# Patient Record
Sex: Female | Born: 1960 | Race: White | Hispanic: No | Smoking: Former smoker
Health system: Southern US, Community
[De-identification: ages and names within clinical notes are randomized; demographics above are authoritative.]

## PROBLEM LIST (undated history)

## (undated) DIAGNOSIS — R531 Weakness: Secondary | ICD-10-CM

## (undated) DIAGNOSIS — D649 Anemia, unspecified: Secondary | ICD-10-CM

## (undated) DIAGNOSIS — E119 Type 2 diabetes mellitus without complications: Secondary | ICD-10-CM

## (undated) DIAGNOSIS — C189 Malignant neoplasm of colon, unspecified: Secondary | ICD-10-CM

## (undated) DIAGNOSIS — M1990 Unspecified osteoarthritis, unspecified site: Secondary | ICD-10-CM

## (undated) DIAGNOSIS — K219 Gastro-esophageal reflux disease without esophagitis: Secondary | ICD-10-CM

## (undated) DIAGNOSIS — Z8709 Personal history of other diseases of the respiratory system: Secondary | ICD-10-CM

## (undated) DIAGNOSIS — E785 Hyperlipidemia, unspecified: Secondary | ICD-10-CM

## (undated) DIAGNOSIS — G8929 Other chronic pain: Secondary | ICD-10-CM

## (undated) DIAGNOSIS — I10 Essential (primary) hypertension: Secondary | ICD-10-CM

## (undated) DIAGNOSIS — K624 Stenosis of anus and rectum: Secondary | ICD-10-CM

## (undated) DIAGNOSIS — T7840XA Allergy, unspecified, initial encounter: Secondary | ICD-10-CM

## (undated) DIAGNOSIS — M549 Dorsalgia, unspecified: Secondary | ICD-10-CM

## (undated) HISTORY — PX: POLYPECTOMY: SHX149

## (undated) HISTORY — DX: Gastro-esophageal reflux disease without esophagitis: K21.9

## (undated) HISTORY — PX: TUBAL LIGATION: SHX77

## (undated) HISTORY — DX: Unspecified osteoarthritis, unspecified site: M19.90

## (undated) HISTORY — DX: Allergy, unspecified, initial encounter: T78.40XA

## (undated) HISTORY — PX: SIGMOIDOSCOPY: SUR1295

## (undated) HISTORY — PX: COLONOSCOPY: SHX174

## (undated) HISTORY — DX: Anemia, unspecified: D64.9

## (undated) HISTORY — DX: Essential (primary) hypertension: I10

---

## 1997-09-30 ENCOUNTER — Encounter: Admission: RE | Admit: 1997-09-30 | Discharge: 1997-09-30 | Payer: Self-pay | Admitting: Family Medicine

## 1998-06-19 ENCOUNTER — Encounter: Admission: RE | Admit: 1998-06-19 | Discharge: 1998-06-19 | Payer: Self-pay | Admitting: Family Medicine

## 1998-08-25 ENCOUNTER — Encounter: Admission: RE | Admit: 1998-08-25 | Discharge: 1998-08-25 | Payer: Self-pay | Admitting: Family Medicine

## 1998-10-20 ENCOUNTER — Encounter: Admission: RE | Admit: 1998-10-20 | Discharge: 1998-10-20 | Payer: Self-pay | Admitting: Family Medicine

## 1998-11-17 ENCOUNTER — Encounter: Admission: RE | Admit: 1998-11-17 | Discharge: 1998-11-17 | Payer: Self-pay | Admitting: Family Medicine

## 1998-11-24 ENCOUNTER — Encounter: Admission: RE | Admit: 1998-11-24 | Discharge: 1998-12-22 | Payer: Self-pay | Admitting: *Deleted

## 1998-12-08 ENCOUNTER — Encounter: Admission: RE | Admit: 1998-12-08 | Discharge: 1998-12-08 | Payer: Self-pay | Admitting: Family Medicine

## 1999-01-04 ENCOUNTER — Encounter: Admission: RE | Admit: 1999-01-04 | Discharge: 1999-01-04 | Payer: Self-pay | Admitting: Family Medicine

## 1999-02-08 ENCOUNTER — Encounter: Admission: RE | Admit: 1999-02-08 | Discharge: 1999-02-08 | Payer: Self-pay | Admitting: Family Medicine

## 1999-03-13 ENCOUNTER — Encounter: Admission: RE | Admit: 1999-03-13 | Discharge: 1999-03-13 | Payer: Self-pay | Admitting: Family Medicine

## 1999-04-20 ENCOUNTER — Encounter: Admission: RE | Admit: 1999-04-20 | Discharge: 1999-04-20 | Payer: Self-pay | Admitting: Family Medicine

## 1999-05-29 ENCOUNTER — Emergency Department (HOSPITAL_COMMUNITY): Admission: EM | Admit: 1999-05-29 | Discharge: 1999-05-29 | Payer: Self-pay | Admitting: Emergency Medicine

## 1999-06-01 ENCOUNTER — Encounter: Payer: Self-pay | Admitting: Sports Medicine

## 1999-06-01 ENCOUNTER — Encounter: Admission: RE | Admit: 1999-06-01 | Discharge: 1999-06-01 | Payer: Self-pay | Admitting: Sports Medicine

## 1999-08-02 ENCOUNTER — Encounter: Admission: RE | Admit: 1999-08-02 | Discharge: 1999-08-02 | Payer: Self-pay | Admitting: Family Medicine

## 1999-12-06 ENCOUNTER — Encounter: Admission: RE | Admit: 1999-12-06 | Discharge: 1999-12-06 | Payer: Self-pay | Admitting: Family Medicine

## 2000-02-07 ENCOUNTER — Encounter: Admission: RE | Admit: 2000-02-07 | Discharge: 2000-02-07 | Payer: Self-pay | Admitting: Family Medicine

## 2000-06-06 ENCOUNTER — Encounter: Admission: RE | Admit: 2000-06-06 | Discharge: 2000-06-06 | Payer: Self-pay | Admitting: Family Medicine

## 2000-06-06 ENCOUNTER — Encounter: Payer: Self-pay | Admitting: Family Medicine

## 2000-07-11 ENCOUNTER — Encounter: Admission: RE | Admit: 2000-07-11 | Discharge: 2000-07-11 | Payer: Self-pay | Admitting: Family Medicine

## 2000-07-13 ENCOUNTER — Emergency Department (HOSPITAL_COMMUNITY): Admission: EM | Admit: 2000-07-13 | Discharge: 2000-07-13 | Payer: Self-pay

## 2000-08-05 ENCOUNTER — Encounter: Admission: RE | Admit: 2000-08-05 | Discharge: 2000-08-05 | Payer: Self-pay | Admitting: Sports Medicine

## 2000-08-29 ENCOUNTER — Encounter: Admission: RE | Admit: 2000-08-29 | Discharge: 2000-08-29 | Payer: Self-pay | Admitting: Family Medicine

## 2000-09-03 ENCOUNTER — Encounter: Admission: RE | Admit: 2000-09-03 | Discharge: 2000-09-03 | Payer: Self-pay | Admitting: Family Medicine

## 2000-10-07 ENCOUNTER — Encounter: Admission: RE | Admit: 2000-10-07 | Discharge: 2000-10-07 | Payer: Self-pay | Admitting: Sports Medicine

## 2000-10-31 ENCOUNTER — Encounter: Admission: RE | Admit: 2000-10-31 | Discharge: 2000-10-31 | Payer: Self-pay | Admitting: Family Medicine

## 2000-11-04 ENCOUNTER — Emergency Department (HOSPITAL_COMMUNITY): Admission: EM | Admit: 2000-11-04 | Discharge: 2000-11-05 | Payer: Self-pay | Admitting: Emergency Medicine

## 2000-11-12 ENCOUNTER — Encounter: Payer: Self-pay | Admitting: Neurosurgery

## 2000-11-12 ENCOUNTER — Ambulatory Visit (HOSPITAL_COMMUNITY): Admission: RE | Admit: 2000-11-12 | Discharge: 2000-11-12 | Payer: Self-pay | Admitting: Neurosurgery

## 2000-11-25 ENCOUNTER — Encounter: Admission: RE | Admit: 2000-11-25 | Discharge: 2000-11-25 | Payer: Self-pay | Admitting: Pediatrics

## 2000-12-04 ENCOUNTER — Encounter: Admission: RE | Admit: 2000-12-04 | Discharge: 2000-12-04 | Payer: Self-pay | Admitting: Family Medicine

## 2000-12-25 ENCOUNTER — Encounter: Admission: RE | Admit: 2000-12-25 | Discharge: 2000-12-25 | Payer: Self-pay | Admitting: Family Medicine

## 2001-01-26 ENCOUNTER — Encounter: Admission: RE | Admit: 2001-01-26 | Discharge: 2001-01-26 | Payer: Self-pay | Admitting: Family Medicine

## 2001-02-17 ENCOUNTER — Encounter: Admission: RE | Admit: 2001-02-17 | Discharge: 2001-02-17 | Payer: Self-pay | Admitting: Family Medicine

## 2001-03-12 ENCOUNTER — Encounter: Admission: RE | Admit: 2001-03-12 | Discharge: 2001-03-12 | Payer: Self-pay | Admitting: Sports Medicine

## 2001-04-23 ENCOUNTER — Encounter: Admission: RE | Admit: 2001-04-23 | Discharge: 2001-04-23 | Payer: Self-pay | Admitting: Family Medicine

## 2001-06-12 ENCOUNTER — Encounter: Payer: Self-pay | Admitting: Family Medicine

## 2001-06-12 ENCOUNTER — Encounter: Admission: RE | Admit: 2001-06-12 | Discharge: 2001-06-12 | Payer: Self-pay | Admitting: Family Medicine

## 2001-07-23 ENCOUNTER — Encounter: Admission: RE | Admit: 2001-07-23 | Discharge: 2001-07-23 | Payer: Self-pay | Admitting: Family Medicine

## 2001-09-14 ENCOUNTER — Encounter: Admission: RE | Admit: 2001-09-14 | Discharge: 2001-09-14 | Payer: Self-pay | Admitting: Family Medicine

## 2002-09-23 ENCOUNTER — Encounter: Admission: RE | Admit: 2002-09-23 | Discharge: 2002-09-23 | Payer: Self-pay | Admitting: Family Medicine

## 2002-10-01 ENCOUNTER — Encounter: Admission: RE | Admit: 2002-10-01 | Discharge: 2002-10-01 | Payer: Self-pay | Admitting: Family Medicine

## 2002-11-10 ENCOUNTER — Encounter: Admission: RE | Admit: 2002-11-10 | Discharge: 2002-11-10 | Payer: Self-pay | Admitting: Family Medicine

## 2003-01-28 ENCOUNTER — Encounter: Admission: RE | Admit: 2003-01-28 | Discharge: 2003-01-28 | Payer: Self-pay | Admitting: Family Medicine

## 2003-07-22 ENCOUNTER — Encounter: Admission: RE | Admit: 2003-07-22 | Discharge: 2003-07-22 | Payer: Self-pay | Admitting: Family Medicine

## 2003-08-26 ENCOUNTER — Encounter: Admission: RE | Admit: 2003-08-26 | Discharge: 2003-08-26 | Payer: Self-pay | Admitting: Internal Medicine

## 2003-11-24 ENCOUNTER — Encounter: Admission: RE | Admit: 2003-11-24 | Discharge: 2003-11-24 | Payer: Self-pay | Admitting: Family Medicine

## 2004-09-28 ENCOUNTER — Ambulatory Visit: Payer: Self-pay | Admitting: Family Medicine

## 2004-10-19 ENCOUNTER — Encounter: Admission: RE | Admit: 2004-10-19 | Discharge: 2004-10-19 | Payer: Self-pay | Admitting: Sports Medicine

## 2005-09-15 ENCOUNTER — Encounter (INDEPENDENT_AMBULATORY_CARE_PROVIDER_SITE_OTHER): Payer: Self-pay | Admitting: *Deleted

## 2005-09-15 LAB — CONVERTED CEMR LAB

## 2005-09-16 ENCOUNTER — Ambulatory Visit: Payer: Self-pay | Admitting: Family Medicine

## 2005-11-01 ENCOUNTER — Encounter: Admission: RE | Admit: 2005-11-01 | Discharge: 2005-11-01 | Payer: Self-pay | Admitting: Sports Medicine

## 2006-03-25 ENCOUNTER — Ambulatory Visit: Payer: Self-pay | Admitting: Family Medicine

## 2006-08-14 DIAGNOSIS — K219 Gastro-esophageal reflux disease without esophagitis: Secondary | ICD-10-CM

## 2006-08-14 DIAGNOSIS — M545 Low back pain: Secondary | ICD-10-CM

## 2006-08-15 ENCOUNTER — Encounter (INDEPENDENT_AMBULATORY_CARE_PROVIDER_SITE_OTHER): Payer: Self-pay | Admitting: *Deleted

## 2006-09-26 ENCOUNTER — Ambulatory Visit: Payer: Self-pay | Admitting: Family Medicine

## 2006-09-26 DIAGNOSIS — E781 Pure hyperglyceridemia: Secondary | ICD-10-CM

## 2006-10-08 ENCOUNTER — Encounter: Payer: Self-pay | Admitting: Family Medicine

## 2006-10-08 ENCOUNTER — Ambulatory Visit: Payer: Self-pay | Admitting: Sports Medicine

## 2006-10-08 LAB — CONVERTED CEMR LAB
ALT: 24 units/L (ref 0–35)
AST: 16 units/L (ref 0–37)
Albumin: 4.2 g/dL (ref 3.5–5.2)
Alkaline Phosphatase: 65 units/L (ref 39–117)
BUN: 13 mg/dL (ref 6–23)
CO2: 24 meq/L (ref 19–32)
Calcium: 9.5 mg/dL (ref 8.4–10.5)
Chloride: 106 meq/L (ref 96–112)
Cholesterol: 178 mg/dL (ref 0–200)
Creatinine, Ser: 0.71 mg/dL (ref 0.40–1.20)
Glucose, Bld: 100 mg/dL — ABNORMAL HIGH (ref 70–99)
HDL: 36 mg/dL — ABNORMAL LOW (ref >39)
LDL Cholesterol: 83 mg/dL (ref 0–99)
Potassium: 4.3 meq/L (ref 3.5–5.3)
Sodium: 142 meq/L (ref 135–145)
Total Bilirubin: 0.4 mg/dL (ref 0.3–1.2)
Total CHOL/HDL Ratio: 4.9
Total Protein: 7.1 g/dL (ref 6.0–8.3)
Triglycerides: 295 mg/dL — ABNORMAL HIGH (ref <150)
VLDL: 59 mg/dL — ABNORMAL HIGH (ref 0–40)

## 2006-11-07 ENCOUNTER — Encounter: Admission: RE | Admit: 2006-11-07 | Discharge: 2006-11-07 | Payer: Self-pay | Admitting: Sports Medicine

## 2006-11-07 ENCOUNTER — Encounter: Payer: Self-pay | Admitting: Family Medicine

## 2006-12-18 ENCOUNTER — Telehealth: Payer: Self-pay | Admitting: *Deleted

## 2006-12-22 ENCOUNTER — Ambulatory Visit: Payer: Self-pay | Admitting: Sports Medicine

## 2006-12-22 DIAGNOSIS — J309 Allergic rhinitis, unspecified: Secondary | ICD-10-CM | POA: Insufficient documentation

## 2007-01-02 ENCOUNTER — Ambulatory Visit: Payer: Self-pay | Admitting: Family Medicine

## 2007-02-09 ENCOUNTER — Ambulatory Visit: Payer: Self-pay | Admitting: Family Medicine

## 2007-02-09 ENCOUNTER — Telehealth (INDEPENDENT_AMBULATORY_CARE_PROVIDER_SITE_OTHER): Payer: Self-pay | Admitting: *Deleted

## 2007-02-09 ENCOUNTER — Encounter (INDEPENDENT_AMBULATORY_CARE_PROVIDER_SITE_OTHER): Payer: Self-pay | Admitting: *Deleted

## 2007-10-01 ENCOUNTER — Encounter: Payer: Self-pay | Admitting: Family Medicine

## 2007-10-09 ENCOUNTER — Encounter: Payer: Self-pay | Admitting: Family Medicine

## 2007-10-09 ENCOUNTER — Ambulatory Visit: Payer: Self-pay | Admitting: Family Medicine

## 2007-10-09 DIAGNOSIS — D260 Other benign neoplasm of cervix uteri: Secondary | ICD-10-CM | POA: Insufficient documentation

## 2007-10-09 LAB — CONVERTED CEMR LAB
ALT: 15 units/L (ref 0–35)
AST: 15 units/L (ref 0–37)
Albumin: 3.9 g/dL (ref 3.5–5.2)
Alkaline Phosphatase: 72 units/L (ref 39–117)
BUN: 10 mg/dL (ref 6–23)
CO2: 25 meq/L (ref 19–32)
Calcium: 9.3 mg/dL (ref 8.4–10.5)
Chloride: 103 meq/L (ref 96–112)
Cholesterol: 183 mg/dL (ref 0–200)
Creatinine, Ser: 0.65 mg/dL (ref 0.40–1.20)
Glucose, Bld: 73 mg/dL (ref 70–99)
HDL: 36 mg/dL — ABNORMAL LOW (ref >39)
LDL Cholesterol: 79 mg/dL (ref 0–99)
Pap Smear: NORMAL
Potassium: 4.2 meq/L (ref 3.5–5.3)
Sodium: 138 meq/L (ref 135–145)
Total Bilirubin: 0.3 mg/dL (ref 0.3–1.2)
Total CHOL/HDL Ratio: 5.1
Total Protein: 7.2 g/dL (ref 6.0–8.3)
Triglycerides: 340 mg/dL — ABNORMAL HIGH (ref <150)
VLDL: 68 mg/dL — ABNORMAL HIGH (ref 0–40)

## 2007-10-13 ENCOUNTER — Encounter: Payer: Self-pay | Admitting: Family Medicine

## 2007-10-14 ENCOUNTER — Telehealth: Payer: Self-pay | Admitting: *Deleted

## 2007-11-12 ENCOUNTER — Encounter: Admission: RE | Admit: 2007-11-12 | Discharge: 2007-11-12 | Payer: Self-pay | Admitting: Family Medicine

## 2007-11-20 ENCOUNTER — Ambulatory Visit: Payer: Self-pay | Admitting: Family Medicine

## 2007-11-20 DIAGNOSIS — I10 Essential (primary) hypertension: Secondary | ICD-10-CM

## 2007-11-25 ENCOUNTER — Telehealth: Payer: Self-pay | Admitting: *Deleted

## 2007-11-25 ENCOUNTER — Ambulatory Visit: Payer: Self-pay | Admitting: Family Medicine

## 2007-11-25 ENCOUNTER — Encounter (INDEPENDENT_AMBULATORY_CARE_PROVIDER_SITE_OTHER): Payer: Self-pay | Admitting: *Deleted

## 2007-12-24 ENCOUNTER — Ambulatory Visit: Payer: Self-pay | Admitting: Family Medicine

## 2007-12-24 ENCOUNTER — Encounter: Payer: Self-pay | Admitting: Family Medicine

## 2007-12-24 LAB — CONVERTED CEMR LAB
BUN: 17 mg/dL (ref 6–23)
CO2: 26 meq/L (ref 19–32)
Calcium: 9.5 mg/dL (ref 8.4–10.5)
Chloride: 102 meq/L (ref 96–112)
Creatinine, Ser: 0.85 mg/dL (ref 0.40–1.20)
Glucose, Bld: 95 mg/dL (ref 70–99)
Potassium: 3.6 meq/L (ref 3.5–5.3)
Sodium: 139 meq/L (ref 135–145)

## 2008-01-07 ENCOUNTER — Telehealth: Payer: Self-pay | Admitting: *Deleted

## 2008-01-07 ENCOUNTER — Ambulatory Visit: Payer: Self-pay | Admitting: Gynecology

## 2008-01-11 ENCOUNTER — Ambulatory Visit: Payer: Self-pay | Admitting: Family Medicine

## 2008-01-11 LAB — CONVERTED CEMR LAB: Rapid Strep: NEGATIVE

## 2008-03-24 ENCOUNTER — Ambulatory Visit: Payer: Self-pay | Admitting: Family Medicine

## 2008-03-24 ENCOUNTER — Encounter: Payer: Self-pay | Admitting: Family Medicine

## 2008-03-24 LAB — CONVERTED CEMR LAB
HDL: 40 mg/dL (ref >39)
LDL Cholesterol: 99 mg/dL (ref 0–99)
Triglycerides: 176 mg/dL — ABNORMAL HIGH (ref <150)

## 2008-04-13 ENCOUNTER — Encounter: Payer: Self-pay | Admitting: Family Medicine

## 2008-05-23 ENCOUNTER — Ambulatory Visit: Payer: Self-pay | Admitting: Family Medicine

## 2008-05-23 ENCOUNTER — Encounter: Payer: Self-pay | Admitting: Family Medicine

## 2008-05-23 ENCOUNTER — Telehealth: Payer: Self-pay | Admitting: *Deleted

## 2008-07-21 ENCOUNTER — Telehealth (INDEPENDENT_AMBULATORY_CARE_PROVIDER_SITE_OTHER): Payer: Self-pay | Admitting: *Deleted

## 2008-08-18 ENCOUNTER — Encounter: Payer: Self-pay | Admitting: Family Medicine

## 2008-08-18 ENCOUNTER — Ambulatory Visit: Payer: Self-pay | Admitting: Family Medicine

## 2008-08-18 LAB — CONVERTED CEMR LAB
CO2: 25 meq/L (ref 19–32)
Calcium: 9.8 mg/dL (ref 8.4–10.5)
Chloride: 102 meq/L (ref 96–112)
Glucose, Bld: 84 mg/dL (ref 70–99)
Sodium: 140 meq/L (ref 135–145)

## 2008-08-19 ENCOUNTER — Telehealth (INDEPENDENT_AMBULATORY_CARE_PROVIDER_SITE_OTHER): Payer: Self-pay | Admitting: *Deleted

## 2008-10-27 ENCOUNTER — Ambulatory Visit: Payer: Self-pay | Admitting: Family Medicine

## 2008-10-27 ENCOUNTER — Encounter: Payer: Self-pay | Admitting: Family Medicine

## 2008-10-27 ENCOUNTER — Encounter (INDEPENDENT_AMBULATORY_CARE_PROVIDER_SITE_OTHER): Payer: Self-pay | Admitting: Family Medicine

## 2008-10-27 LAB — CONVERTED CEMR LAB
BUN: 18 mg/dL (ref 6–23)
CO2: 26 meq/L (ref 19–32)
Calcium: 9.8 mg/dL (ref 8.4–10.5)
Chloride: 103 meq/L (ref 96–112)
Creatinine, Ser: 0.79 mg/dL (ref 0.40–1.20)

## 2008-10-28 ENCOUNTER — Encounter: Payer: Self-pay | Admitting: Family Medicine

## 2008-10-31 ENCOUNTER — Encounter: Payer: Self-pay | Admitting: Family Medicine

## 2008-11-24 ENCOUNTER — Encounter: Admission: RE | Admit: 2008-11-24 | Discharge: 2008-11-24 | Payer: Self-pay | Admitting: Family Medicine

## 2009-01-02 ENCOUNTER — Ambulatory Visit: Payer: Self-pay | Admitting: Family Medicine

## 2009-01-03 ENCOUNTER — Telehealth: Payer: Self-pay | Admitting: Family Medicine

## 2009-01-05 ENCOUNTER — Telehealth: Payer: Self-pay | Admitting: Family Medicine

## 2009-04-10 ENCOUNTER — Emergency Department (HOSPITAL_COMMUNITY): Admission: EM | Admit: 2009-04-10 | Discharge: 2009-04-11 | Payer: Self-pay | Admitting: Emergency Medicine

## 2009-04-27 ENCOUNTER — Ambulatory Visit: Payer: Self-pay | Admitting: Family Medicine

## 2009-04-28 ENCOUNTER — Ambulatory Visit: Payer: Self-pay | Admitting: Family Medicine

## 2009-04-28 LAB — CONVERTED CEMR LAB
Bilirubin Urine: NEGATIVE
Glucose, Urine, Semiquant: NEGATIVE
Ketones, urine, test strip: NEGATIVE
Nitrite: NEGATIVE
Protein, U semiquant: NEGATIVE
Urobilinogen, UA: 0.2

## 2009-06-17 HISTORY — PX: ABDOMINAL HYSTERECTOMY: SHX81

## 2009-11-09 ENCOUNTER — Ambulatory Visit: Payer: Self-pay | Admitting: Family Medicine

## 2009-11-09 ENCOUNTER — Telehealth (INDEPENDENT_AMBULATORY_CARE_PROVIDER_SITE_OTHER): Payer: Self-pay | Admitting: *Deleted

## 2009-11-23 ENCOUNTER — Encounter: Payer: Self-pay | Admitting: Family Medicine

## 2009-11-23 ENCOUNTER — Ambulatory Visit: Payer: Self-pay | Admitting: Family Medicine

## 2009-11-30 ENCOUNTER — Encounter: Admission: RE | Admit: 2009-11-30 | Discharge: 2009-11-30 | Payer: Self-pay | Admitting: Family Medicine

## 2009-12-04 ENCOUNTER — Encounter: Payer: Self-pay | Admitting: Family Medicine

## 2009-12-04 LAB — CONVERTED CEMR LAB
ALT: 16 units/L (ref 0–35)
Albumin: 4.2 g/dL (ref 3.5–5.2)
CO2: 26 meq/L (ref 19–32)
Chloride: 103 meq/L (ref 96–112)
Cholesterol: 184 mg/dL (ref 0–200)
Glucose, Bld: 101 mg/dL — ABNORMAL HIGH (ref 70–99)
Potassium: 3.9 meq/L (ref 3.5–5.3)
Sodium: 140 meq/L (ref 135–145)
Total Bilirubin: 0.3 mg/dL (ref 0.3–1.2)
Total Protein: 7.1 g/dL (ref 6.0–8.3)
Triglycerides: 245 mg/dL — ABNORMAL HIGH (ref <150)
VLDL: 49 mg/dL — ABNORMAL HIGH (ref 0–40)

## 2009-12-07 ENCOUNTER — Telehealth: Payer: Self-pay | Admitting: Family Medicine

## 2010-01-29 ENCOUNTER — Telehealth: Payer: Self-pay | Admitting: Family Medicine

## 2010-01-30 ENCOUNTER — Ambulatory Visit: Payer: Self-pay | Admitting: Family Medicine

## 2010-01-30 ENCOUNTER — Encounter: Payer: Self-pay | Admitting: Family Medicine

## 2010-02-16 ENCOUNTER — Ambulatory Visit: Payer: Self-pay | Admitting: Family Medicine

## 2010-02-16 DIAGNOSIS — N950 Postmenopausal bleeding: Secondary | ICD-10-CM

## 2010-03-01 ENCOUNTER — Encounter: Payer: Self-pay | Admitting: Family Medicine

## 2010-03-01 ENCOUNTER — Ambulatory Visit: Payer: Self-pay | Admitting: Family Medicine

## 2010-03-08 ENCOUNTER — Ambulatory Visit (HOSPITAL_COMMUNITY): Admission: RE | Admit: 2010-03-08 | Discharge: 2010-03-08 | Payer: Self-pay | Admitting: Family Medicine

## 2010-03-12 ENCOUNTER — Encounter: Payer: Self-pay | Admitting: Family Medicine

## 2010-03-13 ENCOUNTER — Ambulatory Visit: Payer: Self-pay | Admitting: Family Medicine

## 2010-03-13 ENCOUNTER — Encounter: Payer: Self-pay | Admitting: *Deleted

## 2010-03-13 ENCOUNTER — Encounter: Payer: Self-pay | Admitting: Sports Medicine

## 2010-03-13 ENCOUNTER — Telehealth: Payer: Self-pay | Admitting: Family Medicine

## 2010-03-13 DIAGNOSIS — R9389 Abnormal findings on diagnostic imaging of other specified body structures: Secondary | ICD-10-CM | POA: Insufficient documentation

## 2010-03-13 LAB — CONVERTED CEMR LAB: Rapid Strep: NEGATIVE

## 2010-03-23 ENCOUNTER — Telehealth: Payer: Self-pay | Admitting: Family Medicine

## 2010-03-30 ENCOUNTER — Ambulatory Visit: Payer: Self-pay | Admitting: Family Medicine

## 2010-04-13 ENCOUNTER — Ambulatory Visit: Payer: Self-pay | Admitting: Family Medicine

## 2010-04-13 ENCOUNTER — Ambulatory Visit (HOSPITAL_COMMUNITY): Admission: RE | Admit: 2010-04-13 | Discharge: 2010-04-13 | Payer: Self-pay | Admitting: Family Medicine

## 2010-04-26 ENCOUNTER — Ambulatory Visit: Payer: Self-pay | Admitting: Family Medicine

## 2010-05-18 ENCOUNTER — Ambulatory Visit: Payer: Self-pay | Admitting: Family Medicine

## 2010-06-21 LAB — CBC
HCT: 40.9 % (ref 36.0–46.0)
Hemoglobin: 13.8 g/dL (ref 12.0–15.0)
MCH: 28.9 pg (ref 26.0–34.0)
MCHC: 33.7 g/dL (ref 30.0–36.0)
MCV: 85.7 fL (ref 78.0–100.0)
Platelets: 323 10*3/uL (ref 150–400)
RBC: 4.77 MIL/uL (ref 3.87–5.11)
RDW: 12.6 % (ref 11.5–15.5)
WBC: 10.2 10*3/uL (ref 4.0–10.5)

## 2010-06-21 LAB — BASIC METABOLIC PANEL
BUN: 13 mg/dL (ref 6–23)
CO2: 29 mEq/L (ref 19–32)
Calcium: 9.6 mg/dL (ref 8.4–10.5)
Chloride: 101 mEq/L (ref 96–112)
Creatinine, Ser: 0.53 mg/dL (ref 0.4–1.2)
GFR calc Af Amer: >60 mL/min (ref >60)
GFR calc non Af Amer: >60 mL/min (ref >60)
Glucose, Bld: 97 mg/dL (ref 70–99)
Potassium: 3.4 mEq/L — ABNORMAL LOW (ref 3.5–5.1)
Sodium: 137 mEq/L (ref 135–145)

## 2010-06-28 ENCOUNTER — Ambulatory Visit (HOSPITAL_COMMUNITY)
Admission: RE | Admit: 2010-06-28 | Discharge: 2010-06-29 | Payer: Self-pay | Source: Home / Self Care | Attending: Family Medicine | Admitting: Family Medicine

## 2010-06-28 ENCOUNTER — Encounter: Payer: Self-pay | Admitting: Family Medicine

## 2010-07-02 LAB — CBC
HCT: 35.8 % — ABNORMAL LOW (ref 36.0–46.0)
Hemoglobin: 11.6 g/dL — ABNORMAL LOW (ref 12.0–15.0)
MCH: 28.2 pg (ref 26.0–34.0)
MCHC: 32.4 g/dL (ref 30.0–36.0)
MCV: 87.1 fL (ref 78.0–100.0)
Platelets: 286 10*3/uL (ref 150–400)
RBC: 4.11 MIL/uL (ref 3.87–5.11)
RDW: 12.8 % (ref 11.5–15.5)
WBC: 12.6 10*3/uL — ABNORMAL HIGH (ref 4.0–10.5)

## 2010-07-02 LAB — PREGNANCY, URINE: Preg Test, Ur: NEGATIVE

## 2010-07-09 NOTE — Discharge Summary (Signed)
  Wanda Baker, KHALID                ACCOUNT NO.:  [id number]  MEDICAL RECORD NO.:  [id number]          PATIENT TYPE:  OIB  LOCATION:  9317                          [facility]  PHYSICIAN:  Ashelynn Marks S. Shawnie Pons, M.D.   DATE OF BIRTH:  1960/07/18  DATE OF ADMISSION:  06/28/2010 DATE OF DISCHARGE:  06/29/2010                              DISCHARGE SUMMARY   FINAL DIAGNOSES: 1. Postmenopausal bleeding secondary to large submucosal fibroid. 2. Hypertension. 3. Allergic rhinitis. 4. Elevated triglycerides. 5. Low-back pain. 6. Gastroesophageal reflux disease. 7. Arthritis.  PERTINENT LABORATORIES:  Hemoglobin of 13.8 preoperatively, 11.6 postoperatively, mildly low potassium of 3.4, normal creatinine 0.5. Pregnancy test was negative.  Other findings include a small uterus with a negative D and C at the time of hysteroscopy last month.  REASON FOR ADMISSION:  Briefly, please see H and P on the chart.  The patient is a 50 year old para 2, who was referred for postmenopausal bleeding and inability to get sample in the office.  She underwent D and C hysteroscopy in October which showed a large vascular fibroid that was not amenable to resection.  After lengthy discussion was held with the patient including risks and benefits, the patient had decided for a laparoscopic supracervical hysterectomy.  PROCEDURE:  Laparoscopic supracervical hysterectomy.  HOSPITAL COURSE:  The patient was admitted to undergo the above procedure.  Procedure went well.  Postoperatively, she was transferred to the floor.  She was afebrile.  She was voiding without difficulty. She was ambulating.  She had passed flatus.  She was tolerating regular diet on postoperative day #1.  It was felt she was stable for discharge. Pain was well controlled.  DISCHARGE DISPOSITION AND CONDITION:  The patient was discharged home in good condition.  Follow up will be in the GYN Clinic on January 26 at 2 p.m.  DISCHARGE  MEDICATIONS: 1. Resumption of lisinopril 5 mg daily. 2. Hydrochlorothiazide 25 mg daily. 3. Zyrtec 10 mg as needed for allergies. 4. Gabapentin 600 mg as needed for pain. 5. Cimetidine 400 mg twice daily. 6. Tylenol p.r.n. not to be taken with her new prescription med of     Percocet 5/325 one to two p.o. q.4-6 h. p.r.n. pain.  The patient was instructed after several days she probably can go over to ibuprofen as needed for pain and stop all Percocet.  The patient was also instructed to return with severe abdominal pain, persistent nausea, vomiting, fever, chills, especially greater than 101.  She was also instructed that right shoulder pain, this is normal.  The patient was given a prescription for Percocet #42.     Shelbie Proctor. Shawnie Pons, M.D.     TSP/MEDQ  D:  06/29/2010  T:  06/30/2010  Job:  [id number]  Electronically Signed by Tinnie Gens M.D. on 07/09/2010 12:00:51 PM

## 2010-07-09 NOTE — Op Note (Signed)
NAMEAZHA, CONSTANTIN                ACCOUNT NO.:  [id number]  MEDICAL RECORD NO.:  [id number]          PATIENT TYPE:  OIB  LOCATION:  9317                          [facility]  PHYSICIAN:  Geneveive Furness S. Shawnie Pons, M.D.   DATE OF BIRTH:  21-Feb-1961  DATE OF PROCEDURE:  06/28/2010 DATE OF DISCHARGE:                              OPERATIVE REPORT   PREOPERATIVE DIAGNOSES:  Postmenopausal bleeding and submucosal fibroid.  POSTOPERATIVE DIAGNOSES:  Postmenopausal bleeding and submucosal fibroid.  PROCEDURE:  Laparoscopic supracervical hysterectomy.  SURGEON:  Shelbie Proctor. Shawnie Pons, M.D.  ASSISTANT:  Lazaro Arms, M.D. and Allie Bossier, MD.  ANESTHESIA:  General local.  FINDINGS:  Adhesions of omentum to anterior abdominal wall and round ligament.  Otherwise, normal-appearing uterus, adhesions of the bladder to the lower uterine segment.  SPECIMENS:  Uterus and a portion of endocervix to Pathology.  ESTIMATED BLOOD LOSS:  100 mL.  COMPLICATIONS:  None known.  REASON FOR PROCEDURE:  Briefly, the patient is a 50 year old para 2 who had two prior cesarean deliveries who had postmenopausal bleeding.  She underwent D and C, hysteroscopy which showed a very large submucosal fibroid that was not amenable to hysteroscopically resection.  For that reason, she was counseled about risks and benefits and decided for supracervical hysterectomy.  The patient was also notified the risks included but were not limited to injury to bowel, bladder, ureters, bleeding, infection.  PROCEDURE:  The patient was taken to the OR where she was placed in dorsal lithotomy in [place] stirrups.  She was prepped and draped in the usual sterile fashion.  Foley catheter was placed inside her bladder.  A speculum was placed inside the vagina.  The cervix was grasped anteriorly with a single-tooth tenaculum and a cervix manipulator placed and attached to the single-tooth tenaculum.  Attention was then turned to the  abdomen.  Approximately 4 mL of 0.25% Marcaine were injected by the umbilicus, a knife was used to make an incision through the skin. The OptiVu scope with the scope attached was used to enter the peritoneal cavity.  Pneumoperitoneum was then created.  There were adhesions immediately noted that had to be gone around to look into the pelvis.  At this point, two 11-mm Xcel ports were placed in the lower quadrants bilaterally under direct visualization after injecting with 0.25% Marcaine.  These adhesions were taken down bluntly with minimal bleeding noted.  The uterus was then lifted up and a single-tooth tenaculum used on the cornu of the uterus and the harmonic scalpel used to take the patient's left round and left tubo-ovarian pedicles.  A bladder flap was created from the side.  The patient's round and tubo- ovarian pedicle were then taken down on the patient's right side and the bladder flap connected.  The bladder did not come down very well off the uterus, so care was taken to avoid being near it.  The patient's right uterine vessel was then cauterized with the harmonic scalpel and incised.  The patient's right uterine vessel was also done and cauterized with the harmonic scalpel.  The uterus was then blanched very well  and drilling started to take place on the lower uterine segment and into the endocervix.  Until the uterus was removed, several bites were taken from the patient's right followed by bites taken from the patient's left.  Once this was taken out, a little bit of the endocervix was cored out using the harmonic in a drilling-type method and this was removed.  The 11-mm port was then removed on the patient's left side and the morcellator placed through this incision.  The uterus was then morcellated and removed from the abdomen.  Any extra pieces were removed easily.  Irrigation was used on the pelvis and was felt to be hemostatic.  A piece of Interceed was placed over the  cervical stump. The morcellator was removed from thepatient's left side and an autosuture was used to close the fascial defect.  An Xcel trocar was used on the patient's right side and so the fascial defect was not  here and did not have to be closed.  The skin was closed  with 3-0 Vicryl in a subcuticular fashion in all ports. Dermabond was placed over these and then followed by sterile dressing. All instrument and lap counts were correct x2.  The single-tooth tenaculum and cervical manipulator were removed from the vagina.  The patient was awakened and taken to the recovery room in stable condition.     Shelbie Proctor. Shawnie Pons, M.D.     TSP/MEDQ  D:  06/28/2010  T:  06/29/2010  Job:  [id number]  Electronically Signed by Tinnie Gens M.D. on 07/09/2010 12:04:04 PM

## 2010-07-12 ENCOUNTER — Ambulatory Visit
Admission: RE | Admit: 2010-07-12 | Discharge: 2010-07-12 | Payer: Self-pay | Source: Home / Self Care | Attending: Family Medicine | Admitting: Family Medicine

## 2010-07-12 ENCOUNTER — Telehealth: Payer: Self-pay | Admitting: *Deleted

## 2010-07-12 LAB — SURGICAL PCR SCREEN
MRSA, PCR: NEGATIVE
Staphylococcus aureus: NEGATIVE

## 2010-07-13 NOTE — Progress Notes (Signed)
NAMEAMBERLYN, MARTINEZGARCIA NO.:  [id number]  MEDICAL RECORD NO.:  [id number]          PATIENT TYPE:  WOC  LOCATION:  [facility]                   FACILITY:  [facility]  PHYSICIAN:  Allie Bossier, MD        DATE OF BIRTH:  09-May-1961  DATE OF SERVICE:  07/12/2010                                 CLINIC NOTE  Ms. Rockford is a 50 year old lady who is now 2 weeks, status post laparoscopic supracervical hysterectomy.  She has no complaints.  Her bowel and bladder function has returned to normal.  She has quit taking her Percocet.  She does mention that she has had an increased amount of belching and she gets followed for GERD at the [facility].  On exam, her incisions are well-healed.  Her abdomen is benign.  Her speculum exam shows a normal cervix.  ASSESSMENT AND PLAN:  Postoperatively doing well.  She may return to work.  She prefers a 6 weeks postop, so I have written a note for this. She gets her Pap smears done every May with her family doctor and I recommended that she continue with annual Pap smears.     Allie Bossier, MD    MCD/MEDQ  D:  07/12/2010  T:  07/13/2010  Job:  [id number]

## 2010-07-16 ENCOUNTER — Telehealth: Payer: Self-pay | Admitting: *Deleted

## 2010-07-17 NOTE — Progress Notes (Signed)
Summary: phn msg  Phone Note Call from Patient Call back at Home Phone [phone]   Caller: Patient Summary of Call: Pt was unable to have procedure done today due to not having  co-pay.  Rescheduled for next week.  Just wanted to let Dr. Cristal Ford know. Initial call taken by: Clydell Hakim,  March 23, 2010 11:23 AM

## 2010-07-17 NOTE — Assessment & Plan Note (Signed)
Summary: vag bleeding f/u,df   Vital Signs:  Patient profile:   50 year old female Weight:      156 pounds Temp:     98.4 degrees F oral Pulse rate:   106 / minute Pulse rhythm:   regular BP sitting:   107 / 73  (left arm) Cuff size:   regular  Vitals Entered By: Loralee Pacas CMA (May 18, 2010 1:36 PM) CC: follow-up visit Is Patient Diabetic? No   Primary Provider:  Lloyd Huger MD  CC:  follow-up visit.  History of Present Illness: 1. Vaginal bleeding: F/u today for post-menopausal bleeding. Had thickened endometrial stripe per ultrasound and fibroid vs mass noted, referred to GYN and endometrial bx showed no evidence of cancer. Had D&C and hysteroscopy showing one large benign fibroid and one small polyp. Plans to undergo laparoscopic hysterectomy on Jan 12. No further bleeding since initial presentation.   2. HTN: Last visit was elevated with systolic 130s. Has been more controlled in 110s at last Gyn appt and today. Pt ascribes this to less stress since cancer ruled out.   3. Allergies: Has rhinorrhea and mildly irritated throat since weather changed. Taking OTC antihistamine. Denies fever, chills, productive cough.  Habits & Providers  Alcohol-Tobacco-Diet     Tobacco Status: quit     Tobacco Counseling: to quit use of tobacco products     Year Quit: 2009  Exercise-Depression-Behavior     Does Patient Exercise: yes     Exercise Counseling: to improve exercise regimen     Type of exercise: walking     Have you felt down or hopeless? no     Have you felt little pleasure in things? no     Depression Counseling: not indicated; screening negative for depression     Seat Belt Use: always  Allergies: 1)  ! Prednisone 2)  ! Penicillin  Past History:  Past Surgical History: Last updated: 03/24/2008 C/S and BTL 1988 MRI low back 11/00  Family History: Last updated: 03/24/2008 CAD - mat GF, pat GM (first heart attack in 50s) CHF- mat GM DM - mat GM, pat  GM HTN - mat GM Alzheimer's- Father, MGF no breast cancer or colon cancer  Social History: Last updated: 03/24/2008 lives with adult sons and mother and father divorced 16 YO son has autism smoked 1/2 ppd - quit 8/01 no ETOH.   works at [employer] to watch diet and walks on days off.  Risk Factors: Exercise: yes (05/18/2010)  Risk Factors: Smoking Status: quit (05/18/2010)  Past Medical History: post-menopausal Allergic rhinitis Chronic low back pain GERD HTN  Social History: Risk analyst Use:  always Does Patient Exercise:  yes  Review of Systems  The patient denies fever, weight loss, weight gain, chest pain, syncope, dyspnea on exertion, peripheral edema, prolonged cough, and headaches.    Physical Exam  General:  Well-developed,well-nourished,in no acute distress; alert,appropriate and cooperative throughout examination Head:  normocephalic and atraumatic.   Eyes:  PERRLA, EOMI Lungs:  Normal respiratory effort, chest expands symmetrically. Lungs are clear to auscultation, no crackles or wheezes. Heart:  Normal rate and regular rhythm. S1 and S2 normal without gallop, murmur, click, rub or other extra sounds. Neurologic:  No cranial nerve deficits noted. Station and gait are normal.  Sensory, motor and coordinative functions appear intact. Psych:  Cognition and judgment appear intact. Alert and cooperative with normal attention span and concentration. No apparent delusions, illusions, hallucinations   Impression &  Recommendations:  Problem # 1:  POSTMENOPAUSAL BLEEDING (ICD-627.1)  Secondary to fibroids. No tissue evidence of cancer or hyperplasia. Scheduled lap hysterectomy in Jan 12 with Dr. Shawnie Pons for definitive treatment. No further bleeding since D&C.   Orders: [facility]- Est Level  3 (16109)  Problem # 2:  HYPERTENSION, BENIGN ESSENTIAL (ICD-401.1)  Controlled on current medications. Will continue these.  Her updated medication list for this problem  includes:    Hydrochlorothiazide 25 Mg Tabs (Hydrochlorothiazide) .[name] Kitchen... Take 1 tablet by mouth daily    Lisinopril 5 Mg Tabs (Lisinopril) .[name] Kitchen... Take 1 tablet by mouth daily  Orders: [facility]- Est Level  3 (60454)  Problem # 3:  ALLERGIC RHINITIS (ICD-477.9) Exacerbated by dry cold weather. Cont. current management.  Her updated medication list for this problem includes:    Zyrtec Allergy 10 Mg Tabs (Cetirizine hcl) .[name] Kitchen... Take 1 tablet by mouth once a day  Problem # 4:  Preventive Health Care (ICD-V70.0) Flu shot administered. Will discuss screening colonoscopy after her surgery.   Complete Medication List: 1)  Cimetidine 400 Mg Tabs (Cimetidine) .... Take 1 tablet by mouth twice a day 2)  Neurontin 600 Mg Tabs (Gabapentin) .... Take 1 tablet by mouth every night 3)  Zyrtec Allergy 10 Mg Tabs (Cetirizine hcl) .... Take 1 tablet by mouth once a day 4)  Hydrochlorothiazide 25 Mg Tabs (Hydrochlorothiazide) .... Take 1 tablet by mouth daily 5)  Tricor 145 Mg Tabs (Fenofibrate) .[name] Kitchen.. 1 tab by mouth daily 6)  Naprosyn 500 Mg Tabs (Naproxen) .[name] Kitchen.. 1 tab by mouth two times a day w/ food 7)  Fish Oil Concentrate 1000 Mg Caps (Omega-3 fatty acids) 8)  Lisinopril 5 Mg Tabs (Lisinopril) .... Take 1 tablet by mouth daily 9)  Benzonatate 200 Mg Caps (Benzonatate) .... One tab by mouth three times a day as needed cough  Patient Instructions: 1)  Nice to see you again. 2)  Please make an appointment in 4-6 months for routine follow up unless you need one sooner.    Orders Added: 1)  [facility]- Est Level  3 [09811]

## 2010-07-17 NOTE — Assessment & Plan Note (Signed)
Summary: shoulder pain/[place]/konkol   Vital Signs:  Patient profile:   50 year old female Height:      65.5 inches Weight:      160 pounds BMI:     26.32 Temp:     97.9 degrees F oral Pulse rate:   83 / minute BP sitting:   131 / 86  (right arm) Cuff size:   regular  Vitals Entered By: Tessie Fass CMA (January 30, 2010 8:37 AM) CC: left shoulder pain Pain Assessment Patient in pain? yes     Location: left shoulder Intensity: 10   Primary Care Provider:  Lloyd Huger MD  CC:  left shoulder pain.  History of Present Illness: 50 yo female with L shoulder pain.  Present for years, located medial to left scapula, painful point.  Has had trigger pt injections years ago that helped.  Taking by mouth meds that have not really helped that much.  No radiation of pain to arm.  No change in pain through the day.  10/10 but when asked is it as bad as having a baby she says not even close (she has had 2 kids).  Current Medications (verified): 1)  Cimetidine 400 Mg Tabs (Cimetidine) .... Take 1 Tablet By Mouth Twice A Day 2)  Neurontin 600 Mg Tabs (Gabapentin) .... Take 1 Tablet By Mouth Every Night 3)  Zyrtec Allergy 10 Mg Tabs (Cetirizine Hcl) .... Take 1 Tablet By Mouth Once A Day 4)  Hydrochlorothiazide 25 Mg Tabs (Hydrochlorothiazide) .... Take 1 Tablet By Mouth Daily 5)  Tricor 145 Mg Tabs (Fenofibrate) .[name] Kitchen.. 1 Tab By Mouth Daily 6)  Naprosyn 500 Mg  Tabs (Naproxen) .[name] Kitchen.. 1 Tab By Mouth Two Times A Day W/ Food 7)  Fish Oil Concentrate 1000 Mg Caps (Omega-3 Fatty Acids) 8)  Lisinopril 5 Mg Tabs (Lisinopril) .... Take 1 Tablet By Mouth Daily  Allergies (verified): 1)  ! Prednisone 2)  ! Penicillin  Review of Systems       See HPI  Physical Exam  General:  Well-developed,well-nourished,in no acute distress; alert,appropriate and cooperative throughout examination Neck:  No deformities, masses, or tenderness noted. Lungs:  Normal respiratory effort, chest expands symmetrically.  Lungs are clear to auscultation, no crackles or wheezes. Heart:  Normal rate and regular rhythm. S1 and S2 normal without gallop, murmur, click, rub or other extra sounds. Msk:  Shoulder: L Inspection reveals no abnormalities, atrophy or asymmetry. Palpation is normal with no tenderness over AC joint or bicipital groove. ROM is full in all planes. Rotator cuff strength normal throughout. No signs of impingement with negative Neer and Hawkin's tests, empty can. Speeds and Yergason's tests normal. No labral pathology noted with negative Obrien's, negative clunk and good stability. Normal scapular function observed. No painful arc and no drop arm sign. No apprehension sign  Tender trigger point palpated on left trapezius. Additional Exam:  Consent obtained and verified. marked point of maximal tenderness with pen Sterile betadine prep. Furthur cleansed with alcohol. Topical analgesic spray: Ethyl chloride. Joint:Left trapezius trigger point. Completed without difficulty Meds:1cc lidocaine, 1cc kenalog 40 Needle:25g Aftercare instructions and Red flags advised.     Impression & Recommendations:  Problem # 1:  MUSCLE SPASM, TRAPEZIUS MUSCLE, LEFT (ICD-728.85) Assessment New Trigger point injection with immediate improvement in symptoms.  Pt declines further analgesics. Handout given.  Orders: [facility]- Est Level  3 (04540) Trigger point injection- [facility] (98119)  Complete Medication List: 1)  Cimetidine 400 Mg Tabs (Cimetidine) .... Take 1 tablet  by mouth twice a day 2)  Neurontin 600 Mg Tabs (Gabapentin) .... Take 1 tablet by mouth every night 3)  Zyrtec Allergy 10 Mg Tabs (Cetirizine hcl) .... Take 1 tablet by mouth once a day 4)  Hydrochlorothiazide 25 Mg Tabs (Hydrochlorothiazide) .... Take 1 tablet by mouth daily 5)  Tricor 145 Mg Tabs (Fenofibrate) .[name] Kitchen.. 1 tab by mouth daily 6)  Naprosyn 500 Mg Tabs (Naproxen) .[name] Kitchen.. 1 tab by mouth two times a day w/ food 7)  Fish Oil Concentrate  1000 Mg Caps (Omega-3 fatty acids) 8)  Lisinopril 5 Mg Tabs (Lisinopril) .... Take 1 tablet by mouth daily  Patient Instructions: 1)  Great to meet you today, 2)  We did a trigger point injected of a spasming muscle in your back. 3)  Come back to see me if you have any problems.   4)  Let me know if you need any further pain medicines and I'll call em in . 5)  -Dr. Karie Schwalbe.

## 2010-07-17 NOTE — Letter (Signed)
Summary: Generic Letter  [facility]  [address]   [place], [place] [address]   Phone: [phone]  Fax: [phone]    12/04/2009  [name] [address] [place], [place]  [address]  Dear [name],  I just wanted to let you know that your labs were normal EXCEPT that you sugar was a little bit high (yours was 101 and normal is up to 99).  Please call me when you get a chance so that we can discuss this.  I look forward to talking with you.       Sincerely,   Wanda Muir MD  Appended Document: Generic Letter mailed

## 2010-07-17 NOTE — Assessment & Plan Note (Signed)
Summary: cpp/tcb   Vital Signs:  Patient profile:   50 year old female Height:      65.5 inches Weight:      155.9 pounds BMI:     25.64 Temp:     98.3 degrees F oral Pulse rate:   86 / minute BP sitting:   116 / 75  (left arm) Cuff size:   regular  Vitals Entered By: Gladstone Pih (Nov 09, 2009 1:42 PM) CC: htn, back pain, hypertriglyceridemia Is Patient Diabetic? No Pain Assessment Patient in pain? no        Primary Care Provider:  Asher Muir MD  CC:  htn, back pain, and hypertriglyceridemia.  History of Present Illness: 1.  hypertension--great control on hctz and lisinopril.  due for labs  2.  lower back pain--past few days.  radiates down lateral leg.  no numbness.  no known injury or heavy lifting.  no weakness.  has had mri years ago and says it showed arthritis and maybe a bulging disc.  no groin anesthesia.  no personal hx of cancer  3.  hypertriglyceridemia--due for FLP, but she has eaten today.  on tricor.    Habits & Providers  Alcohol-Tobacco-Diet     Tobacco Status: quit     Tobacco Counseling: to quit use of tobacco products     Year Quit: 2009  Current Medications (verified): 1)  Cimetidine 400 Mg Tabs (Cimetidine) .... Take 1 Tablet By Mouth Twice A Day 2)  Neurontin 600 Mg Tabs (Gabapentin) .... Take 1 Tablet By Mouth Every Night 3)  Zyrtec Allergy 10 Mg Tabs (Cetirizine Hcl) .... Take 1 Tablet By Mouth Once A Day 4)  Hydrochlorothiazide 25 Mg Tabs (Hydrochlorothiazide) .... Take 1 Tablet By Mouth Daily 5)  Tricor 145 Mg Tabs (Fenofibrate) .[name] Kitchen.. 1 Tab By Mouth Daily 6)  Naprosyn 500 Mg  Tabs (Naproxen) .[name] Kitchen.. 1 Tab By Mouth Two Times A Day W/ Food 7)  Fish Oil Concentrate 1000 Mg Caps (Omega-3 Fatty Acids) 8)  Lisinopril 5 Mg Tabs (Lisinopril) .... Take 1 Tablet By Mouth Daily  Allergies: 1)  ! Prednisone 2)  ! Penicillin  Social History: Smoking Status:  quit  Review of Systems General:  Denies weight loss. CV:  Denies chest pain or  discomfort and swelling of feet. MS:  Complains of low back pain; denies loss of strength, mid back pain, and stiffness.  Physical Exam  General:  Well-developed,well-nourished,in no acute distress; alert,appropriate and cooperative throughout examination Lungs:  Normal respiratory effort, chest expands symmetrically. Lungs are clear to auscultation, no crackles or wheezes. Heart:  Normal rate and regular rhythm. S1 and S2 normal without gallop, murmur, click, rub or other extra sounds.   Detailed Back/Spine Exam  General:    Well-developed, well-nourished, in no acute distress; alert and oriented x 3.    Gait:    Normal heel-toe gait pattern bilaterally.    Palpation:    no ttp over l-spine or paraspinous muscles.  no sciatic notch or SI joint tenderness  Lumbosacral Exam:  Range of Motion:    Forward Flexion:   85 degrees    Hyperextension:   25 degrees    Right Lateral Bend:   30 degrees    Left Lateral Bend:   30 degrees Squatting:      can squat about 60% of the way down Sciatic Notch:    There is no sciatic notch tenderness.     has very mild back pain with left straight leg  raise, both sitting and supine.  has pain with heel and toe standing.  has some subtle quad weakness on left that seems to be secondary to pain.  other major muscle groups on left have full strength.  strength is full on the right.  normal patellar and ankle reflexes bilaterally   Impression & Recommendations:  Problem # 1:  HYPERTENSION, BENIGN ESSENTIAL (ICD-401.1) Assessment Unchanged great control.  no changes.  check labs Her updated medication list for this problem includes:    Hydrochlorothiazide 25 Mg Tabs (Hydrochlorothiazide) .[name] Kitchen... Take 1 tablet by mouth daily    Lisinopril 5 Mg Tabs (Lisinopril) .[name] Kitchen... Take 1 tablet by mouth daily  Orders: Comp Met-[facility] ([address]) [facility]- Est  Level 4 ([address])  Problem # 2:  HYPERGLYCERIDEMIA, PURE (ICD-272.1) Assessment: Unchanged check flp at lab  visit Her updated medication list for this problem includes:    Tricor 145 Mg Tabs (Fenofibrate) .[name] Kitchen... 1 tab by mouth daily  Orders: Lipid-[facility] ([address]) [facility]- Est  Level 4 ([address])  Problem # 3:  BACK PAIN, LOW (ICD-724.2) Assessment: Deteriorated  does have some mild radicular symptoms, but no red flags.  will treat conservatively with diclofenac (which has worked well in the past) and exercises.  gave handout on low back pain.  rtc if not better in 2 weeks. Her updated medication list for this problem includes:    Naprosyn 500 Mg Tabs (Naproxen) .[name] Kitchen... 1 tab by mouth two times a day w/ food  Orders: [facility]- Est  Level 4 ([address])  Complete Medication List: 1)  Cimetidine 400 Mg Tabs (Cimetidine) .... Take 1 tablet by mouth twice a day 2)  Neurontin 600 Mg Tabs (Gabapentin) .... Take 1 tablet by mouth every night 3)  Zyrtec Allergy 10 Mg Tabs (Cetirizine hcl) .... Take 1 tablet by mouth once a day 4)  Hydrochlorothiazide 25 Mg Tabs (Hydrochlorothiazide) .... Take 1 tablet by mouth daily 5)  Tricor 145 Mg Tabs (Fenofibrate) .[name] Kitchen.. 1 tab by mouth daily 6)  Naprosyn 500 Mg Tabs (Naproxen) .[name] Kitchen.. 1 tab by mouth two times a day w/ food 7)  Fish Oil Concentrate 1000 Mg Caps (Omega-3 fatty acids) 8)  Lisinopril 5 Mg Tabs (Lisinopril) .... Take 1 tablet by mouth daily  Patient Instructions: 1)  It was nice to see you today. 2)  Make a lab appointment in the next few weeks.  Don't eat for 8 hours before the appointment. 3)  I'll call you with results. 4)  For you back, I refilled your naprosyn.  Try the exercises I gave you. 5)  Make a follow up appointment if your back is not better in 2 weeks. 6)  Please schedule a follow-up appointment in 6 months for your blood pressure (October).  7)  Don't forget to get your mammogram. Prescriptions: NAPROSYN 500 MG  TABS (NAPROXEN) 1 tab by mouth two times a day w/ food  #60 x 0   Entered and Authorized by:   Asher Muir MD   Signed by:   Asher Muir MD on 11/09/2009   Method used:   Electronically to        [facility] [id number]* (retail)       [address] [place], [place]  [address]       Ph: [phone]       Fax: [phone]   RxID:   [id number]   Prevention & Chronic Care  Immunizations   Influenza vaccine: Fluvax Non-MCR  (04/27/2009)   Influenza vaccine due: 03/24/2009    Tetanus booster: 09/19/2002: Done.   Tetanus booster due: 09/18/2012    Pneumococcal vaccine: Not documented  Other Screening   Pap smear: NEGATIVE FOR INTRAEPITHELIAL LESIONS OR MALIGNANCY.  (10/27/2008)   Pap smear due: 10/28/2011    Mammogram: Normal  (11/25/2008)   Mammogram action/deferral: Negative Screening mammogram in 1 year.     (11/07/2006)   Mammogram due: 11/25/2009   Smoking status: quit  (11/09/2009)  Lipids   Total Cholesterol: 174  (03/24/2008)   LDL: 99  (03/24/2008)   LDL Direct: Not documented   HDL: 40  (03/24/2008)   Triglycerides: 176  (03/24/2008)    SGOT (AST): 15  (10/09/2007)   SGPT (ALT): 15  (10/09/2007) CMP ordered    Alkaline phosphatase: 72  (10/09/2007)   Total bilirubin: 0.3  (10/09/2007)    Lipid flowsheet reviewed?: Yes   Progress toward LDL goal: Unchanged  Hypertension   Last Blood Pressure: 116 / 75  (11/09/2009)   Serum creatinine: 0.79  (10/27/2008)   Serum potassium 4.0  (10/27/2008) CMP ordered     Hypertension flowsheet reviewed?: Yes   Progress toward BP goal: At goal  Self-Management Support :   Personal Goals (by the next clinic visit) :      Personal blood pressure goal: 140/90  (11/09/2009)     Personal LDL goal: 130  (11/09/2009)    Hypertension self-management support: Not documented    Hypertension self-management support not done because: Good outcomes  (04/27/2009)    Lipid self-management support: Not documented

## 2010-07-17 NOTE — Progress Notes (Signed)
Summary: Rx Prob  Phone Note Call from Patient   Caller: Patient Summary of Call: Says [facility] says they have not recieved rx yet from today. Initial call taken by: Clydell Hakim,  Nov 09, 2009 3:30 PM  Follow-up for Phone Call        called pharmacy and they do have rx, patient notified. Follow-up by: Theresia Lo RN,  Nov 09, 2009 4:23 PM

## 2010-07-17 NOTE — Assessment & Plan Note (Signed)
Summary: ENDOMETRIAL BIOPSY/KH   Vital Signs:  Patient profile:   50 year old female Height:      65.5 inches Weight:      157.9 pounds BMI:     25.97 Temp:     98.4 degrees F oral Pulse rate:   90 / minute BP sitting:   132 / 85  (left arm) Cuff size:   regular  Vitals Entered By: Jone Baseman CMA (March 01, 2010 9:46 AM) CC: endometrial biopsy   Primary Care Provider:  Lloyd Huger MD  CC:  endometrial biopsy.  History of Present Illness: Had no vaginal bleeding for about a year, now haing some intermittent spotting for several days. No abdominal pain or crampping. No fever. Not on HRT/ ERT?.  Current Medications (verified): 1)  Cimetidine 400 Mg Tabs (Cimetidine) .... Take 1 Tablet By Mouth Twice A Day 2)  Neurontin 600 Mg Tabs (Gabapentin) .... Take 1 Tablet By Mouth Every Night 3)  Zyrtec Allergy 10 Mg Tabs (Cetirizine Hcl) .... Take 1 Tablet By Mouth Once A Day 4)  Hydrochlorothiazide 25 Mg Tabs (Hydrochlorothiazide) .... Take 1 Tablet By Mouth Daily 5)  Tricor 145 Mg Tabs (Fenofibrate) .[name] Kitchen.. 1 Tab By Mouth Daily 6)  Naprosyn 500 Mg  Tabs (Naproxen) .[name] Kitchen.. 1 Tab By Mouth Two Times A Day W/ Food 7)  Fish Oil Concentrate 1000 Mg Caps (Omega-3 Fatty Acids) 8)  Lisinopril 5 Mg Tabs (Lisinopril) .... Take 1 Tablet By Mouth Daily  Allergies: 1)  ! Prednisone 2)  ! Penicillin  Past History:  Past Medical History: Reviewed history from 04/27/2009 and no changes required. menopausal Allergic rhinitis Chronic low back pain GERD HTN  Past Surgical History: Reviewed history from 03/24/2008 and no changes required. C/S and BTL 1988 MRI low back 11/00  Review of Systems GU:  Denies dysuria, genital sores, hematuria, incontinence, urinary frequency, and urinary hesitancy.  Physical Exam  General:  alert and well-developed.   Genitalia:  externally atrophic genitalia Bimanual exam reveals normal sized uterus and no adnexal masses Additional Exam:  Patient given  informed consent, signed copy in the chart. Placed in lithotomy position. Cervix viewed with sterile speculum. The portio of the cervix was cleansed with individual betadine swabs. The cervix was secured with a tenaculum placed in the anterior lip. The uterine sound could not be introduced into the cervix sue to a stenotic os. Several attemots were made without success. All equipment was removed and the procedure terminated.    Impression & Recommendations:  Problem # 1:  POSTMENOPAUSAL BLEEDING (ICD-627.1)  Orders: Endometrial/Endocerv BX- [facility] ([address]100) [facility]- Est Level  3 (91478) Ultrasound (Ultrasound) attempted endometrial biopsy--will send to GYN as we were unsuccessful. Will also get PUS  Complete Medication List: 1)  Cimetidine 400 Mg Tabs (Cimetidine) .... Take 1 tablet by mouth twice a day 2)  Neurontin 600 Mg Tabs (Gabapentin) .... Take 1 tablet by mouth every night 3)  Zyrtec Allergy 10 Mg Tabs (Cetirizine hcl) .... Take 1 tablet by mouth once a day 4)  Hydrochlorothiazide 25 Mg Tabs (Hydrochlorothiazide) .... Take 1 tablet by mouth daily 5)  Tricor 145 Mg Tabs (Fenofibrate) .[name] Kitchen.. 1 tab by mouth daily 6)  Naprosyn 500 Mg Tabs (Naproxen) .[name] Kitchen.. 1 tab by mouth two times a day w/ food 7)  Fish Oil Concentrate 1000 Mg Caps (Omega-3 fatty acids) 8)  Lisinopril 5 Mg Tabs (Lisinopril) .... Take 1 tablet by mouth daily  Appended Document: ENDOMETRIAL BIOPSY/KH Appt with [facility] clinics on  04/26/10 at 145pm, patient informed

## 2010-07-17 NOTE — Miscellaneous (Signed)
Summary: Consent: Endometrial Biopsy  Consent: Endometrial Biopsy   Imported By: Knox Royalty 03/06/2010 13:15:53  _____________________________________________________________________  External Attachment:    Type:   Image     Comment:   External Document

## 2010-07-17 NOTE — Assessment & Plan Note (Signed)
Summary: spotting/no period for a year/bmc   Vital Signs:  Patient profile:   50 year old female Height:      65.5 inches Weight:      156 pounds BMI:     25.66 Temp:     98.6 degrees F oral Pulse rate:   84 / minute BP sitting:   128 / 86  (right arm) Cuff size:   regular CC: went through menopause now spotting Is Patient Diabetic? No Pain Assessment Patient in pain? no        Primary Taras Rask:  Lloyd Huger MD  CC:  went through menopause now spotting.  History of Present Illness: Vaginal Bleeding: 50 yo has gone about one year without any vaginal bleeding or periods. Now has some spotting for past 3 days. Small quantity of blood. Denies menstrual cramping, vaginal pain, itching, discharge. No trauma, not sexually active. No recent medication changes. Per pt, has hx of a "uterine cyst" picked up by a pap smear, but we have 2 normal cytology specimens from 2009-2010. Had 2 children and tubal ligation.    Allergies: 1)  ! Prednisone 2)  ! Penicillin  Past History:  Past Medical History: Last updated: 04/27/2009 menopausal Allergic rhinitis Chronic low back pain GERD HTN  Past Surgical History: Last updated: 03/24/2008 C/S and BTL 1988 MRI low back 11/00  Family History: Last updated: 03/24/2008 CAD - mat GF, pat GM (first heart attack in 50s) CHF- mat GM DM - mat GM, pat GM HTN - mat GM Alzheimer's- Father, MGF no breast cancer or colon cancer  Social History: Last updated: 03/24/2008 lives with adult sons and mother and father divorced 50 YO son has autism smoked 1/2 ppd - quit 8/01 no ETOH.   works at [employer] to watch diet and walks on days off.  Review of Systems  The patient denies anorexia, fever, weight loss, weight gain, abdominal pain, hematuria, incontinence, genital sores, and suspicious skin lesions.    Physical Exam  General:  Well-developed,well-nourished,in no acute distress; alert,appropriate and cooperative throughout  examination Head:  normocephalic and atraumatic.   Lungs:  Normal respiratory effort, chest expands symmetrically. Lungs are clear to auscultation, no crackles or wheezes. Heart:  Normal rate and regular rhythm. S1 and S2 normal without gallop, murmur, click, rub or other extra sounds. Abdomen:  Bowel sounds positive,abdomen soft and non-tender without masses, organomegaly or hernias noted. Genitalia:  Normal introitus for age, no external lesions, no vaginal discharge, mucosa pink and moist, no vaginal lesions or vaginal atrophy.    Impression & Recommendations:  Problem # 1:  POSTMENOPAUSAL BLEEDING (ICD-627.1) Upreg negative. Will schedule endometrial biopsy in women's health clinic to r/o cancer and f/u with pathology results. Could also be atrophy vs. perimenopausal bleeding vs. polyps vs. cervical (last 2 paps negative).  No obvious external causes for bleeding on exam. If results are negative will discuss further treatment with patient.   Orders: U Preg-[facility] (81025) [facility]- Est Level  3 ([address])  Complete Medication List: 1)  Cimetidine 400 Mg Tabs (Cimetidine) .... Take 1 tablet by mouth twice a day 2)  Neurontin 600 Mg Tabs (Gabapentin) .... Take 1 tablet by mouth every night 3)  Zyrtec Allergy 10 Mg Tabs (Cetirizine hcl) .... Take 1 tablet by mouth once a day 4)  Hydrochlorothiazide 25 Mg Tabs (Hydrochlorothiazide) .... Take 1 tablet by mouth daily 5)  Tricor 145 Mg Tabs (Fenofibrate) .[name] Kitchen.. 1 tab by mouth daily 6)  Naprosyn  500 Mg Tabs (Naproxen) .[name] Kitchen.. 1 tab by mouth two times a day w/ food 7)  Fish Oil Concentrate 1000 Mg Caps (Omega-3 fatty acids) 8)  Lisinopril 5 Mg Tabs (Lisinopril) .... Take 1 tablet by mouth daily  Patient Instructions: 1)  Nice to meet you. 2)  Please schedule appointment in [facility] health for endometrial biopsy. 3)  We will discuss further plans after the biopsy. 4)  Please call clinic with any questions or concerns [phone].  Laboratory Results   Urine  Tests  Date/Time Received: February 16, 2010 10:22 AM  Date/Time Reported: February 16, 2010 10:37 AM     Urine HCG: negative Comments: ...........test performed by...........[name] KitchenTerese Door, CMA

## 2010-07-17 NOTE — Progress Notes (Signed)
Summary: phn msg  Phone Note Call from Patient Call back at Home Phone [phone]   Caller: Patient Summary of Call: pt returned call Initial call taken by: De Nurse,  December 07, 2009 3:40 PM  Follow-up for Phone Call        discussed slightly elevated sugar of 101 on last fasting labs.  just wanted to make her aware.  we will continue to monitor and perhaps check an A1C Follow-up by: Asher Muir MD,  December 07, 2009 3:57 PM

## 2010-07-17 NOTE — Miscellaneous (Signed)
Summary: Procedure Consent  Procedure Consent   Imported By: Bradly Bienenstock 01/30/2010 16:39:55  _____________________________________________________________________  External Attachment:    Type:   Image     Comment:   External Document

## 2010-07-17 NOTE — Letter (Signed)
Summary: Out of Work  [facility]  [address]   [place], [place] [address]   Phone: [phone]  Fax: [phone]    March 13, 2010   Employee:  RONIE FLEEGER    To Whom It May Concern:   For Medical reasons, please excuse the above named employee from work for the following dates:  Start:   03/13/10  End:   03/13/10  If you need additional information, please feel free to contact our office.         Sincerely,    Rodney Langton MD

## 2010-07-17 NOTE — Progress Notes (Signed)
Summary: triage  Phone Note Call from Patient Call back at Home Phone [phone]   Caller: Patient Summary of Call: shoulder pain and wants to come in today Initial call taken by: De Nurse,  January 29, 2010 8:40 AM  Follow-up for Phone Call        L shoulder pain. hx of this. unable to come in today . appt at 8:30am Tues at pt request Follow-up by: Golden Circle RN,  January 29, 2010 8:56 AM

## 2010-07-17 NOTE — Letter (Signed)
Summary: PUS Letter  [facility]  [address]   [place], [place] [address]   Phone: [phone]  Fax: [phone]    03/12/2010  Wanda Baker [address] [place], [place]  [address]  Dear Ms. Hadden,  Your pelvic ultrasound showed some abnormalities in the  endometrial lining. I would recommmend you keep   the appointment wit the GYN clinic.   Please call me with questions.        Sincerely,   Denny Levy MD  Appended Document: PUS Letter mailed

## 2010-07-17 NOTE — Progress Notes (Signed)
Summary: triage  Phone Note Call from Patient Call back at Home Phone [phone]   Caller: Patient Summary of Call: Pt with cough and congestion worse over night.  Can she be seen today? Initial call taken by: Clydell Hakim,  March 13, 2010 8:41 AM  Follow-up for Phone Call        she took a work in at 3 today. refused UC. aware of wait Follow-up by: Golden Circle RN,  March 13, 2010 9:11 AM

## 2010-07-17 NOTE — Assessment & Plan Note (Signed)
Summary: cpough & congestion/[place]   Vital Signs:  Patient profile:   50 year old female Height:      65.5 inches Weight:      157 pounds BMI:     25.82 Temp:     98.2 degrees F oral Pulse rate:   105 / minute BP sitting:   139 / 85  (left arm) Cuff size:   regular  Vitals Entered By: Jimmy Footman, CMA (March 13, 2010 3:07 PM) CC: laryngitis x 2 days. Dry cough x 1 day Is Patient Diabetic? No   Primary Care Provider:  Lloyd Huger MD  CC:  laryngitis x 2 days. Dry cough x 1 day.  History of Present Illness: 8F  URI Symptoms Onset: Cough x 1d, dry.  Hoarse voice x 2d.   Description: Cough x 1d, dry.  Hoarse voice x 2d.    Symptoms Nasal discharge: Clear Fever: No Sore throat: Mild Cough: Dry. Wheezing: No Ear pain: No GI symptoms: No Sick contacts: No  Red Flags  Stiff neck: No Dyspnea: No Rash: No Swallowing difficulty: No  Sinusitis Risk Factors Headache/face pain: No Double sickening: No tooth pain: No  Allergy Risk Factors Sneezing: No Itchy scratchy throat: No Seasonal symptoms: No  Flu Risk Factors Headache: No muscle aches: No severe fatigue: No    Habits & Providers  Alcohol-Tobacco-Diet     Tobacco Status: quit  Current Medications (verified): 1)  Cimetidine 400 Mg Tabs (Cimetidine) .... Take 1 Tablet By Mouth Twice A Day 2)  Neurontin 600 Mg Tabs (Gabapentin) .... Take 1 Tablet By Mouth Every Night 3)  Zyrtec Allergy 10 Mg Tabs (Cetirizine Hcl) .... Take 1 Tablet By Mouth Once A Day 4)  Hydrochlorothiazide 25 Mg Tabs (Hydrochlorothiazide) .... Take 1 Tablet By Mouth Daily 5)  Tricor 145 Mg Tabs (Fenofibrate) .[name] Kitchen.. 1 Tab By Mouth Daily 6)  Naprosyn 500 Mg  Tabs (Naproxen) .[name] Kitchen.. 1 Tab By Mouth Two Times A Day W/ Food 7)  Fish Oil Concentrate 1000 Mg Caps (Omega-3 Fatty Acids) 8)  Lisinopril 5 Mg Tabs (Lisinopril) .... Take 1 Tablet By Mouth Daily 9)  Benzonatate 200 Mg Caps (Benzonatate) .... One Tab By Mouth Three Times A Day As  Needed Cough  Allergies (verified): 1)  ! Prednisone 2)  ! Penicillin  Review of Systems       See HPI  Physical Exam  General:  Well-developed,well-nourished,in no acute distress; alert,appropriate and cooperative throughout examination Head:  Normocephalic and atraumatic without obvious abnormalities.  Eyes:  No corneal or conjunctival inflammation noted. EOMI. Perrla.  Ears:  External ear exam shows no significant lesions or deformities.  Otoscopic examination reveals clear canals, tympanic membranes are intact bilaterally without bulging, retraction, inflammation or discharge. Hearing is grossly normal bilaterally. Nose:  External nasal examination shows no deformity or inflammation. Nasal mucosa are pink and moist without lesions or exudates. Mouth:  throat moist, very erythematous oropharynx, no exudates. Neck:  No deformities, masses, or tenderness noted. Lungs:  Normal respiratory effort, chest expands symmetrically. Lungs are clear to auscultation, no crackles or wheezes. Heart:  Normal rate and regular rhythm. S1 and S2 normal without gallop, murmur, click, rub or other extra sounds.   Impression & Recommendations:  Problem # 1:  SORE THROAT (ICD-462) Assessment New Symptoms suggestive of laryngitis. Strep neg. Likely viral, will break cough, pharyngeal irritation, cough cycle with Benzonatate. Home care handout given for symptoms control. RTC 2-3 wks if not better.  Her updated medication list for  this problem includes:    Naprosyn 500 Mg Tabs (Naproxen) .[name] Kitchen... 1 tab by mouth two times a day w/ food  Orders: Rapid Strep-[facility] ([address]) [facility]- Est Level  3 ([address])  Complete Medication List: 1)  Cimetidine 400 Mg Tabs (Cimetidine) .... Take 1 tablet by mouth twice a day 2)  Neurontin 600 Mg Tabs (Gabapentin) .... Take 1 tablet by mouth every night 3)  Zyrtec Allergy 10 Mg Tabs (Cetirizine hcl) .... Take 1 tablet by mouth once a day 4)  Hydrochlorothiazide 25 Mg Tabs  (Hydrochlorothiazide) .... Take 1 tablet by mouth daily 5)  Tricor 145 Mg Tabs (Fenofibrate) .[name] Kitchen.. 1 tab by mouth daily 6)  Naprosyn 500 Mg Tabs (Naproxen) .[name] Kitchen.. 1 tab by mouth two times a day w/ food 7)  Fish Oil Concentrate 1000 Mg Caps (Omega-3 fatty acids) 8)  Lisinopril 5 Mg Tabs (Lisinopril) .... Take 1 tablet by mouth daily 9)  Benzonatate 200 Mg Caps (Benzonatate) .... One tab by mouth three times a day as needed cough  Patient Instructions: 1)  Great to see you, 2)  I agree, i think you have pharyngitis. 3)  Benzonatate for cough. 4)  Come back if no better in 2-3 weeks. 5)  -Dr. Karie Schwalbe. Prescriptions: BENZONATATE 200 MG CAPS (BENZONATATE) One tab by mouth three times a day as needed cough  #42 x 0   Entered and Authorized by:   Rodney Langton MD   Signed by:   Rodney Langton MD on 03/13/2010   Method used:   Electronically to        [facility] [address] (retail)       [address] [place], [place]  [address]       Ph: [phone]       Fax: [phone]   RxID:   [id number]   Laboratory Results  Date/Time Received: March 13, 2010 3:38 PM  Date/Time Reported: March 13, 2010 3:46 PM   Other Tests  Rapid Strep: negative Comments: ...............test performed by......[name] KitchenBonnie A. [place], MLS (ASCP)cm

## 2010-07-17 NOTE — Miscellaneous (Signed)
Summary: REFERRAL TO GYN  Clinical Lists Changes Asha given the rather abnormal PUS results, I think we need to move her appointment up--she cannot wait until NOVEMBER> I have done a lette in the referral and let's fax that and the office note and the PUS results and ask them to see if they can move it up--this lady may have an endometrial cancer--she needs to be seen innext 2-3 weeks. If [facility] cannot get her in let me know--we may have to try Dr Tamela Oddi or I may hav eto call someone. Thanks!  Denny Levy MD  March 13, 2010 10:05 AM  Current referral letter and U/S results faxed to [facility] clinics for sooner appt, will call later and check status...............................................[name] KitchenGaren Grams LPN March 13, 2010 11:11 AM  Patient has been rescheduled at [facility] clinics for 03/23/10 at 1015am. Is this ok or do we need to send her somewhere else?..............................................[name] KitchenGaren Grams LPN March 14, 2010 12:12 PM  this is OK Thanks!  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 Take 1 tablet by mouth twice a day 2)  Neurontin 600 Mg Tabs (Gabapentin) .... Take 1 tablet by mouth every night 3)  Zyrtec Allergy 10 Mg Tabs (Cetirizine hcl) .... Take 1 tablet by mouth once a day 4)  Hydrochlorothiazide 25 Mg Tabs (Hydrochlorothiazide) .... Take 1 tablet by mouth daily 5)  Tricor 145 Mg Tabs (Fenofibrate) .[name] Kitchen.. 1 tab by mouth daily 6)  Naprosyn 500 Mg Tabs (Naproxen) .[name] Kitchen.. 1 tab by mouth two times a day w/ food 7)  Fish Oil Concentrate 1000 Mg Caps (Omega-3 fatty acids) 8)  Lisinopril 5 Mg Tabs (Lisinopril) .... Take 1 tablet by mouth daily

## 2010-07-17 NOTE — Letter (Signed)
Summary: Handout Printed  Printed Handout:  - Laryngitis 

## 2010-07-19 NOTE — Progress Notes (Signed)
Summary: Phn Msg  Phone Note Call from Patient Call back at Home Phone [phone]   Summary of Call: pt had a hysterectomy by Dr. Shawnie Pons on 06/28/10, was given percocet but pt told MD it would aggravate her acid reflux, was told to try it & take her acid reflux medication, pt did & its still not helping, please advise.  Initial call taken by: Knox Royalty,  July 12, 2010 2:50 PM    New/Updated Medications: NEXIUM 40 MG CPDR (ESOMEPRAZOLE MAGNESIUM) One by mouth once daily Prescriptions: NEXIUM 40 MG CPDR (ESOMEPRAZOLE MAGNESIUM) One by mouth once daily  #60 x 0   Entered by:   Dennison Nancy RN   Authorized by:   Tinnie Gens MD   Signed by:   Dennison Nancy RN on 07/12/2010   Method used:   Electronically to        [facility] [id number] (retail)       [address] [place], [place]  [address]       Ph: [phone]       Fax: [phone]   RxID:   [id number]  Patient advised to stop taking the Cimetidine.  Dennison Nancy RN  July 12, 2010 3:56 PM

## 2010-07-25 NOTE — Progress Notes (Signed)
Summary: phn msg  Phone Note Call from Patient Call back at Home Phone [phone]   Caller: Patient Summary of Call: has a question about the nexium she is taking - wants to know if she can take it more than once a day Initial call taken by: De Nurse,  July 16, 2010 3:31 PM  Follow-up for Phone Call        Told pt it was ok for her to try twice a day but only for a short period.  If no relief by next week to call us back. Follow-up by: Dennison Nancy RN,  July 16, 2010 3:33 PM

## 2010-07-27 ENCOUNTER — Encounter: Payer: Self-pay | Admitting: Family Medicine

## 2010-08-06 ENCOUNTER — Telehealth: Payer: Self-pay | Admitting: Family Medicine

## 2010-08-06 ENCOUNTER — Ambulatory Visit (INDEPENDENT_AMBULATORY_CARE_PROVIDER_SITE_OTHER): Payer: BC Managed Care – PPO | Admitting: Family Medicine

## 2010-08-06 VITALS — BP 133/87 | HR 88 | Temp 97.8°F | Wt 157.0 lb

## 2010-08-06 DIAGNOSIS — J069 Acute upper respiratory infection, unspecified: Secondary | ICD-10-CM

## 2010-08-06 MED ORDER — BENZONATATE 200 MG PO CAPS: 200.0000 mg | ORAL_CAPSULE | Freq: Three times a day (TID) | ORAL | Status: DC | PRN

## 2010-08-06 NOTE — Patient Instructions (Signed)
Start the tessalon perrles as needed for cough Use nasal saline twice a day as needed for congestion Continue Zyrtec Drink plenty of fluids If you have vomiting or diarrhea, then stop the Lisinopril If you do not improve or worsen before the weekend- come back for a recheck

## 2010-08-06 NOTE — Assessment & Plan Note (Signed)
Will treat symptoms, start tessalon perrles which has worked in the past, nasal saline, no fever and no red flags on exam today. Return if no improvement, no antibiotics needed today, viral illness.

## 2010-08-06 NOTE — Telephone Encounter (Signed)
She called back asking where the rx was. Her pharmacy told her it had not been sent. I spoke with md who states she called it in 10 minutes ago as the e rx was not functioning correctly. Called pt to let her know this but phone call did not go thru.

## 2010-08-06 NOTE — Progress Notes (Signed)
  Subjective:    Patient ID: Wanda Baker, female    DOB: April 01, 1961, 50 y.o.   MRN: [id number]  HPI  Friday morning, starting having sinus headache, Sat and Sunday- started having sore throat and cough- cough has mild production, low grade fever, no emesis, no diarrhea, +body aches, pt did receive flu shot , good appetite, +nasal congestion No sick contacts  Tried Coricidin and Zryrtec    No chest pain no SOB    Review of Systems --per above     Objective:   Physical Exam Gen- , NAD, alert and oriented , pleasant, not ill appearing HEENT- Right TM- clear, no erythema, left TM ? Small clear fluid noted, no erythema to TM, canal mild erythema  Small shotty node left anterior chain, MMM, mild erythema oropharynx, no exudates seen, tonsils 1+, Pressure over ethmoid sinus, no frontal or maxillary tenderness CVS- RRR, no murmur RESP- CTAB, coughing during exam          Assessment & Plan:

## 2010-08-06 NOTE — Telephone Encounter (Signed)
Started fri am with a sinus HA. Worse over the weekend. Cough & sore throat. Felt feverish over the weekend. Wants to be seen. Will be here at 10am for a work in appt

## 2010-08-14 ENCOUNTER — Other Ambulatory Visit: Payer: Self-pay | Admitting: Family Medicine

## 2010-08-14 DIAGNOSIS — K219 Gastro-esophageal reflux disease without esophagitis: Secondary | ICD-10-CM

## 2010-08-14 NOTE — Consult Note (Signed)
Summary: [facility]   Imported By: Maryln Gottron 08/01/2010 13:05:50  _____________________________________________________________________  External Attachment:    Type:   Image     Comment:   External Document

## 2010-08-17 ENCOUNTER — Encounter: Payer: Self-pay | Admitting: Family Medicine

## 2010-08-17 ENCOUNTER — Ambulatory Visit (INDEPENDENT_AMBULATORY_CARE_PROVIDER_SITE_OTHER): Payer: BC Managed Care – PPO | Admitting: Family Medicine

## 2010-08-17 VITALS — BP 120/84 | HR 98 | Temp 97.9°F | Wt 158.8 lb

## 2010-08-17 DIAGNOSIS — S46819A Strain of other muscles, fascia and tendons at shoulder and upper arm level, unspecified arm, initial encounter: Secondary | ICD-10-CM

## 2010-08-17 NOTE — Progress Notes (Signed)
  Subjective:    Patient ID: Wanda Baker, female    DOB: 08-Apr-1961, 50 y.o.   MRN: [id number]  Shoulder Pain  Pain location: left trapezius/scapula. This is a recurrent problem. The current episode started yesterday. There has been no history of extremity trauma. The problem occurs constantly. The problem has been gradually worsening. The quality of the pain is described as aching and sharp. The pain is at a severity of 8/10. The pain is moderate. Pertinent negatives include no fever, inability to bear weight, itching, joint locking, joint swelling, limited range of motion, numbness, stiffness or tingling. The symptoms are aggravated by activity and contact. She has tried cold for the symptoms. The treatment provided no relief.      Review of Systems  Constitutional: Negative for fever.  Musculoskeletal: Negative for stiffness.  Skin: Negative for itching.  Neurological: Negative for tingling and numbness.       Objective:   Physical Exam  Musculoskeletal:       Right shoulder: She exhibits tenderness, pain and spasm.       Arms:         Assessment & Plan:  Pt. Has a history of trapezius muscle strain on the left side.  1. Trapezius muscle strain - Injection with 1/40 lidocaine/kenalog 1cc/1cc  Sterile procedure done without complication, informed consent obtained.

## 2010-08-28 ENCOUNTER — Encounter: Payer: Self-pay | Admitting: Family Medicine

## 2010-08-28 ENCOUNTER — Ambulatory Visit (INDEPENDENT_AMBULATORY_CARE_PROVIDER_SITE_OTHER): Payer: BC Managed Care – PPO | Admitting: Family Medicine

## 2010-08-28 VITALS — BP 122/80 | HR 78 | Temp 98.1°F | Wt 160.5 lb

## 2010-08-28 DIAGNOSIS — S43499A Other sprain of unspecified shoulder joint, initial encounter: Secondary | ICD-10-CM

## 2010-08-28 DIAGNOSIS — S46819A Strain of other muscles, fascia and tendons at shoulder and upper arm level, unspecified arm, initial encounter: Secondary | ICD-10-CM

## 2010-08-28 MED ORDER — TART CHERRY ADVANCED PO CAPS: 2.0000 | ORAL_CAPSULE | Freq: Two times a day (BID) | ORAL | Status: DC

## 2010-08-28 NOTE — Progress Notes (Signed)
  Subjective:    Patient ID: Wanda Baker, female    DOB: 1960-10-24, 50 y.o.   MRN: [id number]  Shoulder Pain  The pain is present in the back and left shoulder. This is a chronic problem. The current episode started more than 1 year ago. There has been no history of extremity trauma. The problem occurs constantly. The problem has been unchanged. The quality of the pain is described as aching and dull. The pain is at a severity of 3/10. Pertinent negatives include no joint locking, joint swelling, limited range of motion, numbness, stiffness or tingling. The symptoms are aggravated by activity. She has tried acetaminophen for the symptoms. The treatment provided moderate relief. There is no history of gout or rheumatoid arthritis.      Review of Systems  Musculoskeletal: Negative for stiffness and gout.  Neurological: Negative for tingling and numbness.       Objective:   Physical Exam  Constitutional: No distress.  Neck: Normal range of motion.       No cervical spine tendernous.  Negative Spurlings.  Cardiovascular: Normal rate.   Pulmonary/Chest: Effort normal.  Musculoskeletal:       Left trapezius knot felt near left shoulder blade.  Pain improved with massage.  Neurological:       Normal sensation and strength  Psychiatric: She has a normal mood and affect.          Assessment & Plan:

## 2010-08-28 NOTE — Assessment & Plan Note (Signed)
No signs of serious shoulder pathology.  Symptoms c/w trapezius strain.  Advised heat and stretching exercises.  Continue Tylenol.  Also advised tart cherry extract since she can't tolerate NSAIDs well.

## 2010-08-28 NOTE — Patient Instructions (Signed)
It was nice to meet you today Try Tart Cherry Extract for the pain If you feel your heart beating fast again please let us know Schedule a follow up appointment as needed

## 2010-08-28 NOTE — Progress Notes (Signed)
  Subjective:    Patient ID: Wanda Baker, female    DOB: [birth year], 50 y.o.   MRN: [id number]  HPI 2. Fast heart rate:  Pt also complained of one episode yesterday when it felt like her heart was beating fast.  She didn't check her pulse but it was faster than normal.  This happened when she went from lying to standing.  When she laid back down after a couple of minutes her heart rate went back to normal   Review of Systems     Objective:   Physical Exam Heart: RRR       Assessment & Plan:   Orthostatic tachycardia - no work up needed at this point - monitor for recurrence.

## 2010-08-29 ENCOUNTER — Telehealth: Payer: Self-pay | Admitting: Family Medicine

## 2010-08-29 LAB — CBC
MCH: 29.8 pg (ref 26.0–34.0)
MCHC: 33.9 g/dL (ref 30.0–36.0)
RDW: 12.7 % (ref 11.5–15.5)

## 2010-08-29 LAB — BASIC METABOLIC PANEL
Calcium: 9.5 mg/dL (ref 8.4–10.5)
Creatinine, Ser: 0.52 mg/dL (ref 0.4–1.2)
GFR calc Af Amer: >60 mL/min (ref >60)
GFR calc non Af Amer: >60 mL/min (ref >60)
Sodium: 134 mEq/L — ABNORMAL LOW (ref 135–145)

## 2010-08-29 LAB — PREGNANCY, URINE: Preg Test, Ur: NEGATIVE

## 2010-08-29 NOTE — Telephone Encounter (Signed)
Pt was here yesterday and was supposed to have meds called into [facility] [address].  Went to [facility] today and it was not there.  pls advise.

## 2010-08-29 NOTE — Telephone Encounter (Signed)
Pt was told to get tart cherry extract.  Called her pharmacy and they do not carry it but suggest a vitamin store.  Called pt and informed Nyheim Seufert, Maryjo Rochester

## 2010-08-30 LAB — POCT PREGNANCY, URINE: Preg Test, Ur: NEGATIVE

## 2010-09-06 ENCOUNTER — Telehealth: Payer: Self-pay | Admitting: Family Medicine

## 2010-09-06 NOTE — Telephone Encounter (Signed)
Pt was told to get tart cherry extract, pt cannot find it anywhere, is asking if MD recommends anything else?

## 2010-09-07 NOTE — Telephone Encounter (Signed)
I think Dr. Lelon Perla suggested that. Will forward to him for advice.

## 2010-09-10 NOTE — Telephone Encounter (Signed)
Its not that hard to find.  She can go to any health food store.  If she can't find it then she can take Ibuprofen or Tylenol.

## 2010-10-30 NOTE — Group Therapy Note (Signed)
[name], Wanda Baker NO.:  [id number]   MEDICAL RECORD NO.:  [id number]          PATIENT TYPE:  WOC   LOCATION:  [facility]                   FACILITY:  [facility]   PHYSICIAN:  Ginger Carne, MD DATE OF BIRTH:  May 12, 1961   DATE OF SERVICE:  01/07/2008                                  CLINIC NOTE   Wanda Baker is a 50 year old postmenopausal female who was referred by  Dr. Beverely Low because of a lesion on her cervix and a second opinion was  requested.  The patient denies symptomatology including bleeding,  postcoital bleeding or pain.   SALIENT PHYSICAL FINDINGS:  EXTERNAL GENITALIA:  Vulva and vagina  normal.  Cervix is smooth with a 1-2 cm Nabothian cyst present.  It is  not tender.  The patient had a Pap smear within the past 6 months.  The  patient was reassured that this is a benign growth and unless bleeding  becomes a problem with intercourse, or pain, it is not necessary to  treat this lesion.           ______________________________  Ginger Carne, MD     SHB/MEDQ  D:  01/07/2008  T:  01/07/2008  Job:  [id number]   cc:   Beverely Low, MD  Family Practice Resident, [facility]

## 2010-11-09 ENCOUNTER — Other Ambulatory Visit: Payer: Self-pay | Admitting: Family Medicine

## 2010-11-09 NOTE — Telephone Encounter (Signed)
Refill request

## 2010-11-30 ENCOUNTER — Encounter: Payer: Self-pay | Admitting: Family Medicine

## 2010-11-30 ENCOUNTER — Other Ambulatory Visit: Payer: Self-pay | Admitting: Family Medicine

## 2010-11-30 ENCOUNTER — Ambulatory Visit (INDEPENDENT_AMBULATORY_CARE_PROVIDER_SITE_OTHER): Payer: BC Managed Care – PPO | Admitting: Family Medicine

## 2010-11-30 VITALS — BP 125/77 | HR 105 | Temp 98.5°F | Wt 165.0 lb

## 2010-11-30 DIAGNOSIS — Z139 Encounter for screening, unspecified: Secondary | ICD-10-CM

## 2010-11-30 DIAGNOSIS — M545 Low back pain: Secondary | ICD-10-CM

## 2010-11-30 DIAGNOSIS — K219 Gastro-esophageal reflux disease without esophagitis: Secondary | ICD-10-CM

## 2010-11-30 DIAGNOSIS — I10 Essential (primary) hypertension: Secondary | ICD-10-CM

## 2010-11-30 DIAGNOSIS — Z1231 Encounter for screening mammogram for malignant neoplasm of breast: Secondary | ICD-10-CM

## 2010-11-30 DIAGNOSIS — E781 Pure hyperglyceridemia: Secondary | ICD-10-CM

## 2010-11-30 LAB — CBC
HCT: 42.6 % (ref 36.0–46.0)
Hemoglobin: 14.8 g/dL (ref 12.0–15.0)
MCH: 29.5 pg (ref 26.0–34.0)
MCHC: 34.7 g/dL (ref 30.0–36.0)
RDW: 12.8 % (ref 11.5–15.5)

## 2010-11-30 LAB — LIPID PANEL
Cholesterol: 205 mg/dL — ABNORMAL HIGH (ref 0–200)
Total CHOL/HDL Ratio: 5.3 Ratio

## 2010-11-30 LAB — BASIC METABOLIC PANEL
BUN: 14 mg/dL (ref 6–23)
Calcium: 10 mg/dL (ref 8.4–10.5)
Glucose, Bld: 101 mg/dL — ABNORMAL HIGH (ref 70–99)
Potassium: 3.7 mEq/L (ref 3.5–5.3)
Sodium: 137 mEq/L (ref 135–145)

## 2010-11-30 LAB — POCT GLYCOSYLATED HEMOGLOBIN (HGB A1C): Hemoglobin A1C: 6.1

## 2010-11-30 LAB — TSH: TSH: 0.289 u[IU]/mL — ABNORMAL LOW (ref 0.350–4.500)

## 2010-11-30 MED ORDER — HYDROCHLOROTHIAZIDE 25 MG PO TABS: 25.0000 mg | ORAL_TABLET | Freq: Every day | ORAL | Status: DC

## 2010-11-30 NOTE — Assessment & Plan Note (Signed)
At goal with manual cuff. Will refill HCTZ and continue lisinopril. Check labs today. Encourage patient to check BP at home and normal being <130/80. F/u in 6 months.

## 2010-11-30 NOTE — Progress Notes (Signed)
  Subjective:    Patient ID: Wanda Baker, female    DOB: 1960/07/03, 50 y.o.   MRN: [id number]  HPI CPE.  1. Fibroids. S/p supracervical hysterectomy in 06/2010. No post-op complications. Last pap normal in 10/2008.   2. HTN. BP at home typically 120s systolic. Taking meds as prescribed. Avoids excess salt intake and exercises usually.   3. Back pain. Secondary to arthritis and chronic. Takes tylenol regularly that helps. Was unable to find recommended cherry extract. Thinks neurontin may help, but pain still present. Does not significantly hinder work ability.  4. GERD. Symptoms controlled with increased dose nexium.    Review of Systems Denies abnormal bleeding, leg swelling, rash, visual changes, HA, abdominal pain. All other systems negative.    Objective:   Physical Exam  Vitals reviewed. Constitutional: She is oriented to person, place, and time. She appears well-developed and well-nourished. No distress.  HENT:  Head: Normocephalic and atraumatic.  Eyes: Pupils are equal, round, and reactive to light.  Cardiovascular: Normal rate, regular rhythm and normal heart sounds.   No murmur heard. Pulmonary/Chest: Effort normal.  Abdominal: Soft.  Musculoskeletal: She exhibits no edema and no tenderness.  Neurological: She is alert and oriented to person, place, and time.  Skin: She is not diaphoretic.  Psychiatric: She has a normal mood and affect. Her behavior is normal.          Assessment & Plan:

## 2010-11-30 NOTE — Assessment & Plan Note (Signed)
Controlled. Will continue nexium 40mg  daily.

## 2010-11-30 NOTE — Assessment & Plan Note (Signed)
Fasting lipid panel today. 

## 2010-11-30 NOTE — Assessment & Plan Note (Signed)
Osteoarthritis most likely cause. Recommended she continue Tylenol and may use aleve or motrin as needed. Heat therapy and to maintain activity as best as possible.

## 2010-11-30 NOTE — Patient Instructions (Signed)
Nice to see you again. I will call if your labs are abnormal.  Schedule check-up in 88mo or one year for pap. You should schedule a colonoscopy for cancer screening.  Colonoscopy A colonoscopy is an exam to evaluate your entire colon. In this exam, your colon is cleansed. A long fiberoptic tube is inserted through your rectum and into your colon. The fiberoptic scope (endoscope) is a long bundle of enclosed and very flexible fibers. These fibers transmit light to the area examined and send images from that area to your caregiver. Discomfort is usually minimal. You may be given a drug to help you sleep (sedative) during or prior to the procedure. This exam helps to detect lumps (tumors), polyps, inflammation, and areas of bleeding. Your caregiver may also take a small piece of tissue (biopsy) that will be examined under a microscope. BEFORE THE PROCEDURE  A clear liquid diet may be required for 2 days before the exam.   Liquid injections (enemas) or laxatives may be required.   A large amount of electrolyte solution may be given to you to drink over a short period of time. This solution is used to clean out your colon.   You should be present  prior to your procedure or as directed by your caregiver.   Check in at the admissions desk to fill out necessary forms if not preregistered. There will be consent forms to sign prior to the procedure. If accompanied by friends or family, there is a waiting area for them while you are having your procedure.  LET YOUR CAREGIVER KNOW ABOUT:  Allergies to food or medicine.  Medicines taken, including vitamins, herbs, eyedrops, over-the-counter medicines, and creams.   Use of steroids (by mouth or creams).   Previous problems with anesthetics or numbing medicines.   History of bleeding problems or blood clots.  Previous surgery.   Other health problems, including diabetes and kidney problems.   Possibility of pregnancy, if this applies.   AFTER THE  PROCEDURE  If you received a sedative and/or pain medicine, you will need to arrange for someone to drive you home.   Occasionally, there is a little blood passed with the first bowel movement. DO NOT be concerned.  HOME CARE INSTRUCTIONS  It is not unusual to pass moderate amounts of gas and experience mild abdominal cramping following the procedure. This is due to air being used to inflate your colon during the exam. Walking or a warm pack on your belly (abdomen) may help.   You may resume all normal meals and activities after sedatives and medicines have worn off.   Only take over-the-counter or prescription medicines for pain, discomfort, or fever as directed by your caregiver. DO NOT use aspirin or blood thinners if a biopsy was taken. Consult your caregiver for medicine usage if biopsies were taken.  FINDING OUT THE RESULTS OF YOUR TEST Not all test results are available during your visit. If your test results are not back during the visit, make an appointment with your caregiver to find out the results. Do not assume everything is normal if you have not heard from your caregiver or the medical facility. It is important for you to follow up on all of your test results. SEEK IMMEDIATE MEDICAL CARE IF:  You have an oral temperature above 101, not controlled by medicine.   You pass large blood clots or fill a toilet with blood following the procedure. This may also occur 10 to 14 days following the procedure.  This is more likely if a biopsy was taken.   You develop abdominal pain that keeps getting worse and cannot be relieved with medicine.  Document Released: 05/31/2000 Document Re-Released: 08/28/2009 [facility] Patient Information 2011 [place], [place].

## 2010-12-02 NOTE — Progress Notes (Signed)
Addended by: Durwin Reges on: 12/02/2010 12:53 AM   Modules accepted: Orders

## 2010-12-04 LAB — T4, FREE: Free T4: 1.35 ng/dL (ref 0.80–1.80)

## 2010-12-04 LAB — T3, FREE: T3, Free: 3.6 pg/mL (ref 2.3–4.2)

## 2010-12-05 ENCOUNTER — Encounter: Payer: Self-pay | Admitting: Family Medicine

## 2010-12-06 ENCOUNTER — Ambulatory Visit
Admission: RE | Admit: 2010-12-06 | Discharge: 2010-12-06 | Disposition: A | Discharge status: Home / Self Care | Payer: BC Managed Care – PPO | Source: Ambulatory Visit | Attending: Internal Medicine | Admitting: Internal Medicine

## 2010-12-06 DIAGNOSIS — Z1231 Encounter for screening mammogram for malignant neoplasm of breast: Secondary | ICD-10-CM

## 2010-12-13 ENCOUNTER — Other Ambulatory Visit: Payer: Self-pay | Admitting: Family Medicine

## 2010-12-13 ENCOUNTER — Ambulatory Visit: Payer: BC Managed Care – PPO

## 2010-12-13 DIAGNOSIS — I10 Essential (primary) hypertension: Secondary | ICD-10-CM

## 2011-01-10 ENCOUNTER — Other Ambulatory Visit: Payer: Self-pay | Admitting: Family Medicine

## 2011-01-10 NOTE — Telephone Encounter (Signed)
Refill request

## 2011-03-04 ENCOUNTER — Ambulatory Visit (INDEPENDENT_AMBULATORY_CARE_PROVIDER_SITE_OTHER): Payer: BC Managed Care – PPO | Admitting: Family Medicine

## 2011-03-04 ENCOUNTER — Encounter: Payer: Self-pay | Admitting: Family Medicine

## 2011-03-04 VITALS — BP 143/92 | HR 107 | Temp 98.2°F | Ht 65.75 in | Wt 164.9 lb

## 2011-03-04 DIAGNOSIS — M545 Low back pain: Secondary | ICD-10-CM

## 2011-03-04 DIAGNOSIS — Z23 Encounter for immunization: Secondary | ICD-10-CM

## 2011-03-04 MED ORDER — MELOXICAM 7.5 MG PO TABS: 7.5000 mg | ORAL_TABLET | Freq: Every day | ORAL | Status: DC

## 2011-03-04 MED ORDER — CYCLOBENZAPRINE HCL 5 MG PO TABS: 5.0000 mg | ORAL_TABLET | Freq: Every evening | ORAL | Status: AC | PRN

## 2011-03-04 NOTE — Progress Notes (Signed)
  Subjective:    Wanda Baker is a 50 y.o. female who presents for evaluation of low back pain. The patient has had previous osteoarthritis of lumbar spine and a previous herniated disc over 10 years ago, diagnosed by MRI at that time. Current symptoms have been present for 2 days and are stable.  Patient awoke 2 days ago with this pain. Onset was precipitated by no known injury. The pain is located in the across the lower back and does not radiate. The pain is described as aching and stiffness and occurs all day. She rates her pain as severe. Symptoms are exacerbated by extension, flexion, twisting and with standing up.. Symptoms are improved by acetaminophen and heat.  She has no other symptoms associated with the back pain. The patient has no "red flag" history indicative of complicated back pain.  The following portions of the patient's history were reviewed and updated as appropriate: allergies, current medications, past family history, past medical history, past social history, past surgical history and problem list.  Review of Systems Musculoskeletal:negative for muscle weakness, numbness, tingling, falls, trauma, fevers, bowel/bladder incontinence.   Objective:   Normal reflexes, gait, strength and negative straight-leg raise. Inspection and palpation: inspection of back is normal, paraspinal tenderness noted bilateral L3-5 region., Lateral flexion produces contralateral paraspinal pain. Negative facet loading. Mild bilateral TTP over SI joints.. Muscle tone and ROM exam: muscle spasm noted L3-L5 paraspinal musculature., limited range of motion with pain. Neurological: normal DTRs, muscle strength and reflexes.    Assessment:    Muscular strain of low back    Plan:    Exam most consistent with muscular strain. No red flags for spinal cord involvement today. Short (2-4 day) period of relative rest recommended until acute symptoms improve. Heat to affected area as needed for local  pain relief. NSAIDs per medication orders. Muscle relaxants per medication orders. Patient can return to regular work on 9/23. Follow-up in 2 weeks or sooner if symptoms worsen.  Will consider imaging if does not improve at next follow up in 2-3 weeks.

## 2011-03-04 NOTE — Patient Instructions (Addendum)
Your back pain seems related to muscle spasm. Try taking mobic for inflammation and flexeril at night time for spasm. Make an appointment in 2-3 weeks for follow up. You may benefit from physical therapy if stable, or we may need to do a scan if you're not better.  Back Pain & Injury Your back pain is most likely caused by a strain of the muscles or ligaments supporting the spine. Back strains cause pain and trouble moving because of muscle spasms. They may take several weeks to heal. Usually they are better in days.  Treatment for back pain includes:  Rest - Get bed rest as needed over the next day or two. Use a firm mattress and lie on your side with your knees slightly bent. If you lie on your back, put a pillow under your knees.   Early movement - Back pain improves most rapidly if you remain active. It is much more stressful on the back to sit or stand in one place. Do not sit, drive or stand in one place for more than 30 minutes at a time. Take short walks on level surfaces as soon as pain allows.   Limit bending and lifting - Do not bend over or lift anything over 20 pounds until instructed otherwise. Lift by bending your knees. Use your leg muscles to help. Keep the load close to your body and avoid twisting. Do not reach or do overhead work.   Medicines - Medicine to reduce pain and inflammation are helpful. Muscle-relaxing drugs may be prescribed.   Therapy - Put ice packs on your back every few hours for the first 2-3 days after your injury or as instructed. After that ice or heat may be alternated to reduce pain and spasm. Back exercises and gentle massage may be of some benefit. You should be examined again if your back pain is not better in one week.  SEEK IMMEDIATE MEDICAL CARE IF:  You have pain that radiates from your back into your legs.   You develop new bowel or bladder control problems.   You have unusual weakness or numbness in your arms or legs.   You develop nausea  or vomiting.   You develop abdominal pain.   You feel faint.  Document Released: 06/03/2005 Document Re-Released: 03/12/2008 [facility] Patient Information 2011 [place], [place].

## 2011-03-18 ENCOUNTER — Telehealth: Payer: Self-pay | Admitting: Family Medicine

## 2011-03-18 NOTE — Telephone Encounter (Signed)
Forwarded to pcp.Wanda Baker  

## 2011-03-18 NOTE — Telephone Encounter (Signed)
Pt states that the Mobic is not working and would like something else called into [facility] [address]

## 2011-03-19 ENCOUNTER — Telehealth: Payer: Self-pay | Admitting: Family Medicine

## 2011-03-19 MED ORDER — DICLOFENAC SODIUM 75 MG PO TBEC: 75.0000 mg | DELAYED_RELEASE_TABLET | Freq: Two times a day (BID) | ORAL | Status: AC

## 2011-03-19 NOTE — Telephone Encounter (Signed)
Spoke with patient and informed her of below. She stated that she already has an appointment with Dr. Cristal Ford next week and if is not feeling better she will address it then.Wanda Baker

## 2011-03-19 NOTE — Telephone Encounter (Signed)
Patient not available to take message. Would recommend addition of scheduled tylenol. Will send an alternative NSAID to take the place of mobic. Would advise patient to return to care if not improving in next week.

## 2011-03-19 NOTE — Telephone Encounter (Signed)
Pt is calling again about her pain meds.  Needs to know something asap.

## 2011-03-19 NOTE — Telephone Encounter (Signed)
I called patient but she was unavailable. Please attempt to call her later today. Would try adding tylenol 1000mg  3 or 4 times daily. I have also sent a different pain medication to pharmacy called voltaren. She should discontinue mobic and follow up in clinic in next week if not improved.

## 2011-03-27 ENCOUNTER — Ambulatory Visit (INDEPENDENT_AMBULATORY_CARE_PROVIDER_SITE_OTHER): Payer: BC Managed Care – PPO | Admitting: Family Medicine

## 2011-03-27 ENCOUNTER — Telehealth: Payer: Self-pay | Admitting: Family Medicine

## 2011-03-27 ENCOUNTER — Encounter: Payer: Self-pay | Admitting: Family Medicine

## 2011-03-27 VITALS — BP 134/86 | HR 99 | Temp 98.1°F | Ht 67.5 in | Wt 166.4 lb

## 2011-03-27 DIAGNOSIS — M545 Low back pain: Secondary | ICD-10-CM

## 2011-03-27 DIAGNOSIS — K219 Gastro-esophageal reflux disease without esophagitis: Secondary | ICD-10-CM

## 2011-03-27 MED ORDER — ESOMEPRAZOLE MAGNESIUM 40 MG PO CPDR: 40.0000 mg | DELAYED_RELEASE_CAPSULE | Freq: Every day | ORAL | Status: DC

## 2011-03-27 NOTE — Assessment & Plan Note (Signed)
Symptoms controlled on nexium. Request refill today.

## 2011-03-27 NOTE — Assessment & Plan Note (Addendum)
Persistent radiculopathy and L5-S1 distribution. No signs of nerve damage, but given the severity of pain will reassess anatomy with a lumbar MRI and make orthopedic referral since patient has failed conservative therapy. If this is a worsening of facet arthropathy or her disc herniation, patient may benefit from injection therapy. Will continue continue Voltaren therapy in addition to Tylenol for pain currently.

## 2011-03-27 NOTE — Patient Instructions (Signed)
We will get MRI on your back. Referral to orthopedics for evaluation. Continue voltaren in the meantime. Try to remain active as much as possible.  Back Pain (Lumbosacral Strain) Back pain is one of the most common causes of pain. There are many causes of back pain. Most are not serious conditions.  CAUSES Your backbone (spinal column) is made up of 24 main vertebral bodies, the sacrum, and the coccyx. These are held together by muscles and tough, fibrous tissue (ligaments). Nerve roots pass through the openings between the vertebrae. A sudden move or injury to the back may cause injury to, or pressure on, these nerves. This may result in localized back pain or pain movement (radiation) into the buttocks, down the leg, and into the foot. Sharp, shooting pain from the buttock down the back of the leg (sciatica) is frequently associated with a ruptured (herniated) disc. Pain may be caused by muscle spasm alone. Your caregiver can often find the cause of your pain by the details of your symptoms and an exam. In some cases, you may need tests (such as X-rays). Your caregiver will work with you to decide if any tests are needed based on your specific exam. HOME CARE INSTRUCTIONS  Avoid an underactive lifestyle. Active exercise, as directed by your caregiver, is your greatest weapon against back pain.   Avoid hard physical activities (tennis, racquetball, water-skiing) if you are not in proper physical condition for it. This may aggravate and/or create problems.   If you have a back problem, avoid sports requiring sudden body movements. Swimming and walking are generally safer activities.   Maintain good posture.   Avoid becoming overweight (obese).   Use bed rest for only the most extreme, sudden (acute) episode. Your caregiver will help you determine how much bed rest is necessary.   For acute conditions, you may put ice on the injured area.   Put ice in a plastic bag.   Place a towel  between your skin and the bag.   Leave the ice on for 15 minutes at a time, every 2 hours, or as needed.   After you are improved and more active, it may help to apply heat for 30 minutes before activities.  See your caregiver if you are having pain that lasts longer than expected. Your caregiver can advise appropriate exercises and/or therapy if needed. With conditioning, most back problems can be avoided. SEEK IMMEDIATE MEDICAL CARE IF:  You have numbness, tingling, weakness, or problems with the use of your arms or legs.   You experience severe back pain not relieved with medicines.   There is a change in bowel or bladder control.   You have increasing pain in any area of the body, including your belly (abdomen).   You notice shortness of breath, dizziness, or feel faint.   You feel sick to your stomach (nauseous), are throwing up (vomiting), or become sweaty.   You notice discoloration of your toes or legs, or your feet get very cold.   Your back pain is getting worse.   You have an oral temperature above 101, not controlled by medicine.  MAKE SURE YOU:   Understand these instructions.   Will watch your condition.   Will get help right away if you are not doing well or get worse.  Document Released: 03/13/2005 Document Re-Released: 08/28/2009 [facility] Patient Information 2011 [place], [place].

## 2011-03-27 NOTE — Telephone Encounter (Signed)
Phone call complete Dalaysia Harms, Maryjo Rochester

## 2011-03-27 NOTE — Progress Notes (Signed)
  Subjective:    Patient ID: Wanda Baker, female    DOB: [birth year], 50 y.o.   MRN: [id number]  HPI 1. low back pain. Patient follows up after one month of continued low back pain despite conservative therapy with NSAIDs. Both parents help as well as use of a back brace during work, the patient still complains of 8/10 low back pain. It is associated with radiation to left lateral leg down to sole of foot. Worsened with extension. Denies numbness, tingling, weakness, trauma, incontinence. 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   Review of Systems See history of present illness otherwise negative    Objective:   Physical Exam  Vitals reviewed. Constitutional: She is oriented to person, place, and time. She appears well-developed and well-nourished. No distress.  HENT:  Head: Normocephalic and atraumatic.  Eyes: EOM are normal. Pupils are equal, round, and reactive to light.  Neck: Normal range of motion. Neck supple.  Cardiovascular: Normal rate, regular rhythm, normal heart sounds and intact distal pulses.   No murmur heard. Pulmonary/Chest: Effort normal.  Musculoskeletal: She exhibits no edema and no tenderness.       Limited lumbar extension. Tenderness to palpation in lumbosacral area and surrounding paraspinal musculature. Flexion intact but extension causes pain. Bilateral lower extremities sensation and patellar and ankle reflexes are symmetric. 5 out of 5 lower extremity dorsi flexion and knee extension. Pain with straight leg testing but negative test. Bilateral positive Faber test. Gait normal.  Neurological: She is alert and oriented to person, place, and time. Coordination normal.          Assessment & Plan:

## 2011-03-27 NOTE — Telephone Encounter (Signed)
Call patient back regarding insurance.

## 2011-03-28 ENCOUNTER — Ambulatory Visit
Admission: RE | Admit: 2011-03-28 | Discharge: 2011-03-28 | Disposition: A | Discharge status: Home / Self Care | Payer: BC Managed Care – PPO | Source: Ambulatory Visit | Attending: Family Medicine | Admitting: Family Medicine

## 2011-03-28 DIAGNOSIS — M545 Low back pain: Secondary | ICD-10-CM

## 2011-06-24 ENCOUNTER — Encounter: Payer: Self-pay | Admitting: Family Medicine

## 2011-06-24 ENCOUNTER — Ambulatory Visit (INDEPENDENT_AMBULATORY_CARE_PROVIDER_SITE_OTHER): Payer: BC Managed Care – PPO | Admitting: Family Medicine

## 2011-06-24 DIAGNOSIS — J069 Acute upper respiratory infection, unspecified: Secondary | ICD-10-CM | POA: Insufficient documentation

## 2011-06-24 NOTE — Patient Instructions (Signed)
Viral Infections A viral infection can be caused by different types of viruses.Most viral infections are not serious and resolve on their own. However, some infections may cause severe symptoms and may lead to further complications. SYMPTOMS Viruses can frequently cause:  Minor sore throat.   Aches and pains.   Headaches.   Runny nose.   Different types of rashes.   Watery eyes.   Tiredness.   Cough.   Loss of appetite.   Gastrointestinal infections, resulting in nausea, vomiting, and diarrhea.  These symptoms do not respond to antibiotics because the infection is not caused by bacteria. However, you might catch a bacterial infection following the viral infection. This is sometimes called a "superinfection." Symptoms of such a bacterial infection may include:  Worsening sore throat with pus and difficulty swallowing.   Swollen neck glands.   Chills and a high or persistent fever.   Severe headache.   Tenderness over the sinuses.   Persistent overall ill feeling (malaise), muscle aches, and tiredness (fatigue).   Persistent cough.   Yellow, green, or brown mucus production with coughing.  HOME CARE INSTRUCTIONS   Only take over-the-counter or prescription medicines for pain, discomfort, diarrhea, or fever as directed by your caregiver.   Drink enough water and fluids to keep your urine clear or pale yellow. Sports drinks can provide valuable electrolytes, sugars, and hydration.   Get plenty of rest and maintain proper nutrition. Soups and broths with crackers or rice are fine.  SEEK IMMEDIATE MEDICAL CARE IF:   You have severe headaches, shortness of breath, chest pain, neck pain, or an unusual rash.   You have uncontrolled vomiting, diarrhea, or you are unable to keep down fluids.   You or your child has an oral temperature above 102 F (38.9 C), not controlled by medicine.   Your baby is older than 3 months with a rectal temperature of 102 F (38.9 C) or  higher.   Your baby is 3 months old or younger with a rectal temperature of 100.4 F (38 C) or higher.  MAKE SURE YOU:   Understand these instructions.   Will watch your condition.   Will get help right away if you are not doing well or get worse.  Document Released: 03/13/2005 Document Revised: 02/13/2011 Document Reviewed: 10/08/2010 [employer] Patient Information 2012 [employer]. 

## 2011-06-24 NOTE — Telephone Encounter (Signed)
Error

## 2011-06-24 NOTE — Telephone Encounter (Signed)
This encounter was created in error - please disregard.

## 2011-06-24 NOTE — Progress Notes (Signed)
  Subjective:    Patient ID: Wanda Baker, female    DOB: 12-Jun-1961, 51 y.o.   MRN: [id number]  HPI URI sxs x 3 days. Sxs include nasal congestion, generalized malaise, sore throat, cough. + rhinorrhea and post nasal drip. No fevers. No nausea, vomiting, diarrhea. No increased WOB. Pt has been tolerating fluids. No known sick contacts. Pt has a baseline hx/o allergic rhinitis per pt. Pt states that she takes zyrtec on a daily basis. Pt states that she has not been able to tolerate nasal steroids as this causes headaches. Pt does not smoke.  Review of Systems See HPI, otherwise ROS negative    Objective:   Physical Exam Gen: up in chair, NAD HEENT: NCAT, EOMI, TMs clear bilaterally, +nasal erythema, rhinorrhea bilaterally, + post oropharyngeal erythema CV: RRR, no murmurs auscultated PULM: CTAB, no wheezes, rales, rhoncii ABD: S/NT/+ bowel sounds  EXT: 2+ peripheral pulses    Assessment & Plan:  j

## 2011-06-24 NOTE — Assessment & Plan Note (Signed)
Discussed supportive care and infectious red flags. Handout given. Will follow prn.

## 2011-07-11 ENCOUNTER — Other Ambulatory Visit: Payer: Self-pay | Admitting: Family Medicine

## 2011-07-11 MED ORDER — LISINOPRIL 5 MG PO TABS: 5.0000 mg | ORAL_TABLET | Freq: Every day | ORAL | Status: DC

## 2011-08-12 ENCOUNTER — Encounter: Payer: Self-pay | Admitting: Family Medicine

## 2011-08-12 ENCOUNTER — Ambulatory Visit (INDEPENDENT_AMBULATORY_CARE_PROVIDER_SITE_OTHER): Payer: BC Managed Care – PPO | Admitting: Family Medicine

## 2011-08-12 VITALS — BP 125/85 | HR 99 | Temp 98.0°F | Ht 67.5 in | Wt 167.0 lb

## 2011-08-12 DIAGNOSIS — J029 Acute pharyngitis, unspecified: Secondary | ICD-10-CM

## 2011-08-12 DIAGNOSIS — J069 Acute upper respiratory infection, unspecified: Secondary | ICD-10-CM

## 2011-08-12 NOTE — Assessment & Plan Note (Signed)
Symptomatic treatment only at this time.  (see pt education instructions).  Gave red flags for return.

## 2011-08-12 NOTE — Patient Instructions (Signed)
Fever and sore throat pain: Tylenol  Also for sore throat: Chloraseptic throat spray Saline salt water gargle 2-3 x per day.   For cough: Mucinex 1200mg  by mouth 2 x per day.   You will get better with time.  We are just controlling the symptoms of the virus while your body fights it off.   You should be better/improving within 10 days.   If it lasts longer than 10 days and you are having fever come back.

## 2011-08-12 NOTE — Progress Notes (Signed)
  Subjective:    Patient ID: Wanda Baker, female    DOB: 1960-11-02, 51 y.o.   MRN: [id number]  HPI Cold symptoms x 3 days: + cough, +sorethroat,  + h/a off and on, + hoarseness off and on x 3 days.  Has been using tylenol for sore throat and h/a.  Also, has been using saline nasal spray.  No fever.  Missed work yesterday and today.  No n/v/d.  No abd pain.  No rash.    Review of Systems As per above.     Objective:   Physical Exam  Constitutional: She appears well-developed and well-nourished.  HENT:  Head: Normocephalic and atraumatic.  Mouth/Throat: No oropharyngeal exudate.       + throat erythema, + nasal congestion.  + nasal mucous membrane inflammation. Clear nasal drainage.   Eyes: Pupils are equal, round, and reactive to light. Right eye exhibits no discharge. Left eye exhibits no discharge.  Neck: Normal range of motion.  Cardiovascular: Normal rate, regular rhythm and normal heart sounds.   No murmur heard. Pulmonary/Chest: Effort normal and breath sounds normal. No respiratory distress. She has no wheezes. She has no rales.  Lymphadenopathy:    She has no cervical adenopathy.  Neurological: She is alert.  Skin: No rash noted.  Psychiatric: She has a normal mood and affect. Her behavior is normal.          Assessment & Plan:

## 2011-11-06 ENCOUNTER — Other Ambulatory Visit: Payer: Self-pay | Admitting: Family Medicine

## 2011-11-06 DIAGNOSIS — Z1231 Encounter for screening mammogram for malignant neoplasm of breast: Secondary | ICD-10-CM

## 2011-11-21 ENCOUNTER — Other Ambulatory Visit: Payer: Self-pay | Admitting: *Deleted

## 2011-11-21 DIAGNOSIS — K219 Gastro-esophageal reflux disease without esophagitis: Secondary | ICD-10-CM

## 2011-11-21 MED ORDER — ESOMEPRAZOLE MAGNESIUM 40 MG PO CPDR: 40.0000 mg | DELAYED_RELEASE_CAPSULE | Freq: Every day | ORAL | Status: DC

## 2011-11-28 ENCOUNTER — Other Ambulatory Visit: Payer: Self-pay | Admitting: Family Medicine

## 2011-11-28 DIAGNOSIS — I10 Essential (primary) hypertension: Secondary | ICD-10-CM

## 2011-12-02 ENCOUNTER — Encounter: Payer: Self-pay | Admitting: Family Medicine

## 2011-12-02 ENCOUNTER — Ambulatory Visit (INDEPENDENT_AMBULATORY_CARE_PROVIDER_SITE_OTHER): Payer: BC Managed Care – PPO | Admitting: Family Medicine

## 2011-12-02 VITALS — HR 93 | Temp 97.1°F | Ht 67.5 in | Wt 171.5 lb

## 2011-12-02 DIAGNOSIS — E781 Pure hyperglyceridemia: Secondary | ICD-10-CM

## 2011-12-02 DIAGNOSIS — M545 Low back pain: Secondary | ICD-10-CM

## 2011-12-02 DIAGNOSIS — I10 Essential (primary) hypertension: Secondary | ICD-10-CM

## 2011-12-02 LAB — LIPID PANEL
HDL: 32 mg/dL — ABNORMAL LOW (ref >39)
LDL Cholesterol: 96 mg/dL (ref 0–99)
Triglycerides: 334 mg/dL — ABNORMAL HIGH (ref <150)
VLDL: 67 mg/dL — ABNORMAL HIGH (ref 0–40)

## 2011-12-02 LAB — BASIC METABOLIC PANEL
BUN: 12 mg/dL (ref 6–23)
Creat: 0.55 mg/dL (ref 0.50–1.10)
Potassium: 3.7 mEq/L (ref 3.5–5.3)

## 2011-12-02 NOTE — Progress Notes (Signed)
  Subjective:    Patient ID: Wanda Baker, female    DOB: Aug 26, 1960, 51 y.o.   MRN: [id number]  HPI CPE, patient prefers to reschedule for pap, has to be at work.  1. Back pain. Patient was referred to orthopedics and consequently to back surgeon. So far has tried physical therapy and back injections without improvement. She has been started on Neurontin 600 3 times a day which helps some. Desires a second opinion for back surgery evaluation. Denies falls, weakness, new trauma.  2. health maintenance. Patient is due for the past, recently had supracervical hysterectomy. She desires to scheduled for this evaluation.  3. hypertension. Patient is taking HCTZ and lisinopril as prescribed. States that she was excited this morning because of a car pulling out in front of her. Normally under good control.   4. hypertriglyceridemia. The patient has started on fish oil. Due for lipid panel. Does walk for exercise nearly daily.  Review of Systems See HPI otherwise negative.  reports that she quit smoking about 2 years ago. She has never used smokeless tobacco.     Objective:   Physical Exam  Vitals reviewed. Constitutional: She is oriented to person, place, and time. She appears well-developed and well-nourished. No distress.  HENT:  Head: Normocephalic and atraumatic.  Mouth/Throat: Oropharynx is clear and moist.  Eyes: EOM are normal.  Cardiovascular: Normal rate, regular rhythm, normal heart sounds and intact distal pulses.   No murmur heard. Pulmonary/Chest: Effort normal and breath sounds normal. No respiratory distress. She has no wheezes.  Musculoskeletal: She exhibits no edema and no tenderness.       Normal gait  Neurological: She is alert and oriented to person, place, and time. She exhibits normal muscle tone. Coordination normal.  Skin: No rash noted. She is not diaphoretic.  Psychiatric: She has a normal mood and affect.          Assessment & Plan:

## 2011-12-02 NOTE — Patient Instructions (Addendum)
Nice to see you. I will call if labs are abnormal. Call us about which back surgeon you want to see. You can safely take tylenol and neurontin. Your blood pressure is too high if over 140/80.  Keep exercising daily, maybe pool workouts would be helpful.  Cholesterol Control Diet Cholesterol levels in your body are determined significantly by your diet. Cholesterol levels may also be related to heart disease. The following material helps to explain this relationship and discusses what you can do to help keep your heart healthy. Not all cholesterol is bad. Low-density lipoprotein (LDL) cholesterol is the "bad" cholesterol. It may cause fatty deposits to build up inside your arteries. High-density lipoprotein (HDL) cholesterol is "good." It helps to remove the "bad" LDL cholesterol from your blood. Cholesterol is a very important risk factor for heart disease. Other risk factors are high blood pressure, smoking, stress, heredity, and weight. The heart muscle gets its supply of blood through the coronary arteries. If your LDL cholesterol is high and your HDL cholesterol is low, you are at risk for having fatty deposits build up in your coronary arteries. This leaves less room through which blood can flow. Without sufficient blood and oxygen, the heart muscle cannot function properly and you may feel chest pains (angina pectoris). When a coronary artery closes up entirely, a part of the heart muscle may die, causing a heart attack (myocardial infarction). CHECKING CHOLESTEROL When your caregiver sends your blood to a lab to be analyzed for cholesterol, a complete lipid (fat) profile may be done. With this test, the total amount of cholesterol and levels of LDL and HDL are determined. Triglycerides are a type of fat that circulates in the blood and can also be used to determine heart disease risk. The list below describes what the numbers should be: Test: Total Cholesterol.  Less than 200 mg/dl.  Test: LDL  "bad cholesterol."  Less than 100 mg/dl.   Less than 70 mg/dl if you are at very high risk of a heart attack or sudden cardiac death.  Test: HDL "good cholesterol."  Greater than 50 mg/dl for women.   Greater than 40 mg/dl for men.  Test: Triglycerides.  Less than 150 mg/dl.  CONTROLLING CHOLESTEROL WITH DIET Although exercise and lifestyle factors are important, your diet is key. That is because certain foods are known to raise cholesterol and others to lower it. The goal is to balance foods for their effect on cholesterol and more importantly, to replace saturated and trans fat with other types of fat, such as monounsaturated fat, polyunsaturated fat, and omega-3 fatty acids. On average, a person should consume no more than 15 to 17 g of saturated fat daily. Saturated and trans fats are considered "bad" fats, and they will raise LDL cholesterol. Saturated fats are primarily found in animal products such as meats, butter, and cream. However, that does not mean you need to sacrifice all your favorite foods. Today, there are good tasting, low-fat, low-cholesterol substitutes for most of the things you like to eat. Choose low-fat or nonfat alternatives. Choose round or loin cuts of red meat, since these types of cuts are lowest in fat and cholesterol. Chicken (without the skin), fish, veal, and ground [place] breast are excellent choices. Eliminate fatty meats, such as hot dogs and salami. Even shellfish have little or no saturated fat. Have a 3 oz (85 g) portion when you eat lean meat, poultry, or fish. Trans fats are also called "partially hydrogenated oils." They are oils  that have been scientifically manipulated so that they are solid at room temperature resulting in a longer shelf life and improved taste and texture of foods in which they are added. Trans fats are found in stick margarine, some tub margarines, cookies, crackers, and baked goods.  When baking and cooking, oils are an excellent  substitute for butter. The monounsaturated oils are especially beneficial since it is believed they lower LDL and raise HDL. The oils you should avoid entirely are saturated tropical oils, such as coconut and palm.  Remember to eat liberally from food groups that are naturally free of saturated and trans fat, including fish, fruit, vegetables, beans, grains (barley, rice, couscous, bulgur wheat), and pasta (without cream sauces).  IDENTIFYING FOODS THAT LOWER CHOLESTEROL  Soluble fiber may lower your cholesterol. This type of fiber is found in fruits such as apples, vegetables such as broccoli, potatoes, and carrots, legumes such as beans, peas, and lentils, and grains such as barley. Foods fortified with plant sterols (phytosterol) may also lower cholesterol. You should eat at least 2 g per day of these foods for a cholesterol lowering effect.  Read package labels to identify low-saturated fats, trans fats free, and low-fat foods at the supermarket. Select cheeses that have only 2 to 3 g saturated fat per ounce. Use a heart-healthy tub margarine that is free of trans fats or partially hydrogenated oil. When buying baked goods (cookies, crackers), avoid partially hydrogenated oils. Breads and muffins should be made from whole grains (whole-wheat or whole oat flour, instead of "flour" or "enriched flour"). Buy non-creamy canned soups with reduced salt and no added fats.  FOOD PREPARATION TECHNIQUES  Never deep-fry. If you must fry, either stir-fry, which uses very little fat, or use non-stick cooking sprays. When possible, broil, bake, or roast meats, and steam vegetables. Instead of dressing vegetables with butter or margarine, use lemon and herbs, applesauce and cinnamon (for squash and sweet potatoes), nonfat yogurt, salsa, and low-fat dressings for salads.  LOW-SATURATED FAT / LOW-FAT FOOD SUBSTITUTES Meats / Saturated Fat (g)  Avoid: Steak, marbled (3 oz/85 g) / 11 g   Choose: Steak, lean (3 oz/85 g)  / 4 g   Avoid: Hamburger (3 oz/85 g) / 7 g   Choose: Hamburger, lean (3 oz/85 g) / 5 g   Avoid: Ham (3 oz/85 g) / 6 g   Choose: Ham, lean cut (3 oz/85 g) / 2.4 g   Avoid: Chicken, with skin, dark meat (3 oz/85 g) / 4 g   Choose: Chicken, skin removed, dark meat (3 oz/85 g) / 2 g   Avoid: Chicken, with skin, light meat (3 oz/85 g) / 2.5 g   Choose: Chicken, skin removed, light meat (3 oz/85 g) / 1 g  Dairy / Saturated Fat (g)  Avoid: Whole milk (1 cup) / 5 g   Choose: Low-fat milk, 2% (1 cup) / 3 g   Choose: Low-fat milk, 1% (1 cup) / 1.5 g   Choose: Skim milk (1 cup) / 0.3 g   Avoid: Hard cheese (1 oz/28 g) / 6 g   Choose: Skim milk cheese (1 oz/28 g) / 2 to 3 g   Avoid: Cottage cheese, 4% fat (1 cup) / 6.5 g   Choose: Low-fat cottage cheese, 1% fat (1 cup) / 1.5 g   Avoid: Ice cream (1 cup) / 9 g   Choose: Sherbet (1 cup) / 2.5 g   Choose: Nonfat frozen yogurt (1 cup) / 0.3 g  Choose: Frozen fruit bar / trace   Avoid: Whipped cream (1 tbs) / 3.5 g   Choose: Nondairy whipped topping (1 tbs) / 1 g  Condiments / Saturated Fat (g)  Avoid: Mayonnaise (1 tbs) / 2 g   Choose: Low-fat mayonnaise (1 tbs) / 1 g   Avoid: Butter (1 tbs) / 7 g   Choose: Extra light margarine (1 tbs) / 1 g   Avoid: Coconut oil (1 tbs) / 11.8 g   Choose: Olive oil (1 tbs) / 1.8 g   Choose: Corn oil (1 tbs) / 1.7 g   Choose: Safflower oil (1 tbs) / 1.2 g   Choose: Sunflower oil (1 tbs) / 1.4 g   Choose: Soybean oil (1 tbs) / 2.4 g   Choose: Canola oil (1 tbs) / 1 g  Document Released: 06/03/2005 Document Revised: 05/23/2011 Document Reviewed: 11/22/2010 [facility] Patient Information 2012 [place], [place].

## 2011-12-02 NOTE — Assessment & Plan Note (Signed)
Fasting lipid panel today. Continue fish oil. If triglycerides remain elevated will consider fibrate therapy.

## 2011-12-02 NOTE — Assessment & Plan Note (Signed)
Normally under good control. We'll not make changes today. Followup in 2 months. Check usual labs next visit.

## 2011-12-02 NOTE — Assessment & Plan Note (Signed)
Patient desires second opinion for back surgery evaluation. She will call the office with her preferred provider. Continue Neurontin 3 times a day.

## 2011-12-03 ENCOUNTER — Telehealth: Payer: Self-pay | Admitting: Family Medicine

## 2011-12-03 DIAGNOSIS — E119 Type 2 diabetes mellitus without complications: Secondary | ICD-10-CM | POA: Insufficient documentation

## 2011-12-03 MED ORDER — METFORMIN HCL 500 MG PO TABS: 500.0000 mg | ORAL_TABLET | Freq: Two times a day (BID) | ORAL | Status: DC

## 2011-12-03 NOTE — Telephone Encounter (Signed)
Called regarding lab tests. A1c qualifies as new Type II diabetic 6.5. Patient already eats low carbs and exercises, so we discussed starting low dose meformin. Will follow up in 1-2 months. She will also benefit from statin or fish oil for dyslipidemia.

## 2011-12-12 ENCOUNTER — Ambulatory Visit
Admission: RE | Admit: 2011-12-12 | Discharge: 2011-12-12 | Disposition: A | Discharge status: Home / Self Care | Payer: BC Managed Care – PPO | Source: Ambulatory Visit | Attending: Family Medicine | Admitting: Family Medicine

## 2011-12-12 DIAGNOSIS — Z1231 Encounter for screening mammogram for malignant neoplasm of breast: Secondary | ICD-10-CM

## 2011-12-23 ENCOUNTER — Encounter: Payer: Self-pay | Admitting: Family Medicine

## 2011-12-23 ENCOUNTER — Ambulatory Visit (INDEPENDENT_AMBULATORY_CARE_PROVIDER_SITE_OTHER): Payer: BC Managed Care – PPO | Admitting: Family Medicine

## 2011-12-23 ENCOUNTER — Other Ambulatory Visit (HOSPITAL_COMMUNITY)
Admission: RE | Admit: 2011-12-23 | Discharge: 2011-12-23 | Disposition: A | Discharge status: Home / Self Care | Payer: BC Managed Care – PPO | Source: Ambulatory Visit | Attending: Family Medicine | Admitting: Family Medicine

## 2011-12-23 VITALS — BP 145/98 | HR 95 | Temp 97.2°F | Ht 67.5 in | Wt 166.8 lb

## 2011-12-23 DIAGNOSIS — M545 Low back pain: Secondary | ICD-10-CM

## 2011-12-23 DIAGNOSIS — E781 Pure hyperglyceridemia: Secondary | ICD-10-CM

## 2011-12-23 DIAGNOSIS — Z124 Encounter for screening for malignant neoplasm of cervix: Secondary | ICD-10-CM

## 2011-12-23 DIAGNOSIS — E119 Type 2 diabetes mellitus without complications: Secondary | ICD-10-CM

## 2011-12-23 DIAGNOSIS — Z01419 Encounter for gynecological examination (general) (routine) without abnormal findings: Secondary | ICD-10-CM | POA: Insufficient documentation

## 2011-12-23 DIAGNOSIS — I10 Essential (primary) hypertension: Secondary | ICD-10-CM

## 2011-12-23 DIAGNOSIS — E8881 Metabolic syndrome: Secondary | ICD-10-CM | POA: Insufficient documentation

## 2011-12-23 MED ORDER — METFORMIN HCL 500 MG PO TABS: 500.0000 mg | ORAL_TABLET | Freq: Two times a day (BID) | ORAL | Status: DC

## 2011-12-23 MED ORDER — LISINOPRIL 5 MG PO TABS: 10.0000 mg | ORAL_TABLET | Freq: Every day | ORAL | Status: DC

## 2011-12-23 NOTE — Assessment & Plan Note (Addendum)
Above goal. Recommend increase lisinopril to 10 mg. Follow up in 3 months with recheck and labs.

## 2011-12-23 NOTE — Progress Notes (Signed)
  Subjective:    Patient ID: Wanda Baker, female    DOB: 1961/04/02, 51 y.o.   MRN: [id number]  HPI  1. Back pain. Having trouble working long days. Having some conflict with her scheduling. Patient requests referral to Dr. Venetia Maxon for second opinion regarding her back. She denies any weakness, numbness, incontinence.  2. hyperlipidemia. We discussed elevated triglycerides and low HDL in the setting of glucose intolerance. States she has started a low carb diet and does manage to walk nearly daily for exercise. Discussed implications of metabolic syndrome and risk factors for cardiac disease.  3. diabetes mellitus. This is a new diagnosis. She is tolerating metformin 500 mg twice a day. Denies any presyncope, GI effects, fatigue, polyuria, weight changes.  4. hypertension. States her blood pressure is likely elevated due to stress with her job. She seems somewhat upset with her coworkers currently. Is compliant with HCTZ and lisinopril 5 mg. Denies any chest pain, visual changes, and dyspnea.  Review of Systems See HPI otherwise negative.  reports that she quit smoking about 2 years ago. She has never used smokeless tobacco.    Objective:   Physical Exam  Vitals reviewed. Constitutional: She is oriented to person, place, and time. She appears well-developed and well-nourished. No distress.  HENT:  Head: Normocephalic and atraumatic.  Mouth/Throat: Oropharynx is clear and moist.  Eyes: EOM are normal.  Neck: Neck supple.  Pulmonary/Chest: Effort normal.  Abdominal: Soft. There is no tenderness. There is no guarding.       Central obesity  Genitourinary: Vagina normal and uterus normal. No vaginal discharge found.       Cervix appears normal.  Musculoskeletal: She exhibits no edema and no tenderness.  Neurological: She is alert and oriented to person, place, and time. No cranial nerve deficit. She exhibits normal muscle tone. Coordination normal.  Skin: No rash noted. She is not  diaphoretic.  Psychiatric: She has a normal mood and affect.       Assessment & Plan:

## 2011-12-23 NOTE — Assessment & Plan Note (Signed)
Seen by back surgeon referred to by Delbert Harness. No red flags or changes in characteristic today. Patient requests second opinion by a physician she knows through family members, Dr. Venetia Maxon.

## 2011-12-23 NOTE — Assessment & Plan Note (Addendum)
Discussed diagnosis and risk factor modifications. Discussed diet, exercise and weight control. Started metformin recently. LDL is at goal <100, but TG is elevated. Plan to recheck lipids in 6 months and consider pharmacotherapy if still elevated.

## 2011-12-23 NOTE — Patient Instructions (Addendum)
Nice to see you. You have a metabolic syndrome. Important to modify your risk factors to reverse this. Exercise, healthy diet and weight control are the best treatment. Will work on referral for back issues. Make an appointment in 2-3 months for blood pressure check.  Metabolic Syndrome, Adult Metabolic syndrome descibes a group of risk factors for heart disease and diabetes. This syndrome has other names including Insulin Resistance Syndrome. The more risk factors you have, the higher your risk of having a heart attack, stroke, or developing diabetes. These risk factors include:  High blood sugar.   High blood triglyceride (a fat found in the blood) level.   High blood pressure.   Abdominal obesity (your extra weight is around your waist instead of your hips).   Low levels of high-density lipoprotein, HDL (good blood cholesterol).  If you have any three of these risk factors, you have metabolic syndrome. If you have even one of these factors, you should make lifestyle changes to improve your health in order to prevent serious health diseases.  In people with metabolic syndrome, the cells do not respond properly to insulin. This can lead to high levels of glucose in the blood, which can interfere with normal body processes. Eventually, this can cause high blood pressure and higher fat levels in the blood, and inflammation of your blood vessels. The result can be heart disease and stroke.  CAUSES   Eating a diet rich in calories and saturated fat.   Too little physical activity.   Being overweight.  Other underlying causes are:  Family history (genetics).   Ethnicity (South Asians are at a higher risk).   Older age (your chances of developing metabolic syndrome are higher as you grow older).   Insulin resistance.  SYMPTOMS  By itself, metabolic syndrome has no symptoms. However, you might have symptoms of diabetes (high blood sugar) or high blood pressure, such as:  Increased  thirst, urination, and tiredness.   Dizzy spells.   Dull headaches that are unusual for you.   Blurred vision.   Nosebleeds.  DIAGNOSIS  Your caregiver may make a diagnosis of metabolic syndrome if you have at least three of these factors:  If you are overweight mostly around the waist. This means a waistline greater than 40" in men and more than 35" in women. The waistline limits are 31 to 35 inches for women and 37 to 39 inches for men. In those who have certain genetic risk factors, such as having a family history of diabetes or being of Asian descent.   If you have a blood pressure of 130/85 mm Hg or more, or if you are being treated for high blood pressure.   If your blood triglyceride level is 150 mg/dL or more, or you are being treated for high levels of triglyceride.   If the level of HDL in your blood is below 40 mg/dL in men, less than 50 mg/dL in women, or you are receiving treatment for low levels of HDL.   If the level of sugar in your blood is high with fasting blood sugar level of 110 mg/dL or more, or you are under treatment for diabetes.  TREATMENT  Your caregiver may have you make lifestyle changes, which may include:  Exercise.   Losing weight.   Maintaining a healthy diet.   Quitting smoking.  The lifestyle changes listed above are key in reducing your risk for heart disease and stroke. Medicines may also be prescribed to help your body  respond to insulin better and to reduce your blood pressure and blood fat levels. Aspirin may be recommended to reduce risks of heart disease or stroke.  HOME CARE INSTRUCTIONS   Exercise.   Measure your waist at regular intervals just above the hipbones after you have breathed out.   Maintain a healthy diet.   Eat fruits, such as apples, oranges, and pears.   Eat vegetables.   Eat legumes, such as kidney beans, peas, and lentils.   Eat food rich in soluble fiber, such as whole grain cereal, oatmeal, and oat bran.    Use olive or safflower oils and avoid saturated fats.   Eat nuts.   Limit the amount of salt you eat or add to food.   Limit the amount of alcohol you drink.   Include fish in your diet, if possible.   Stop smoking if you are a smoker.   Maintain regular follow-up appointments.   Follow your caregiver's advice.  SEEK MEDICAL CARE IF:   You feel very tired or fatigued.   You develop excessive thirst.   You pass large quantities of urine.   You are putting on weight around your waist rather than losing weight.   You develop headaches over and over again.   You have off-and-on dizzy spells.  SEEK IMMEDIATE MEDICAL CARE IF:   You develop nosebleeds.   You develop sudden blurred vision.   You develop sudden dizzy spells.   You develop chest pains, trouble breathing, or feel an abnormal or irregular heart beat.   You have a fainting episode.   You develop any sudden trouble speaking and/or swallowing.   You develop sudden weakness in one arm and/or one leg.  MAKE SURE YOU:   Understand these instructions.   Will watch your condition.   Will get help right away if you are not doing well or get worse.  Document Released: 09/10/2007 Document Revised: 05/23/2011 Document Reviewed: 09/10/2007 [facility] Patient Information 2012 [place], [place].

## 2011-12-23 NOTE — Assessment & Plan Note (Signed)
LDL at goal with elevated TG. Dicussed intense diet and exercise. Will recheck in 4-6 months after metformin and lifestyle modifications.

## 2011-12-23 NOTE — Assessment & Plan Note (Signed)
No symptoms of hyperglycemia. Follow up in 3 months.

## 2011-12-26 ENCOUNTER — Encounter: Payer: Self-pay | Admitting: Family Medicine

## 2011-12-27 ENCOUNTER — Telehealth: Payer: Self-pay | Admitting: Family Medicine

## 2011-12-27 NOTE — Telephone Encounter (Signed)
Calling concerning appt for Dr. Venetia Maxon about an appt. To be seen about her back. Looked back and referral has been sent in.Loralee Pacas [place]

## 2012-03-24 ENCOUNTER — Ambulatory Visit (INDEPENDENT_AMBULATORY_CARE_PROVIDER_SITE_OTHER): Payer: BC Managed Care – PPO | Admitting: Family Medicine

## 2012-03-24 ENCOUNTER — Encounter: Payer: Self-pay | Admitting: Family Medicine

## 2012-03-24 VITALS — BP 124/76 | HR 91 | Temp 97.7°F | Ht 67.0 in | Wt 159.5 lb

## 2012-03-24 DIAGNOSIS — K219 Gastro-esophageal reflux disease without esophagitis: Secondary | ICD-10-CM

## 2012-03-24 DIAGNOSIS — I10 Essential (primary) hypertension: Secondary | ICD-10-CM

## 2012-03-24 DIAGNOSIS — E8881 Metabolic syndrome: Secondary | ICD-10-CM

## 2012-03-24 DIAGNOSIS — E785 Hyperlipidemia, unspecified: Secondary | ICD-10-CM

## 2012-03-24 DIAGNOSIS — E119 Type 2 diabetes mellitus without complications: Secondary | ICD-10-CM

## 2012-03-24 DIAGNOSIS — Z23 Encounter for immunization: Secondary | ICD-10-CM

## 2012-03-24 DIAGNOSIS — M545 Low back pain: Secondary | ICD-10-CM

## 2012-03-24 LAB — BASIC METABOLIC PANEL
BUN: 12 mg/dL (ref 6–23)
Calcium: 10.2 mg/dL (ref 8.4–10.5)
Glucose, Bld: 105 mg/dL — ABNORMAL HIGH (ref 70–99)

## 2012-03-24 LAB — LIPID PANEL: Cholesterol: 198 mg/dL (ref 0–200)

## 2012-03-24 MED ORDER — OMEPRAZOLE 20 MG PO CPDR: 20.0000 mg | DELAYED_RELEASE_CAPSULE | Freq: Every day | ORAL | Status: DC

## 2012-03-24 NOTE — Assessment & Plan Note (Signed)
Controlled. Change to generic omeprazole.

## 2012-03-24 NOTE — Patient Instructions (Addendum)
Nice to see you! Your diabetes numbers (A1c) and blood pressure are great today! You may eventually not need metformin since you have made such drastic diet changes. You can try to go off metformin or cut down to once daily if you like. Make appointment for check up in 4-6 months.  i will send letter with your results.  Diabetes and Exercise Regular exercise is important and can help:   Control blood glucose (sugar).  Decrease blood pressure.    Control blood lipids (cholesterol, triglycerides).  Improve overall health. BENEFITS FROM EXERCISE  Improved fitness.  Improved flexibility.  Improved endurance.  Increased bone density.  Weight control.  Increased muscle strength.  Decreased body fat.  Improvement of the body's use of insulin, a hormone.  Increased insulin sensitivity.  Reduction of insulin needs.  Reduced stress and tension.  Helps you feel better. People with diabetes who add exercise to their lifestyle gain additional benefits, including:  Weight loss.  Reduced appetite.  Improvement of the body's use of blood glucose.  Decreased risk factors for heart disease:  Lowering of cholesterol and triglycerides.  Raising the level of good cholesterol (high-density lipoproteins, HDL).  Lowering blood sugar.  Decreased blood pressure. TYPE 1 DIABETES AND EXERCISE  Exercise will usually lower your blood glucose.  If blood glucose is greater than 240 mg/dl, check urine ketones. If ketones are present, do not exercise.  Location of the insulin injection sites may need to be adjusted with exercise. Avoid injecting insulin into areas of the body that will be exercised. For example, avoid injecting insulin into:  The arms when playing tennis.  The legs when jogging. For more information, discuss this with your caregiver.  Keep a record of:  Food intake.  Type and amount of exercise.  Expected peak times of insulin action.  Blood glucose  levels. Do this before, during, and after exercise. Review your records with your caregiver. This will help you to develop guidelines for adjusting food intake and insulin amounts.  TYPE 2 DIABETES AND EXERCISE  Regular physical activity can help control blood glucose.  Exercise is important because it may:  Increase the body's sensitivity to insulin.  Improve blood glucose control.  Exercise reduces the risk of heart disease. It decreases serum cholesterol and triglycerides. It also lowers blood pressure.  Those who take insulin or oral hypoglycemic agents should watch for signs of hypoglycemia. These signs include dizziness, shaking, sweating, chills, and confusion.  Body water is lost during exercise. It must be replaced. This will help to avoid loss of body fluids (dehydration) or heat stroke. Be sure to talk to your caregiver before starting an exercise program to make sure it is safe for you. Remember, any activity is better than none.  Document Released: 08/24/2003 Document Revised: 08/26/2011 Document Reviewed: 12/08/2008 [facility] Patient Information 2013 [place], [place].

## 2012-03-24 NOTE — Assessment & Plan Note (Signed)
A1c is low. She may not need metformin with weight loss and diet changes. Advised she may try off the medication or decrease to once daily. F/u in 4-6 months.

## 2012-03-24 NOTE — Progress Notes (Signed)
  Subjective:    Patient ID: Wanda Baker, female    DOB: 1961-03-17, 51 y.o.   MRN: [id number]  HPI  1. DM2. Started metformin few months ago. Also started drastic diet changes, avoiding carbs/sweets. She has lost few lbs also.   2. Back pain. Is under care of Dr. Venetia Maxon now, plans for spinal injection of a bone spur. Taking neurontin for pain. Is trying to walk/do exercise but is limited currently, has cut back on lifting at her job.  3. GERD. Requests rx for cheaper PPI (nexium is cost-prohibitive). Symptoms stable.   4. HTN. Started ACEi with HCTZ. Control is good recently 120s/70s. Denies vision problems, leg edema, dyspnea, urinary changes.  Review of Systems See HPI otherwise negative.  reports that she quit smoking about 2 years ago. She has never used smokeless tobacco.     Objective:   Physical Exam  Vitals reviewed. Constitutional: She is oriented to person, place, and time. She appears well-developed and well-nourished. No distress.  HENT:  Head: Normocephalic and atraumatic.  Mouth/Throat: Oropharynx is clear and moist.  Eyes: EOM are normal.  Cardiovascular: Normal rate, regular rhythm and normal heart sounds.   No murmur heard. Pulmonary/Chest: Effort normal and breath sounds normal. No respiratory distress. She has no wheezes. She has no rales.  Musculoskeletal: She exhibits no edema and no tenderness.       No ulcers or decrease sensation in feet.  Neurological: She is alert and oriented to person, place, and time.  Skin: No rash noted. She is not diaphoretic.  Psychiatric: She has a normal mood and affect.      Assessment & Plan:

## 2012-03-24 NOTE — Assessment & Plan Note (Signed)
She is at goal LDL borderline. Consider starting statin in future if deteriorates. Suspect she will continue having good control with dietary change.

## 2012-03-24 NOTE — Assessment & Plan Note (Signed)
Good weight loss with lifestyle modifications. Follow up lipid panel today.

## 2012-03-24 NOTE — Assessment & Plan Note (Signed)
At goal on ACEi/HCTZ. Will check BMET. Change to combination pill if all is well next time refill is needed.

## 2012-03-25 ENCOUNTER — Encounter: Payer: Self-pay | Admitting: Family Medicine

## 2012-05-29 ENCOUNTER — Telehealth: Payer: Self-pay | Admitting: Family Medicine

## 2012-05-29 NOTE — Telephone Encounter (Signed)
Ms. Cunning want some one to contact her back to advise what to take for a cold.  She has tried Cor[place], but it made her ill.  Need to try something else.

## 2012-05-29 NOTE — Telephone Encounter (Signed)
Patient reports cough, congestion, sore throat  since yesterday. No fever. Advised she can try mucinex (plain ) OTC . If continues with symptoms or worsens call for appointment.  She voices understanding.

## 2012-06-02 ENCOUNTER — Other Ambulatory Visit: Payer: Self-pay | Admitting: Family Medicine

## 2012-06-26 ENCOUNTER — Other Ambulatory Visit: Payer: Self-pay | Admitting: Family Medicine

## 2012-07-16 ENCOUNTER — Ambulatory Visit (INDEPENDENT_AMBULATORY_CARE_PROVIDER_SITE_OTHER): Payer: BC Managed Care – PPO | Admitting: Family Medicine

## 2012-07-16 ENCOUNTER — Encounter: Payer: Self-pay | Admitting: Family Medicine

## 2012-07-16 ENCOUNTER — Ambulatory Visit: Payer: BC Managed Care – PPO | Admitting: Family Medicine

## 2012-07-16 VITALS — BP 124/77 | HR 106 | Temp 98.6°F | Ht 67.5 in | Wt 154.0 lb

## 2012-07-16 DIAGNOSIS — Z23 Encounter for immunization: Secondary | ICD-10-CM

## 2012-07-16 DIAGNOSIS — I10 Essential (primary) hypertension: Secondary | ICD-10-CM

## 2012-07-16 DIAGNOSIS — E781 Pure hyperglyceridemia: Secondary | ICD-10-CM

## 2012-07-16 DIAGNOSIS — E119 Type 2 diabetes mellitus without complications: Secondary | ICD-10-CM

## 2012-07-16 DIAGNOSIS — E785 Hyperlipidemia, unspecified: Secondary | ICD-10-CM

## 2012-07-16 MED ORDER — ATORVASTATIN CALCIUM 20 MG PO TABS: 20.0000 mg | ORAL_TABLET | Freq: Every day | ORAL | Status: DC

## 2012-07-16 NOTE — Patient Instructions (Addendum)
Nice to see you. lipitor might help your cholesterol/triglycerides. Your blood pressure naturally gets a little high with stress/anxiety. It is a good range usually, so don't worry too much. You are doing great on your weight and diet. You may cure yourself of diabetes and can try you off medicine. Make appointment in 2 months for cholesterol and BP check.  Make sure you are fasting when we recheck labs.

## 2012-07-17 NOTE — Assessment & Plan Note (Signed)
Borderline above goal in a diabetic patient. Discussed risks/benefits and she desires treatment though she is a new diagnosis, probably below threshold for DM2 currently with her weight loss, and ultimately not the highest risk category. Will start atorvastatin and f/u FLP in 2-3 months.

## 2012-07-17 NOTE — Assessment & Plan Note (Signed)
At goal today. Reassured patient normal fluctuation to stress and pain. Continue meds as prescribed. F/u in 2-3 months.

## 2012-07-17 NOTE — Progress Notes (Signed)
  Subjective:    Patient ID: Wanda Baker, female    DOB: [birth year], 52 y.o.   MRN: [id number]  HPI  1. DM2. Patient taking her diet seriously, has lost 15 lbs since diagnosis 6 months ago. Taking metformin without side effects.  2. Hypertriglyceridemia. Wonders if she needs to be on cholesterol medication. No hx of CVD, nonsmoker. She does have dx of metabolic syndrome. Lab Results  Component Value Date   CHOL 198 03/24/2012   HDL 37* 03/24/2012   LDLCALC 105* 03/24/2012   TRIG 281* 03/24/2012   CHOLHDL 5.4 03/24/2012   3. HTN. Patient worried because her BP at the neurosurgeon's office was 148/88 today which is higher than usual. She was nervous because she was getting injections and had some pain also. Compliant on lisinopril and HCTZ. No chest pain, edema, dyspnea, cough.   Review of Systems See HPI otherwise negative.  reports that she quit smoking about 2 years ago. She has never used smokeless tobacco.     Objective:   Physical Exam  Vitals reviewed. Constitutional: She is oriented to person, place, and time. She appears well-developed and well-nourished. No distress.  HENT:  Head: Normocephalic and atraumatic.  Eyes: EOM are normal. Pupils are equal, round, and reactive to light.  Neck: Neck supple. No thyromegaly present.  Cardiovascular: Normal rate, regular rhythm and normal heart sounds.   Pulmonary/Chest: Effort normal and breath sounds normal. No respiratory distress. She has no wheezes.  Musculoskeletal: She exhibits no edema.  Neurological: She is alert and oriented to person, place, and time.  Skin: She is not diaphoretic.  Psychiatric: She has a normal mood and affect.          Assessment & Plan:

## 2012-07-17 NOTE — Assessment & Plan Note (Signed)
Will re-assess A1c next visit, suspect will remain low and perhaps can wean metformin given the new motivation with lifestyle factors.

## 2012-08-13 ENCOUNTER — Ambulatory Visit
Admission: RE | Admit: 2012-08-13 | Discharge: 2012-08-13 | Disposition: A | Discharge status: Home / Self Care | Payer: BC Managed Care – PPO | Source: Ambulatory Visit | Attending: Family Medicine | Admitting: Family Medicine

## 2012-08-13 ENCOUNTER — Encounter: Payer: Self-pay | Admitting: Family Medicine

## 2012-08-13 ENCOUNTER — Ambulatory Visit (INDEPENDENT_AMBULATORY_CARE_PROVIDER_SITE_OTHER): Payer: BC Managed Care – PPO | Admitting: Family Medicine

## 2012-08-13 VITALS — BP 130/77 | HR 103 | Temp 99.0°F | Ht 67.5 in | Wt 155.0 lb

## 2012-08-13 DIAGNOSIS — M25519 Pain in unspecified shoulder: Secondary | ICD-10-CM

## 2012-08-13 DIAGNOSIS — M25512 Pain in left shoulder: Secondary | ICD-10-CM

## 2012-08-13 NOTE — Assessment & Plan Note (Signed)
Again exam seems consistent with trapezial muscle strain. There no red flags such as weakness, numbness or neurologic deficits. 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 Will check cervical plain film and shoulder films to seem in any arthritic changes. At this point recommend conservative management with heat, muscle relaxant, NSAID for short term. Continue range of motion exercises. Followup in 2 weeks, consider referral to sports medicine for orthopedics if rotator cuff seems to be more of an issue.

## 2012-08-13 NOTE — Patient Instructions (Addendum)
Nice to see you. You may have muscle train or rotator cuff issues. Will check xrays. You may use flexeril, tylenol for pain. Important to keep range of motion. Heat or ice can help. Make appointment if not improving in next 1-2 weeks.  Muscle Strain Muscle strain occurs when a muscle is stretched beyond its normal length. A small number of muscle fibers generally are torn. This is especially common in athletes. This happens when a sudden, violent force placed on a muscle stretches it too far. Usually, recovery from muscle strain takes 1 to 2 weeks. Complete healing will take 5 to 6 weeks.  HOME CARE INSTRUCTIONS   While awake, apply ice to the sore muscle for the first 2 days after the injury.  Put ice in a plastic bag.  Place a towel between your skin and the bag.  Leave the ice on for 15 to 20 minutes each hour.  Do not use the strained muscle for several days, until you no longer have pain.  You may wrap the injured area with an elastic bandage for comfort. Be careful not to wrap it too tightly. This may interfere with blood circulation or increase swelling.  Only take over-the-counter or prescription medicines for pain, discomfort, or fever as directed by your caregiver. SEEK MEDICAL CARE IF:  You have increasing pain or swelling in the injured area. MAKE SURE YOU:   Understand these instructions.  Will watch your condition.  Will get help right away if you are not doing well or get worse. Document Released: 06/03/2005 Document Revised: 08/26/2011 Document Reviewed: 06/15/2011 [facility] Patient Information 2013 [place], [place].

## 2012-08-13 NOTE — Progress Notes (Signed)
  Subjective:    Patient ID: Wanda Baker, female    DOB: 04/04/1961, 52 y.o.   MRN: [id number]  Shoulder Pain     1. Left shoulder pain. Present intermittently for 2 years. First noted in 2012. She was diagnosed with trapezius muscle strain at this time and it resolved spontaneously. Currently she is having the left shoulder pain again for about 2 weeks, seems to radiate from her neck area. She denies any exacerbating activities. She does work in [employer], and has to Company secretary and products.  It only hurts her when she tries to lift heavy objects. She has noticed difficulty using the arm at work due to the pain. This improves when she goes to sleep.  Review of Systems She denies numbness, dropping objects, edema, or pain in her hands, trauma, surgery in this area.    Objective:   Physical Exam  Vitals reviewed. Constitutional: She appears well-developed and well-nourished. No distress.  HENT:  Head: Normocephalic and atraumatic.  Mouth/Throat: Oropharynx is clear and moist.  Eyes: EOM are normal.  Neck: Neck supple.  Significant cervical kyphosis noted. Range of motion intact.  test negative. She does have pain in the left trapezial area with lateral flexion in both directions.  Pulmonary/Chest: Effort normal.  Musculoskeletal:  Out of 5 bilateral grip strength. Sensation intact bilateral upper extremities. She notes a slight discomfort with rotator cuff maneuvers, but no weakness negative. There is significant tenderness to palpation in the left trapezial distribution. No trigger points. No sinus tenderness to palpation.  Skin: No rash noted. She is not diaphoretic.  Psychiatric: She has a normal mood and affect.          Assessment & Plan:

## 2012-08-18 ENCOUNTER — Encounter: Payer: Self-pay | Admitting: Family Medicine

## 2012-08-19 ENCOUNTER — Encounter: Payer: Self-pay | Admitting: Family Medicine

## 2012-08-19 ENCOUNTER — Ambulatory Visit (INDEPENDENT_AMBULATORY_CARE_PROVIDER_SITE_OTHER): Payer: BC Managed Care – PPO | Admitting: Family Medicine

## 2012-08-19 VITALS — BP 125/72 | HR 97 | Temp 98.3°F | Ht 67.0 in | Wt 154.4 lb

## 2012-08-19 DIAGNOSIS — J019 Acute sinusitis, unspecified: Secondary | ICD-10-CM

## 2012-08-19 MED ORDER — BENZONATATE 200 MG PO CAPS: 200.0000 mg | ORAL_CAPSULE | Freq: Three times a day (TID) | ORAL | Status: DC | PRN

## 2012-08-19 MED ORDER — AZITHROMYCIN 250 MG PO TABS: ORAL_TABLET | ORAL | Status: DC

## 2012-08-19 NOTE — Patient Instructions (Signed)
I think you have a viral cough or an early sinusitis. Try to take tessalon. Drink plenty of fluids, rest. Start the antibiotic (azithromycin) in 2 days if not improving. Or if you have fever, facial pain, shortness of breath notify MD.  Sinusitis Sinusitis is redness, soreness, and swelling (inflammation) of the paranasal sinuses. Paranasal sinuses are air pockets within the bones of your face (beneath the eyes, the middle of the forehead, or above the eyes). In healthy paranasal sinuses, mucus is able to drain out, and air is able to circulate through them by way of your nose. However, when your paranasal sinuses are inflamed, mucus and air can become trapped. This can allow bacteria and other germs to grow and cause infection. Sinusitis can develop quickly and last only a short time (acute) or continue over a long period (chronic). Sinusitis that lasts for more than 12 weeks is considered chronic.  CAUSES  Causes of sinusitis include:  Allergies.  Structural abnormalities, such as displacement of the cartilage that separates your nostrils (deviated septum), which can decrease the air flow through your nose and sinuses and affect sinus drainage.  Functional abnormalities, such as when the small hairs (cilia) that line your sinuses and help remove mucus do not work properly or are not present. SYMPTOMS  Symptoms of acute and chronic sinusitis are the same. The primary symptoms are pain and pressure around the affected sinuses. Other symptoms include:  Upper toothache.  Earache.  Headache.  Bad breath.  Decreased sense of smell and taste.  A cough, which worsens when you are lying flat.  Fatigue.  Fever.  Thick drainage from your nose, which often is green and may contain pus (purulent).  Swelling and warmth over the affected sinuses. DIAGNOSIS  Your caregiver will perform a physical exam. During the exam, your caregiver may:  Look in your nose for signs of abnormal growths  in your nostrils (nasal polyps).  Tap over the affected sinus to check for signs of infection.  View the inside of your sinuses (endoscopy) with a special imaging device with a light attached (endoscope), which is inserted into your sinuses. If your caregiver suspects that you have chronic sinusitis, one or more of the following tests may be recommended:  Allergy tests.  Nasal culture A sample of mucus is taken from your nose and sent to a lab and screened for bacteria.  Nasal cytology A sample of mucus is taken from your nose and examined by your caregiver to determine if your sinusitis is related to an allergy. TREATMENT  Most cases of acute sinusitis are related to a viral infection and will resolve on their own within 10 days. Sometimes medicines are prescribed to help relieve symptoms (pain medicine, decongestants, nasal steroid sprays, or saline sprays).  However, for sinusitis related to a bacterial infection, your caregiver will prescribe antibiotic medicines. These are medicines that will help kill the bacteria causing the infection.  Rarely, sinusitis is caused by a fungal infection. In theses cases, your caregiver will prescribe antifungal medicine. For some cases of chronic sinusitis, surgery is needed. Generally, these are cases in which sinusitis recurs more than 3 times per year, despite other treatments. HOME CARE INSTRUCTIONS   Drink plenty of water. Water helps thin the mucus so your sinuses can drain more easily.  Use a humidifier.  Inhale steam 3 to 4 times a day (for example, sit in the bathroom with the shower running).  Apply a warm, moist washcloth to your face 3  to 4 times a day, or as directed by your caregiver.  Use saline nasal sprays to help moisten and clean your sinuses.  Take over-the-counter or prescription medicines for pain, discomfort, or fever only as directed by your caregiver. SEEK IMMEDIATE MEDICAL CARE IF:  You have increasing pain or severe  headaches.  You have nausea, vomiting, or drowsiness.  You have swelling around your face.  You have vision problems.  You have a stiff neck.  You have difficulty breathing. MAKE SURE YOU:   Understand these instructions.  Will watch your condition.  Will get help right away if you are not doing well or get worse. Document Released: 06/03/2005 Document Revised: 08/26/2011 Document Reviewed: 06/18/2011 [facility] Patient Information 2013 [place], [place].

## 2012-08-19 NOTE — Assessment & Plan Note (Signed)
Likely viral at this point. No fever, purulent rhinorrhea. She is not improving at 7 days, so could also be early bacterial sinusitis. We discussed avoidance of unnecessary abx and supportive care. Recommend tessalon perles, mucinex, OTC decongestants. Rest, fluids. Given rx for azithromycin to start in 2-3 days if worsening or not improving. She agrees to notify MD of worsening or failure to improve. F/u prn.

## 2012-08-19 NOTE — Progress Notes (Signed)
  Subjective:    Patient ID: Wanda Baker, female    DOB: 12-10-1960, 52 y.o.   MRN: [id number]  URI     1. Cough x 7 days. Usually nonproductive, but occasionally some clear sputum. She is hoarse, slight sore throat, head congestion, frontal headache, mild rhinorrhea. Symptoms are stable, not improving or worsening. There is a URI going around work. She stayed home because she works in [employer] and didn't want to infect. Tried mucinex helped somewhat. She denies wheezing, dyspnea, purulent rhinorrhea, fever, chills, rash, ear pain. Nonsmoker.  Review of Systems See HPI otherwise negative.  reports that she quit smoking about 2 years ago. She has never used smokeless tobacco.     Objective:   Physical Exam  Vitals reviewed. Constitutional: She is oriented to person, place, and time. She appears well-developed and well-nourished. No distress.  Hoarse. Darkness under eyes  HENT:  Head: Normocephalic and atraumatic.  Right Ear: External ear normal.  Nose: Nose normal.  Mouth/Throat: Oropharynx is clear and moist. No oropharyngeal exudate.  Slight pharyngeal erythema. No exudate.  Eyes: EOM are normal. Pupils are equal, round, and reactive to light. Right eye exhibits no discharge. Left eye exhibits no discharge.  Neck: Normal range of motion. Neck supple. No tracheal deviation present.  Cardiovascular: Normal rate, regular rhythm and normal heart sounds.   No murmur heard. Pulmonary/Chest: Effort normal and breath sounds normal. No respiratory distress. She has no wheezes. She has no rales.  Musculoskeletal: She exhibits no edema.  Lymphadenopathy:    She has no cervical adenopathy.  Neurological: She is alert and oriented to person, place, and time.  Skin: She is not diaphoretic.  Psychiatric: She has a normal mood and affect.        Assessment & Plan:

## 2012-09-03 ENCOUNTER — Encounter: Payer: Self-pay | Admitting: Family Medicine

## 2012-09-03 ENCOUNTER — Ambulatory Visit (INDEPENDENT_AMBULATORY_CARE_PROVIDER_SITE_OTHER): Payer: BC Managed Care – PPO | Admitting: Family Medicine

## 2012-09-03 VITALS — BP 144/82 | HR 91 | Temp 98.9°F | Ht 67.5 in | Wt 155.0 lb

## 2012-09-03 DIAGNOSIS — R05 Cough: Secondary | ICD-10-CM

## 2012-09-03 DIAGNOSIS — E8881 Metabolic syndrome: Secondary | ICD-10-CM

## 2012-09-03 DIAGNOSIS — J019 Acute sinusitis, unspecified: Secondary | ICD-10-CM

## 2012-09-03 LAB — COMPREHENSIVE METABOLIC PANEL
ALT: 16 U/L (ref 0–35)
CO2: 30 mEq/L (ref 19–32)
Creat: 0.57 mg/dL (ref 0.50–1.10)
Total Bilirubin: 0.5 mg/dL (ref 0.3–1.2)

## 2012-09-03 LAB — LIPID PANEL
HDL: 40 mg/dL (ref >39)
LDL Cholesterol: 47 mg/dL (ref 0–99)
Total CHOL/HDL Ratio: 2.9 Ratio
Triglycerides: 151 mg/dL — ABNORMAL HIGH (ref <150)
VLDL: 30 mg/dL (ref 0–40)

## 2012-09-03 MED ORDER — GUAIFENESIN-CODEINE 200-20 MG/5ML PO LIQD: 5.0000 mL | Freq: Four times a day (QID) | ORAL | Status: DC | PRN

## 2012-09-03 MED ORDER — BENZONATATE 200 MG PO CAPS: 200.0000 mg | ORAL_CAPSULE | Freq: Three times a day (TID) | ORAL | Status: DC | PRN

## 2012-09-03 NOTE — Progress Notes (Signed)
  Subjective:    Patient ID: Wanda Baker, female    DOB: February 13, 1961, 52 y.o.   MRN: [id number]  HPI  1. Cough. Has continued with a relatively dry cough since diagnosed with sinusitis 2 weeks and completing antibiotics. Tessalon seemed to help a little bit, though she ran out. She notices associated watery eyes, runny nose, nasal congestion. Thinks this is related to sinuses. She is taking an antihistamine.  Denies any dyspnea, chest pain, malaise, fatigue, facial pain, fever, chills, dysphagia.  2. Hyperlipidemia/metabolic syndrome. Patient is fasting today for repeat lipid panel after we started her on Lipitor 3 months ago.  Review of Systems See HPI otherwise negative.  reports that she quit smoking about 3 years ago. She has never used smokeless tobacco.     Objective:   Physical Exam  Vitals reviewed. Constitutional: She appears well-developed and well-nourished. No distress.  Repeated episodes of coughing during exam.  HENT:  Head: Normocephalic and atraumatic.  Right Ear: External ear normal.  Left Ear: External ear normal.  faint pharyngeal erythema. No exudates, no sinus tenderness.   nasal mucosa edematous, no polyps noted  Eyes: Right eye exhibits no discharge. Left eye exhibits no discharge.  Tearing. Conjunctiva injected.  Neck: Normal range of motion. Neck supple.  Cardiovascular: Normal rate, regular rhythm and normal heart sounds.   Pulmonary/Chest: Effort normal and breath sounds normal. No stridor. No respiratory distress. She has no wheezes. She has no rales.  Lymphadenopathy:    She has no cervical adenopathy.  Skin: She is not diaphoretic.          Assessment & Plan:

## 2012-09-03 NOTE — Patient Instructions (Addendum)
I think you have irritation from allergies or post-virus.  Start nasal steroid. Pick up some nasacort from over the counter to try.  Keep taking antihistamine for allergies. Use nasal saline. Try cough syrup or tessalon. Make appointment if not better in 2-3 weeks or additional symptoms develop.  Cough, Adult  A cough is a reflex that helps clear your throat and airways. It can help heal the body or may be a reaction to an irritated airway. A cough may only last 2 or 3 weeks (acute) or may last more than 8 weeks (chronic).  CAUSES Acute cough:  Viral or bacterial infections. Chronic cough:  Infections.  Allergies.  Asthma.  Post-nasal drip.  Smoking.  Heartburn or acid reflux.  Some medicines.  Chronic lung problems (COPD).  Cancer. SYMPTOMS   Cough.  Fever.  Chest pain.  Increased breathing rate.  High-pitched whistling sound when breathing (wheezing).  Colored mucus that you cough up (sputum). TREATMENT   A bacterial cough may be treated with antibiotic medicine.  A viral cough must run its course and will not respond to antibiotics.  Your caregiver may recommend other treatments if you have a chronic cough. HOME CARE INSTRUCTIONS   Only take over-the-counter or prescription medicines for pain, discomfort, or fever as directed by your caregiver. Use cough suppressants only as directed by your caregiver.  Use a cold steam vaporizer or humidifier in your bedroom or home to help loosen secretions.  Sleep in a semi-upright position if your cough is worse at night.  Rest as needed.  Stop smoking if you smoke. SEEK IMMEDIATE MEDICAL CARE IF:   You have pus in your sputum.  Your cough starts to worsen.  You cannot control your cough with suppressants and are losing sleep.  You begin coughing up blood.  You have difficulty breathing.  You develop pain which is getting worse or is uncontrolled with medicine.  You have a fever. MAKE SURE YOU:    Understand these instructions.  Will watch your condition.  Will get help right away if you are not doing well or get worse. Document Released: 11/30/2010 Document Revised: 08/26/2011 Document Reviewed: 11/30/2010 [facility] Patient Information 2013 [place], [place].

## 2012-09-03 NOTE — Assessment & Plan Note (Signed)
Persistent for 2 weeks following sinusitis. Most likely this is a post viral or throat iritation following her infection, also could be related to allergies. No exam findings or systemic signs concerning for pneumonia or pulmonary issues. Recommend continuing antihistamine. Add nasal steroid, since she did not tolerate Flonase previously due to causing headache therefore recommend trial of Nasacort OTC. Refill Tessalon, prn cough syrup. Followup in 2-3 weeks if not improving, would pursuit imaging at this time if not resolved.

## 2012-09-04 ENCOUNTER — Encounter: Payer: Self-pay | Admitting: Family Medicine

## 2012-09-17 ENCOUNTER — Encounter: Payer: Self-pay | Admitting: Family Medicine

## 2012-09-17 ENCOUNTER — Ambulatory Visit (INDEPENDENT_AMBULATORY_CARE_PROVIDER_SITE_OTHER): Payer: BC Managed Care – PPO | Admitting: Family Medicine

## 2012-09-17 VITALS — BP 124/77 | HR 97 | Temp 97.5°F | Ht 67.0 in | Wt 157.9 lb

## 2012-09-17 DIAGNOSIS — R05 Cough: Secondary | ICD-10-CM

## 2012-09-17 MED ORDER — LEVOFLOXACIN 500 MG PO TABS: 500.0000 mg | ORAL_TABLET | Freq: Every day | ORAL | Status: DC

## 2012-09-17 NOTE — Progress Notes (Signed)
  Subjective:    Patient ID: Wanda Baker, female    DOB: [birth year], 51 y.o.   MRN: [id number]  URI  Associated symptoms include ear pain.  Otalgia     1. F/u cough/sinus congestion. He continues to have severe left greater than right facial congestion and postnasal drip causing throat irritation. There is left ear discomfort and watery eyes. The nonproductive cough is slightly improved however does persist. Symptoms now going on 3-4 weeks. She has already completed and azithromycin course. She did start Flonase and Zyrtec as recommended. She denies any fever, dyspnea, wheezing, nausea, emesis, lightheadedness, hearing loss, chest pain, chest congestion, smoke exposure.  Review of Systems  HENT: Positive for ear pain.    See HPI otherwise negative.  reports that she quit smoking about 3 years ago. She has never used smokeless tobacco.     Objective:   Physical Exam  Vitals reviewed. Constitutional: She is oriented to person, place, and time. She appears well-developed and well-nourished. No distress.  HENT:  Left maxillary sinus TTP.  Eyes tearing slightly. TMs appear wnl, slight dull. No cervical LAD.  Eyes: Conjunctivae and EOM are normal.  Neck: Normal range of motion. Neck supple. No thyromegaly present.  Cardiovascular: Normal rate, regular rhythm and normal heart sounds.   No murmur heard. Pulmonary/Chest: Breath sounds normal. No respiratory distress. She has no wheezes. She has no rales.  Lymphadenopathy:    She has no cervical adenopathy.  Neurological: She is alert and oriented to person, place, and time.  Skin: She is not diaphoretic.       Assessment & Plan:

## 2012-09-17 NOTE — Patient Instructions (Signed)
You seem to have sinusitis, not improving with conservative treatment. Start taking levaquin for 7 days. Keep taking flonase, cetirizine.  Start nasal saline 3-4 times per day.  Make appointment for check up in 2 weeks.  Sinusitis Sinusitis is redness, soreness, and swelling (inflammation) of the paranasal sinuses. Paranasal sinuses are air pockets within the bones of your face (beneath the eyes, the middle of the forehead, or above the eyes). In healthy paranasal sinuses, mucus is able to drain out, and air is able to circulate through them by way of your nose. However, when your paranasal sinuses are inflamed, mucus and air can become trapped. This can allow bacteria and other germs to grow and cause infection. Sinusitis can develop quickly and last only a short time (acute) or continue over a long period (chronic). Sinusitis that lasts for more than 12 weeks is considered chronic.  CAUSES  Causes of sinusitis include:  Allergies.  Structural abnormalities, such as displacement of the cartilage that separates your nostrils (deviated septum), which can decrease the air flow through your nose and sinuses and affect sinus drainage.  Functional abnormalities, such as when the small hairs (cilia) that line your sinuses and help remove mucus do not work properly or are not present. SYMPTOMS  Symptoms of acute and chronic sinusitis are the same. The primary symptoms are pain and pressure around the affected sinuses. Other symptoms include:  Upper toothache.  Earache.  Headache.  Bad breath.  Decreased sense of smell and taste.  A cough, which worsens when you are lying flat.  Fatigue.  Fever.  Thick drainage from your nose, which often is green and may contain pus (purulent).  Swelling and warmth over the affected sinuses. DIAGNOSIS  Your caregiver will perform a physical exam. During the exam, your caregiver may:  Look in your nose for signs of abnormal growths in your nostrils  (nasal polyps).  Tap over the affected sinus to check for signs of infection.  View the inside of your sinuses (endoscopy) with a special imaging device with a light attached (endoscope), which is inserted into your sinuses. If your caregiver suspects that you have chronic sinusitis, one or more of the following tests may be recommended:  Allergy tests.  Nasal culture A sample of mucus is taken from your nose and sent to a lab and screened for bacteria.  Nasal cytology A sample of mucus is taken from your nose and examined by your caregiver to determine if your sinusitis is related to an allergy. TREATMENT  Most cases of acute sinusitis are related to a viral infection and will resolve on their own within 10 days. Sometimes medicines are prescribed to help relieve symptoms (pain medicine, decongestants, nasal steroid sprays, or saline sprays).  However, for sinusitis related to a bacterial infection, your caregiver will prescribe antibiotic medicines. These are medicines that will help kill the bacteria causing the infection.  Rarely, sinusitis is caused by a fungal infection. In theses cases, your caregiver will prescribe antifungal medicine. For some cases of chronic sinusitis, surgery is needed. Generally, these are cases in which sinusitis recurs more than 3 times per year, despite other treatments. HOME CARE INSTRUCTIONS   Drink plenty of water. Water helps thin the mucus so your sinuses can drain more easily.  Use a humidifier.  Inhale steam 3 to 4 times a day (for example, sit in the bathroom with the shower running).  Apply a warm, moist washcloth to your face 3 to 4 times a day,  or as directed by your caregiver.  Use saline nasal sprays to help moisten and clean your sinuses.  Take over-the-counter or prescription medicines for pain, discomfort, or fever only as directed by your caregiver. SEEK IMMEDIATE MEDICAL CARE IF:  You have increasing pain or severe headaches.  You  have nausea, vomiting, or drowsiness.  You have swelling around your face.  You have vision problems.  You have a stiff neck.  You have difficulty breathing. MAKE SURE YOU:   Understand these instructions.  Will watch your condition.  Will get help right away if you are not doing well or get worse. Document Released: 06/03/2005 Document Revised: 08/26/2011 Document Reviewed: 06/18/2011 [facility] Patient Information 2013 [place], [place].

## 2012-09-17 NOTE — Assessment & Plan Note (Signed)
Persistent sinusitis symptoms after completion of azithromycin course 3 weeks ago. Some allergic component also. With continued PND and sinus tenderness, will rx levaquin (PCN allergic) for 7 days. Continue nasal steroid and antihistamine. No pulmonary symptoms to suggest lower airway lesion/infection. F/u in 2-3 weeks and consider ENT referral if still symptomatic.

## 2012-10-01 ENCOUNTER — Ambulatory Visit (INDEPENDENT_AMBULATORY_CARE_PROVIDER_SITE_OTHER): Payer: BC Managed Care – PPO | Admitting: Family Medicine

## 2012-10-01 ENCOUNTER — Encounter: Payer: Self-pay | Admitting: Family Medicine

## 2012-10-01 VITALS — BP 133/77 | HR 106 | Ht 67.0 in | Wt 159.0 lb

## 2012-10-01 DIAGNOSIS — S8990XA Unspecified injury of unspecified lower leg, initial encounter: Secondary | ICD-10-CM

## 2012-10-01 DIAGNOSIS — S99921A Unspecified injury of right foot, initial encounter: Secondary | ICD-10-CM

## 2012-10-01 DIAGNOSIS — E119 Type 2 diabetes mellitus without complications: Secondary | ICD-10-CM

## 2012-10-01 DIAGNOSIS — K219 Gastro-esophageal reflux disease without esophagitis: Secondary | ICD-10-CM

## 2012-10-01 LAB — POCT GLYCOSYLATED HEMOGLOBIN (HGB A1C): Hemoglobin A1C: 5.9

## 2012-10-01 MED ORDER — OMEPRAZOLE 20 MG PO CPDR: 20.0000 mg | DELAYED_RELEASE_CAPSULE | Freq: Every day | ORAL | Status: DC

## 2012-10-01 NOTE — Patient Instructions (Addendum)
Your A1c is good today.  Get your eyes checked every year for diabetes. Keep your toe with ointment and band-aid covered.  Let me know if doesn't get better.    Diabetes and Foot Care Diabetes may cause you to have a poor blood supply (circulation) to your legs and feet. Because of this, the skin may be thinner, break easier, and heal more slowly. You also may have nerve damage in your legs and feet causing decreased feeling. You may not notice minor injuries to your feet that could lead to serious problems or infections. Taking care of your feet is one of the most important things you can do for yourself.  HOME CARE INSTRUCTIONS  Do not go barefoot. Bare feet are easily injured.  Check your feet daily for blisters, cuts, and redness.  Wash your feet with warm water (not hot) and mild soap. Pat your feet and between your toes until completely dry.  Apply a moisturizing lotion that does not contain alcohol or petroleum jelly to the dry skin on your feet and to dry brittle toenails. Do not put it between your toes.  Trim your toenails straight across. Do not dig under them or around the cuticle.  Do not cut corns or calluses, or try to remove them with medicine.  Wear clean cotton socks or stockings every day. Make sure they are not too tight. Do not wear knee high stockings since they may decrease blood flow to your legs.  Wear leather shoes that fit properly and have enough cushioning. To break in new shoes, wear them just a few hours a day to avoid injuring your feet.  Wear shoes at all times, even in the house.  Do not cross your legs. This may decrease the blood flow to your feet.  If you find a minor scrape, cut, or break in the skin on your feet, keep it and the skin around it clean and dry. These areas may be cleansed with mild soap and water. Do not use peroxide, alcohol, iodine or Merthiolate.  When you remove an adhesive bandage, be sure not to harm the skin around it.  If  you have a wound, look at it several times a day to make sure it is healing.  Do not use heating pads or hot water bottles. Burns can occur. If you have lost feeling in your feet or legs, you may not know it is happening until it is too late.  Report any cuts, sores or bruises to your caregiver. Do not wait! SEEK MEDICAL CARE IF:   You have an injury that is not healing or you notice redness, numbness, burning, or tingling.  Your feet always feel cold.  You have pain or cramps in your legs and feet. SEEK IMMEDIATE MEDICAL CARE IF:   There is increasing redness, swelling, or increasing pain in the wound.  There is a red line that goes up your leg.  Pus is coming from a wound.  You develop an unexplained oral temperature above 102 F (38.9 C), or as your caregiver suggests.  You notice a bad smell coming from an ulcer or wound. MAKE SURE YOU:   Understand these instructions.  Will watch your condition.  Will get help right away if you are not doing well or get worse. Document Released: 05/31/2000 Document Revised: 08/26/2011 Document Reviewed: 12/07/2008 [facility] Patient Information 2013 [place], [place].

## 2012-10-02 DIAGNOSIS — S99929A Unspecified injury of unspecified foot, initial encounter: Secondary | ICD-10-CM | POA: Insufficient documentation

## 2012-10-02 NOTE — Assessment & Plan Note (Signed)
Broken toenail is easily removed with clippers per patient request. Trimmed and debrided distally, appears somewhat dysmorphic possibly fungal. Tolerated well, no pain, wounds or bleeding noted. F/u prn or if re-injures. She is low risk for wounds given new diagnosis of well controlled DM2.

## 2012-10-02 NOTE — Progress Notes (Signed)
  Subjective:    Patient ID: Wanda Baker, female    DOB: 07-30-1960, 52 y.o.   MRN: [id number]  Diabetes    1. DM2. Taking once daily metformin. Feels well. Working hard on lifestyle and diet changes. Lab Results  Component Value Date   HGBA1C 5.9 10/01/2012   2. F/u sinusitis. Symptoms resolved finally after completing levaquin course. No fever, chills, sinus pain, cough, dyspnea, rhinorrhea.  3. Toenail problem. She stubbed her toe on a door 2-3 weeks ago. Has a hanging toenail she wants removed on right great toe. States she is too nervous to cut it off herself, doesn't want to injure herself. Has full sensation in her feet. No pain, bleeding, swelling, rash or open skin.   Review of Systems See HPI otherwise negative.  reports that she quit smoking about 3 years ago. She has never used smokeless tobacco.     Objective:   Physical Exam  Vitals reviewed. Constitutional: She is oriented to person, place, and time. She appears well-developed and well-nourished. No distress.  HENT:  Head: Normocephalic and atraumatic.  Musculoskeletal: She exhibits no edema and no tenderness.  Neurological: She is alert and oriented to person, place, and time.  Skin: She is not diaphoretic.  Right great toenail is broken across the mid nail plate. Is flaky and slight dystrophic. Attached only by a small portion of medial nail fold. No pain, erythema, open skin, wound.  Monofilament sensation intact.   Psychiatric: She has a normal mood and affect.       Assessment & Plan:

## 2012-10-02 NOTE — Assessment & Plan Note (Signed)
Well controlled with lifestyle, low dose metformin. No changes today. Will need yearly eye exam. Discussed surveillance of feet, though this will be more important in long term. Continue statin, acei. F/u in 4 months.

## 2012-10-21 ENCOUNTER — Telehealth: Payer: Self-pay | Admitting: Family Medicine

## 2012-10-21 ENCOUNTER — Other Ambulatory Visit: Payer: Self-pay | Admitting: Family Medicine

## 2012-10-21 MED ORDER — CETIRIZINE HCL 5 MG PO TABS: 5.0000 mg | ORAL_TABLET | Freq: Every day | ORAL | Status: DC

## 2012-10-21 NOTE — Telephone Encounter (Signed)
Patient is calling needing something for her allergies.  She uses [facility] [address].

## 2012-10-21 NOTE — Telephone Encounter (Signed)
Refilled zyrtec. Notify pt please.

## 2012-10-22 NOTE — Telephone Encounter (Signed)
Attempted to call patient no answer

## 2012-10-22 NOTE — Telephone Encounter (Signed)
Patient states she already taking zyrtec,she ment to ask what else you could prescribe to take with her zyrtec.please advise. Darrnell Mangiaracina, Virgel Bouquet

## 2012-10-22 NOTE — Telephone Encounter (Signed)
She can try allegra or claritin instead if those might work better for her. Stop taking zyrtec.

## 2012-10-23 ENCOUNTER — Other Ambulatory Visit: Payer: Self-pay | Admitting: Family Medicine

## 2012-10-23 MED ORDER — LEVOCETIRIZINE DIHYDROCHLORIDE 5 MG PO TABS: 5.0000 mg | ORAL_TABLET | Freq: Every evening | ORAL | Status: DC

## 2012-10-23 NOTE — Telephone Encounter (Signed)
Patient states she has tried those and they don't work. Please advise. Camyra Vaeth, Virgel Bouquet

## 2012-10-23 NOTE — Telephone Encounter (Signed)
Sent xyzal rx. Please notify pt

## 2012-10-23 NOTE — Telephone Encounter (Signed)
Related message,pt voiced understanding. Wanda Baker  

## 2012-10-28 ENCOUNTER — Telehealth: Payer: Self-pay | Admitting: Family Medicine

## 2012-10-28 NOTE — Telephone Encounter (Signed)
Pt states that her allergy medicine is not working and she feels bad. Sore throat and sinus headache - wants to know if there is something different that she can take [facility] [address]

## 2012-10-29 ENCOUNTER — Encounter: Payer: Self-pay | Admitting: Family Medicine

## 2012-10-29 ENCOUNTER — Ambulatory Visit (INDEPENDENT_AMBULATORY_CARE_PROVIDER_SITE_OTHER): Payer: BC Managed Care – PPO | Admitting: Family Medicine

## 2012-10-29 VITALS — BP 114/76 | HR 86 | Temp 98.3°F | Ht 67.0 in | Wt 159.8 lb

## 2012-10-29 DIAGNOSIS — J309 Allergic rhinitis, unspecified: Secondary | ICD-10-CM

## 2012-10-29 MED ORDER — MONTELUKAST SODIUM 10 MG PO TABS: 10.0000 mg | ORAL_TABLET | Freq: Every day | ORAL | Status: DC

## 2012-10-29 MED ORDER — TRIAMCINOLONE ACETONIDE(NASAL) 55 MCG/ACT NA INHA: 2.0000 | Freq: Every day | NASAL | Status: DC

## 2012-10-29 MED ORDER — AZITHROMYCIN 250 MG PO TABS: ORAL_TABLET | ORAL | Status: DC

## 2012-10-29 NOTE — Progress Notes (Signed)
  Subjective:    Patient ID: Wanda Baker, female    DOB: [birth year], 52 y.o.   MRN: [id number]  URI  This is a recurrent problem. The current episode started more than 1 month ago. The problem has been waxing and waning. There has been no fever. Associated symptoms include congestion, coughing, sinus pain and a sore throat. She has tried antihistamine (nasal steroids) for the symptoms. The treatment provided mild relief.  Previous Z-max and levaquin treatment.    Review of Systems  Constitutional: Negative for fever and chills.  HENT: Positive for congestion and sore throat.   Respiratory: Positive for cough.        Objective:   Physical Exam  Vitals reviewed. Constitutional: She appears well-developed and well-nourished.  HENT:  Head: Normocephalic and atraumatic.  Right Ear: Tympanic membrane normal.  Left Ear: Tympanic membrane normal.  Nose: Mucosal edema present. Right sinus exhibits maxillary sinus tenderness.  Mouth/Throat: Uvula is midline. Mucous membranes are not pale. Edematous present.  Eyes: No scleral icterus.  Neck: Neck supple.  Cardiovascular: Normal rate and regular rhythm.   Pulmonary/Chest: Effort normal and breath sounds normal.  Abdominal: Soft. Bowel sounds are normal. There is no tenderness.  Neurological: She is alert.  Skin: Skin is warm and dry. No rash noted.          Assessment & Plan:

## 2012-10-29 NOTE — Patient Instructions (Signed)
Allergic Rhinitis Allergic rhinitis is when the mucous membranes in the nose respond to allergens. Allergens are particles in the air that cause your body to have an allergic reaction. This causes you to release allergic antibodies. Through a chain of events, these eventually cause you to release histamine into the blood stream (hence the use of antihistamines). Although meant to be protective to the body, it is this release that causes your discomfort, such as frequent sneezing, congestion and an itchy runny nose.  CAUSES  The pollen allergens may come from grasses, trees, and weeds. This is seasonal allergic rhinitis, or "hay fever." Other allergens cause year-round allergic rhinitis (perennial allergic rhinitis) such as house dust mite allergen, pet dander and mold spores.  SYMPTOMS   Nasal stuffiness (congestion).  Runny, itchy nose with sneezing and tearing of the eyes.  There is often an itching of the mouth, eyes and ears. It cannot be cured, but it can be controlled with medications. DIAGNOSIS  If you are unable to determine the offending allergen, skin or blood testing may find it. TREATMENT   Avoid the allergen.  Medications and allergy shots (immunotherapy) can help.  Hay fever may often be treated with antihistamines in pill or nasal spray forms. Antihistamines block the effects of histamine. There are over-the-counter medicines that may help with nasal congestion and swelling around the eyes. Check with your caregiver before taking or giving this medicine. If the treatment above does not work, there are many new medications your caregiver can prescribe. Stronger medications may be used if initial measures are ineffective. Desensitizing injections can be used if medications and avoidance fails. Desensitization is when a patient is given ongoing shots until the body becomes less sensitive to the allergen. Make sure you follow up with your caregiver if problems continue. SEEK MEDICAL  CARE IF:   You develop fever (more than 100.5 F (38.1 C).  You develop a cough that does not stop easily (persistent).  You have shortness of breath.  You start wheezing.  Symptoms interfere with normal daily activities. Document Released: 02/26/2001 Document Revised: 08/26/2011 Document Reviewed: 09/07/2008 ExitCare Patient Information 2013 [employer]. Sinusitis Sinusitis is redness, soreness, and swelling (inflammation) of the paranasal sinuses. Paranasal sinuses are air pockets within the bones of your face (beneath the eyes, the middle of the forehead, or above the eyes). In healthy paranasal sinuses, mucus is able to drain out, and air is able to circulate through them by way of your nose. However, when your paranasal sinuses are inflamed, mucus and air can become trapped. This can allow bacteria and other germs to grow and cause infection. Sinusitis can develop quickly and last only a short time (acute) or continue over a long period (chronic). Sinusitis that lasts for more than 12 weeks is considered chronic.  CAUSES  Causes of sinusitis include:  Allergies.  Structural abnormalities, such as displacement of the cartilage that separates your nostrils (deviated septum), which can decrease the air flow through your nose and sinuses and affect sinus drainage.  Functional abnormalities, such as when the small hairs (cilia) that line your sinuses and help remove mucus do not work properly or are not present. SYMPTOMS  Symptoms of acute and chronic sinusitis are the same. The primary symptoms are pain and pressure around the affected sinuses. Other symptoms include:  Upper toothache.  Earache.  Headache.  Bad breath.  Decreased sense of smell and taste.  A cough, which worsens when you are lying flat.  Fatigue.    Fever.  Thick drainage from your nose, which often is green and may contain pus (purulent).  Swelling and warmth over the affected sinuses. DIAGNOSIS    Your caregiver will perform a physical exam. During the exam, your caregiver may:  Look in your nose for signs of abnormal growths in your nostrils (nasal polyps).  Tap over the affected sinus to check for signs of infection.  View the inside of your sinuses (endoscopy) with a special imaging device with a light attached (endoscope), which is inserted into your sinuses. If your caregiver suspects that you have chronic sinusitis, one or more of the following tests may be recommended:  Allergy tests.  Nasal culture A sample of mucus is taken from your nose and sent to a lab and screened for bacteria.  Nasal cytology A sample of mucus is taken from your nose and examined by your caregiver to determine if your sinusitis is related to an allergy. TREATMENT  Most cases of acute sinusitis are related to a viral infection and will resolve on their own within 10 days. Sometimes medicines are prescribed to help relieve symptoms (pain medicine, decongestants, nasal steroid sprays, or saline sprays).  However, for sinusitis related to a bacterial infection, your caregiver will prescribe antibiotic medicines. These are medicines that will help kill the bacteria causing the infection.  Rarely, sinusitis is caused by a fungal infection. In theses cases, your caregiver will prescribe antifungal medicine. For some cases of chronic sinusitis, surgery is needed. Generally, these are cases in which sinusitis recurs more than 3 times per year, despite other treatments. HOME CARE INSTRUCTIONS   Drink plenty of water. Water helps thin the mucus so your sinuses can drain more easily.  Use a humidifier.  Inhale steam 3 to 4 times a day (for example, sit in the bathroom with the shower running).  Apply a warm, moist washcloth to your face 3 to 4 times a day, or as directed by your caregiver.  Use saline nasal sprays to help moisten and clean your sinuses.  Take over-the-counter or prescription medicines for  pain, discomfort, or fever only as directed by your caregiver. SEEK IMMEDIATE MEDICAL CARE IF:  You have increasing pain or severe headaches.  You have nausea, vomiting, or drowsiness.  You have swelling around your face.  You have vision problems.  You have a stiff neck.  You have difficulty breathing. MAKE SURE YOU:   Understand these instructions.  Will watch your condition.  Will get help right away if you are not doing well or get worse. Document Released: 06/03/2005 Document Revised: 08/26/2011 Document Reviewed: 06/18/2011 ExitCare Patient Information 2013 [employer].  

## 2012-10-29 NOTE — Assessment & Plan Note (Signed)
?   Super-imposed sinusitis with tenderness, sinus headache.  S/p abx treatment 1 month ago.  Unresponsive to change in anti-histamine and nasacort.  Add singulair, z-pack.

## 2012-10-29 NOTE — Telephone Encounter (Signed)
Pt called again - she has not heard anything and is needing to know what to do.  pls advise

## 2012-10-29 NOTE — Telephone Encounter (Signed)
Pt states that she is miserable - " I am having sinus headaches and coughing - just plain miserable" and that her new allergy med is not working. Would like appointment as soon as possible - given one for today at 130. Wyatt Haste, RN-BSN

## 2012-11-13 ENCOUNTER — Ambulatory Visit (INDEPENDENT_AMBULATORY_CARE_PROVIDER_SITE_OTHER): Payer: BC Managed Care – PPO | Admitting: Family Medicine

## 2012-11-13 ENCOUNTER — Encounter: Payer: Self-pay | Admitting: Family Medicine

## 2012-11-13 VITALS — BP 128/66 | HR 90 | Temp 97.7°F | Ht 67.0 in | Wt 160.9 lb

## 2012-11-13 DIAGNOSIS — J329 Chronic sinusitis, unspecified: Secondary | ICD-10-CM

## 2012-11-13 DIAGNOSIS — I10 Essential (primary) hypertension: Secondary | ICD-10-CM

## 2012-11-13 DIAGNOSIS — E781 Pure hyperglyceridemia: Secondary | ICD-10-CM

## 2012-11-13 DIAGNOSIS — R51 Headache: Secondary | ICD-10-CM

## 2012-11-13 MED ORDER — HYDROCHLOROTHIAZIDE 25 MG PO TABS: ORAL_TABLET | ORAL | Status: DC

## 2012-11-13 MED ORDER — KETOROLAC TROMETHAMINE 30 MG/ML IJ SOLN
30.0000 mg | Freq: Once | INTRAMUSCULAR | Status: AC
  Administered 2012-11-13: 30 mg via INTRAMUSCULAR

## 2012-11-13 MED ORDER — ATORVASTATIN CALCIUM 20 MG PO TABS: 20.0000 mg | ORAL_TABLET | Freq: Every day | ORAL | Status: DC

## 2012-11-13 NOTE — Patient Instructions (Addendum)
You may have allergic triggered or just persistent bacterial infection. Since you havent responded to our therapy, will refer to ENT. Keep taking all the same medications. Drink plenty of water. Take nasal saline three times daily.

## 2012-11-13 NOTE — Progress Notes (Signed)
  Subjective:    Patient ID: Wanda Baker, female    DOB: 19-Jul-1960, 53 y.o.   MRN: [id number]  HPI  Sinus problems. 52 year old diabetic patient follows up for recurrent sinus problems. Since early March this year, she has been treated for sinusitis with 2 courses of azithromycin, Levaquin, and prolonged nasal steroids and antihistamines. Also started on a Singulair 2 weeks ago. Symptoms seemed to improve initially with antibiotic, then worsen again. Recently her symptoms have been worsening for the past 3 days after she smelled a strong perfume at work. She just completed azithromycin pack one week ago. She notes a left-sided sinus pressure and posterior headache. Cough productive of clear sputum and coughing spells. She has clear rhinorrhea and facial pressure.  Denies fever, chills, dyspnea, wheezing, chest pain, rash.   Review of Systems See HPI otherwise negative.  reports that she quit smoking about 3 years ago. She has never used smokeless tobacco.     Objective:   Physical Exam  Vitals reviewed. Constitutional: She appears well-developed and well-nourished. No distress.  Patient coughs loudly upon my entering the room.  HENT:  Head: Normocephalic and atraumatic.  Hoarseness. Right eardrum appears dull red. Landmarks intact, no bulging. Significant nasal turbinate edema. White rhinorrhea. Left frontal sinus tenderness to palpation.  Eyes: EOM are normal. Pupils are equal, round, and reactive to light. Right eye exhibits no discharge. Left eye exhibits no discharge.  Slight conjunctival injection bilaterally  Neck: Normal range of motion. Neck supple. No thyromegaly present.  Cardiovascular: Normal rate, regular rhythm and normal heart sounds.   No murmur heard. Pulmonary/Chest: Effort normal and breath sounds normal. No respiratory distress. She has no wheezes. She has no rales.  Lymphadenopathy:    She has no cervical adenopathy.  Skin: No rash noted. She is not  diaphoretic.  Psychiatric: She has a normal mood and affect.          Assessment & Plan:

## 2012-11-13 NOTE — Assessment & Plan Note (Signed)
Recurrence of this issue now going on 3 months. Has not responded to 3 courses of antibiotics including Levaquin and azithromycin x2. Also on maximal allergic rhinitis treatment. Given her failure to improve, will refer to ENT for further evaluation. She will continue her Singulair, antihistamine, nasal steroid at this time. She requests Toradol injection for her headache today.

## 2012-12-07 ENCOUNTER — Telehealth: Payer: Self-pay | Admitting: *Deleted

## 2012-12-07 NOTE — Telephone Encounter (Signed)
Message copied by Radene Ou on Mon Dec 07, 2012  2:57 PM ------      Message from: Durwin Reges      Created: Mon Dec 07, 2012  2:27 PM      Regarding: office visit record       Can you request records from [facility] ENT visit so I can close referral? ------

## 2012-12-07 NOTE — Telephone Encounter (Signed)
[facility] ENT and LM with Medical Records for them to please fax referral notes to [place] per MD request.  Radene Ou, CMA

## 2012-12-21 ENCOUNTER — Other Ambulatory Visit: Payer: Self-pay | Admitting: Family Medicine

## 2012-12-24 ENCOUNTER — Other Ambulatory Visit: Payer: Self-pay

## 2013-01-20 ENCOUNTER — Ambulatory Visit: Payer: BC Managed Care – PPO | Admitting: Family Medicine

## 2013-01-27 ENCOUNTER — Encounter: Payer: Self-pay | Admitting: Family Medicine

## 2013-01-27 ENCOUNTER — Ambulatory Visit (INDEPENDENT_AMBULATORY_CARE_PROVIDER_SITE_OTHER): Payer: BC Managed Care – PPO | Admitting: Family Medicine

## 2013-01-27 VITALS — BP 125/86 | HR 88 | Ht 66.0 in | Wt 155.0 lb

## 2013-01-27 DIAGNOSIS — A084 Viral intestinal infection, unspecified: Secondary | ICD-10-CM

## 2013-01-27 DIAGNOSIS — A088 Other specified intestinal infections: Secondary | ICD-10-CM

## 2013-01-27 MED ORDER — ONDANSETRON HCL 4 MG PO TABS: 4.0000 mg | ORAL_TABLET | Freq: Three times a day (TID) | ORAL | Status: DC | PRN

## 2013-01-27 NOTE — Progress Notes (Signed)
Patient ID: Wanda Baker, female   DOB: 1961/05/04, 52 y.o.   MRN: [id number]   HPI:  GI upset: Started on Monday, patient had an upset stomach with some diarrhea. Yesterday continued to have diarrhea and vomited once, stayed home from work yesterday. This morning continued to feel nauseated and has had diarrhea one time. She stayed home from work again today and needs a note for a work excuse. Did not take temp at home but felt warm. She works in Personnel officer at [employer] but has not noted any sick contacts. Is able to drink plenty of fluids.   ROS: See HPI  PMFSH: hx HTN  PHYSICAL EXAM: BP 125/86  Pulse 88  Ht 5\' 6"  (1.676 m)  Wt 155 lb (70.308 kg)  BMI 25.03 kg/m2 Gen: NAD HEENT: no abnormal lymph nodes palpable, mouth is moist Heart: RRR Lungs: CTAB Abd: + BS, nontender, nondistended, soft Neuro: grossly nonfocal, speech intact  ASSESSMENT/PLAN:  # Vomiting/Diarrhea: - likely viral gastroenteritis, appears clinically well today - will rx small # of zofran for prn use with nausea - given work excuse note - maintain hydration with plenty of fluids - f/u if not improved in several days - instructed pt to schedule appt with new PCP to meet him in the next month or so, already has appt scheduled

## 2013-01-27 NOTE — Patient Instructions (Addendum)
It was nice to meet you today!  I think you have a GI bug. I sent in a prescription for some nausea medicine which you can take every 8 hours as needed.  Drink plenty of fluids to stay hydrated. Come back if you have any worsening symptoms or aren't better in a few days.  Schedule an appointment to come back and meet your new primary doctor, Dr. Paulina Fusi within the next month or so.  Be well, Dr. Pollie Meyer    Viral Gastroenteritis Viral gastroenteritis is also known as stomach flu. This condition affects the stomach and intestinal tract. It can cause sudden diarrhea and vomiting. The illness typically lasts 3 to 8 days. Most people develop an immune response that eventually gets rid of the virus. While this natural response develops, the virus can make you quite ill. CAUSES  Many different viruses can cause gastroenteritis, such as rotavirus or noroviruses. You can catch one of these viruses by consuming contaminated food or water. You may also catch a virus by sharing utensils or other personal items with an infected person or by touching a contaminated surface. SYMPTOMS  The most common symptoms are diarrhea and vomiting. These problems can cause a severe loss of body fluids (dehydration) and a body salt (electrolyte) imbalance. Other symptoms may include:  Fever.  Headache.  Fatigue.  Abdominal pain. DIAGNOSIS  Your caregiver can usually diagnose viral gastroenteritis based on your symptoms and a physical exam. A stool sample may also be taken to test for the presence of viruses or other infections. TREATMENT  This illness typically goes away on its own. Treatments are aimed at rehydration. The most serious cases of viral gastroenteritis involve vomiting so severely that you are not able to keep fluids down. In these cases, fluids must be given through an intravenous line (IV). HOME CARE INSTRUCTIONS   Drink enough fluids to keep your urine clear or pale yellow. Drink small amounts of  fluids frequently and increase the amounts as tolerated.  Ask your caregiver for specific rehydration instructions.  Avoid:  Foods high in sugar.  Alcohol.  Carbonated drinks.  Tobacco.  Juice.  Caffeine drinks.  Extremely hot or cold fluids.  Fatty, greasy foods.  Too much intake of anything at one time.  Dairy products until 24 to 48 hours after diarrhea stops.  You may consume probiotics. Probiotics are active cultures of beneficial bacteria. They may lessen the amount and number of diarrheal stools in adults. Probiotics can be found in yogurt with active cultures and in supplements.  Wash your hands well to avoid spreading the virus.  Only take over-the-counter or prescription medicines for pain, discomfort, or fever as directed by your caregiver. Do not give aspirin to children. Antidiarrheal medicines are not recommended.  Ask your caregiver if you should continue to take your regular prescribed and over-the-counter medicines.  Keep all follow-up appointments as directed by your caregiver. SEEK IMMEDIATE MEDICAL CARE IF:   You are unable to keep fluids down.  You do not urinate at least once every 6 to 8 hours.  You develop shortness of breath.  You notice blood in your stool or vomit. This may look like coffee grounds.  You have abdominal pain that increases or is concentrated in one small area (localized).  You have persistent vomiting or diarrhea.  You have a fever.  The patient is a child younger than 3 months, and he or she has a fever.  The patient is a child older than 3  months, and he or she has a fever and persistent symptoms.  The patient is a child older than 3 months, and he or she has a fever and symptoms suddenly get worse.  The patient is a baby, and he or she has no tears when crying. MAKE SURE YOU:   Understand these instructions.  Will watch your condition.  Will get help right away if you are not doing well or get  worse. Document Released: 06/03/2005 Document Revised: 08/26/2011 Document Reviewed: 03/20/2011 [facility] Patient Information 2014 [place], [place].

## 2013-02-19 ENCOUNTER — Encounter: Payer: Self-pay | Admitting: Family Medicine

## 2013-02-19 ENCOUNTER — Ambulatory Visit (INDEPENDENT_AMBULATORY_CARE_PROVIDER_SITE_OTHER): Payer: BC Managed Care – PPO | Admitting: Family Medicine

## 2013-02-19 VITALS — BP 128/72 | HR 88 | Temp 98.3°F | Ht 66.0 in | Wt 154.7 lb

## 2013-02-19 DIAGNOSIS — M4722 Other spondylosis with radiculopathy, cervical region: Secondary | ICD-10-CM | POA: Insufficient documentation

## 2013-02-19 DIAGNOSIS — Z23 Encounter for immunization: Secondary | ICD-10-CM

## 2013-02-19 DIAGNOSIS — E785 Hyperlipidemia, unspecified: Secondary | ICD-10-CM

## 2013-02-19 DIAGNOSIS — E119 Type 2 diabetes mellitus without complications: Secondary | ICD-10-CM

## 2013-02-19 DIAGNOSIS — M47812 Spondylosis without myelopathy or radiculopathy, cervical region: Secondary | ICD-10-CM

## 2013-02-19 LAB — POCT GLYCOSYLATED HEMOGLOBIN (HGB A1C): Hemoglobin A1C: 5.9

## 2013-02-19 NOTE — Progress Notes (Signed)
Wanda Baker is a 52 y.o. female who presents today for HTN, Neck Pain, Hyperlipidemia, and DM II, controlled.  DM II - Pt last A1C 5.9 and compliant with Metformin w/o N/V/D.  Massive changes in diet and no sugar, white bread, fried foods for past yr.   HTN - Stable, compliant with meds w/o lightheadedness, dizziness, palpitations, edema.  Neck Pain - Ongoing for last several months now, R handed female who has been taking tylenol 625 mg BID PRN for the pain.  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  Does not wake her up at night, denies any radiculopathy down her arms, weakness gripping objects or putting her arm over her head.  No weight loss, fevers, chills.   Hyperlipidemia - Controlled, on Lipitor w/o myalgias.    Past Medical History  Diagnosis Date  . Allergy     allergic rhinitis  . Arthritis   . GERD (gastroesophageal reflux disease)   . Hypertension     History  Smoking status  . Former Smoker  . Quit date: 08/27/2009  Smokeless tobacco  . Never Used    Family History  Problem Relation Age of Onset  . Heart disease Maternal Grandmother   . Diabetes Maternal Grandmother   . Hypertension Maternal Grandmother   . Heart disease Maternal Grandfather   . Heart disease Paternal Grandmother   . Diabetes Paternal Grandmother   . Alzheimer's disease Paternal Grandmother     Current Outpatient Prescriptions on File Prior to Visit  Medication Sig Dispense Refill  . atorvastatin (LIPITOR) 20 MG tablet Take 1 tablet (20 mg total) by mouth daily.  90 tablet  3  . gabapentin (NEURONTIN) 600 MG tablet Take 600 mg by mouth 3 (three) times daily.      . Guaifenesin-Codeine 200-20 MG/5ML LIQD Take 5 mLs by mouth every 6 (six) hours as needed.  200 mL  0  . hydrochlorothiazide (HYDRODIURIL) 25 MG tablet TAKE ONE TABLET BY MOUTH ONE TIME DAILY  90  tablet  3  . levocetirizine (XYZAL) 5 MG tablet Take 1 tablet (5 mg total) by mouth every evening.  30 tablet  4  . lisinopril (PRINIVIL,ZESTRIL) 5 MG tablet TAKE TWO TABLETS BY MOUTH DAILY  180 tablet  3  . metFORMIN (GLUCOPHAGE) 500 MG tablet TAKE ONE TABLET BY MOUTH TWICE DAILY WITH A MEAL  60 tablet  5  . montelukast (SINGULAIR) 10 MG tablet Take 1 tablet (10 mg total) by mouth at bedtime.  30 tablet  3  . Omega-3 Fatty Acids (FISH OIL CONCENTRATE) 1000 MG CAPS Take by mouth.        [name] Kitchen omeprazole (PRILOSEC) 20 MG capsule Take 1 capsule (20 mg total) by mouth daily.  30 capsule  5  . ondansetron (ZOFRAN) 4 MG tablet Take 1 tablet (4 mg total) by mouth every 8 (eight) hours as needed for nausea.  10 tablet  0  . triamcinolone (NASACORT AQ) 55 MCG/ACT nasal inhaler Place 2 sprays into the nose daily.  1 Inhaler  12   No current facility-administered medications on file prior to visit.    ROS: Per HPI.  All other systems reviewed and are negative.   Physical Exam Filed Vitals:   02/19/13 1047  BP: 128/72  Pulse: 88  Temp: 98.3 F (36.8 C)    Physical Examination: Neck - No  gross edema, erythema, skin markings.  No TTP along cervical midline or paraspinal muscles.  TTP on L trap.  ROM decreased with forward neck flexion.  Spurling's Test + on Left.  Shoulder exam benign with normal ROM, MS, RC testing.  Chest - clear to auscultation, no wheezes, rales or rhonchi, symmetric air entry

## 2013-02-19 NOTE — Patient Instructions (Signed)
Osteoarthritis Osteoarthritis is the most common form of arthritis. It is redness, soreness, and swelling (inflammation) affecting the cartilage. Cartilage acts as a cushion, covering the ends of bones where they meet to form a joint. CAUSES  Over time, the cartilage begins to wear away. This causes bone to rub on bone. This produces pain and stiffness in the affected joints. Factors that contribute to this problem are:  Excessive body weight.  Age.  Overuse of joints. SYMPTOMS   People with osteoarthritis usually experience joint pain, swelling, or stiffness.  Over time, the joint may lose its normal shape.  Small deposits of bone (osteophytes) may grow on the edges of the joint.  Bits of bone or cartilage can break off and float inside the joint space. This may cause more pain and damage.  Osteoarthritis can lead to depression, anxiety, feelings of helplessness, and limitations on daily activities. The most commonly affected joints are in the:  Ends of the fingers.  Thumbs.  Neck.  Lower back.  Knees.  Hips. DIAGNOSIS  Diagnosis is mostly based on your symptoms and exam. Tests may be helpful, including:  X-rays of the affected joint.  A computerized magnetic scan (MRI).  Blood tests to rule out other types of arthritis.  Joint fluid tests. This involves using a needle to draw fluid from the joint and examining the fluid under a microscope. TREATMENT  Goals of treatment are to control pain, improve joint function, maintain a normal body weight, and maintain a healthy lifestyle. Treatment approaches may include:  A prescribed exercise program with rest and joint relief.  Weight control with nutritional education.  Pain relief techniques such as:  Properly applied heat and cold.  Electric pulses delivered to nerve endings under the skin (transcutaneous electrical nerve stimulation, TENS).  Massage.  Certain supplements. Ask your caregiver before using any  supplements, especially in combination with prescribed drugs.  Medicines to control pain, such as:  Acetaminophen.  Nonsteroidal anti-inflammatory drugs (NSAIDs), such as naproxen.  Narcotic or central-acting agents, such as tramadol. This drug carries a risk of addiction and is generally prescribed for short-term use.  Corticosteroids. These can be given orally or as injection. This is a short-term treatment, not recommended for routine use.  Surgery to reposition the bones and relieve pain (osteotomy) or to remove loose pieces of bone and cartilage. Joint replacement may be needed in advanced states of osteoarthritis. HOME CARE INSTRUCTIONS  Your caregiver can recommend specific types of exercise. These may include:  Strengthening exercises. These are done to strengthen the muscles that support joints affected by arthritis. They can be performed with weights or with exercise bands to add resistance.  Aerobic activities. These are exercises, such as brisk walking or low-impact aerobics, that get your heart pumping. They can help keep your lungs and circulatory system in shape.  Range-of-motion activities. These keep your joints limber.  Balance and agility exercises. These help you maintain daily living skills. Learning about your condition and being actively involved in your care will help improve the course of your osteoarthritis. SEEK MEDICAL CARE IF:   You feel hot or your skin turns red.  You develop a rash in addition to your joint pain.  You have an oral temperature above 102 F (38.9 C). FOR MORE INFORMATION  National Institute of Arthritis and Musculoskeletal and Skin Diseases: www.niams.nih.gov National Institute on Aging: www.nia.nih.gov American College of Rheumatology: www.rheumatology.org Document Released: 06/03/2005 Document Revised: 08/26/2011 Document Reviewed: 09/14/2009 [employer] Patient Information 2014 [employer].  

## 2013-02-19 NOTE — Assessment & Plan Note (Addendum)
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  Recommended increasing Tylenol dosing to 625 mg TID-QID daily since she is not a candidate for Mobic due to GERD.  Will f/u in three months and discuss possible PT vs referral to neurosurgery for discussion of surg vs Epidural injections vs MRI.

## 2013-02-19 NOTE — Assessment & Plan Note (Signed)
Well controlled on diet and exercise, and metformin 500 mg qd.  If A1C < 6.0 at this visit, discussed with pt about stopping medication and rechecking in three months.   Will consider pending A1C

## 2013-02-19 NOTE — Assessment & Plan Note (Signed)
Stable, on Lipitor 20 and according to new Lipid guidelines would qualify for 40 mg.  Will discuss at next visit and get Direct LDL.

## 2013-04-19 ENCOUNTER — Other Ambulatory Visit: Payer: Self-pay | Admitting: Family Medicine

## 2013-04-30 ENCOUNTER — Telehealth: Payer: Self-pay | Admitting: Family Medicine

## 2013-04-30 DIAGNOSIS — J309 Allergic rhinitis, unspecified: Secondary | ICD-10-CM

## 2013-04-30 NOTE — Telephone Encounter (Signed)
Pt want to speak with you to ask why rx request for Singular was denied.  Have seen provider in Sept and have a f/u appt on Dec 11th.  Please call her back at [phone].  Can leave msg with mother if not available

## 2013-05-03 MED ORDER — MONTELUKAST SODIUM 10 MG PO TABS: 10.0000 mg | ORAL_TABLET | Freq: Every day | ORAL | Status: DC

## 2013-05-03 NOTE — Telephone Encounter (Signed)
Spoke with mother as daughter at work today.  Unsure what she means by "denied" and mother did not have any insight into this either.  Will go ahead and refill/send to pharmacy and if they require a reauthorization, which I'm guessing she means by "denied", will fill this out.  Mother will let daughter know of discussion and plan.  Thanks, Twana First. Paulina Fusi, DO of [facility] 05/03/2013, 9:56 AM

## 2013-05-27 ENCOUNTER — Ambulatory Visit: Payer: BC Managed Care – PPO | Admitting: Family Medicine

## 2013-05-28 ENCOUNTER — Other Ambulatory Visit: Payer: Self-pay | Admitting: Family Medicine

## 2013-06-02 ENCOUNTER — Encounter: Payer: Self-pay | Admitting: Family Medicine

## 2013-06-02 ENCOUNTER — Ambulatory Visit (INDEPENDENT_AMBULATORY_CARE_PROVIDER_SITE_OTHER): Payer: BC Managed Care – PPO | Admitting: Family Medicine

## 2013-06-02 VITALS — BP 127/74 | HR 88 | Temp 98.2°F | Resp 18 | Wt 155.0 lb

## 2013-06-02 DIAGNOSIS — E119 Type 2 diabetes mellitus without complications: Secondary | ICD-10-CM

## 2013-06-02 DIAGNOSIS — I10 Essential (primary) hypertension: Secondary | ICD-10-CM

## 2013-06-02 DIAGNOSIS — J069 Acute upper respiratory infection, unspecified: Secondary | ICD-10-CM

## 2013-06-02 LAB — POCT GLYCOSYLATED HEMOGLOBIN (HGB A1C): Hemoglobin A1C: 5.8

## 2013-06-02 NOTE — Assessment & Plan Note (Signed)
Pt having one day of Sx including sore throat, dysphagia, non productive cough, sinus pressure and tenderness.  She denies any fever, chills, sweats, productive cough, recent travel, weight loss, SOB/DOE, CP, tinnitus, or otalgia.  Centor Score 1, no exudates, highly doubt strept.  Recommend Sx tx with Tylenol as she does not do well with ibuprofen, Nasacort, nasal saline, Mucinex ER 600 mg BID, and delsym.  If no improvement by early next week, f/u at that time.

## 2013-06-02 NOTE — Assessment & Plan Note (Signed)
Well controlled on diet and exercise with metformin 500 mg qd.  Discussed continuing medication at this time she would like to do this.  Recheck in three months, she will get her ophtho exam at her eye doctor in the next month.

## 2013-06-02 NOTE — Assessment & Plan Note (Signed)
Well controlled today, continue HCTZ, lisinopril.  Consider BMET at next visit in three months.

## 2013-06-02 NOTE — Progress Notes (Signed)
Wanda Baker is a 52 y.o. female who presents today for HTN, DM, and acute URI  HTN - Well controlled, compliant with Lisinopril and HCTZ, denies any chronic cough, palpitations, edema, HA, blurred vision, diplopia.  DM II - Well controlled on diet and metformin 500 mg qd, denies N/V/D.   URI - Pt having one day of Sx including sore throat, dysphagia, non productive cough, sinus pressure and tenderness.  She denies any fever, chills, sweats, productive cough, recent travel, weight loss, SOB/DOE, CP, tinnitus, or otalgia.  Past Medical History  Diagnosis Date  . Allergy     allergic rhinitis  . Arthritis   . GERD (gastroesophageal reflux disease)   . Hypertension     History  Smoking status  . Former Smoker  . Quit date: 08/27/2009  Smokeless tobacco  . Never Used    Family History  Problem Relation Age of Onset  . Heart disease Maternal Grandmother   . Diabetes Maternal Grandmother   . Hypertension Maternal Grandmother   . Heart disease Maternal Grandfather   . Heart disease Paternal Grandmother   . Diabetes Paternal Grandmother   . Alzheimer's disease Paternal Grandmother     Current Outpatient Prescriptions on File Prior to Visit  Medication Sig Dispense Refill  . gabapentin (NEURONTIN) 600 MG tablet Take 600 mg by mouth 3 (three) times daily.      . hydrochlorothiazide (HYDRODIURIL) 25 MG tablet TAKE ONE TABLET BY MOUTH ONE TIME DAILY  90 tablet  3  . levocetirizine (XYZAL) 5 MG tablet Take 1 tablet (5 mg total) by mouth every evening.  30 tablet  4  . lisinopril (PRINIVIL,ZESTRIL) 5 MG tablet TAKE TWO TABLETS BY MOUTH DAILY  180 tablet  3  . metFORMIN (GLUCOPHAGE) 500 MG tablet TAKE 1 TABLET BY MOUTH TWICE DAILY WITH MEALS  60 tablet  2  . montelukast (SINGULAIR) 10 MG tablet Take 1 tablet (10 mg total) by mouth at bedtime.  30 tablet  5  . Omega-3 Fatty Acids (FISH OIL CONCENTRATE) 1000 MG CAPS Take by mouth.        [name] Kitchen omeprazole (PRILOSEC) 20 MG capsule TAKE ONE  CAPSULE BY MOUTH ONE TIME DAILY  30 capsule  2  . ondansetron (ZOFRAN) 4 MG tablet Take 1 tablet (4 mg total) by mouth every 8 (eight) hours as needed for nausea.  10 tablet  0  . triamcinolone (NASACORT AQ) 55 MCG/ACT nasal inhaler Place 2 sprays into the nose daily.  1 Inhaler  12   No current facility-administered medications on file prior to visit.    ROS: Per HPI.  All other systems reviewed and are negative.   Physical Exam Filed Vitals:   06/02/13 0923  BP: 127/74  Pulse: 88  Temp: 98.2 F (36.8 C)  Resp: 18    Physical Examination: General appearance - alert, well appearing, and in no distress Eyes - left eye normal, right eye normal Ears - bilateral TM's and external ear canals normal Nose - mucosal congestion, mucosal pallor and sinus tenderness noted Maxillary B/L Mouth - MMM, no tonsillar exudates, + B/L tonsillar hypertrophy and postnasal drip/pharyngeal irritation Neck - supple, no significant adenopathy Lymphatics - no palpable lymphadenopathy Chest - clear to auscultation, no wheezes, rales or rhonchi, symmetric air entry Heart - normal rate and regular rhythm, no murmurs noted    Chemistry      Component Value Date/Time   NA 140 09/03/2012 1100   K 3.8 09/03/2012 1100   CL  100 09/03/2012 1100   CO2 30 09/03/2012 1100   BUN 17 09/03/2012 1100   CREATININE 0.57 09/03/2012 1100   CREATININE 0.53 06/21/2010 1119      Component Value Date/Time   CALCIUM 10.2 09/03/2012 1100   ALKPHOS 63 09/03/2012 1100   AST 12 09/03/2012 1100   ALT 16 09/03/2012 1100   BILITOT 0.5 09/03/2012 1100      Lab Results  Component Value Date   HGBA1C 5.8 06/02/2013

## 2013-06-02 NOTE — Patient Instructions (Signed)
Wanda Baker, it was nice seeing you today.  We hope you feel better and have a good holiday.  Please try tylenol, nasal saline and nasacort, Mucinex Extra Strength, and Delsym for cough.  If you do not have improvement in about 3-5 days, please call.  Thanks, Dr. Paulina Fusi

## 2013-08-07 ENCOUNTER — Other Ambulatory Visit: Payer: Self-pay | Admitting: Family Medicine

## 2013-08-10 ENCOUNTER — Other Ambulatory Visit: Payer: Self-pay | Admitting: *Deleted

## 2013-08-10 MED ORDER — OMEPRAZOLE 20 MG PO CPDR: DELAYED_RELEASE_CAPSULE | ORAL | Status: DC

## 2013-09-03 ENCOUNTER — Encounter: Payer: Self-pay | Admitting: Family Medicine

## 2013-09-03 ENCOUNTER — Ambulatory Visit (INDEPENDENT_AMBULATORY_CARE_PROVIDER_SITE_OTHER): Payer: BC Managed Care – PPO | Admitting: Family Medicine

## 2013-09-03 VITALS — BP 130/75 | HR 93 | Temp 98.3°F | Ht 67.5 in | Wt 157.0 lb

## 2013-09-03 DIAGNOSIS — M5412 Radiculopathy, cervical region: Secondary | ICD-10-CM

## 2013-09-03 DIAGNOSIS — M4722 Other spondylosis with radiculopathy, cervical region: Secondary | ICD-10-CM

## 2013-09-03 MED ORDER — TRAMADOL HCL 50 MG PO TABS: 50.0000 mg | ORAL_TABLET | Freq: Two times a day (BID) | ORAL | Status: DC

## 2013-09-03 NOTE — Patient Instructions (Signed)
Cut back your tylenol to 2 extra strength 3 times per day. Add the tramadol for pain. Continue all other meds Hopefully we can get the MRI done before your visit with Dr. Awanda Mink next week.

## 2013-09-03 NOTE — Assessment & Plan Note (Addendum)
Significant DDD on plain films and now Left arm weakness.  Will get MRI in consideration for possible future surg

## 2013-09-03 NOTE — Progress Notes (Signed)
   Subjective:    Patient ID: Wanda Baker, female    DOB: [birth year], 53 y.o.   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  Symptoms are becoming increasingly untollerable and interfering with lifestyle.  Would consider surg. No injury.  Just slow worsening.  Reviewed Xrays - sig DDD and suggestion of foraminal narrowing.   Review of Systems     Objective:   Physical ExamNeck, mild decrease ROM Left arm suggestion of weakness of tricepts. Normal DTRs        Assessment & Plan:

## 2013-09-07 ENCOUNTER — Ambulatory Visit
Admission: RE | Admit: 2013-09-07 | Discharge: 2013-09-07 | Disposition: A | Discharge status: Home / Self Care | Payer: BC Managed Care – PPO | Source: Ambulatory Visit | Attending: Family Medicine | Admitting: Family Medicine

## 2013-09-07 DIAGNOSIS — M4722 Other spondylosis with radiculopathy, cervical region: Secondary | ICD-10-CM

## 2013-09-10 ENCOUNTER — Ambulatory Visit (INDEPENDENT_AMBULATORY_CARE_PROVIDER_SITE_OTHER): Payer: BC Managed Care – PPO | Admitting: Family Medicine

## 2013-09-10 ENCOUNTER — Encounter: Payer: Self-pay | Admitting: Family Medicine

## 2013-09-10 VITALS — BP 125/66 | HR 81 | Temp 97.9°F | Ht 67.5 in | Wt 157.2 lb

## 2013-09-10 DIAGNOSIS — E119 Type 2 diabetes mellitus without complications: Secondary | ICD-10-CM

## 2013-09-10 DIAGNOSIS — E785 Hyperlipidemia, unspecified: Secondary | ICD-10-CM

## 2013-09-10 DIAGNOSIS — M4722 Other spondylosis with radiculopathy, cervical region: Secondary | ICD-10-CM

## 2013-09-10 DIAGNOSIS — I10 Essential (primary) hypertension: Secondary | ICD-10-CM

## 2013-09-10 DIAGNOSIS — M5412 Radiculopathy, cervical region: Secondary | ICD-10-CM

## 2013-09-10 LAB — LIPID PANEL
CHOL/HDL RATIO: 3.9 ratio
CHOLESTEROL: 124 mg/dL (ref 0–200)
HDL: 32 mg/dL — ABNORMAL LOW (ref >39)
LDL Cholesterol: 66 mg/dL (ref 0–99)
TRIGLYCERIDES: 128 mg/dL (ref <150)
VLDL: 26 mg/dL (ref 0–40)

## 2013-09-10 LAB — COMPREHENSIVE METABOLIC PANEL
ALT: 17 U/L (ref 0–35)
AST: 14 U/L (ref 0–37)
Albumin: 4.2 g/dL (ref 3.5–5.2)
Alkaline Phosphatase: 69 U/L (ref 39–117)
BILIRUBIN TOTAL: 0.4 mg/dL (ref 0.2–1.2)
BUN: 14 mg/dL (ref 6–23)
CALCIUM: 9.9 mg/dL (ref 8.4–10.5)
CHLORIDE: 101 meq/L (ref 96–112)
CO2: 29 meq/L (ref 19–32)
CREATININE: 0.58 mg/dL (ref 0.50–1.10)
GLUCOSE: 95 mg/dL (ref 70–99)
Potassium: 3.7 mEq/L (ref 3.5–5.3)
Sodium: 140 mEq/L (ref 135–145)
Total Protein: 7 g/dL (ref 6.0–8.3)

## 2013-09-10 LAB — POCT GLYCOSYLATED HEMOGLOBIN (HGB A1C): Hemoglobin A1C: 6

## 2013-09-10 NOTE — Progress Notes (Signed)
Wanda Baker is a 53 y.o. female who presents today for HTN, DM, HLD and Neck Pain.    HTN - Well controlled, compliant with Lisinopril and HCTZ, denies any chronic cough, palpitations, edema, HA, blurred vision, diplopia.  DM II - Well controlled on diet and metformin 500 mg qd, denies N/V/D.   Neck Pain - Continues to have paresthesias and occasional weakness of the L arm.  Previously had gone to Dr. Vertell Limber at [facility] for injections.  MRI showing significant cervical disease but no acute indication for intervention.  Tramadol working ok for her but would like further treatment.   Hyperlipidemia - Doing well on Lipitor 20 mg, compliant, denies myalgias or other SE.   Past Medical History  Diagnosis Date  . Allergy     allergic rhinitis  . Arthritis   . GERD (gastroesophageal reflux disease)   . Hypertension     History  Smoking status  . Former Smoker  . Quit date: 08/27/2009  Smokeless tobacco  . Never Used    Family History  Problem Relation Age of Onset  . Heart disease Maternal Grandmother   . Diabetes Maternal Grandmother   . Hypertension Maternal Grandmother   . Heart disease Maternal Grandfather   . Heart disease Paternal Grandmother   . Diabetes Paternal Grandmother   . Alzheimer's disease Paternal Grandmother     Current Outpatient Prescriptions on File Prior to Visit  Medication Sig Dispense Refill  . atorvastatin (LIPITOR) 20 MG tablet       . gabapentin (NEURONTIN) 600 MG tablet Take 600 mg by mouth 3 (three) times daily.      . hydrochlorothiazide (HYDRODIURIL) 25 MG tablet TAKE ONE TABLET BY MOUTH ONE TIME DAILY  90 tablet  3  . levocetirizine (XYZAL) 5 MG tablet Take 1 tablet (5 mg total) by mouth every evening.  30 tablet  4  . lisinopril (PRINIVIL,ZESTRIL) 5 MG tablet TAKE TWO TABLETS BY MOUTH DAILY  180 tablet  3  . metFORMIN (GLUCOPHAGE) 500 MG tablet TAKE 1 TABLET BY MOUTH TWICE DAILY WITH MEALS  60 tablet  2  . montelukast (SINGULAIR)  10 MG tablet Take 1 tablet (10 mg total) by mouth at bedtime.  30 tablet  5  . Omega-3 Fatty Acids (FISH OIL CONCENTRATE) 1000 MG CAPS Take by mouth.        [name] Kitchen omeprazole (PRILOSEC) 20 MG capsule TAKE ONE CAPSULE BY MOUTH EVERY DAY  30 capsule  2  . traMADol (ULTRAM) 50 MG tablet Take 1 tablet (50 mg total) by mouth 2 (two) times daily.  60 tablet  3  . triamcinolone (NASACORT AQ) 55 MCG/ACT nasal inhaler Place 2 sprays into the nose daily.  1 Inhaler  12   No current facility-administered medications on file prior to visit.    ROS: Per HPI.  All other systems reviewed and are negative.   Physical Exam Filed Vitals:   09/10/13 0945  BP: 125/66  Pulse: 81  Temp: 97.9 F (36.6 C)    Physical Examination: General appearance - alert, well appearing, and in no distress Neck - supple, limited ROM. + Spurling on L Extremities - Normal sensation, 5/5 MS, DTR 2/4 Chest - clear to auscultation, no wheezes, rales or rhonchi, symmetric air entry Heart - normal rate and regular rhythm, no murmurs noted Neurovascular intact in upper extremity B/L     Chemistry      Component Value Date/Time   NA 140 09/03/2012 1100   K  3.8 09/03/2012 1100   CL 100 09/03/2012 1100   CO2 30 09/03/2012 1100   BUN 17 09/03/2012 1100   CREATININE 0.57 09/03/2012 1100   CREATININE 0.53 06/21/2010 1119      Component Value Date/Time   CALCIUM 10.2 09/03/2012 1100   ALKPHOS 63 09/03/2012 1100   AST 12 09/03/2012 1100   ALT 16 09/03/2012 1100   BILITOT 0.5 09/03/2012 1100      Lab Results  Component Value Date   HGBA1C 6.0 09/10/2013     MRI Neck 3/20 reviewed and interpreted by myself today -  Degenerative cervical spondylosis involving both the discs and facets, especially C5/C6/C7 along with moderate spinal stenosis in C4/C5 region.

## 2013-09-10 NOTE — Assessment & Plan Note (Signed)
MRI showing substantial DJD and disc herniation at C5/C6/most pronounced at C7 along with some spinal stenosis and facet degeneration.  With ongoing worsening Sx, will send to neurosurgery for further evaluation.  Would like to see Dr. Vertell Limber. F/u in 6 weeks to see how she is doing.

## 2013-09-10 NOTE — Assessment & Plan Note (Signed)
Well controlled today, continue HCTZ, lisinopril.  CMET today    

## 2013-09-10 NOTE — Assessment & Plan Note (Signed)
Well controlled on diet and exercise with metformin 500 mg qd.  Discussed continuing medication at this time she would like to do this.  Recheck in three months, ophtho exam performed last week.      

## 2013-09-10 NOTE — Assessment & Plan Note (Signed)
Stable, on Lipitor 20 and according to new Lipid guidelines would qualify for 40 mg.  Would like to continue on 20 mg.  No myalgias. Lipid panel today     

## 2013-09-10 NOTE — Patient Instructions (Signed)
Ms. Quattrone, it was nice seeing you today.  A referral is in to see Dr. Vertell Limber, or whomever performs neck surgeries at [facility] neurosurgery.  I will see you back in 5-6 weeks.  Please call with questions or concerns.  Thanks, Dr. Awanda Mink

## 2013-11-04 ENCOUNTER — Other Ambulatory Visit: Payer: Self-pay | Admitting: Neurosurgery

## 2013-11-05 ENCOUNTER — Other Ambulatory Visit: Payer: Self-pay | Admitting: Family Medicine

## 2013-11-11 ENCOUNTER — Other Ambulatory Visit: Payer: Self-pay | Admitting: Family Medicine

## 2013-11-19 ENCOUNTER — Telehealth: Payer: Self-pay | Admitting: Family Medicine

## 2013-11-19 NOTE — Telephone Encounter (Signed)
Pt stated she thinks that the tramadol is making her vomit.  Pt advised to stop medication and contact the doctor that prescribed the medication.  Pt stated is a Psychologist, sport and exercise that prescribed it.  Pt stated that she is having some type of surgery next week.  If pain persist she should come in for an appt or go to urgent care.  Will forward to PCP for further advise.  Derl Barrow, RN

## 2013-11-19 NOTE — Telephone Encounter (Signed)
Mother calls stating pain meds are making patient sick. She has been nauseous and vomiting all morning. Would like to speak to a nurse. Please call .

## 2013-11-19 NOTE — Telephone Encounter (Signed)
Dr Caryl Bis covering for Dr Awanda Mink.   I agree with the advice to stop the tramadol and that the patient should discuss this with the surgeon who prescribed it. If this persists despite stopping the tramadol, the patient come in for and appointment or go to an urgent care for evaluation if this is over the weekend. Please advise the patient of this. Thanks.

## 2013-11-23 NOTE — Pre-Procedure Instructions (Signed)
Wanda Baker  11/23/2013   Your procedure is scheduled on:  Thurs,June 18 @ 7:30 AM  Report to [facility] at 5:30 AM.  Call this number if you have problems the morning of surgery: [phone]   Remember:   Do not eat food or drink liquids after midnight.   Take these medicines the morning of surgery with A SIP OF WATER: Gabapentin(Neurontin),Omeprazole(Prilosec),and Tramadol(Ultram-if needed)               Stop taking your Fish Oil. No Goody's,BC's,Aleve,Aspirin,Ibuprofen,Fish Oil,or any Herbal Medications   Do not wear jewelry, make-up or nail polish.  Do not wear lotions, powders, or perfumes. You may wear deodorant.  Do not shave 48 hours prior to surgery.   Do not bring valuables to the hospital.  [facility] is not responsible                  for any belongings or valuables.               Contacts, dentures or bridgework may not be worn into surgery.  Leave suitcase in the car. After surgery it may be brought to your room.  For patients admitted to the hospital, discharge time is determined by your                treatment team.               Patients discharged the day of surgery will not be allowed to drive  home.    Special Instructions:  [place] - Preparing for Surgery  Before surgery, you can play an important role.  Because skin is not sterile, your skin needs to be as free of germs as possible.  You can reduce the number of germs on you skin by washing with CHG (chlorahexidine gluconate) soap before surgery.  CHG is an antiseptic cleaner which kills germs and bonds with the skin to continue killing germs even after washing.  Please DO NOT use if you have an allergy to CHG or antibacterial soaps.  If your skin becomes reddened/irritated stop using the CHG and inform your nurse when you arrive at Short Stay.  Do not shave (including legs and underarms) for at least 48 hours prior to the first CHG shower.  You may shave your face.  Please follow these  instructions carefully:   1.  Shower with CHG Soap the night before surgery and the                                morning of Surgery.  2.  If you choose to wash your hair, wash your hair first as usual with your       normal shampoo.  3.  After you shampoo, rinse your hair and body thoroughly to remove the                      Shampoo.  4.  Use CHG as you would any other liquid soap.  You can apply chg directly       to the skin and wash gently with scrungie or a clean washcloth.  5.  Apply the CHG Soap to your body ONLY FROM THE NECK DOWN.        Do not use on open wounds or open sores.  Avoid contact with your eyes,       ears, mouth  and genitals (private parts).  Wash genitals (private parts)       with your normal soap.  6.  Wash thoroughly, paying special attention to the area where your surgery        will be performed.  7.  Thoroughly rinse your body with warm water from the neck down.  8.  DO NOT shower/wash with your normal soap after using and rinsing off       the CHG Soap.  9.  Pat yourself dry with a clean towel.            10.  Wear clean pajamas.            11.  Place clean sheets on your bed the night of your first shower and do not        sleep with pets.  Day of Surgery  Do not apply any lotions/deoderants the morning of surgery.  Please wear clean clothes to the hospital/surgery center.     Please read over the following fact sheets that you were given: Pain Booklet, Coughing and Deep Breathing, MRSA Information and Surgical Site Infection Prevention

## 2013-11-24 ENCOUNTER — Encounter (HOSPITAL_COMMUNITY)
Admission: RE | Admit: 2013-11-24 | Discharge: 2013-11-24 | Disposition: A | Discharge status: Home / Self Care | Payer: BC Managed Care – PPO | Source: Ambulatory Visit | Attending: Neurosurgery | Admitting: Neurosurgery

## 2013-11-24 ENCOUNTER — Encounter (HOSPITAL_COMMUNITY): Payer: Self-pay

## 2013-11-24 ENCOUNTER — Ambulatory Visit (HOSPITAL_COMMUNITY)
Admission: RE | Admit: 2013-11-24 | Discharge: 2013-11-24 | Disposition: A | Discharge status: Home / Self Care | Payer: BC Managed Care – PPO | Source: Ambulatory Visit | Attending: Anesthesiology | Admitting: Anesthesiology

## 2013-11-24 ENCOUNTER — Encounter (HOSPITAL_COMMUNITY): Payer: Self-pay | Admitting: Pharmacy Technician

## 2013-11-24 DIAGNOSIS — I10 Essential (primary) hypertension: Secondary | ICD-10-CM | POA: Insufficient documentation

## 2013-11-24 DIAGNOSIS — E119 Type 2 diabetes mellitus without complications: Secondary | ICD-10-CM | POA: Insufficient documentation

## 2013-11-24 HISTORY — DX: Personal history of other diseases of the respiratory system: Z87.09

## 2013-11-24 HISTORY — DX: Other chronic pain: G89.29

## 2013-11-24 HISTORY — DX: Type 2 diabetes mellitus without complications: E11.9

## 2013-11-24 HISTORY — DX: Dorsalgia, unspecified: M54.9

## 2013-11-24 HISTORY — DX: Weakness: R53.1

## 2013-11-24 HISTORY — DX: Hyperlipidemia, unspecified: E78.5

## 2013-11-24 LAB — CBC
HCT: 38.6 % (ref 36.0–46.0)
Hemoglobin: 12.5 g/dL (ref 12.0–15.0)
MCH: 27.1 pg (ref 26.0–34.0)
MCHC: 32.4 g/dL (ref 30.0–36.0)
MCV: 83.5 fL (ref 78.0–100.0)
PLATELETS: 320 10*3/uL (ref 150–400)
RBC: 4.62 MIL/uL (ref 3.87–5.11)
RDW: 13.1 % (ref 11.5–15.5)
WBC: 8.1 10*3/uL (ref 4.0–10.5)

## 2013-11-24 LAB — SURGICAL PCR SCREEN
MRSA, PCR: NEGATIVE
STAPHYLOCOCCUS AUREUS: NEGATIVE

## 2013-11-24 LAB — BASIC METABOLIC PANEL
BUN: 14 mg/dL (ref 6–23)
CALCIUM: 9.6 mg/dL (ref 8.4–10.5)
CO2: 30 meq/L (ref 19–32)
CREATININE: 0.53 mg/dL (ref 0.50–1.10)
Chloride: 101 mEq/L (ref 96–112)
GFR calc Af Amer: >90 mL/min (ref >90)
GFR calc non Af Amer: >90 mL/min (ref >90)
Glucose, Bld: 109 mg/dL — ABNORMAL HIGH (ref 70–99)
Potassium: 3.4 mEq/L — ABNORMAL LOW (ref 3.7–5.3)
Sodium: 142 mEq/L (ref 137–147)

## 2013-11-24 NOTE — Progress Notes (Addendum)
  Pt doesn't have a cardiologist  Denies ever having an echo/stress test/heart cath  Denies EKG or CXR in past yr   Medical Md is Dr.Bryan Hess

## 2013-12-01 MED ORDER — VANCOMYCIN HCL IN DEXTROSE 1-5 GM/200ML-% IV SOLN
1000.0000 mg | INTRAVENOUS | Status: AC
  Administered 2013-12-02: 1000 mg via INTRAVENOUS
  Filled 2013-12-01: qty 200

## 2013-12-02 ENCOUNTER — Encounter (HOSPITAL_COMMUNITY): Payer: Self-pay | Admitting: *Deleted

## 2013-12-02 ENCOUNTER — Inpatient Hospital Stay (HOSPITAL_COMMUNITY): Payer: BC Managed Care – PPO

## 2013-12-02 ENCOUNTER — Inpatient Hospital Stay (HOSPITAL_COMMUNITY): Payer: BC Managed Care – PPO | Admitting: Certified Registered"

## 2013-12-02 ENCOUNTER — Encounter (HOSPITAL_COMMUNITY): Payer: BC Managed Care – PPO | Admitting: Certified Registered"

## 2013-12-02 ENCOUNTER — Observation Stay (HOSPITAL_COMMUNITY)
Admission: RE | Admit: 2013-12-02 | Discharge: 2013-12-03 | Disposition: A | Discharge status: Home / Self Care | Payer: BC Managed Care – PPO | Source: Ambulatory Visit | Attending: Neurosurgery | Admitting: Neurosurgery

## 2013-12-02 ENCOUNTER — Encounter (HOSPITAL_COMMUNITY)
Admission: RE | Disposition: A | Discharge status: Home / Self Care | Payer: Self-pay | Source: Ambulatory Visit | Attending: Neurosurgery

## 2013-12-02 DIAGNOSIS — M4722 Other spondylosis with radiculopathy, cervical region: Secondary | ICD-10-CM | POA: Diagnosis present

## 2013-12-02 DIAGNOSIS — K219 Gastro-esophageal reflux disease without esophagitis: Secondary | ICD-10-CM | POA: Insufficient documentation

## 2013-12-02 DIAGNOSIS — M5020 Other cervical disc displacement, unspecified cervical region: Principal | ICD-10-CM | POA: Insufficient documentation

## 2013-12-02 DIAGNOSIS — M5030 Other cervical disc degeneration, unspecified cervical region: Secondary | ICD-10-CM | POA: Insufficient documentation

## 2013-12-02 DIAGNOSIS — E7800 Pure hypercholesterolemia, unspecified: Secondary | ICD-10-CM | POA: Insufficient documentation

## 2013-12-02 DIAGNOSIS — M129 Arthropathy, unspecified: Secondary | ICD-10-CM | POA: Insufficient documentation

## 2013-12-02 DIAGNOSIS — Z87891 Personal history of nicotine dependence: Secondary | ICD-10-CM | POA: Insufficient documentation

## 2013-12-02 DIAGNOSIS — E119 Type 2 diabetes mellitus without complications: Secondary | ICD-10-CM | POA: Insufficient documentation

## 2013-12-02 DIAGNOSIS — I10 Essential (primary) hypertension: Secondary | ICD-10-CM | POA: Insufficient documentation

## 2013-12-02 HISTORY — PX: ANTERIOR CERVICAL DECOMP/DISCECTOMY FUSION: SHX1161

## 2013-12-02 LAB — GLUCOSE, CAPILLARY
GLUCOSE-CAPILLARY: 143 mg/dL — AB (ref 70–99)
Glucose-Capillary: 118 mg/dL — ABNORMAL HIGH (ref 70–99)
Glucose-Capillary: 144 mg/dL — ABNORMAL HIGH (ref 70–99)
Glucose-Capillary: 117 mg/dL — ABNORMAL HIGH (ref 70–99)

## 2013-12-02 LAB — HEMOGLOBIN A1C
Hgb A1c MFr Bld: 6.2 % — ABNORMAL HIGH (ref <5.7)
Mean Plasma Glucose: 131 mg/dL — ABNORMAL HIGH (ref <117)

## 2013-12-02 SURGERY — ANTERIOR CERVICAL DECOMPRESSION/DISCECTOMY FUSION 3 LEVELS
Anesthesia: General | Site: Neck

## 2013-12-02 MED ORDER — BISACODYL 10 MG RE SUPP: 10.0000 mg | Freq: Every day | RECTAL | Status: DC | PRN

## 2013-12-02 MED ORDER — HYDROCHLOROTHIAZIDE 25 MG PO TABS
25.0000 mg | ORAL_TABLET | Freq: Every day | ORAL | Status: DC
  Administered 2013-12-02 – 2013-12-03 (×2): 25 mg via ORAL
  Filled 2013-12-02 (×2): qty 1

## 2013-12-02 MED ORDER — BUPIVACAINE HCL (PF) 0.5 % IJ SOLN
INTRAMUSCULAR | Status: DC | PRN
  Administered 2013-12-02: 3 mL

## 2013-12-02 MED ORDER — PANTOPRAZOLE SODIUM 40 MG PO TBEC
40.0000 mg | DELAYED_RELEASE_TABLET | Freq: Every day | ORAL | Status: DC
  Administered 2013-12-02 – 2013-12-03 (×2): 40 mg via ORAL
  Filled 2013-12-02 (×2): qty 1

## 2013-12-02 MED ORDER — FLEET ENEMA 7-19 GM/118ML RE ENEM
1.0000 | ENEMA | Freq: Once | RECTAL | Status: AC | PRN
  Filled 2013-12-02: qty 1

## 2013-12-02 MED ORDER — MIDAZOLAM HCL 5 MG/5ML IJ SOLN
INTRAMUSCULAR | Status: DC | PRN
  Administered 2013-12-02: 2 mg via INTRAVENOUS

## 2013-12-02 MED ORDER — DEXAMETHASONE SODIUM PHOSPHATE 10 MG/ML IJ SOLN
INTRAMUSCULAR | Status: DC | PRN
  Administered 2013-12-02: 10 mg via INTRAVENOUS

## 2013-12-02 MED ORDER — FLUTICASONE PROPIONATE 50 MCG/ACT NA SUSP
1.0000 | Freq: Every day | NASAL | Status: DC
  Filled 2013-12-02: qty 16

## 2013-12-02 MED ORDER — ATORVASTATIN CALCIUM 20 MG PO TABS
20.0000 mg | ORAL_TABLET | Freq: Every day | ORAL | Status: DC
  Administered 2013-12-02: 20 mg via ORAL
  Filled 2013-12-02 (×2): qty 1

## 2013-12-02 MED ORDER — OXYCODONE HCL 5 MG/5ML PO SOLN: 5.0000 mg | Freq: Once | ORAL | Status: AC | PRN

## 2013-12-02 MED ORDER — SODIUM CHLORIDE 0.9 % IJ SOLN
3.0000 mL | Freq: Two times a day (BID) | INTRAMUSCULAR | Status: DC
  Administered 2013-12-02 (×2): 3 mL via INTRAVENOUS

## 2013-12-02 MED ORDER — DEXAMETHASONE SODIUM PHOSPHATE 10 MG/ML IJ SOLN
INTRAMUSCULAR | Status: AC
  Filled 2013-12-02: qty 1

## 2013-12-02 MED ORDER — GABAPENTIN 600 MG PO TABS
600.0000 mg | ORAL_TABLET | Freq: Three times a day (TID) | ORAL | Status: DC
  Administered 2013-12-02 – 2013-12-03 (×3): 600 mg via ORAL
  Filled 2013-12-02 (×5): qty 1

## 2013-12-02 MED ORDER — ONDANSETRON HCL 4 MG/2ML IJ SOLN: 4.0000 mg | Freq: Four times a day (QID) | INTRAMUSCULAR | Status: DC | PRN

## 2013-12-02 MED ORDER — ACETAMINOPHEN 325 MG PO TABS: 650.0000 mg | ORAL_TABLET | ORAL | Status: DC | PRN

## 2013-12-02 MED ORDER — POLYETHYLENE GLYCOL 3350 17 G PO PACK
17.0000 g | PACK | Freq: Every day | ORAL | Status: DC | PRN
  Filled 2013-12-02: qty 1

## 2013-12-02 MED ORDER — 0.9 % SODIUM CHLORIDE (POUR BTL) OPTIME
TOPICAL | Status: DC | PRN
  Administered 2013-12-02: 1000 mL

## 2013-12-02 MED ORDER — ONDANSETRON HCL 4 MG/2ML IJ SOLN: 4.0000 mg | INTRAMUSCULAR | Status: DC | PRN

## 2013-12-02 MED ORDER — MONTELUKAST SODIUM 10 MG PO TABS
10.0000 mg | ORAL_TABLET | Freq: Every day | ORAL | Status: DC
  Administered 2013-12-02: 10 mg via ORAL
  Filled 2013-12-02 (×2): qty 1

## 2013-12-02 MED ORDER — MENTHOL 3 MG MT LOZG: 1.0000 | LOZENGE | OROMUCOSAL | Status: DC | PRN

## 2013-12-02 MED ORDER — FENTANYL CITRATE 0.05 MG/ML IJ SOLN
INTRAMUSCULAR | Status: DC | PRN
Start: 2013-12-02 — End: 2013-12-02
  Administered 2013-12-02 (×6): 25 ug via INTRAVENOUS
  Administered 2013-12-02: 100 ug via INTRAVENOUS

## 2013-12-02 MED ORDER — ONDANSETRON HCL 4 MG/2ML IJ SOLN
INTRAMUSCULAR | Status: DC | PRN
  Administered 2013-12-02: 4 mg via INTRAVENOUS

## 2013-12-02 MED ORDER — LIDOCAINE HCL (CARDIAC) 20 MG/ML IV SOLN
INTRAVENOUS | Status: DC | PRN
  Administered 2013-12-02: 80 mg via INTRAVENOUS

## 2013-12-02 MED ORDER — ROCURONIUM BROMIDE 100 MG/10ML IV SOLN
INTRAVENOUS | Status: DC | PRN
  Administered 2013-12-02: 50 mg via INTRAVENOUS

## 2013-12-02 MED ORDER — FENTANYL CITRATE 0.05 MG/ML IJ SOLN
INTRAMUSCULAR | Status: AC
  Filled 2013-12-02: qty 5

## 2013-12-02 MED ORDER — ONDANSETRON HCL 4 MG/2ML IJ SOLN
INTRAMUSCULAR | Status: AC
  Filled 2013-12-02: qty 2

## 2013-12-02 MED ORDER — INSULIN ASPART 100 UNIT/ML ~~LOC~~ SOLN
4.0000 [IU] | Freq: Three times a day (TID) | SUBCUTANEOUS | Status: DC
  Administered 2013-12-02 – 2013-12-03 (×2): 4 [IU] via SUBCUTANEOUS

## 2013-12-02 MED ORDER — MIDAZOLAM HCL 2 MG/2ML IJ SOLN
INTRAMUSCULAR | Status: AC
  Filled 2013-12-02: qty 2

## 2013-12-02 MED ORDER — METFORMIN HCL 500 MG PO TABS
500.0000 mg | ORAL_TABLET | Freq: Two times a day (BID) | ORAL | Status: DC
  Administered 2013-12-02 – 2013-12-03 (×2): 500 mg via ORAL
  Filled 2013-12-02 (×4): qty 1

## 2013-12-02 MED ORDER — HYDROMORPHONE HCL PF 1 MG/ML IJ SOLN
INTRAMUSCULAR | Status: AC
  Filled 2013-12-02: qty 1

## 2013-12-02 MED ORDER — LISINOPRIL 5 MG PO TABS
5.0000 mg | ORAL_TABLET | Freq: Every day | ORAL | Status: DC
  Administered 2013-12-02 – 2013-12-03 (×2): 5 mg via ORAL
  Filled 2013-12-02 (×2): qty 1

## 2013-12-02 MED ORDER — DIAZEPAM 5 MG PO TABS: 5.0000 mg | ORAL_TABLET | Freq: Four times a day (QID) | ORAL | Status: DC | PRN

## 2013-12-02 MED ORDER — ROCURONIUM BROMIDE 50 MG/5ML IV SOLN
INTRAVENOUS | Status: AC
  Filled 2013-12-02: qty 1

## 2013-12-02 MED ORDER — ARTIFICIAL TEARS OP OINT
TOPICAL_OINTMENT | OPHTHALMIC | Status: AC
  Filled 2013-12-02: qty 3.5

## 2013-12-02 MED ORDER — SODIUM CHLORIDE 0.9 % IJ SOLN: 3.0000 mL | INTRAMUSCULAR | Status: DC | PRN

## 2013-12-02 MED ORDER — TRAMADOL HCL 50 MG PO TABS
50.0000 mg | ORAL_TABLET | Freq: Two times a day (BID) | ORAL | Status: DC
  Administered 2013-12-02 – 2013-12-03 (×3): 50 mg via ORAL
  Filled 2013-12-02 (×3): qty 1

## 2013-12-02 MED ORDER — LACTATED RINGERS IV SOLN
INTRAVENOUS | Status: DC | PRN
  Administered 2013-12-02 (×2): via INTRAVENOUS

## 2013-12-02 MED ORDER — ALUM & MAG HYDROXIDE-SIMETH 200-200-20 MG/5ML PO SUSP: 30.0000 mL | Freq: Four times a day (QID) | ORAL | Status: DC | PRN

## 2013-12-02 MED ORDER — MORPHINE SULFATE 2 MG/ML IJ SOLN: 1.0000 mg | INTRAMUSCULAR | Status: DC | PRN

## 2013-12-02 MED ORDER — KCL IN DEXTROSE-NACL 20-5-0.45 MEQ/L-%-% IV SOLN
INTRAVENOUS | Status: DC
  Filled 2013-12-02 (×3): qty 1000

## 2013-12-02 MED ORDER — DOCUSATE SODIUM 100 MG PO CAPS
100.0000 mg | ORAL_CAPSULE | Freq: Two times a day (BID) | ORAL | Status: DC
  Administered 2013-12-02 – 2013-12-03 (×2): 100 mg via ORAL
  Filled 2013-12-02 (×3): qty 1

## 2013-12-02 MED ORDER — EPHEDRINE SULFATE 50 MG/ML IJ SOLN
INTRAMUSCULAR | Status: AC
  Filled 2013-12-02: qty 1

## 2013-12-02 MED ORDER — LIDOCAINE-EPINEPHRINE 1 %-1:100000 IJ SOLN
INTRAMUSCULAR | Status: DC | PRN
  Administered 2013-12-02: 3 mL

## 2013-12-02 MED ORDER — HYDROMORPHONE HCL PF 1 MG/ML IJ SOLN
0.2500 mg | INTRAMUSCULAR | Status: DC | PRN
Start: 2013-12-02 — End: 2013-12-02
  Administered 2013-12-02: 0.5 mg via INTRAVENOUS

## 2013-12-02 MED ORDER — INSULIN ASPART 100 UNIT/ML ~~LOC~~ SOLN: 0.0000 [IU] | Freq: Every day | SUBCUTANEOUS | Status: DC

## 2013-12-02 MED ORDER — OXYCODONE HCL 5 MG PO TABS
ORAL_TABLET | ORAL | Status: AC
  Filled 2013-12-02: qty 1

## 2013-12-02 MED ORDER — ARTIFICIAL TEARS OP OINT
TOPICAL_OINTMENT | OPHTHALMIC | Status: DC | PRN
  Administered 2013-12-02: 1 via OPHTHALMIC

## 2013-12-02 MED ORDER — THROMBIN 5000 UNITS EX SOLR
OROMUCOSAL | Status: DC | PRN
  Administered 2013-12-02: 09:00:00 via TOPICAL

## 2013-12-02 MED ORDER — THROMBIN 20000 UNITS EX SOLR
CUTANEOUS | Status: DC | PRN
  Administered 2013-12-02: 08:00:00 via TOPICAL

## 2013-12-02 MED ORDER — SENNA 8.6 MG PO TABS
1.0000 | ORAL_TABLET | Freq: Two times a day (BID) | ORAL | Status: DC
  Administered 2013-12-02 – 2013-12-03 (×2): 8.6 mg via ORAL
  Filled 2013-12-02 (×3): qty 1

## 2013-12-02 MED ORDER — SODIUM CHLORIDE 0.9 % IV SOLN
10.0000 mg | INTRAVENOUS | Status: DC | PRN
  Administered 2013-12-02: 20 ug/min via INTRAVENOUS

## 2013-12-02 MED ORDER — INSULIN ASPART 100 UNIT/ML ~~LOC~~ SOLN
0.0000 [IU] | Freq: Three times a day (TID) | SUBCUTANEOUS | Status: DC
  Administered 2013-12-02 – 2013-12-03 (×2): 2 [IU] via SUBCUTANEOUS

## 2013-12-02 MED ORDER — OXYCODONE HCL 5 MG PO TABS
5.0000 mg | ORAL_TABLET | Freq: Once | ORAL | Status: AC | PRN
  Administered 2013-12-02: 5 mg via ORAL

## 2013-12-02 MED ORDER — OXYCODONE-ACETAMINOPHEN 5-325 MG PO TABS
1.0000 | ORAL_TABLET | ORAL | Status: DC | PRN
  Administered 2013-12-03: 1 via ORAL
  Filled 2013-12-02: qty 1

## 2013-12-02 MED ORDER — PHENOL 1.4 % MT LIQD: 1.0000 | OROMUCOSAL | Status: DC | PRN

## 2013-12-02 MED ORDER — PROPOFOL 10 MG/ML IV BOLUS
INTRAVENOUS | Status: AC
  Filled 2013-12-02: qty 20

## 2013-12-02 MED ORDER — SODIUM CHLORIDE 0.9 % IJ SOLN
INTRAMUSCULAR | Status: AC
  Filled 2013-12-02: qty 10

## 2013-12-02 MED ORDER — ACETAMINOPHEN 650 MG RE SUPP: 650.0000 mg | RECTAL | Status: DC | PRN

## 2013-12-02 MED ORDER — PROPOFOL 10 MG/ML IV BOLUS
INTRAVENOUS | Status: DC | PRN
  Administered 2013-12-02: 170 mg via INTRAVENOUS

## 2013-12-02 MED ORDER — LIDOCAINE HCL (CARDIAC) 20 MG/ML IV SOLN
INTRAVENOUS | Status: AC
  Filled 2013-12-02: qty 5

## 2013-12-02 SURGICAL SUPPLY — 84 items
ADH SKN CLS APL DERMABOND .7 (GAUZE/BANDAGES/DRESSINGS) ×1
ADH SKN CLS LQ APL DERMABOND (GAUZE/BANDAGES/DRESSINGS) ×1
ALLOGRAFT LORDOTIC CC 7X11X14 (Bone Implant) ×2 IMPLANT
ALLOGRAFT TRIAD LORDOTIC CC (Bone Implant) ×4 IMPLANT
APL SKNCLS STERI-STRIP NONHPOA (GAUZE/BANDAGES/DRESSINGS) ×1
BAG DECANTER FOR FLEXI CONT (MISCELLANEOUS) ×3 IMPLANT
BENZOIN TINCTURE PRP APPL 2/3 (GAUZE/BANDAGES/DRESSINGS) ×2 IMPLANT
BIT DRILL NEURO 2X3.1 SFT TUCH (MISCELLANEOUS) ×1 IMPLANT
BIT DRILL POWER (BIT) IMPLANT
BLADE 10 SAFETY STRL DISP (BLADE) ×3 IMPLANT
BLADE ULTRA TIP 2M (BLADE) IMPLANT
BNDG GAUZE ELAST 4 BULKY (GAUZE/BANDAGES/DRESSINGS) IMPLANT
BUR BARREL STRAIGHT FLUTE 4.0 (BURR) ×3 IMPLANT
CANISTER SUCT 3000ML (MISCELLANEOUS) ×3 IMPLANT
CLOSURE WOUND 1/2 X4 (GAUZE/BANDAGES/DRESSINGS)
CONT SPEC 4OZ CLIKSEAL STRL BL (MISCELLANEOUS) ×3 IMPLANT
COVER MAYO STAND STRL (DRAPES) ×3 IMPLANT
DERMABOND ADHESIVE PROPEN (GAUZE/BANDAGES/DRESSINGS) ×2
DERMABOND ADVANCED (GAUZE/BANDAGES/DRESSINGS) ×2
DERMABOND ADVANCED .7 DNX12 (GAUZE/BANDAGES/DRESSINGS) ×1 IMPLANT
DERMABOND ADVANCED .7 DNX6 (GAUZE/BANDAGES/DRESSINGS) IMPLANT
DRAPE LAPAROTOMY 100X72 PEDS (DRAPES) ×3 IMPLANT
DRAPE MICROSCOPE LEICA (MISCELLANEOUS) ×3 IMPLANT
DRAPE POUCH INSTRU U-SHP 10X18 (DRAPES) ×3 IMPLANT
DRAPE PROXIMA HALF (DRAPES) IMPLANT
DRILL BIT POWER (BIT) ×3
DRILL NEURO 2X3.1 SOFT TOUCH (MISCELLANEOUS) ×3
DRSG OPSITE POSTOP 4X6 (GAUZE/BANDAGES/DRESSINGS) ×2 IMPLANT
DRSG TELFA 3X8 NADH (GAUZE/BANDAGES/DRESSINGS) IMPLANT
DURAPREP 6ML APPLICATOR 50/CS (WOUND CARE) ×3 IMPLANT
ELECT COATED BLADE 2.86 ST (ELECTRODE) ×3 IMPLANT
ELECT REM PT RETURN 9FT ADLT (ELECTROSURGICAL) ×3
ELECTRODE REM PT RTRN 9FT ADLT (ELECTROSURGICAL) ×1 IMPLANT
GAUZE SPONGE 4X4 16PLY XRAY LF (GAUZE/BANDAGES/DRESSINGS) IMPLANT
GLOVE BIO SURGEON STRL SZ8 (GLOVE) ×5 IMPLANT
GLOVE BIOGEL PI IND STRL 7.5 (GLOVE) IMPLANT
GLOVE BIOGEL PI IND STRL 8 (GLOVE) ×1 IMPLANT
GLOVE BIOGEL PI IND STRL 8.5 (GLOVE) ×1 IMPLANT
GLOVE BIOGEL PI INDICATOR 7.5 (GLOVE) ×2
GLOVE BIOGEL PI INDICATOR 8 (GLOVE)
GLOVE BIOGEL PI INDICATOR 8.5 (GLOVE) ×2
GLOVE ECLIPSE 8.0 STRL XLNG CF (GLOVE) ×1 IMPLANT
GLOVE EXAM NITRILE LRG STRL (GLOVE) IMPLANT
GLOVE EXAM NITRILE MD LF STRL (GLOVE) IMPLANT
GLOVE EXAM NITRILE XL STR (GLOVE) IMPLANT
GLOVE EXAM NITRILE XS STR PU (GLOVE) IMPLANT
GLOVE OPTIFIT SS 8.5 STRL (GLOVE) ×2 IMPLANT
GLOVE SS N UNI LF 7.5 STRL (GLOVE) ×6 IMPLANT
GOWN STRL REUS W/ TWL LRG LVL3 (GOWN DISPOSABLE) IMPLANT
GOWN STRL REUS W/ TWL XL LVL3 (GOWN DISPOSABLE) ×1 IMPLANT
GOWN STRL REUS W/TWL 2XL LVL3 (GOWN DISPOSABLE) ×3 IMPLANT
GOWN STRL REUS W/TWL LRG LVL3 (GOWN DISPOSABLE)
GOWN STRL REUS W/TWL XL LVL3 (GOWN DISPOSABLE) ×6
HALTER HD/CHIN CERV TRACTION D (MISCELLANEOUS) ×3 IMPLANT
KIT BASIN OR (CUSTOM PROCEDURE TRAY) ×3 IMPLANT
KIT ROOM TURNOVER OR (KITS) ×3 IMPLANT
NDL HYPO 25X1 1.5 SAFETY (NEEDLE) ×1 IMPLANT
NDL SPNL 18GX3.5 QUINCKE PK (NEEDLE) IMPLANT
NDL SPNL 22GX3.5 QUINCKE BK (NEEDLE) ×1 IMPLANT
NEEDLE HYPO 25X1 1.5 SAFETY (NEEDLE) ×3 IMPLANT
NEEDLE SPNL 18GX3.5 QUINCKE PK (NEEDLE) IMPLANT
NEEDLE SPNL 22GX3.5 QUINCKE BK (NEEDLE) ×3 IMPLANT
NS IRRIG 1000ML POUR BTL (IV SOLUTION) ×3 IMPLANT
PACK LAMINECTOMY NEURO (CUSTOM PROCEDURE TRAY) ×3 IMPLANT
PAD ARMBOARD 7.5X6 YLW CONV (MISCELLANEOUS) ×9 IMPLANT
PAD DRESSING TELFA 3X8 NADH (GAUZE/BANDAGES/DRESSINGS) IMPLANT
PIN DISTRACTION 14MM (PIN) ×6 IMPLANT
PLATE ARCHON 3-LEVEL 56MM (Plate) ×2 IMPLANT
RUBBERBAND STERILE (MISCELLANEOUS) ×6 IMPLANT
SCREW ARCHON SELFTAP 4.0X13 (Screw) ×16 IMPLANT
SPONGE GAUZE 4X4 12PLY (GAUZE/BANDAGES/DRESSINGS) IMPLANT
SPONGE INTESTINAL PEANUT (DISPOSABLE) ×3 IMPLANT
SPONGE SURGIFOAM ABS GEL 100 (HEMOSTASIS) ×3 IMPLANT
STAPLER SKIN PROX WIDE 3.9 (STAPLE) IMPLANT
STRIP CLOSURE SKIN 1/2X4 (GAUZE/BANDAGES/DRESSINGS) IMPLANT
SUT VIC AB 3-0 SH 8-18 (SUTURE) ×5 IMPLANT
SYR 20ML ECCENTRIC (SYRINGE) ×3 IMPLANT
SYR 5ML LL (SYRINGE) IMPLANT
TAPE STRIPS DRAPE STRL (GAUZE/BANDAGES/DRESSINGS) ×2 IMPLANT
TOWEL OR 17X24 6PK STRL BLUE (TOWEL DISPOSABLE) ×3 IMPLANT
TOWEL OR 17X26 10 PK STRL BLUE (TOWEL DISPOSABLE) ×3 IMPLANT
TRAP SPECIMEN MUCOUS 40CC (MISCELLANEOUS) IMPLANT
TRAY FOLEY CATH 14FRSI W/METER (CATHETERS) IMPLANT
WATER STERILE IRR 1000ML POUR (IV SOLUTION) ×3 IMPLANT

## 2013-12-02 NOTE — H&P (Signed)
[place],  [address] Phone: [phone]   Patient ID:   [id number] Patient: Wanda Baker  Date of Birth: 1961/02/26 Visit Type: Office Visit   Date: 11/03/2013 04:00 PM Provider: Marchia Meiers. Vertell Limber MD   This 53 year old female presents for neck pain.  History of Present Illness: 1.  neck pain  Rashaunda Rahl returns reporting neck pain and LUE weakness.    Tramadol 50mg  BID  MRI on [place]  I previously evaluated the patient for low back pain but now she comes in with 6 months of neck pain.  She is taking tramadol twice daily and has undergone some therapy for her neck but complains of persistent left upper extremity weakness without relief.  She describes her pain as 10 out of 10 and is worse and 5 out of 10 at other times.  A cervical MRI was reviewed which shows significant spondylosis and loss of cervical lordosis C4 C5, C5 C6, C6 C7 levels with left foraminal stenosis at each of these levels.  On examination today the patient is quite uncomfortable.  She has left deltoid strength at 4-5, left biceps and wrist extension strength at 4-5 and left triceps strength at 4+ out of 5 with left wrist flexion strength is similarly affected.  She complains of numbness in her left arm in a C5-C6 and C7 distribution and he appears to have decreased reflexes on the left upper extremity compared to the right.  She is a positive Spurling maneuver to the left.        PAST MEDICAL/SURGICAL HISTORY   (Detailed)  Disease/disorder Onset Date Management Date Comments  Diabetes type 2      High cholesterol      Hypertension          PAST MEDICAL HISTORY, SURGICAL HISTORY, FAMILY HISTORY, SOCIAL HISTORY AND REVIEW OF SYSTEMS I have reviewed the patient's past medical, surgical, family and social history as well as the comprehensive review of systems as included on the [facility] history form dated, which I have signed.  Family  History  (Detailed)  Relationship Family Member Name Deceased Age at Death Condition Onset Age Cause of Death      Family history of Hypertension  N   SOCIAL HISTORY  (Detailed) Tobacco use reviewed. Preferred language is [place].   Smoking status: Former smoker.  SMOKING STATUS Use Status Type Smoking Status Usage Per Day Years Used Total Pack Years  yes  Former smoker             MEDICATIONS(added, continued or stopped this visit):   Started Medication Directions Instruction Stopped   gabapentin 600 mg tablet take 1 tablet by oral route 3 times every day     hydrochlorothiazide 25 mg tablet take 1 tablet by oral route  every day     Lipitor 20 mg tablet take 1 tablet by oral route  every day     lisinopril 10 mg tablet take 1 tablet by oral route  every day     metformin 500 mg tablet take 1 tablet by oral route  every day with morning and evening meals     Prilosec 20 mg capsule,delayed release take 1 capsule by oral route  every day 30 minutes to 1 hour before a meal     tramadol 50 mg tablet take 1 tablet by oral route 2 times every day      ALLERGIES:  Ingredient Reaction Medication Name Comment  PENICILLINS Nausea    PREDNISONE Rash    HYDROCODONE BITARTRATE  Vicodin Tachycardia/dizziness  ACETAMINOPHEN  Vicodin Tachycardia/dizziness  Reviewed, updated.   Vitals Date Temp F BP Pulse Ht In Wt Lb BMI BSA Pain Score  11/03/2013  122/76 84 67 157 24.59  6/10      DIAGNOSTIC RESULTS Diagnostic report text  CLINICAL DATA: Neck and left arm pain.  EXAM: MRI CERVICAL SPINE WITHOUT CONTRAST  TECHNIQUE: Multiplanar, multisequence MR imaging was performed. No intravenous contrast was administered.  COMPARISON: Cervical spine radiographs 08/13/2012  FINDINGS: Moderate degenerative cervical spondylosis with multilevel disc disease and facet disease most notable at C5-6 and C6-7. The vertebral bodies demonstrate normal marrow signal except for endplate  reactive changes. The cervical spinal cord demonstrates normal signal intensity. No Chiari malformation.  C2-3: No significant findings. The facets are fused bilaterally.  C3-4: Mild degenerative disc disease with osteophytic ridging and mild uncinate spurring but no significant spinal or foraminal stenosis. The facets are fused bilaterally.  C4-5: Advanced degenerative disc disease and facet disease. Mild degenerative anterior subluxation of C4 with under uncovered bulging disc. This in combination with osteophytic ridging, uncinate spurring and left greater than right facet disease contributes to mild spinal stenosis with narrowing of the ventral CSF space. There is also moderate left foraminal stenosis.  C5-6: Bulging degenerated annulus, osteophytic ridging and uncinate spurring with flattening of the ventral thecal sac and narrowing of the ventral CSF space. There is also mild foraminal stenosis bilaterally, left greater than right.  C6-7: Advanced degenerative disc disease with a bulging annulus, osteophytic ridging and uncinate spurring. There is flattening of the ventral thecal sac and narrowing of the ventral CSF space. There is also mild to moderate left foraminal stenosis.  C7-T1: No significant findings.  IMPRESSION: Degenerative cervical spondylosis with multilevel disc disease and facet disease. There is mild spinal stenosis at C4-5, C5-6 and C6-7. There is moderate left foraminal stenosis at C4-5, mild foraminal stenosis bilaterally at C5-6 and mild to moderate left foraminal stenosis at C6-7.   Electronically Signed By: Kalman Jewels M.D. On: 09/07/2013 09:56    IMPRESSION Patient appears to have significant symptomatic foraminal stenosis at C4 C5, C5 C6, C6 C7 levels with left upper extremity weakness.  I have recommended that she undergo anterior cervical decompression and fusion at the C4 C5, C5 C6, C6 C7 levels.  Risks and benefits were discussed in  detail with the patient and she wishes to proceed.  She is fitted for a soft cervical collar  Assessment/Plan # Detail Type Description   1. Assessment Cervical disc disorder w/ radiculopathy (723.4).       2. 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       3. Assessment Neck pain (723.1).       4. Assessment Cervical disc degeneration (722.4).         Pain Assessment/Treatment Pain Scale: 6/10. Method: Numeric Pain Intensity Scale. Location: neck. Onset: 05/06/2013. Duration: varies. Quality: aching. Pain Assessment/Treatment follow-up plan of care: Patient currently taking pain medication as prescribed..  Surgery scheduled for December 02, 2013  Orders: Diagnostic Procedures: Assessment Procedure  723.4 ACDF - C4-C5 - C5-C6 - C6-C7             Provider:  Marchia Meiers. Vertell Limber MD  11/04/2013 07:24 PM Dictation edited by: Marchia Meiers. Vertell Limber    CC Providers: Dalbert Mayotte [address] [place], [place] [address] ----------------------------------------------------------------------------------------------------------------------------------------------------------------------         Electronically signed by  Marchia Meiers. Vertell Limber MD on 11/04/2013 07:24 PM

## 2013-12-02 NOTE — Anesthesia Preprocedure Evaluation (Addendum)
Anesthesia Evaluation  Patient identified by MRN, date of birth, ID band Patient awake    Reviewed: Allergy & Precautions, H&P , NPO status , Patient's Chart, lab work & pertinent test results  Airway Mallampati: II TM Distance: >3 FB Neck ROM: full    Dental  (+) Teeth Intact, Dental Advisory Given   Pulmonary former smoker,          Cardiovascular hypertension, Rhythm:Regular     Neuro/Psych  Neuromuscular disease    GI/Hepatic GERD-  ,  Endo/Other  diabetes, Type 2  Renal/GU      Musculoskeletal  (+) Arthritis -,   Abdominal   Peds  Hematology   Anesthesia Other Findings   Reproductive/Obstetrics                          Anesthesia Physical Anesthesia Plan  ASA: II  Anesthesia Plan: General   Post-op Pain Management:    Induction: Intravenous  Airway Management Planned: Oral ETT  Additional Equipment:   Intra-op Plan:   Post-operative Plan: Extubation in OR  Informed Consent: I have reviewed the patients History and Physical, chart, labs and discussed the procedure including the risks, benefits and alternatives for the proposed anesthesia with the patient or authorized representative who has indicated his/her understanding and acceptance.     Plan Discussed with: CRNA, Anesthesiologist and Surgeon  Anesthesia Plan Comments:         Anesthesia Quick Evaluation

## 2013-12-02 NOTE — Anesthesia Procedure Notes (Signed)
Procedure Name: Intubation Date/Time: 12/02/2013 7:38 AM Performed by: Barrington Ellison Pre-anesthesia Checklist: Patient identified, Emergency Drugs available, Suction available, Patient being monitored and Timeout performed Patient Re-evaluated:Patient Re-evaluated prior to inductionOxygen Delivery Method: Circle system utilized Preoxygenation: Pre-oxygenation with 100% oxygen Intubation Type: IV induction Ventilation: Mask ventilation without difficulty Laryngoscope Size: Mac Grade View: Grade I Tube type: Oral Tube size: 7.0 mm Number of attempts: 1 Airway Equipment and Method: Stylet and Video-laryngoscopy Placement Confirmation: ETT inserted through vocal cords under direct vision,  positive ETCO2 and breath sounds checked- equal and bilateral Secured at: 21 cm Tube secured with: Tape Dental Injury: Teeth and Oropharynx as per pre-operative assessment

## 2013-12-02 NOTE — Progress Notes (Signed)
Awake, alert, conversant.  MAEW with good power bilateral D/B/T.  Doing well.

## 2013-12-02 NOTE — Plan of Care (Signed)
Problem: Consults Goal: Diagnosis - Spinal Surgery Outcome: Completed/Met Date Met:  12/02/13 Cervical Spine Fusion

## 2013-12-02 NOTE — Transfer of Care (Signed)
Immediate Anesthesia Transfer of Care Note  Patient: ARTIS BUECHELE  Procedure(s) Performed: Procedure(s) with comments: ANTERIOR CERVICAL DECOMPRESSION/DISCECTOMY FUSION 3 LEVELS (N/A) - C4-5 C5-6 C6-7 Anterior cervical decompression/diskectomy/fusion  Patient Location: PACU  Anesthesia Type:General  Level of Consciousness: awake and oriented  Airway & Oxygen Therapy: Patient Spontanous Breathing and Patient connected to nasal cannula oxygen  Post-op Assessment: Report given to PACU RN and Patient moving all extremities X 4  Post vital signs: Reviewed and stable  Complications: No apparent anesthesia complications

## 2013-12-02 NOTE — Interval H&P Note (Signed)
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 After consideration of risks, benefits and other options for treatment, the patient has consented to  Procedure(s) with comments: ANTERIOR CERVICAL DECOMPRESSION/DISCECTOMY FUSION 3 LEVELS (N/A) - C4-5 C5-6 C6-7 Anterior cervical decompression/diskectomy/fusion as a surgical intervention .  The patient's history has been reviewed, patient examined, no change in status, stable for surgery.  I have reviewed the patient's chart and labs.  Questions were answered to the patient's satisfaction.     STERN,JOSEPH D

## 2013-12-02 NOTE — Anesthesia Postprocedure Evaluation (Signed)
Anesthesia Post Note  Patient: Wanda Baker  Procedure(s) Performed: Procedure(s) (LRB): ANTERIOR CERVICAL DECOMPRESSION/DISCECTOMY FUSION 3 LEVELS (N/A)  Anesthesia type: General  Patient location: PACU  Post pain: Pain level controlled and Adequate analgesia  Post assessment: Post-op Vital signs reviewed, Patient's Cardiovascular Status Stable, Respiratory Function Stable, Patent Airway and Pain level controlled  Last Vitals:  Filed Vitals:   12/02/13 1200  BP: 141/68  Pulse: 97  Temp: 36.8 C  Resp: 18    Post vital signs: Reviewed and stable  Level of consciousness: awake, alert  and oriented  Complications: No apparent anesthesia complications

## 2013-12-02 NOTE — Brief Op Note (Signed)
12/02/2013  10:55 AM  PATIENT:  Wanda Baker  53 y.o. 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, Cervical degenerative disc disease, Cervicalgia C 45, C 56, C 67  PROCEDURE:  Procedure(s) with comments: ANTERIOR CERVICAL DECOMPRESSION/DISCECTOMY FUSION 3 LEVELS (N/A) - C4-5 C5-6 C6-7 Anterior cervical decompression/diskectomy/fusion with allograft, autograft, plate  SURGEON:  Surgeon(s) and Role:    * Erline Levine, MD - Primary    * Ophelia Charter, MD - Assisting  PHYSICIAN ASSISTANT:   ASSISTANTS: none   ANESTHESIA:   general  EBL:  Total I/O In: 1000 [I.V.:1000] Out: 400 [Urine:350; Blood:50]  BLOOD ADMINISTERED:none  DRAINS: (7) Jackson-Pratt drain(s) with closed bulb suction in the prevertebral space   LOCAL MEDICATIONS USED:  LIDOCAINE   SPECIMEN:  No Specimen  DISPOSITION OF SPECIMEN:  N/A  COUNTS:  YES  TOURNIQUET:  * No tourniquets in log *  DICTATION: Patient was brought to operating room and following the smooth and uncomplicated induction of general endotracheal anesthesia her head was placed on a horseshoe head holder she was placed in 5 pounds of Holter traction and her anterior neck was prepped and draped in usual sterile fashion. An incision was made on the left side of midline after infiltrating the skin and subcutaneous tissues with local lidocaine. The platysmal layer was incised and subplatysmal dissection was performed exposing the anterior border sternocleidomastoid muscle. Using blunt dissection the carotid sheath was kept lateral and trachea and esophagus kept medial exposing the anterior cervical spine. A bent spinal needle was placed it was felt to be the C4-5 and C5-6 levels and this was confirmed on intraoperative x-ray. Longus coli muscles were taken  down from the anterior cervical spine using electrocautery and key elevator and self-retaining retractor was placed. The interspace at C6-7 was incised and a thorough discectomy was performed. Distraction pins were placed. Uncinate spurs and central spondylitic ridges were drilled down with a high-speed drill. The spinal cord dura and both C7 nerve roots were widely decompressed. Hemostasis was assured. After trial sizing a 6 mm machined lordotic allograft bone wedge was selected and packed with morsellized  autograft. The graft was tamped into position and countersunk appropriately. The retractor was moved and the interspace at C5-6 was incised and a thorough discectomy was performed. Distraction pins were placed. Uncinate spurs and central spondylitic ridges were drilled down with a high-speed drill. The spinal cord dura and both C6 nerve roots were widely decompressed. Hemostasis was assured. After trial sizing a similarly sized graft was selected and packed with profuse block and autograft. The graft was tamped into position and countersunk appropriately.The interspace at C4-5 was incised and a thorough discectomy was performed. Distraction pins were placed. Uncinate spurs and central spondylitic ridges were drilled down with a high-speed drill. The spinal cord dura and both C5 nerve roots were widely decompressed. A large osteophyte was removed on the left with decompression of the left C5 nerve root. Hemostasis was assured. After trial sizing a 7 mm machined lordotic allograft bone wedge was selected and packed with morsellized  autograft. The graft was tamped into position and countersunk appropriately.  Distraction weight was removed. A 56 mm Nuvasive Archon anterior cervical plate was affixed to the cervical spine with 13 mm variable-angle screws 2 at C4, 2 at C5, 2 at C6,  and 2 at C7. All screws  were well-positioned and locking mechanisms were engaged. Soft tissues were inspected and found to be in good  repair. The wound was irrigated. A final x-ray was obtained with good visualization at C4 with the interbody graft well visualized. A #7 JP drain was placed through a separate stab incision. The platysma layer was closed with 3-0 Vicryl stitches and the skin was reapproximated with 3-0 Vicryl subcuticular stitches. The wound was dressed with Dermabond. Counts were correct at the end of the case. Patient was extubated and taken to recovery in stable and satisfactory condition.    PLAN OF CARE: Admit for overnight observation  PATIENT DISPOSITION:  PACU - hemodynamically stable.   Delay start of Pharmacological VTE agent (>24hrs) due to surgical blood loss or risk of bleeding: yes

## 2013-12-02 NOTE — Op Note (Signed)
12/02/2013  10:55 AM  PATIENT:  Wanda Baker  53 y.o. 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, Cervical degenerative disc disease, Cervicalgia C 45, C 56, C 67  PROCEDURE:  Procedure(s) with comments: ANTERIOR CERVICAL DECOMPRESSION/DISCECTOMY FUSION 3 LEVELS (N/A) - C4-5 C5-6 C6-7 Anterior cervical decompression/diskectomy/fusion with allograft, autograft, plate  SURGEON:  Surgeon(s) and Role:    * Erline Levine, MD - Primary    * Ophelia Charter, MD - Assisting  PHYSICIAN ASSISTANT:   ASSISTANTS: none   ANESTHESIA:   general  EBL:  Total I/O In: 1000 [I.V.:1000] Out: 400 [Urine:350; Blood:50]  BLOOD ADMINISTERED:none  DRAINS: (7) Jackson-Pratt drain(s) with closed bulb suction in the prevertebral space   LOCAL MEDICATIONS USED:  LIDOCAINE   SPECIMEN:  No Specimen  DISPOSITION OF SPECIMEN:  N/A  COUNTS:  YES  TOURNIQUET:  * No tourniquets in log *  DICTATION: Patient was brought to operating room and following the smooth and uncomplicated induction of general endotracheal anesthesia her head was placed on a horseshoe head holder she was placed in 5 pounds of Holter traction and her anterior neck was prepped and draped in usual sterile fashion. An incision was made on the left side of midline after infiltrating the skin and subcutaneous tissues with local lidocaine. The platysmal layer was incised and subplatysmal dissection was performed exposing the anterior border sternocleidomastoid muscle. Using blunt dissection the carotid sheath was kept lateral and trachea and esophagus kept medial exposing the anterior cervical spine. A bent spinal needle was placed it was felt to be the C4-5 and C5-6 levels and this was confirmed on intraoperative x-ray. Longus coli muscles were taken  down from the anterior cervical spine using electrocautery and key elevator and self-retaining retractor was placed. The interspace at C6-7 was incised and a thorough discectomy was performed. Distraction pins were placed. Uncinate spurs and central spondylitic ridges were drilled down with a high-speed drill. The spinal cord dura and both C7 nerve roots were widely decompressed. Hemostasis was assured. After trial sizing a 6 mm machined lordotic allograft bone wedge was selected and packed with morsellized  autograft. The graft was tamped into position and countersunk appropriately. The retractor was moved and the interspace at C5-6 was incised and a thorough discectomy was performed. Distraction pins were placed. Uncinate spurs and central spondylitic ridges were drilled down with a high-speed drill. The spinal cord dura and both C6 nerve roots were widely decompressed. Hemostasis was assured. After trial sizing a similarly sized graft was selected and packed with profuse block and autograft. The graft was tamped into position and countersunk appropriately.The interspace at C4-5 was incised and a thorough discectomy was performed. Distraction pins were placed. Uncinate spurs and central spondylitic ridges were drilled down with a high-speed drill. The spinal cord dura and both C5 nerve roots were widely decompressed. A large osteophyte was removed on the left with decompression of the left C5 nerve root. Hemostasis was assured. After trial sizing a 7 mm machined lordotic allograft bone wedge was selected and packed with morsellized  autograft. The graft was tamped into position and countersunk appropriately.  Distraction weight was removed. A 56 mm Nuvasive Archon anterior cervical plate was affixed to the cervical spine with 13 mm variable-angle screws 2 at C4, 2 at C5, 2 at C6,  and 2 at C7. All screws  were well-positioned and locking mechanisms were engaged. Soft tissues were inspected and found to be in good  repair. The wound was irrigated. A final x-ray was obtained with good visualization at C4 with the interbody graft well visualized. A #7 JP drain was placed through a separate stab incision. The platysma layer was closed with 3-0 Vicryl stitches and the skin was reapproximated with 3-0 Vicryl subcuticular stitches. The wound was dressed with Dermabond. Counts were correct at the end of the case. Patient was extubated and taken to recovery in stable and satisfactory condition.    PLAN OF CARE: Admit for overnight observation  PATIENT DISPOSITION:  PACU - hemodynamically stable.   Delay start of Pharmacological VTE agent (>24hrs) due to surgical blood loss or risk of bleeding: yes

## 2013-12-03 LAB — GLUCOSE, CAPILLARY: Glucose-Capillary: 135 mg/dL — ABNORMAL HIGH (ref 70–99)

## 2013-12-03 MED ORDER — DIAZEPAM 5 MG PO TABS: 5.0000 mg | ORAL_TABLET | Freq: Four times a day (QID) | ORAL | Status: DC | PRN

## 2013-12-03 MED ORDER — OXYCODONE-ACETAMINOPHEN 5-325 MG PO TABS: 1.0000 | ORAL_TABLET | ORAL | Status: DC | PRN

## 2013-12-03 NOTE — Progress Notes (Signed)
UR Completed Brenda Graves-Bigelow, RN,BSN [phone]  

## 2013-12-03 NOTE — Progress Notes (Signed)
Pt. Alert and oriented, follows simple instructions, denies pain. Incision area without swelling, redness or S/S of infection. Voiding adequate clear yellow urine. Moving all extremities well and vitals stable and documented. Anterior Cervical Fusion surgery notes instructions given to patient and family member for home safety and precautions. Patient discharged home with family. Pt. and family stated understanding of instructions given.

## 2013-12-03 NOTE — Evaluation (Addendum)
Occupational Therapy Evaluation and Discharge Patient Details Name: Wanda Baker MRN: [id number] DOB: 07/24/1960 Today's Date: 12/03/2013    History of Present Illness ANTERIOR CERVICAL DECOMPRESSION/DISCECTOMY FUSION 3 LEVELS (N/A) - C4-5 C5-6 C6-7   Clinical Impression   This 53 yo female admitted and underwent above presents to acute OT with all education completed, we will D/C from acute OT and PT does not have any PT needs.    Follow Up Recommendations  No OT follow up    Equipment Recommendations  None recommended by OT       Precautions / Restrictions Precautions Precautions: Cervical Required Braces or Orthoses: Cervical Brace Cervical Brace: Soft collar Restrictions Weight Bearing Restrictions: No      Mobility Bed Mobility Overal bed mobility: Independent                Transfers Overall transfer level: Independent                         ADL Overall ADL's : Modified independent                                       General ADL Comments: Went over precautions with her and gave her a handout. Pt reports that pta she had left shoulder pain and decreased strength LUE--but was not dropping items she would try and hold. Pt reports both are somewhat better.  Strength in hand is at least 3+/5. Educated on safest way to rinse off in shower since she does not have a hand held shower. Says she can borrow a shower seat if she feels she needs it.               Pertinent Vitals/Pain No c/o pain     Hand Dominance RightRight   Extremity/Trunk Assessment Upper Extremity Assessment Upper Extremity Assessment: Overall WFL for tasks assessed              Cognition Arousal/Alertness: Awake/alert Behavior During Therapy: WFL for tasks assessed/performed Overall Cognitive Status: Within Functional Limits for tasks assessed                                Home Living Family/patient expects to be discharged to::  Private residence                                        Prior Functioning/Environment Level of Independence: Independent                      OT Goals(Current goals can be found in the care plan section) Acute Rehab OT Goals Patient Stated Goal: Home today  OT Frequency:                End of Session Nurse Communication:  (Pt ready to go)  Activity Tolerance: Patient tolerated treatment well Patient left:  (Sitting EOB)   Time: 1101-1111 OT Time Calculation (min): 10 min Charges:  OT General Charges $OT Visit: 1 Procedure OT Evaluation $Initial OT Evaluation Tier I: 1 Procedure G-Codes: OT G-codes **NOT FOR INPATIENT CLASS** Functional Assessment Tool Used: clinical observation Functional Limitation: Self care Self Care Current Status (H0623): At least 1 percent but less than 20 percent  impaired, limited or restricted Self Care Goal Status [id number]): At least 1 percent but less than 20 percent impaired, limited or restricted Self Care Discharge Status [phone]): At least 1 percent but less than 20 percent impaired, limited or restricted  Almon Register [phone] 12/03/2013, 11:37 AM

## 2013-12-03 NOTE — Discharge Summary (Signed)
Physician Discharge Summary  Patient ID: SHAKEETA GODETTE MRN: [id number] DOB/AGE: [birth year] 53 y.o.  Admit date: 12/02/2013 Discharge date: 12/03/2013  Admission Diagnoses:Cervical spondylosis, stenosis, radiculopathy, herniated disc C 45, C 56, C 67  Discharge Diagnoses: Cervical spondylosis, stenosis, radiculopathy, herniated disc C 45, C 56, C 67 Active Problems:   Cervical spondylosis with radiculopathy   Discharged Condition: good  Hospital Course: Uncomplicated anterior decompression and fusion C 45, C 56, C 67 levels  Consults: None  Significant Diagnostic Studies: None  Treatments: surgery: anterior decompression and fusion C 45, C 56, C 67 levels  Discharge Exam: Blood pressure 128/83, pulse 106, temperature 98.9 F (37.2 C), temperature source Oral, resp. rate 18, height 5\' 7"  (1.702 m), weight 68.947 kg (152 lb), SpO2 95.00%. Neurologic: Alert and oriented X 3, normal strength and tone. Normal symmetric reflexes. Normal coordination and gait Wound:CDI  Disposition: Home     Medication List         atorvastatin 20 MG tablet  Commonly known as:  LIPITOR  TAKE 1 TABLET BY MOUTH EVERY DAY     diazepam 5 MG tablet  Commonly known as:  VALIUM  Take 1 tablet (5 mg total) by mouth every 6 (six) hours as needed for muscle spasms.     FISH OIL CONCENTRATE 1000 MG Caps  Take by mouth.     gabapentin 600 MG tablet  Commonly known as:  NEURONTIN  Take 600 mg by mouth 3 (three) times daily.     hydrochlorothiazide 25 MG tablet  Commonly known as:  HYDRODIURIL  TAKE 1 TABLET BY MOUTH EVERY DAY     lisinopril 5 MG tablet  Commonly known as:  PRINIVIL,ZESTRIL  TAKE TWO TABLETS BY MOUTH DAILY     metFORMIN 500 MG tablet  Commonly known as:  GLUCOPHAGE  TAKE 1 TABLET BY MOUTH TWICE DAILY WITH MEALS     montelukast 10 MG tablet  Commonly known as:  SINGULAIR  Take 1 tablet (10 mg total) by mouth at bedtime.     omeprazole 20 MG capsule  Commonly known as:   PRILOSEC  TAKE ONE CAPSULE BY MOUTH EVERY DAY     oxyCODONE-acetaminophen 5-325 MG per tablet  Commonly known as:  PERCOCET/ROXICET  Take 1-2 tablets by mouth every 4 (four) hours as needed for moderate pain or severe pain.     traMADol 50 MG tablet  Commonly known as:  ULTRAM  Take 1 tablet (50 mg total) by mouth 2 (two) times daily.     triamcinolone 55 MCG/ACT nasal inhaler  Commonly known as:  NASACORT AQ  Place 2 sprays into the nose daily.         Signed: Peggyann Shoals, MD 12/03/2013, 10:35 AM

## 2013-12-03 NOTE — Discharge Instructions (Signed)

## 2013-12-03 NOTE — Progress Notes (Signed)
Subjective: Patient reports doing well  Objective: Vital signs in last 24 hours: Temp:  [97.6 F (36.4 C)-98.9 F (37.2 C)] 98.9 F (37.2 C) (06/19 0749) Pulse Rate:  [94-123] 106 (06/19 0749) Resp:  [18-23] 18 (06/19 0749) BP: (108-147)/(65-89) 128/83 mmHg (06/19 0749) SpO2:  [92 %-96 %] 95 % (06/19 0749)  Intake/Output from previous day: 06/18 0701 - 06/19 0700 In: 2150 [P.O.:650; I.V.:1500] Out: 415 [Urine:350; Drains:15; Blood:50] Intake/Output this shift:    Physical Exam: Full strength B D/B/T/HI.  MAEW.  Lab Results: No results found for this basename: WBC, HGB, HCT, PLT,  in the last 72 hours BMET No results found for this basename: NA, K, CL, CO2, GLUCOSE, BUN, CREATININE, CALCIUM,  in the last 72 hours  Studies/Results: Dg Cervical Spine 2-3 Views  12/02/2013   CLINICAL DATA:  ACDF C4 through C7  EXAM: CERVICAL SPINE - 2-3 VIEW  COMPARISON:  Cervical MRI 09/07/2013  FINDINGS: Two lateral views in the operating room were obtained.  Image number 1 reveals localization of the C4-5 and C5-6 disc spaces with needles.  Image number 2 reveals ACDF with anterior plate and interbody bone graft. The plate begins at C4 and extends through C6. Plate appears to extend the C7 however this area is not adequately penetrated for evaluation.  IMPRESSION: ACDF C4 through C7. The plate at C7 is not adequately evaluated due to underpenetration. Followup radiographs suggested.   Electronically Signed   By: Franchot Gallo M.D.   On: 12/02/2013 11:22    Assessment/Plan: Doing well.  D/C home    LOS: 1 day    Peggyann Shoals, MD 12/03/2013, 10:35 AM

## 2013-12-05 ENCOUNTER — Encounter (HOSPITAL_COMMUNITY): Payer: Self-pay | Admitting: Neurosurgery

## 2013-12-08 ENCOUNTER — Ambulatory Visit: Payer: BC Managed Care – PPO | Admitting: Family Medicine

## 2013-12-09 ENCOUNTER — Ambulatory Visit (HOSPITAL_COMMUNITY)
Admission: RE | Admit: 2013-12-09 | Discharge: 2013-12-09 | Disposition: A | Discharge status: Home / Self Care | Payer: BC Managed Care – PPO | Source: Ambulatory Visit | Attending: Family Medicine | Admitting: Family Medicine

## 2013-12-09 ENCOUNTER — Encounter: Payer: Self-pay | Admitting: Family Medicine

## 2013-12-09 ENCOUNTER — Ambulatory Visit (INDEPENDENT_AMBULATORY_CARE_PROVIDER_SITE_OTHER): Payer: BC Managed Care – PPO | Admitting: Family Medicine

## 2013-12-09 VITALS — BP 120/81 | HR 132 | Temp 98.5°F | Ht 67.0 in | Wt 142.0 lb

## 2013-12-09 DIAGNOSIS — R319 Hematuria, unspecified: Secondary | ICD-10-CM

## 2013-12-09 DIAGNOSIS — R300 Dysuria: Secondary | ICD-10-CM

## 2013-12-09 DIAGNOSIS — R000 Tachycardia, unspecified: Secondary | ICD-10-CM

## 2013-12-09 DIAGNOSIS — I498 Other specified cardiac arrhythmias: Secondary | ICD-10-CM | POA: Insufficient documentation

## 2013-12-09 LAB — POCT URINALYSIS DIPSTICK
GLUCOSE UA: NEGATIVE
Ketones, UA: NEGATIVE
Nitrite, UA: POSITIVE
Protein, UA: >=300
SPEC GRAV UA: 1.025
UROBILINOGEN UA: 2
pH, UA: 5.5

## 2013-12-09 LAB — POCT UA - MICROSCOPIC ONLY

## 2013-12-09 MED ORDER — SULFAMETHOXAZOLE-TMP DS 800-160 MG PO TABS: 1.0000 | ORAL_TABLET | Freq: Two times a day (BID) | ORAL | Status: DC

## 2013-12-09 MED ORDER — PHENAZOPYRIDINE HCL 100 MG PO TABS: 100.0000 mg | ORAL_TABLET | Freq: Three times a day (TID) | ORAL | Status: DC | PRN

## 2013-12-09 NOTE — Progress Notes (Signed)
Patient ID: AMARIYAH BAZAR, female   DOB: [birth year], 53 y.o.   MRN: [id number]  HPI:  Pt presents for a same day appointment to discuss dysuria and hematuria.  Patient states that this morning when she woke up she began having significant dysuria. She's noticed blood in her urine as well. She denies any fevers, back pain, or problems eating or drinking. She otherwise feels well. She recently had surgery on her neck about one week ago. She denies any history of kidney stones or history recently of a UTI. Prior to today she felt fine. She does not notice that her heart is beating quickly.   ROS: See HPI  [place]: History of hypertension, acid reflux, type 2 diabetes, hyperlipidemia, metabolic syndrome  PHYSICAL EXAM: BP 120/81  Pulse 132  Temp(Src) 98.5 F (36.9 C) (Oral)  Ht 5\' 7"  (1.702 m)  Wt 142 lb (64.411 kg)  BMI 22.24 kg/m2  SpO2 97% Gen: No acute distress, pleasant and cooperative HEENT: Normocephalic, atraumatic, moist mucous membranes Heart: Tachycardic, no murmurs, and questionable irregular rhythm Lungs: Clear to auscultation bilaterally, normal respiratory effort Abdomen: Soft, nontender to palpation in general, mild suprapubic tenderness appreciable. No masses. Neuro: Grossly nonfocal, speech normal, follows commands Back: No CVA tenderness bilaterally  ASSESSMENT/PLAN:  # Dysuria and hematuria: Urinalysis consistent with UTI. No signs of pyelonephritis at this time. Patient is overall well appearing and has no signs of systemic illness. Patient is allergic to penicillin, with history of hives and swelling, so we will not start cephalosporin. Will start Bactrim double strength one tablet twice daily for 3 days, will also add Pyridium for symptomatic relief. Will send urine for culture to ensure antibiotic susceptibility. Patient was noted to be tachycardic on her vital signs, and an EKG showed sinus tachycardia. No signs of atrial fib at this time. Counseled on reasons to  return. Patient will come back in one week to ensure she is feeling better, and to recheck her heart rate.  FOLLOW UP: F/u in one week for tachycardia and UTI.  [place]. Ardelia Mems, [place]

## 2013-12-09 NOTE — Patient Instructions (Signed)
F/u in 1 week to have your heart rate rechecked Go to urgent care or the ER this weekend if you have fevers/chills, back pain, worsening symptoms, chest pain, can't eat or drink, or have other concerns.  Urinary Tract Infection Urinary tract infections (UTIs) can develop anywhere along your urinary tract. Your urinary tract is your body's drainage system for removing wastes and extra water. Your urinary tract includes two kidneys, two ureters, a bladder, and a urethra. Your kidneys are a pair of bean-shaped organs. Each kidney is about the size of your fist. They are located below your ribs, one on each side of your spine. CAUSES Infections are caused by microbes, which are microscopic organisms, including fungi, viruses, and bacteria. These organisms are so small that they can only be seen through a microscope. Bacteria are the microbes that most commonly cause UTIs. SYMPTOMS  Symptoms of UTIs may vary by age and gender of the patient and by the location of the infection. Symptoms in young women typically include a frequent and intense urge to urinate and a painful, burning feeling in the bladder or urethra during urination. Older women and men are more likely to be tired, shaky, and weak and have muscle aches and abdominal pain. A fever may mean the infection is in your kidneys. Other symptoms of a kidney infection include pain in your back or sides below the ribs, nausea, and vomiting. DIAGNOSIS To diagnose a UTI, your caregiver will ask you about your symptoms. Your caregiver also will ask to provide a urine sample. The urine sample will be tested for bacteria and white blood cells. White blood cells are made by your body to help fight infection. TREATMENT  Typically, UTIs can be treated with medication. Because most UTIs are caused by a bacterial infection, they usually can be treated with the use of antibiotics. The choice of antibiotic and length of treatment depend on your symptoms and the type  of bacteria causing your infection. HOME CARE INSTRUCTIONS  If you were prescribed antibiotics, take them exactly as your caregiver instructs you. Finish the medication even if you feel better after you have only taken some of the medication.  Drink enough water and fluids to keep your urine clear or pale yellow.  Avoid caffeine, tea, and carbonated beverages. They tend to irritate your bladder.  Empty your bladder often. Avoid holding urine for long periods of time.  Empty your bladder before and after sexual intercourse.  After a bowel movement, women should cleanse from front to back. Use each tissue only once. SEEK MEDICAL CARE IF:   You have back pain.  You develop a fever.  Your symptoms do not begin to resolve within 3 days. SEEK IMMEDIATE MEDICAL CARE IF:   You have severe back pain or lower abdominal pain.  You develop chills.  You have nausea or vomiting.  You have continued burning or discomfort with urination. MAKE SURE YOU:   Understand these instructions.  Will watch your condition.  Will get help right away if you are not doing well or get worse. Document Released: 03/13/2005 Document Revised: 12/03/2011 Document Reviewed: 07/12/2011 [facility] Patient Information 2015 [place], [place]. This information is not intended to replace advice given to you by your health care Wanda Baker. Make sure you discuss any questions you have with your health care Wanda Baker.

## 2013-12-11 ENCOUNTER — Telehealth: Payer: Self-pay | Admitting: Family Medicine

## 2013-12-11 LAB — URINE CULTURE: Colony Count: >=100000

## 2013-12-11 MED ORDER — CIPROFLOXACIN HCL 500 MG PO TABS: 500.0000 mg | ORAL_TABLET | Freq: Two times a day (BID) | ORAL | Status: DC

## 2013-12-11 NOTE — Telephone Encounter (Signed)
Called pt as her urine culture grew e coli resistant to bactrim, which is the antibiotic we put her on. She is feeling better and urinary symptoms have improved. Advised that I recommend taking a different antibiotic (ciprofloxacin) since the bacteria was resistant and she is allergic to penicillins. Will send in cipro 500mg  BID x 7 days. Pt appreciative of the call.  Leeanne Rio, MD

## 2013-12-16 ENCOUNTER — Encounter: Payer: Self-pay | Admitting: Family Medicine

## 2013-12-16 ENCOUNTER — Ambulatory Visit (INDEPENDENT_AMBULATORY_CARE_PROVIDER_SITE_OTHER): Payer: BC Managed Care – PPO | Admitting: Family Medicine

## 2013-12-16 VITALS — BP 107/73 | HR 103 | Temp 97.7°F | Wt 150.0 lb

## 2013-12-16 DIAGNOSIS — R000 Tachycardia, unspecified: Secondary | ICD-10-CM | POA: Insufficient documentation

## 2013-12-16 NOTE — Patient Instructions (Signed)
I'm glad you are feeling better. Follow up with Dr. Awanda Mink as scheduled. We are checking your thyroid. I will call you if your test results are not normal.  Otherwise, I will send you a letter.  If you do not hear from me with in 2 weeks please call our office.      Be well, Dr. Ardelia Mems

## 2013-12-16 NOTE — Progress Notes (Signed)
Patient ID: Wanda Baker, female   DOB: [birth year], 53 y.o.   MRN: [id number]  HPI:  Pt presents to f/u after her UTI last week. She was tachycardic to 132 at that visit but was otherwise not systemically ill, so I asked her to return to have her vitals rechecked after treatment for her UTI. She grew e coli which was resistant to the bactrim I put her on, so she was switched by phone over the weekend to cipro. She has been taking this medication without problem. Still has a few days left. She denies any fevers, urinary symptoms, heart palpitations, chest pain, or SOB. She's been drinking plenty of water.  ROS: See HPI  [place]: History of hypertension, acid reflux, type 2 diabetes, hyperlipidemia, metabolic syndrome  PHYSICAL EXAM: BP 107/73  Pulse 103  Temp(Src) 97.7 F (36.5 C) (Oral)  Wt 150 lb (68.04 kg) Gen: NAD HEENT: NCAT Heart: mildly tachycardic, regular rhythm Lungs: CTAB, NWOB Neuro: grossly nonfocal, speech normal  ASSESSMENT/PLAN:  See problem based charting for additional assessment/plan.  FOLLOW UP: F/u as scheduled with Dr. Awanda Mink (PCP) for tachycardia and other chronic medical problems.  [place]. Ardelia Mems, [place]

## 2013-12-16 NOTE — Assessment & Plan Note (Signed)
Remains mildly tachycardic (HR 103) even after tx for UTI. No signs of systemic illness, pt is feeling well. Chart review shows a low TSH back in 2012 with no subsequent testing. Will check TSH today as hyperthyroidism could be contributing. F/u with PCP as scheduled in a few weeks.

## 2013-12-17 LAB — TSH: TSH: 0.347 u[IU]/mL — AB (ref 0.350–4.500)

## 2013-12-20 ENCOUNTER — Other Ambulatory Visit: Payer: Self-pay | Admitting: Family Medicine

## 2013-12-22 ENCOUNTER — Other Ambulatory Visit: Payer: Self-pay | Admitting: Family Medicine

## 2013-12-29 ENCOUNTER — Telehealth: Payer: Self-pay | Admitting: Family Medicine

## 2013-12-29 NOTE — Telephone Encounter (Signed)
[place] red team, Please call pt and let her know that her thyroid hormone level was a little low. Recommend f/u testing to check other thyroid studies. This could possibly explain her fast heartbeat. She has an appt with Dr. Awanda Mink tomorrow, he can discuss further testing with her at that time.\ Thanks, Leeanne Rio, MD

## 2013-12-30 ENCOUNTER — Ambulatory Visit (INDEPENDENT_AMBULATORY_CARE_PROVIDER_SITE_OTHER): Payer: BC Managed Care – PPO | Admitting: Family Medicine

## 2013-12-30 ENCOUNTER — Encounter: Payer: Self-pay | Admitting: Family Medicine

## 2013-12-30 VITALS — BP 118/77 | HR 105 | Temp 98.1°F | Wt 148.9 lb

## 2013-12-30 DIAGNOSIS — R000 Tachycardia, unspecified: Secondary | ICD-10-CM

## 2013-12-30 DIAGNOSIS — E119 Type 2 diabetes mellitus without complications: Secondary | ICD-10-CM

## 2013-12-30 DIAGNOSIS — M47812 Spondylosis without myelopathy or radiculopathy, cervical region: Secondary | ICD-10-CM

## 2013-12-30 DIAGNOSIS — E785 Hyperlipidemia, unspecified: Secondary | ICD-10-CM

## 2013-12-30 DIAGNOSIS — I10 Essential (primary) hypertension: Secondary | ICD-10-CM

## 2013-12-30 DIAGNOSIS — M4722 Other spondylosis with radiculopathy, cervical region: Secondary | ICD-10-CM

## 2013-12-30 LAB — T4, FREE: Free T4: 1.23 ng/dL (ref 0.80–1.80)

## 2013-12-30 LAB — T3, FREE: T3 FREE: 3.6 pg/mL (ref 2.3–4.2)

## 2013-12-30 NOTE — Progress Notes (Signed)
Wanda Baker is a 53 y.o. female who presents today for HTN, DM, HLD and Neck Pain.    HTN - Well controlled, compliant with Lisinopril and HCTZ, denies any chronic cough, palpitations, edema, HA, blurred vision, diplopia.  Tachycardia - Not tachycardic currently, no palpitations, had decreased TSH on exam from 12/16/13.  Denies blurred vision or heat intolerance.   DM II - Well controlled on diet and metformin 500 mg qd, denies N/V/D.   Neck Pain s/p anterior decompression w/ fusion of C4/C5/C6/C7 - Doing well post op on 6/19, still with some paresthesias and neck pain, but doing well on tramadol and Neurontin   Hyperlipidemia - Doing well on Lipitor 20 mg, compliant, denies myalgias or other SE.   Past Medical History  Diagnosis Date  . Allergy     uses Nasocort daily and takes Singulair nightly   . Arthritis   . Hyperlipidemia     takes Atorvastatin daily  . Diabetes mellitus without complication     taikes Metformin daily  . GERD (gastroesophageal reflux disease)     takes Omeprazole daily   . Hypertension     takes Lisinopril daily as well HCTZ  . History of bronchitis     many yrs ago  . Weakness     left arm  . Chronic back pain     buldging disc    History  Smoking status  . Former Smoker  Smokeless tobacco  . Never Used    Comment: quit smoking 21yrs ago    Family History  Problem Relation Age of Onset  . Heart disease Maternal Grandmother   . Diabetes Maternal Grandmother   . Hypertension Maternal Grandmother   . Heart disease Maternal Grandfather   . Heart disease Paternal Grandmother   . Diabetes Paternal Grandmother   . Alzheimer's disease Paternal Grandmother     Current Outpatient Prescriptions on File Prior to Visit  Medication Sig Dispense Refill  . atorvastatin (LIPITOR) 20 MG tablet TAKE 1 TABLET BY MOUTH EVERY DAY  90 tablet  1  . ciprofloxacin (CIPRO) 500 MG tablet Take 1 tablet (500 mg total) by mouth 2 (two) times daily. For 7 days  14  tablet  0  . diazepam (VALIUM) 5 MG tablet Take 1 tablet (5 mg total) by mouth every 6 (six) hours as needed for muscle spasms.  40 tablet  0  . gabapentin (NEURONTIN) 600 MG tablet Take 600 mg by mouth 3 (three) times daily.      . hydrochlorothiazide (HYDRODIURIL) 25 MG tablet TAKE 1 TABLET BY MOUTH EVERY DAY  90 tablet  1  . lisinopril (PRINIVIL,ZESTRIL) 5 MG tablet TAKE 2 TABLETS BY MOUTH EVERY DAY  180 tablet  2  . metFORMIN (GLUCOPHAGE) 500 MG tablet TAKE 1 TABLET BY MOUTH TWICE DAILY WITH MEALS  60 tablet  2  . montelukast (SINGULAIR) 10 MG tablet TAKE 1 TABLET BY MOUTH AT BEDTIME  30 tablet  11  . Omega-3 Fatty Acids (FISH OIL CONCENTRATE) 1000 MG CAPS Take by mouth.        [name] Kitchen omeprazole (PRILOSEC) 20 MG capsule TAKE ONE CAPSULE BY MOUTH EVERY DAY  30 capsule  2  . oxyCODONE-acetaminophen (PERCOCET/ROXICET) 5-325 MG per tablet Take 1-2 tablets by mouth every 4 (four) hours as needed for moderate pain or severe pain.  80 tablet  0  . phenazopyridine (PYRIDIUM) 100 MG tablet Take 1 tablet (100 mg total) by mouth 3 (three) times daily as needed for pain.  10 tablet  0  . traMADol (ULTRAM) 50 MG tablet Take 1 tablet (50 mg total) by mouth 2 (two) times daily.  60 tablet  3  . triamcinolone (NASACORT AQ) 55 MCG/ACT nasal inhaler Place 2 sprays into the nose daily.  1 Inhaler  12   No current facility-administered medications on file prior to visit.    ROS: Per HPI.  All other systems reviewed and are negative.   Physical Exam Filed Vitals:   12/30/13 1037  BP: 118/77  Pulse: 105  Temp: 98.1 F (36.7 C)    Physical Examination: General appearance - alert, well appearing, and in no distress HEENT: No exophthalmus observed  Neck - + healing incision L anterior neck, c/d/i.  No thyroidomegaly palpated  Extremities - Normal sensation, 5/5 MS, DTR 2/4 Chest - clear to auscultation, no wheezes, rales or rhonchi, symmetric air entry Heart - RRR, no murmurs appreciated  Neurovascular  intact in upper extremity B/L     Chemistry      Component Value Date/Time   NA 142 11/24/2013 0838   K 3.4* 11/24/2013 0838   CL 101 11/24/2013 0838   CO2 30 11/24/2013 0838   BUN 14 11/24/2013 0838   CREATININE 0.53 11/24/2013 0838   CREATININE 0.58 09/10/2013 1015      Component Value Date/Time   CALCIUM 9.6 11/24/2013 0838   ALKPHOS 69 09/10/2013 1015   AST 14 09/10/2013 1015   ALT 17 09/10/2013 1015   BILITOT 0.4 09/10/2013 1015      Lab Results  Component Value Date   HGBA1C 6.2* 12/02/2013    Lab Results  Component Value Date   TSH 0.347* 12/16/2013

## 2013-12-30 NOTE — Telephone Encounter (Signed)
Patient seen and discussed in Office visit today.

## 2013-12-30 NOTE — Assessment & Plan Note (Signed)
Check T3/T4 today, f/u in 3-4 weeks.

## 2013-12-30 NOTE — Patient Instructions (Signed)
Ms. Hiegel, it was nice seeing you today.  We will see you back in 3-4 weeks.  Thanks,  Dr. Awanda Mink

## 2013-12-30 NOTE — Assessment & Plan Note (Signed)
Stable, on Lipitor 20 and according to new Lipid guidelines would qualify for 40 mg.  Would like to continue on 20 mg.  No myalgias. Lipid panel today

## 2013-12-30 NOTE — Assessment & Plan Note (Signed)
Well controlled on diet and exercise with metformin 500 mg qd.  Discussed continuing medication at this time she would like to do this.  Recheck in three months, ophtho exam performed last week.

## 2013-12-30 NOTE — Assessment & Plan Note (Signed)
Well controlled today, continue HCTZ, lisinopril.  CMET today

## 2013-12-31 ENCOUNTER — Telehealth: Payer: Self-pay | Admitting: Family Medicine

## 2013-12-31 NOTE — Telephone Encounter (Signed)
Discussed results with the pt and recommend repeat testing in 2-3 months.  She had no questions.  Tamela Oddi Awanda Mink, DO of [facility] 12/31/2013, 8:09 AM

## 2014-01-27 ENCOUNTER — Ambulatory Visit: Payer: BC Managed Care – PPO | Admitting: Family Medicine

## 2014-02-10 ENCOUNTER — Ambulatory Visit: Payer: BC Managed Care – PPO | Admitting: Family Medicine

## 2014-02-17 ENCOUNTER — Ambulatory Visit (INDEPENDENT_AMBULATORY_CARE_PROVIDER_SITE_OTHER): Payer: Self-pay | Admitting: Family Medicine

## 2014-02-17 ENCOUNTER — Encounter: Payer: Self-pay | Admitting: Family Medicine

## 2014-02-17 VITALS — BP 137/83 | HR 105 | Temp 97.8°F | Ht 67.0 in | Wt 157.4 lb

## 2014-02-17 DIAGNOSIS — M4722 Other spondylosis with radiculopathy, cervical region: Secondary | ICD-10-CM

## 2014-02-17 DIAGNOSIS — M47812 Spondylosis without myelopathy or radiculopathy, cervical region: Secondary | ICD-10-CM

## 2014-02-17 DIAGNOSIS — E119 Type 2 diabetes mellitus without complications: Secondary | ICD-10-CM | POA: Diagnosis not present

## 2014-02-17 DIAGNOSIS — K219 Gastro-esophageal reflux disease without esophagitis: Secondary | ICD-10-CM

## 2014-02-17 DIAGNOSIS — I10 Essential (primary) hypertension: Secondary | ICD-10-CM

## 2014-02-17 DIAGNOSIS — R000 Tachycardia, unspecified: Secondary | ICD-10-CM | POA: Diagnosis not present

## 2014-02-17 LAB — POCT GLYCOSYLATED HEMOGLOBIN (HGB A1C): Hemoglobin A1C: 6.1

## 2014-02-17 MED ORDER — OMEPRAZOLE 20 MG PO CPDR: DELAYED_RELEASE_CAPSULE | ORAL | Status: DC

## 2014-02-17 NOTE — Progress Notes (Signed)
Wanda Baker is a 53 y.o. female who presents today for HTN, DM, HLD and Neck Pain.    HTN - Well controlled, compliant with Lisinopril and HCTZ, denies any chronic cough, palpitations, edema, HA, blurred vision, diplopia.  Tachycardia - Denies any further tachycardia or palpitations since las visit, had decreased TSH on exam from 12/16/13 but normal T3/T4.  Denies blurred vision or heat intolerance.   DM II - Well controlled on diet and metformin 500 mg qd, denies N/V/D.   Neck Pain s/p anterior decompression w/ fusion of C4/C5/C6/C7 - Doing well post op on 6/19, which have resolved at this point and to f/u with Dr. Vertell Limber PRN.   Hyperlipidemia - Doing well on Lipitor 20 mg, compliant, denies myalgias or other SE.   Past Medical History  Diagnosis Date  . Allergy     uses Nasocort daily and takes Singulair nightly   . Arthritis   . Hyperlipidemia     takes Atorvastatin daily  . Diabetes mellitus without complication     taikes Metformin daily  . GERD (gastroesophageal reflux disease)     takes Omeprazole daily   . Hypertension     takes Lisinopril daily as well HCTZ  . History of bronchitis     many yrs ago  . Weakness     left arm  . Chronic back pain     buldging disc    History  Smoking status  . Former Smoker  Smokeless tobacco  . Never Used    Comment: quit smoking 53yrs ago    Family History  Problem Relation Age of Onset  . Heart disease Maternal Grandmother   . Diabetes Maternal Grandmother   . Hypertension Maternal Grandmother   . Heart disease Maternal Grandfather   . Heart disease Paternal Grandmother   . Diabetes Paternal Grandmother   . Alzheimer's disease Paternal Grandmother     Current Outpatient Prescriptions on File Prior to Visit  Medication Sig Dispense Refill  . atorvastatin (LIPITOR) 20 MG tablet TAKE 1 TABLET BY MOUTH EVERY DAY  90 tablet  1  . ciprofloxacin (CIPRO) 500 MG tablet Take 1 tablet (500 mg total) by mouth 2 (two) times daily.  For 7 days  14 tablet  0  . diazepam (VALIUM) 5 MG tablet Take 1 tablet (5 mg total) by mouth every 6 (six) hours as needed for muscle spasms.  40 tablet  0  . gabapentin (NEURONTIN) 600 MG tablet Take 600 mg by mouth 3 (three) times daily.      . hydrochlorothiazide (HYDRODIURIL) 25 MG tablet TAKE 1 TABLET BY MOUTH EVERY DAY  90 tablet  1  . lisinopril (PRINIVIL,ZESTRIL) 5 MG tablet TAKE 2 TABLETS BY MOUTH EVERY DAY  180 tablet  2  . metFORMIN (GLUCOPHAGE) 500 MG tablet TAKE 1 TABLET BY MOUTH TWICE DAILY WITH MEALS  60 tablet  2  . montelukast (SINGULAIR) 10 MG tablet TAKE 1 TABLET BY MOUTH AT BEDTIME  30 tablet  11  . Omega-3 Fatty Acids (FISH OIL CONCENTRATE) 1000 MG CAPS Take by mouth.        [name] Kitchen omeprazole (PRILOSEC) 20 MG capsule TAKE ONE CAPSULE BY MOUTH EVERY DAY  30 capsule  2  . oxyCODONE-acetaminophen (PERCOCET/ROXICET) 5-325 MG per tablet Take 1-2 tablets by mouth every 4 (four) hours as needed for moderate pain or severe pain.  80 tablet  0  . phenazopyridine (PYRIDIUM) 100 MG tablet Take 1 tablet (100 mg total) by mouth 3 (three)  times daily as needed for pain.  10 tablet  0  . traMADol (ULTRAM) 50 MG tablet Take 1 tablet (50 mg total) by mouth 2 (two) times daily.  60 tablet  3  . triamcinolone (NASACORT AQ) 55 MCG/ACT nasal inhaler Place 2 sprays into the nose daily.  1 Inhaler  12   No current facility-administered medications on file prior to visit.    ROS: Per HPI.  All other systems reviewed and are negative.   Physical Exam Filed Vitals:   02/17/14 1533  BP: 137/83  Pulse: 105  Temp: 97.8 F (36.6 C)   Physical Examination: General appearance - alert, well appearing, and in no distress Extremities - Normal sensation, 5/5 MS, DTR 2/4 Chest - clear to auscultation, no wheezes, rales or rhonchi, symmetric air entry Heart - RRR, no murmurs appreciated  Neurovascular intact in upper extremity B/L     Chemistry      Component Value Date/Time   NA 142 11/24/2013 0838    K 3.4* 11/24/2013 0838   CL 101 11/24/2013 0838   CO2 30 11/24/2013 0838   BUN 14 11/24/2013 0838   CREATININE 0.53 11/24/2013 0838   CREATININE 0.58 09/10/2013 1015      Component Value Date/Time   CALCIUM 9.6 11/24/2013 0838   ALKPHOS 69 09/10/2013 1015   AST 14 09/10/2013 1015   ALT 17 09/10/2013 1015   BILITOT 0.4 09/10/2013 1015      Lab Results  Component Value Date   HGBA1C 6.2* 12/02/2013    Lab Results  Component Value Date   TSH 0.347* 12/16/2013   T3- 3.6 T4- 1.23

## 2014-02-17 NOTE — Assessment & Plan Note (Signed)
Well controlled today, continue HCTZ, lisinopril.

## 2014-02-17 NOTE — Assessment & Plan Note (Signed)
Doing well s/p decompression on 12/03/2013.  F/U with Dr. Vertell Limber PRN.

## 2014-02-17 NOTE — Assessment & Plan Note (Signed)
Check A1C today, if > 6, we will consider increasing her Metformin to 1000 mg BID.

## 2014-02-17 NOTE — Assessment & Plan Note (Signed)
Most likely related to subclinical hyperthyroidism 2/2 cervical neck surgery.  Recheck T3/T4/TSH today to make sure has resolved.  Has not had any more episodes.

## 2014-02-18 LAB — TSH: TSH: 0.228 u[IU]/mL — ABNORMAL LOW (ref 0.350–4.500)

## 2014-02-18 LAB — T4, FREE: Free T4: 1.15 ng/dL (ref 0.80–1.80)

## 2014-02-18 LAB — T3, FREE: T3, Free: 3.9 pg/mL (ref 2.3–4.2)

## 2014-03-22 ENCOUNTER — Other Ambulatory Visit: Payer: Self-pay

## 2014-03-22 DIAGNOSIS — Z1239 Encounter for other screening for malignant neoplasm of breast: Secondary | ICD-10-CM

## 2014-03-25 ENCOUNTER — Encounter: Payer: Self-pay | Admitting: Family Medicine

## 2014-03-25 ENCOUNTER — Ambulatory Visit (INDEPENDENT_AMBULATORY_CARE_PROVIDER_SITE_OTHER): Payer: BC Managed Care – PPO | Admitting: Family Medicine

## 2014-03-25 VITALS — BP 109/74 | HR 111 | Temp 98.1°F | Ht 67.0 in | Wt 154.4 lb

## 2014-03-25 DIAGNOSIS — M5442 Lumbago with sciatica, left side: Secondary | ICD-10-CM | POA: Diagnosis not present

## 2014-03-25 MED ORDER — CYCLOBENZAPRINE HCL 10 MG PO TABS: 10.0000 mg | ORAL_TABLET | Freq: Three times a day (TID) | ORAL | Status: DC | PRN

## 2014-03-25 NOTE — Progress Notes (Signed)
   Subjective:    Patient ID: Wanda Baker, female    DOB: 15-Jan-1961, 53 y.o.   MRN: [id number]  HPI Wanda Baker is a 53 y.o. female presents to same-day clinic for low back pain  Back pain: Patient presents to family medicine clinic with a 6/10 back pain located on her left lower lumbar area. She reports that her pain is mostly in the middle but does radiate to her left upper back and down her left leg. She has chronic back pain issues and is seen by pain management. She states she is unable to get into her pain management because she is not in the office on Fridays. She reports back spasms since Monday. She denies any trauma, injury, increased in activity that may have caused her back spasms to occur. She Has tried heat application, with minimal benefit. She is on gabapentin. She has tried multiple medications for her pain management. She feels that since Monday it is more acutely painful, and feels like she has "pulled her muscle ". She denies any fecal or urinary incontinence.   Review of Systems Per history of present illness    Objective:   Physical Exam BP 109/74  Pulse 111  Temp(Src) 98.1 F (36.7 C) (Oral)  Ht 5\' 7"  (1.702 m)  Wt 154 lb 6.4 oz (70.035 kg)  BMI 24.18 kg/m2 Gen: Pleasant, cooperative Caucasian female. No acute distress, nontoxic in appearance, well-developed, well-nourished. HEENT: AT. [place].  Bilateral eyes without injections or icterus. MMM.  CV: Tachycardic, no murmur appreciated. Chest: CTAB, no wheeze or crackles MSK:    Back: No erythema, no swelling, positive tissue texture change left lower erector spinae with spasm. Neuro: Normal gait. PERLA. EOMi. Alert. Cranial nerves II through XII intact          Assessment & Plan:

## 2014-03-25 NOTE — Patient Instructions (Signed)
Lumbosacral Strain Lumbosacral strain is a strain of any of the parts that make up your lumbosacral vertebrae. Your lumbosacral vertebrae are the bones that make up the lower third of your backbone. Your lumbosacral vertebrae are held together by muscles and tough, fibrous tissue (ligaments).  CAUSES  A sudden blow to your back can cause lumbosacral strain. Also, anything that causes an excessive stretch of the muscles in the low back can cause this strain. This is typically seen when people exert themselves strenuously, fall, lift heavy objects, bend, or crouch repeatedly. RISK FACTORS  Physically demanding work.  Participation in pushing or pulling sports or sports that require a sudden twist of the back (tennis, golf, baseball).  Weight lifting.  Excessive lower back curvature.  Forward-tilted pelvis.  Weak back or abdominal muscles or both.  Tight hamstrings. SIGNS AND SYMPTOMS  Lumbosacral strain may cause pain in the area of your injury or pain that moves (radiates) down your leg.  DIAGNOSIS Your health care provider can often diagnose lumbosacral strain through a physical exam. In some cases, you may need tests such as X-ray exams.  TREATMENT  Treatment for your lower back injury depends on many factors that your clinician will have to evaluate. However, most treatment will include the use of anti-inflammatory medicines. HOME CARE INSTRUCTIONS   Avoid hard physical activities (tennis, racquetball, waterskiing) if you are not in proper physical condition for it. This may aggravate or create problems.  If you have a back problem, avoid sports requiring sudden body movements. Swimming and walking are generally safer activities.  Maintain good posture.  Maintain a healthy weight.  For acute conditions, you may put ice on the injured area.  Put ice in a plastic bag.  Place a towel between your skin and the bag.  Leave the ice on for 20 minutes, 2-3 times a day.  When the  low back starts healing, stretching and strengthening exercises may be recommended. SEEK MEDICAL CARE IF:  Your back pain is getting worse.  You experience severe back pain not relieved with medicines. SEEK IMMEDIATE MEDICAL CARE IF:   You have numbness, tingling, weakness, or problems with the use of your arms or legs.  There is a change in bowel or bladder control.  You have increasing pain in any area of the body, including your belly (abdomen).  You notice shortness of breath, dizziness, or feel faint.  You feel sick to your stomach (nauseous), are throwing up (vomiting), or become sweaty.  You notice discoloration of your toes or legs, or your feet get very cold. MAKE SURE YOU:   Understand these instructions.  Will watch your condition.  Will get help right away if you are not doing well or get worse. Document Released: 03/13/2005 Document Revised: 06/08/2013 Document Reviewed: 01/20/2013 [facility] Patient Information 2015 [place], [place]. This information is not intended to replace advice given to you by your health care provider. Make sure you discuss any questions you have with your health care provider.  I have called in Flexeril for you to use, cautioned may cause drowsiness. Followup with her pain management doctor if further complications arise.

## 2014-03-25 NOTE — Assessment & Plan Note (Signed)
Patient with acute on chronic lower back pain today. She has known L5 radiculopathy with disc bulge, and sees pain management. Today he appears to have a muscle spasm of the left lower lumbar area. Prescribe Flexeril. Encouraged patient to continue to apply heat at 20 minute intervals. Followup with pain management as needed.

## 2014-04-07 ENCOUNTER — Other Ambulatory Visit: Payer: Self-pay

## 2014-04-07 DIAGNOSIS — Z1231 Encounter for screening mammogram for malignant neoplasm of breast: Secondary | ICD-10-CM

## 2014-04-14 ENCOUNTER — Ambulatory Visit: Payer: BC Managed Care – PPO

## 2014-04-28 ENCOUNTER — Ambulatory Visit
Admission: RE | Admit: 2014-04-28 | Discharge: 2014-04-28 | Disposition: A | Discharge status: Home / Self Care | Payer: BC Managed Care – PPO | Source: Ambulatory Visit

## 2014-04-28 DIAGNOSIS — Z1231 Encounter for screening mammogram for malignant neoplasm of breast: Secondary | ICD-10-CM

## 2014-05-05 ENCOUNTER — Other Ambulatory Visit: Payer: Self-pay | Admitting: Family Medicine

## 2014-05-26 ENCOUNTER — Other Ambulatory Visit: Payer: Self-pay | Admitting: Family Medicine

## 2014-06-03 ENCOUNTER — Ambulatory Visit (INDEPENDENT_AMBULATORY_CARE_PROVIDER_SITE_OTHER): Payer: BC Managed Care – PPO | Admitting: *Deleted

## 2014-06-03 ENCOUNTER — Ambulatory Visit: Payer: BC Managed Care – PPO | Admitting: *Deleted

## 2014-06-03 DIAGNOSIS — Z23 Encounter for immunization: Secondary | ICD-10-CM | POA: Diagnosis not present

## 2014-06-24 ENCOUNTER — Ambulatory Visit: Payer: BC Managed Care – PPO | Admitting: Family Medicine

## 2014-06-30 ENCOUNTER — Ambulatory Visit: Payer: Self-pay | Admitting: Family Medicine

## 2014-07-21 ENCOUNTER — Encounter: Payer: Self-pay | Admitting: Family Medicine

## 2014-07-21 ENCOUNTER — Ambulatory Visit (INDEPENDENT_AMBULATORY_CARE_PROVIDER_SITE_OTHER): Payer: BLUE CROSS/BLUE SHIELD | Admitting: Family Medicine

## 2014-07-21 VITALS — BP 124/70 | HR 87 | Temp 97.7°F | Ht 67.0 in | Wt 154.6 lb

## 2014-07-21 DIAGNOSIS — E785 Hyperlipidemia, unspecified: Secondary | ICD-10-CM | POA: Diagnosis not present

## 2014-07-21 DIAGNOSIS — I10 Essential (primary) hypertension: Secondary | ICD-10-CM | POA: Diagnosis not present

## 2014-07-21 DIAGNOSIS — E119 Type 2 diabetes mellitus without complications: Secondary | ICD-10-CM | POA: Diagnosis not present

## 2014-07-21 LAB — POCT GLYCOSYLATED HEMOGLOBIN (HGB A1C): Hemoglobin A1C: 6.8

## 2014-07-21 NOTE — Assessment & Plan Note (Signed)
Well-controlled, continue current regimen 

## 2014-07-21 NOTE — Progress Notes (Signed)
Wanda Baker is a 54 y.o. female who presents today for HTN, DM, HLD   HTN - Well controlled, compliant with Lisinopril and HCTZ, denies any chronic cough, palpitations, edema, HA, blurred vision, diplopia.  DM II - Well controlled on diet and metformin 500 mg qd, denies N/V/D.   Hyperlipidemia - Doing well on Lipitor 20 mg, compliant, denies myalgias or other SE.   Past Medical History  Diagnosis Date  . Allergy     uses Nasocort daily and takes Singulair nightly   . Arthritis   . Hyperlipidemia     takes Atorvastatin daily  . Diabetes mellitus without complication     taikes Metformin daily  . GERD (gastroesophageal reflux disease)     takes Omeprazole daily   . Hypertension     takes Lisinopril daily as well HCTZ  . History of bronchitis     many yrs ago  . Weakness     left arm  . Chronic back pain     buldging disc    History  Smoking status  . Former Smoker  Smokeless tobacco  . Never Used    Comment: quit smoking 55yrs ago    Family History  Problem Relation Age of Onset  . Heart disease Maternal Grandmother   . Diabetes Maternal Grandmother   . Hypertension Maternal Grandmother   . Heart disease Maternal Grandfather   . Heart disease Paternal Grandmother   . Diabetes Paternal Grandmother   . Alzheimer's disease Paternal Grandmother     Current Outpatient Prescriptions on File Prior to Visit  Medication Sig Dispense Refill  . atorvastatin (LIPITOR) 20 MG tablet TAKE 1 TABLET BY MOUTH EVERY DAY 90 tablet 3  . ciprofloxacin (CIPRO) 500 MG tablet Take 1 tablet (500 mg total) by mouth 2 (two) times daily. For 7 days 14 tablet 0  . cyclobenzaprine (FLEXERIL) 10 MG tablet Take 1 tablet (10 mg total) by mouth 3 (three) times daily as needed for muscle spasms. 30 tablet 0  . diazepam (VALIUM) 5 MG tablet Take 1 tablet (5 mg total) by mouth every 6 (six) hours as needed for muscle spasms. 40 tablet 0  . gabapentin (NEURONTIN) 600 MG tablet Take 600 mg by mouth 3  (three) times daily.    . hydrochlorothiazide (HYDRODIURIL) 25 MG tablet TAKE 1 TABLET BY MOUTH EVERY DAY 90 tablet 3  . lisinopril (PRINIVIL,ZESTRIL) 5 MG tablet TAKE 2 TABLETS BY MOUTH EVERY DAY 180 tablet 2  . metFORMIN (GLUCOPHAGE) 500 MG tablet TAKE 1 TABLET BY MOUTH TWICE DAILY WITH MEALS 60 tablet 11  . montelukast (SINGULAIR) 10 MG tablet TAKE 1 TABLET BY MOUTH AT BEDTIME 30 tablet 11  . Omega-3 Fatty Acids (FISH OIL CONCENTRATE) 1000 MG CAPS Take by mouth.      [name] Kitchen omeprazole (PRILOSEC) 20 MG capsule TAKE ONE CAPSULE BY MOUTH EVERY DAY 30 capsule 5  . oxyCODONE-acetaminophen (PERCOCET/ROXICET) 5-325 MG per tablet Take 1-2 tablets by mouth every 4 (four) hours as needed for moderate pain or severe pain. 80 tablet 0  . phenazopyridine (PYRIDIUM) 100 MG tablet Take 1 tablet (100 mg total) by mouth 3 (three) times daily as needed for pain. 10 tablet 0  . traMADol (ULTRAM) 50 MG tablet Take 1 tablet (50 mg total) by mouth 2 (two) times daily. 60 tablet 3  . triamcinolone (NASACORT AQ) 55 MCG/ACT nasal inhaler Place 2 sprays into the nose daily. 1 Inhaler 12   No current facility-administered medications on file prior to  visit.    ROS: Per HPI.  All other systems reviewed and are negative.   Physical Exam Filed Vitals:   07/21/14 1136  BP: 124/70  Pulse: 87  Temp: 97.7 F (36.5 C)   Physical Examination: General appearance - alert, well appearing, and in no distress Extremities - Normal sensation, 5/5 MS, DTR 2/4 Chest - clear to auscultation, no wheezes, rales or rhonchi, symmetric air entry Heart - RRR, no murmurs appreciated  Neurovascular intact in upper extremity B/L     Chemistry      Component Value Date/Time   NA 142 11/24/2013 0838   K 3.4* 11/24/2013 0838   CL 101 11/24/2013 0838   CO2 30 11/24/2013 0838   BUN 14 11/24/2013 0838   CREATININE 0.53 11/24/2013 0838   CREATININE 0.58 09/10/2013 1015      Component Value Date/Time   CALCIUM 9.6 11/24/2013 0838    ALKPHOS 69 09/10/2013 1015   AST 14 09/10/2013 1015   ALT 17 09/10/2013 1015   BILITOT 0.4 09/10/2013 1015      Lab Results  Component Value Date   HGBA1C 6.8 07/21/2014    Lab Results  Component Value Date   TSH 0.228* 02/17/2014   T3- 3.6 T4- 1.23

## 2014-07-21 NOTE — Assessment & Plan Note (Signed)
Continue with Lipitor, consider switching to 40-80 mg due to ASCVD risk

## 2014-07-21 NOTE — Assessment & Plan Note (Signed)
Continue Metformin, A1C in 4-5 months

## 2014-07-27 ENCOUNTER — Ambulatory Visit (INDEPENDENT_AMBULATORY_CARE_PROVIDER_SITE_OTHER): Payer: BLUE CROSS/BLUE SHIELD | Admitting: Family Medicine

## 2014-07-27 ENCOUNTER — Telehealth: Payer: Self-pay | Admitting: Family Medicine

## 2014-07-27 ENCOUNTER — Encounter: Payer: Self-pay | Admitting: Family Medicine

## 2014-07-27 VITALS — BP 129/85 | HR 86 | Temp 98.6°F | Ht 67.0 in | Wt 156.0 lb

## 2014-07-27 DIAGNOSIS — R300 Dysuria: Secondary | ICD-10-CM

## 2014-07-27 DIAGNOSIS — N3001 Acute cystitis with hematuria: Secondary | ICD-10-CM

## 2014-07-27 LAB — POCT UA - MICROSCOPIC ONLY: WBC, Ur, HPF, POC: >20

## 2014-07-27 LAB — POCT URINALYSIS DIPSTICK
Bilirubin, UA: NEGATIVE
Glucose, UA: NEGATIVE
Ketones, UA: NEGATIVE
Nitrite, UA: NEGATIVE
Protein, UA: NEGATIVE
SPEC GRAV UA: 1.02
UROBILINOGEN UA: 0.2
pH, UA: 5.5

## 2014-07-27 MED ORDER — FOSFOMYCIN TROMETHAMINE 3 G PO PACK: 3.0000 g | PACK | Freq: Once | ORAL | Status: DC

## 2014-07-27 MED ORDER — CIPROFLOXACIN HCL 250 MG PO TABS: 250.0000 mg | ORAL_TABLET | Freq: Two times a day (BID) | ORAL | Status: DC

## 2014-07-27 NOTE — Telephone Encounter (Signed)
Spoke with pt and informed her of below. Zimmerman Rumple, April D  

## 2014-07-27 NOTE — Telephone Encounter (Signed)
Will change Rx to cipro 250mg  take 1 tab po BID x 3 days #6, R: zero.

## 2014-07-27 NOTE — Assessment & Plan Note (Signed)
Uncomplicated.  - fosfomycin 3g x 1 dose - urine culture ordered

## 2014-07-27 NOTE — Patient Instructions (Addendum)
Thanks for coming in today.   It does indeed look like you have a urinary tract infection.  Take fosfomycin all at once by mouth. This will knock out the infection.   We will send your urine specimen for culture to make sure the bacteria is susceptible to this antibiotic.   If you start having any worsening pain, vomiting, unable to hold down fluids, fevers, or other concerns, do not hesitate to call the clinic at [phone] or go to the ED if after hours.    Take care! - Dr. Bonner Puna  Urinary Tract Infection Urinary tract infections (UTIs) can develop anywhere along your urinary tract. Your urinary tract is your body's drainage system for removing wastes and extra water. Your urinary tract includes two kidneys, two ureters, a bladder, and a urethra. Your kidneys are a pair of bean-shaped organs. Each kidney is about the size of your fist. They are located below your ribs, one on each side of your spine. CAUSES Infections are caused by microbes, which are microscopic organisms, including fungi, viruses, and bacteria. These organisms are so small that they can only be seen through a microscope. Bacteria are the microbes that most commonly cause UTIs. SYMPTOMS  Symptoms of UTIs may vary by age and gender of the patient and by the location of the infection. Symptoms in young women typically include a frequent and intense urge to urinate and a painful, burning feeling in the bladder or urethra during urination. Older women and men are more likely to be tired, shaky, and weak and have muscle aches and abdominal pain. A fever may mean the infection is in your kidneys. Other symptoms of a kidney infection include pain in your back or sides below the ribs, nausea, and vomiting. DIAGNOSIS To diagnose a UTI, your caregiver will ask you about your symptoms. Your caregiver also will ask to provide a urine sample. The urine sample will be tested for bacteria and white blood cells. White blood cells are made by  your body to help fight infection. TREATMENT  Typically, UTIs can be treated with medication. Because most UTIs are caused by a bacterial infection, they usually can be treated with the use of antibiotics. The choice of antibiotic and length of treatment depend on your symptoms and the type of bacteria causing your infection. HOME CARE INSTRUCTIONS  If you were prescribed antibiotics, take them exactly as your caregiver instructs you. Finish the medication even if you feel better after you have only taken some of the medication.  Drink enough water and fluids to keep your urine clear or pale yellow.  Avoid caffeine, tea, and carbonated beverages. They tend to irritate your bladder.  Empty your bladder often. Avoid holding urine for long periods of time.  Empty your bladder before and after sexual intercourse.  After a bowel movement, women should cleanse from front to back. Use each tissue only once. SEEK MEDICAL CARE IF:   You have back pain.  You develop a fever.  Your symptoms do not begin to resolve within 3 days. SEEK IMMEDIATE MEDICAL CARE IF:   You have severe back pain or lower abdominal pain.  You develop chills.  You have nausea or vomiting.  You have continued burning or discomfort with urination. MAKE SURE YOU:   Understand these instructions.  Will watch your condition.  Will get help right away if you are not doing well or get worse. Document Released: 03/13/2005 Document Revised: 12/03/2011 Document Reviewed: 07/12/2011 Exit[employer] Patient Information 2015 [employer]. This information is not intended to replace advice given to you by your health care provider. Make sure you discuss any questions you have with your health care provider.  

## 2014-07-27 NOTE — Telephone Encounter (Signed)
Prescription that was sent for fosfomycin won't be in until tomorrow afternoon.  Patient need something else sent because she need to start taking today.  Original med is on back order.  Please call patient when rx sent.

## 2014-07-27 NOTE — Progress Notes (Signed)
Subjective: Wanda Baker is a 54 y.o. female who complains of dysuria.   She complains of dysuria with pain occuring mostly at the end of urination. No nocturia, frequency, some urgency. No change in urine character. Denies abdominal pain, urinary retention, hematuria, nausea or vomiting, flank pain, fever, chills, or abnormal vaginal discharge or bleeding.   PMH: Positive for diabetes, Negative for immunocompromise. No history of nephrolithiasis, STI's, instrumentation ALLERGY: PCN (nausea and vomiting)  Objective: BP 129/85 mmHg  Pulse 86  Temp(Src) 98.6 F (37 C) (Oral)  Gen:  54 y.o. female in no distress Abd: Soft, NTND, BS present, no suprapubic tenderness. No CVA tenderness. Skin: No rashes noted MSK: No signs of joint effusions. No edema  07/27/2014 10:32  Nitrite, UA NEG  Leukocytes, UA Trace  RBC, UA MODERATE  Epithelial cells 1-5  WBC, Ur, HPF, POC >20  Bacteria, U Microscopic 1+  RBC, urine, microscopic 1-3   Assessment & Plan: Wanda Baker is a 54 y.o. female with uncomplicated urinary tract infection.  Uncomplicated UTI:  - Has allergy to PCN.  - Fosfomycin 3g x 1 - Follow urine culture - Defer any laboratory work up (Cr, electrolytes, cultures). Consider only if not improving.

## 2014-07-29 LAB — URINE CULTURE: Colony Count: 40000

## 2014-09-15 ENCOUNTER — Other Ambulatory Visit: Payer: Self-pay | Admitting: Family Medicine

## 2014-12-12 ENCOUNTER — Other Ambulatory Visit: Payer: Self-pay | Admitting: Family Medicine

## 2015-05-04 ENCOUNTER — Other Ambulatory Visit: Payer: Self-pay | Admitting: *Deleted

## 2015-05-04 MED ORDER — HYDROCHLOROTHIAZIDE 25 MG PO TABS: 25.0000 mg | ORAL_TABLET | Freq: Every day | ORAL | Status: DC

## 2015-07-24 ENCOUNTER — Other Ambulatory Visit: Payer: Self-pay | Admitting: *Deleted

## 2015-07-24 MED ORDER — METFORMIN HCL 500 MG PO TABS: 500.0000 mg | ORAL_TABLET | Freq: Two times a day (BID) | ORAL | Status: DC

## 2015-07-26 NOTE — Telephone Encounter (Signed)
Unable to reach by phone, will mail letter. Wanda Baker, [name]

## 2015-09-07 ENCOUNTER — Other Ambulatory Visit: Payer: Self-pay | Admitting: Family Medicine

## 2015-09-08 NOTE — Telephone Encounter (Signed)
Pt needs refill on HCTZ.  She only has one pill left for tomorrow. She requests it to be refilled today

## 2015-09-11 NOTE — Telephone Encounter (Signed)
LM with pt mother to have her call the office to inform her of below. If she calls back please try to schedule her an appointment for this.  Katharina Caper, Jamaia Brum D, [place]

## 2015-09-11 NOTE — Telephone Encounter (Signed)
Pt called on 09/07/15 to get a refill on her Hydrochlorothiazide. She has been without this for 2 days and is very upset that it was not called in. Please call this in today. jw

## 2015-09-14 NOTE — Telephone Encounter (Signed)
Pt has an appointment on 09/21/2015 with Dr. Raeford Razor. Ottis Stain, CMA

## 2015-09-21 ENCOUNTER — Other Ambulatory Visit (HOSPITAL_COMMUNITY)
Admission: RE | Admit: 2015-09-21 | Discharge: 2015-09-21 | Disposition: A | Discharge status: Home / Self Care | Payer: BLUE CROSS/BLUE SHIELD | Source: Ambulatory Visit | Attending: Family Medicine | Admitting: Family Medicine

## 2015-09-21 ENCOUNTER — Encounter: Payer: Self-pay | Admitting: Family Medicine

## 2015-09-21 ENCOUNTER — Ambulatory Visit (INDEPENDENT_AMBULATORY_CARE_PROVIDER_SITE_OTHER): Payer: Self-pay | Admitting: Family Medicine

## 2015-09-21 VITALS — BP 123/76 | HR 93 | Temp 97.8°F | Ht 67.0 in | Wt 136.7 lb

## 2015-09-21 DIAGNOSIS — I10 Essential (primary) hypertension: Secondary | ICD-10-CM

## 2015-09-21 DIAGNOSIS — E785 Hyperlipidemia, unspecified: Secondary | ICD-10-CM

## 2015-09-21 DIAGNOSIS — R7989 Other specified abnormal findings of blood chemistry: Secondary | ICD-10-CM | POA: Insufficient documentation

## 2015-09-21 DIAGNOSIS — E119 Type 2 diabetes mellitus without complications: Secondary | ICD-10-CM

## 2015-09-21 DIAGNOSIS — Z1159 Encounter for screening for other viral diseases: Secondary | ICD-10-CM

## 2015-09-21 DIAGNOSIS — Z01419 Encounter for gynecological examination (general) (routine) without abnormal findings: Secondary | ICD-10-CM | POA: Insufficient documentation

## 2015-09-21 DIAGNOSIS — Z0000 Encounter for general adult medical examination without abnormal findings: Secondary | ICD-10-CM | POA: Insufficient documentation

## 2015-09-21 DIAGNOSIS — Z1151 Encounter for screening for human papillomavirus (HPV): Secondary | ICD-10-CM | POA: Insufficient documentation

## 2015-09-21 DIAGNOSIS — Z124 Encounter for screening for malignant neoplasm of cervix: Secondary | ICD-10-CM

## 2015-09-21 DIAGNOSIS — R946 Abnormal results of thyroid function studies: Secondary | ICD-10-CM

## 2015-09-21 LAB — POCT URINALYSIS DIPSTICK
Bilirubin, UA: NEGATIVE
GLUCOSE UA: NEGATIVE
Ketones, UA: NEGATIVE
Leukocytes, UA: NEGATIVE
Nitrite, UA: NEGATIVE
PROTEIN UA: NEGATIVE
SPEC GRAV UA: 1.015
UROBILINOGEN UA: 0.2
pH, UA: 5.5

## 2015-09-21 LAB — POCT UA - MICROSCOPIC ONLY

## 2015-09-21 LAB — CBC
HCT: 36.7 % (ref 35.0–45.0)
Hemoglobin: 12.5 g/dL (ref 11.7–15.5)
MCH: 26.5 pg — ABNORMAL LOW (ref 27.0–33.0)
MCHC: 34.1 g/dL (ref 32.0–36.0)
MCV: 77.8 fL — ABNORMAL LOW (ref 80.0–100.0)
MPV: 8.3 fL (ref 7.5–12.5)
PLATELETS: 433 10*3/uL — AB (ref 140–400)
RBC: 4.72 MIL/uL (ref 3.80–5.10)
RDW: 14.2 % (ref 11.0–15.0)
WBC: 10.6 10*3/uL (ref 3.8–10.8)

## 2015-09-21 LAB — BASIC METABOLIC PANEL WITH GFR
BUN: 14 mg/dL (ref 7–25)
CO2: 25 mmol/L (ref 20–31)
CREATININE: 0.51 mg/dL (ref 0.50–1.05)
Calcium: 9.6 mg/dL (ref 8.6–10.4)
Chloride: 100 mmol/L (ref 98–110)
GFR, Est African American: >89 mL/min (ref >=60)
Glucose, Bld: 81 mg/dL (ref 65–99)
POTASSIUM: 3.8 mmol/L (ref 3.5–5.3)
Sodium: 138 mmol/L (ref 135–146)

## 2015-09-21 LAB — LIPID PANEL
CHOL/HDL RATIO: 4.6 ratio (ref <=5.0)
Cholesterol: 164 mg/dL (ref 125–200)
HDL: 36 mg/dL — ABNORMAL LOW (ref >=46)
LDL CALC: 89 mg/dL (ref <130)
Triglycerides: 197 mg/dL — ABNORMAL HIGH (ref <150)
VLDL: 39 mg/dL — ABNORMAL HIGH (ref <30)

## 2015-09-21 LAB — T4, FREE: Free T4: 1.3 ng/dL (ref 0.8–1.8)

## 2015-09-21 LAB — T3, FREE: T3 FREE: 3.3 pg/mL (ref 2.3–4.2)

## 2015-09-21 LAB — HEPATITIS C ANTIBODY: HCV Ab: NEGATIVE

## 2015-09-21 LAB — POCT GLYCOSYLATED HEMOGLOBIN (HGB A1C): Hemoglobin A1C: 5.7

## 2015-09-21 NOTE — Patient Instructions (Signed)
Thank you for coming in,   I will call or send a letter with the results from today.   Please bring all of your medications with you to each visit.   Sign up for My Chart to have easy access to your labs results, and communication with your Primary care physician   Please feel free to call with any questions or concerns at any time, at [phone]. --Dr. Raeford Razor  Diet Recommendations for Diabetes   Starchy (carb) foods include: Bread, rice, pasta, potatoes, corn, crackers, bagels, muffins, all baked goods.   Protein foods include: Meat, fish, poultry, eggs, dairy foods, and beans such as pinto and kidney beans (beans also provide carbohydrate).   1. Eat at least 3 meals and 1-2 snacks per day. Never go more than 4-5 hours while awake without eating.  2. Limit starchy foods to TWO per meal and ONE per snack. ONE portion of a starchy  food is equal to the following:   - ONE slice of bread (or its equivalent, such as half of a hamburger bun).   - 1/2 cup of a "scoopable" starchy food such as potatoes or rice.   - 1 OUNCE (28 grams) of starchy snack foods such as crackers or pretzels (look on label).   - 15 grams of carbohydrate as shown on food label.  3. Both lunch and dinner should include a protein food, a carb food, and vegetables.   - Obtain twice as many veg's as protein or carbohydrate foods for both lunch and dinner.   - Try to keep frozen veg's on hand for a quick vegetable serving.     - Fresh or frozen veg's are best.  4. Breakfast should always include protein.

## 2015-09-21 NOTE — Assessment & Plan Note (Signed)
Significant improvement with her diet and exercise.  - continue metformin  - if she continues to have improvement and/or stable A1c then could consider stopping metformin.

## 2015-09-21 NOTE — Assessment & Plan Note (Signed)
On statin  - continue current medication  - lipid panel today

## 2015-09-21 NOTE — Assessment & Plan Note (Signed)
This was observed with her work up for tachycardia  TSH will be taken today  Asymptomatic today  No goiter appreciated  - depending on lab work will determine work up

## 2015-09-21 NOTE — Addendum Note (Signed)
Addended by: [place] Pink on: 09/21/2015 03:52 PM   Modules accepted: Orders, SmartSet

## 2015-09-21 NOTE — Assessment & Plan Note (Signed)
Has had significant improvement in her weight from diet and exercise.  History of partial hysterectomy so pap performed today  Former smoker  Hep c screening today  She has follow up for eye exam.

## 2015-09-21 NOTE — Assessment & Plan Note (Signed)
Controlled continue current medications. 

## 2015-09-21 NOTE — Progress Notes (Signed)
Subjective:    Wanda Baker - 55 y.o. female MRN [id number]  Date of birth: 1961-06-05  CC annual exam   HPI  Wanda Baker is here for annual exam.  Annual Gynecological Exam  [phone]  Wt Readings from Last 3 Encounters:  09/21/15 136 lb 11.2 oz (62.007 kg)  07/27/14 156 lb (70.761 kg)  07/21/14 154 lb 9.6 oz (70.126 kg)   Last period:  hysterectomy in 2011 due to fibroids  Regular periods: no Heavy bleeding: no  Sexually active: no Birth control or hormonal therapy: no Hx of STD: no Dyspareunia: No Vaginal discharge: no Dysuria: No   Last mammogram: 2015 Breast mass or concerns: No Last Pap: 2016  History of abnormal pap: No   FH of breast, uterine, ovarian, colon cancer: No   CHRONIC DIABETES  Disease Monitoring  Blood Sugar Ranges: none   Polyuria: no   Visual problems: no   Medication Compliance: yes  Medication Side Effects  Hypoglycemia: no   Preventitive Health Care  Eye Exam: due for exam   Foot Exam: today   Diet pattern: cut out fried food, no sweets, no white bread   Exercise: she walks on a regular basis   HTN Disease Monitoring: Home BP Monitoring none  Medications:lisinpirl/HCTZ  Chest pain- no     Dyspnea- no Compliance-  yes.  Lightheadedness-  no   Edema- no    HYPERLIPIDEMIA Symptoms Chest pain on exertion:  no   . Leg claudication:   no Medications: lipitor  Compliance- yes  Right upper quadrant pain- no   Muscle aches- no     Component Value Date/Time   CHOL 124 09/10/2013 1015   TRIG 128 09/10/2013 1015   HDL 32* 09/10/2013 1015   VLDL 26 09/10/2013 1015   CHOLHDL 3.9 09/10/2013 1015    SH: former smoker  PMH: Hypertension, diabetes, hyperlipidemia  Health Maintenance:  - hep c, foot exam, pap smear and A1c today  Health Maintenance Due  Topic Date Due  . Hepatitis C Screening  1961/04/18  . HIV Screening  04/21/1975  . FOOT EXAM  09/11/2014  . OPHTHALMOLOGY EXAM  09/11/2014  . PAP SMEAR   12/23/2014  . HEMOGLOBIN A1C  01/19/2015    Review of Systems See HPI     Objective:   Physical Exam BP 123/76 mmHg  Pulse 93  Temp(Src) 97.8 F (36.6 C) (Oral)  Ht 5\' 7"  (1.702 m)  Wt 136 lb 11.2 oz (62.007 kg)  BMI 21.41 kg/m2 Gen: NAD, alert, cooperative with exam, well-appearing HEENT: NCAT, EOMI, clear conjunctiva, oropharynx clear, supple neck CV: RRR, good S1/S2, no murmur, no edema, capillary refill brisk  Resp: CTABL, no wheezes, non-labored Abd: SNTND, BS present, no guarding or organomegaly Skin: no rashes, normal turgor  Neuro: no gross deficits.  Psych: alert and oriented GU: > External: no lesions > Vagina: no blood in vault > Cervix: no lesion; no mucopurulent d/c;      Assessment & Plan:   HYPERTENSION, BENIGN ESSENTIAL Controlled  - continue current medications   Diabetes type 2, controlled ([place]) Significant improvement with her diet and exercise.  - continue metformin  - if she continues to have improvement and/or stable A1c then could consider stopping metformin.   Hyperlipidemia with target LDL less than 100 On statin  - continue current medication  - lipid panel today   Annual physical exam Has had significant improvement in her weight from diet and exercise.  History of partial  hysterectomy so pap performed today  Former smoker  Hep c screening today  She has follow up for eye exam.   Low TSH level This was observed with her work up for tachycardia  TSH will be taken today  Asymptomatic today  No goiter appreciated  - depending on lab work will determine work up

## 2015-09-22 LAB — CYTOLOGY - PAP

## 2015-09-26 ENCOUNTER — Encounter: Payer: Self-pay | Admitting: Family Medicine

## 2015-11-23 ENCOUNTER — Other Ambulatory Visit: Payer: Self-pay | Admitting: Family Medicine

## 2015-12-07 ENCOUNTER — Other Ambulatory Visit: Payer: Self-pay | Admitting: Family Medicine

## 2015-12-07 NOTE — Telephone Encounter (Signed)
This is your patient for med refill

## 2016-01-22 ENCOUNTER — Other Ambulatory Visit: Payer: Self-pay | Admitting: *Deleted

## 2016-01-22 MED ORDER — METFORMIN HCL 500 MG PO TABS: ORAL_TABLET | ORAL | 0 refills | Status: DC

## 2016-03-02 ENCOUNTER — Other Ambulatory Visit: Payer: Self-pay | Admitting: Family Medicine

## 2016-03-03 ENCOUNTER — Other Ambulatory Visit: Payer: Self-pay | Admitting: Family Medicine

## 2016-03-04 MED ORDER — LISINOPRIL 5 MG PO TABS: 10.0000 mg | ORAL_TABLET | Freq: Every day | ORAL | 0 refills | Status: DC

## 2016-03-04 NOTE — Telephone Encounter (Signed)
This is your patient, see med refill request 

## 2016-03-15 ENCOUNTER — Other Ambulatory Visit: Payer: Self-pay | Admitting: Family Medicine

## 2016-05-13 ENCOUNTER — Other Ambulatory Visit: Payer: Self-pay | Admitting: Student

## 2016-05-14 NOTE — Telephone Encounter (Signed)
Please have patient make an appointment to be seen in the next couple of months

## 2016-05-14 NOTE — Telephone Encounter (Signed)
Left message with woman for patient to call back and schedule an appointment.

## 2016-05-30 ENCOUNTER — Other Ambulatory Visit: Payer: Self-pay | Admitting: Internal Medicine

## 2016-06-06 ENCOUNTER — Other Ambulatory Visit: Payer: Self-pay | Admitting: Internal Medicine

## 2016-07-18 ENCOUNTER — Other Ambulatory Visit: Payer: Self-pay | Admitting: Internal Medicine

## 2016-08-29 ENCOUNTER — Other Ambulatory Visit: Payer: Self-pay | Admitting: Internal Medicine

## 2016-09-03 ENCOUNTER — Other Ambulatory Visit: Payer: Self-pay | Admitting: Internal Medicine

## 2016-09-16 ENCOUNTER — Inpatient Hospital Stay (HOSPITAL_COMMUNITY)
Admission: EM | Admit: 2016-09-16 | Discharge: 2016-09-25 | DRG: 330 | Disposition: A | Discharge status: Home Health | Payer: Medicaid Other | Attending: Family Medicine | Admitting: Family Medicine

## 2016-09-16 ENCOUNTER — Inpatient Hospital Stay (HOSPITAL_COMMUNITY): Payer: Medicaid Other

## 2016-09-16 ENCOUNTER — Encounter (HOSPITAL_COMMUNITY): Payer: Self-pay

## 2016-09-16 ENCOUNTER — Emergency Department (HOSPITAL_COMMUNITY): Payer: Medicaid Other

## 2016-09-16 DIAGNOSIS — D509 Iron deficiency anemia, unspecified: Secondary | ICD-10-CM | POA: Diagnosis present

## 2016-09-16 DIAGNOSIS — G8929 Other chronic pain: Secondary | ICD-10-CM | POA: Diagnosis present

## 2016-09-16 DIAGNOSIS — Z880 Allergy status to penicillin: Secondary | ICD-10-CM | POA: Diagnosis not present

## 2016-09-16 DIAGNOSIS — I10 Essential (primary) hypertension: Secondary | ICD-10-CM

## 2016-09-16 DIAGNOSIS — Z981 Arthrodesis status: Secondary | ICD-10-CM

## 2016-09-16 DIAGNOSIS — Z79899 Other long term (current) drug therapy: Secondary | ICD-10-CM | POA: Diagnosis not present

## 2016-09-16 DIAGNOSIS — D638 Anemia in other chronic diseases classified elsewhere: Secondary | ICD-10-CM | POA: Diagnosis not present

## 2016-09-16 DIAGNOSIS — K5900 Constipation, unspecified: Secondary | ICD-10-CM | POA: Diagnosis not present

## 2016-09-16 DIAGNOSIS — M549 Dorsalgia, unspecified: Secondary | ICD-10-CM | POA: Diagnosis present

## 2016-09-16 DIAGNOSIS — E785 Hyperlipidemia, unspecified: Secondary | ICD-10-CM | POA: Diagnosis not present

## 2016-09-16 DIAGNOSIS — E119 Type 2 diabetes mellitus without complications: Secondary | ICD-10-CM

## 2016-09-16 DIAGNOSIS — Z7984 Long term (current) use of oral hypoglycemic drugs: Secondary | ICD-10-CM | POA: Diagnosis not present

## 2016-09-16 DIAGNOSIS — K660 Peritoneal adhesions (postprocedural) (postinfection): Secondary | ICD-10-CM | POA: Diagnosis present

## 2016-09-16 DIAGNOSIS — D62 Acute posthemorrhagic anemia: Secondary | ICD-10-CM | POA: Diagnosis present

## 2016-09-16 DIAGNOSIS — C19 Malignant neoplasm of rectosigmoid junction: Principal | ICD-10-CM | POA: Diagnosis present

## 2016-09-16 DIAGNOSIS — M1990 Unspecified osteoarthritis, unspecified site: Secondary | ICD-10-CM | POA: Diagnosis present

## 2016-09-16 DIAGNOSIS — Z888 Allergy status to other drugs, medicaments and biological substances status: Secondary | ICD-10-CM | POA: Diagnosis not present

## 2016-09-16 DIAGNOSIS — Z01818 Encounter for other preprocedural examination: Secondary | ICD-10-CM

## 2016-09-16 DIAGNOSIS — K56601 Complete intestinal obstruction, unspecified as to cause: Secondary | ICD-10-CM | POA: Diagnosis present

## 2016-09-16 DIAGNOSIS — K219 Gastro-esophageal reflux disease without esophagitis: Secondary | ICD-10-CM

## 2016-09-16 DIAGNOSIS — Z885 Allergy status to narcotic agent status: Secondary | ICD-10-CM

## 2016-09-16 DIAGNOSIS — R935 Abnormal findings on diagnostic imaging of other abdominal regions, including retroperitoneum: Secondary | ICD-10-CM

## 2016-09-16 DIAGNOSIS — Z87891 Personal history of nicotine dependence: Secondary | ICD-10-CM | POA: Diagnosis not present

## 2016-09-16 DIAGNOSIS — Z8249 Family history of ischemic heart disease and other diseases of the circulatory system: Secondary | ICD-10-CM

## 2016-09-16 DIAGNOSIS — Z833 Family history of diabetes mellitus: Secondary | ICD-10-CM | POA: Diagnosis not present

## 2016-09-16 DIAGNOSIS — Z90710 Acquired absence of both cervix and uterus: Secondary | ICD-10-CM | POA: Diagnosis not present

## 2016-09-16 DIAGNOSIS — K56609 Unspecified intestinal obstruction, unspecified as to partial versus complete obstruction: Secondary | ICD-10-CM

## 2016-09-16 DIAGNOSIS — Z0189 Encounter for other specified special examinations: Secondary | ICD-10-CM

## 2016-09-16 DIAGNOSIS — D72829 Elevated white blood cell count, unspecified: Secondary | ICD-10-CM | POA: Diagnosis present

## 2016-09-16 DIAGNOSIS — K624 Stenosis of anus and rectum: Secondary | ICD-10-CM | POA: Diagnosis present

## 2016-09-16 DIAGNOSIS — R000 Tachycardia, unspecified: Secondary | ICD-10-CM | POA: Diagnosis present

## 2016-09-16 DIAGNOSIS — Z820 Family history of epilepsy and other diseases of the nervous system: Secondary | ICD-10-CM

## 2016-09-16 DIAGNOSIS — K6389 Other specified diseases of intestine: Secondary | ICD-10-CM | POA: Diagnosis present

## 2016-09-16 HISTORY — DX: Malignant neoplasm of colon, unspecified: C18.9

## 2016-09-16 LAB — CBC
HCT: 40.4 % (ref 36.0–46.0)
HEMOGLOBIN: 13.1 g/dL (ref 12.0–15.0)
MCH: 25.2 pg — AB (ref 26.0–34.0)
MCHC: 32.4 g/dL (ref 30.0–36.0)
MCV: 77.8 fL — ABNORMAL LOW (ref 78.0–100.0)
Platelets: 552 10*3/uL — ABNORMAL HIGH (ref 150–400)
RBC: 5.19 MIL/uL — AB (ref 3.87–5.11)
RDW: 14 % (ref 11.5–15.5)
WBC: 17.6 10*3/uL — ABNORMAL HIGH (ref 4.0–10.5)

## 2016-09-16 LAB — COMPREHENSIVE METABOLIC PANEL
ALBUMIN: 3.8 g/dL (ref 3.5–5.0)
ALK PHOS: 76 U/L (ref 38–126)
ALT: 17 U/L (ref 14–54)
AST: 19 U/L (ref 15–41)
Anion gap: 16 — ABNORMAL HIGH (ref 5–15)
BILIRUBIN TOTAL: 1.2 mg/dL (ref 0.3–1.2)
BUN: 21 mg/dL — ABNORMAL HIGH (ref 6–20)
CALCIUM: 9.3 mg/dL (ref 8.9–10.3)
CO2: 22 mmol/L (ref 22–32)
Chloride: 97 mmol/L — ABNORMAL LOW (ref 101–111)
Creatinine, Ser: 0.6 mg/dL (ref 0.44–1.00)
GFR calc Af Amer: >60 mL/min (ref >60)
GFR calc non Af Amer: >60 mL/min (ref >60)
Glucose, Bld: 107 mg/dL — ABNORMAL HIGH (ref 65–99)
Potassium: 3 mmol/L — ABNORMAL LOW (ref 3.5–5.1)
Sodium: 135 mmol/L (ref 135–145)
Total Protein: 7.3 g/dL (ref 6.5–8.1)

## 2016-09-16 LAB — URINALYSIS, ROUTINE W REFLEX MICROSCOPIC
Bilirubin Urine: NEGATIVE
Glucose, UA: 50 mg/dL — AB
HGB URINE DIPSTICK: NEGATIVE
Ketones, ur: NEGATIVE mg/dL
Leukocytes, UA: NEGATIVE
NITRITE: NEGATIVE
PROTEIN: NEGATIVE mg/dL
pH: 6 (ref 5.0–8.0)

## 2016-09-16 LAB — LIPASE, BLOOD: Lipase: <10 U/L — ABNORMAL LOW (ref 11–51)

## 2016-09-16 MED ORDER — SODIUM CHLORIDE 0.9 % IV SOLN: INTRAVENOUS | Status: DC

## 2016-09-16 MED ORDER — ONDANSETRON HCL 4 MG/2ML IJ SOLN
4.0000 mg | Freq: Four times a day (QID) | INTRAMUSCULAR | Status: DC | PRN
  Administered 2016-09-16: 4 mg via INTRAVENOUS
  Filled 2016-09-16: qty 2

## 2016-09-16 MED ORDER — SODIUM CHLORIDE 0.9 % IV SOLN
INTRAVENOUS | Status: DC
  Administered 2016-09-16 – 2016-09-17 (×2): via INTRAVENOUS

## 2016-09-16 MED ORDER — PEG-KCL-NACL-NASULF-NA ASC-C 100 G PO SOLR: 1.0000 | Freq: Once | ORAL | Status: DC

## 2016-09-16 MED ORDER — MORPHINE SULFATE (PF) 4 MG/ML IV SOLN
6.0000 mg | Freq: Once | INTRAVENOUS | Status: AC
  Administered 2016-09-16: 6 mg via INTRAVENOUS
  Filled 2016-09-16: qty 2

## 2016-09-16 MED ORDER — IOPAMIDOL (ISOVUE-300) INJECTION 61%
INTRAVENOUS | Status: AC
  Administered 2016-09-16: 100 mL
  Filled 2016-09-16: qty 100

## 2016-09-16 MED ORDER — FLEET ENEMA 7-19 GM/118ML RE ENEM
1.0000 | ENEMA | Freq: Once | RECTAL | Status: AC
  Administered 2016-09-16: 1 via RECTAL
  Filled 2016-09-16: qty 1

## 2016-09-16 MED ORDER — MORPHINE SULFATE (PF) 4 MG/ML IV SOLN
2.0000 mg | INTRAVENOUS | Status: DC | PRN
  Administered 2016-09-16 – 2016-09-18 (×5): 2 mg via INTRAVENOUS
  Filled 2016-09-16 (×6): qty 1

## 2016-09-16 MED ORDER — ONDANSETRON HCL 4 MG PO TABS: 4.0000 mg | ORAL_TABLET | Freq: Four times a day (QID) | ORAL | Status: DC | PRN

## 2016-09-16 MED ORDER — SODIUM CHLORIDE 0.9 % IV SOLN
1000.0000 mL | INTRAVENOUS | Status: DC
  Administered 2016-09-16: 1000 mL via INTRAVENOUS

## 2016-09-16 MED ORDER — PANTOPRAZOLE SODIUM 40 MG IV SOLR
40.0000 mg | INTRAVENOUS | Status: DC
  Administered 2016-09-16 – 2016-09-18 (×3): 40 mg via INTRAVENOUS
  Filled 2016-09-16 (×3): qty 40

## 2016-09-16 MED ORDER — FLEET ENEMA 7-19 GM/118ML RE ENEM
1.0000 | ENEMA | Freq: Once | RECTAL | Status: AC
  Administered 2016-09-17: 1 via RECTAL
  Filled 2016-09-16: qty 1

## 2016-09-16 MED ORDER — SODIUM CHLORIDE 0.9 % IV SOLN
1000.0000 mL | Freq: Once | INTRAVENOUS | Status: AC
  Administered 2016-09-16: 1000 mL via INTRAVENOUS

## 2016-09-16 MED ORDER — ONDANSETRON HCL 4 MG/2ML IJ SOLN
4.0000 mg | Freq: Once | INTRAMUSCULAR | Status: AC | PRN
  Administered 2016-09-16: 4 mg via INTRAVENOUS
  Filled 2016-09-16: qty 2

## 2016-09-16 NOTE — ED Notes (Signed)
Walked patient to the bathroom patient did well patient is back in bed resting

## 2016-09-16 NOTE — Progress Notes (Signed)
Patient admitted from E.D. To [address] 27. Oriented to room Call bell in reach. Plan for NGT and consent for endo.

## 2016-09-16 NOTE — ED Triage Notes (Signed)
Pt states she has been constipated with nausea that began on Friday. Pt states she has not eaten since last Thursday. Pt states she has used Miralax and mineral oil wihtout improvement.

## 2016-09-16 NOTE — ED Notes (Signed)
Pt. oob to the bathroom, gait steady,

## 2016-09-16 NOTE — ED Notes (Signed)
Pt. Transported to  Ct. scan

## 2016-09-16 NOTE — Consult Note (Signed)
[facility] Consult: 2:08 PM 09/16/2016  LOS: 0 days    Referring Provider: Dr Venora Maples  Primary Care Physician:  Lockie Pares, MD Primary Gastroenterologist:  unassigned     Reason for Consultation:  Sigmoid colon mass.    HPI: Wanda Baker is a 56 y.o. female.  PMH DM 2 on oral agent.  GERD, controlled with daily Nexium. Arthritis, back pain.  S/P cervical spine fusion.  S/P C-section x 2, tubal ligation an abdominal hysterectomy.    Beginning on 09/12/16 the patient developed constipation, progressive generalized abdominal pain and nausea. She never threw up. She hadn't been eating much solids but able to drink water and liquids since the symptoms started last week. Previously she had normal, formed, brown stools every day. Her weight has been stable. Because of progressive pain and lack of bowel movement despite taking mineral oil and MiraLAX, she came to the emergency room today. Patient has never had colonoscopy, mostly due to lack of adequate health insurance coverage for the procedure. No family history of colorectal cancer. Both maternal grandparents had some nonspecific troubles with her stomach but it never led to surgery. In the ED her pain has greatly improved following single, 4 mg dose of morphine and nausea is resolved following a dose of Zofran. CT scan of abdomen/pelvis with contrast reveals distended colon. Gas and stool in the distal sigmoid which appears focally narrowed with appearance worrisome for mass lesion. No metastatic disease identified. Potassium is low at 3. MCV is 77 which the same as a year ago. She is not anemic. WBCs elevated at 17 point 6.  Past Medical History:  Diagnosis Date  . Allergy    uses Nasocort daily and takes Singulair nightly   . Arthritis   . Chronic back pain     buldging disc  . Diabetes mellitus without complication (HCC)    taikes Metformin daily  . GERD (gastroesophageal reflux disease)    takes Omeprazole daily   . History of bronchitis    many yrs ago  . Hyperlipidemia    takes Atorvastatin daily  . Hypertension    takes Lisinopril daily as well HCTZ  . Weakness    left arm    Past Surgical History:  Procedure Laterality Date  . ABDOMINAL HYSTERECTOMY  2011   Supracervical Hysterectomy  . ANTERIOR CERVICAL DECOMP/DISCECTOMY FUSION N/A 12/02/2013   Procedure: ANTERIOR CERVICAL DECOMPRESSION/DISCECTOMY FUSION 3 LEVELS;  Surgeon: Erline Levine, MD;  Location: [facility];  Service: Neurosurgery;  Laterality: N/A;  C4-5 C5-6 C6-7 Anterior cervical decompression/diskectomy/fusion  . CESAREAN SECTION  86/88  . TUBAL LIGATION      Prior to Admission medications   Medication Sig Start Date End Date Taking? Authorizing Provider  acetaminophen (TYLENOL) 500 MG tablet Take 1,000 mg by mouth every 6 (six) hours as needed for mild pain or moderate pain.   Yes Historical Provider, MD  cetirizine (ZYRTEC) 10 MG tablet Take 10 mg by mouth daily.   Yes Historical Provider, MD  esomeprazole (NEXIUM) 20 MG capsule Take 20 mg  by mouth daily at 12 noon.   Yes Historical Provider, MD  hydrochlorothiazide (HYDRODIURIL) 25 MG tablet TAKE 1 TABLET BY MOUTH DAILY 08/29/16  Yes Carlyle Dolly, MD  lisinopril (PRINIVIL,ZESTRIL) 5 MG tablet TAKE 2 TABLETS BY MOUTH EVERY DAY 09/04/16  Yes Asiyah Cletis Media, MD  metFORMIN (GLUCOPHAGE) 500 MG tablet TAKE 1 TABLET BY MOUTH TWICE DAILY WITH A MEAL 07/18/16  Yes Asiyah Cletis Media, MD  atorvastatin (LIPITOR) 20 MG tablet TAKE 1 TABLET BY MOUTH EVERY DAY Patient not taking: Reported on 09/16/2016 05/26/14   Tamela Oddi Hess, DO  montelukast (SINGULAIR) 10 MG tablet TAKE 1 TABLET BY MOUTH AT BEDTIME Patient not taking: Reported on 09/16/2016    Nolon Rod, DO  Omega-3 Fatty Acids (FISH OIL CONCENTRATE) 1000 MG CAPS  Take 1,000 mg by mouth daily.     Historical Provider, MD  omeprazole (PRILOSEC) 20 MG capsule TAKE ONE CAPSULE BY MOUTH EVERY DAY Patient not taking: Reported on 09/16/2016 02/17/14   Tamela Oddi Hess, DO  triamcinolone (NASACORT AQ) 55 MCG/ACT nasal inhaler Place 2 sprays into the nose daily. Patient not taking: Reported on 09/16/2016 10/29/12   Donnamae Jude, MD    Scheduled Meds:  Infusions: . sodium chloride Stopped (09/16/16 1131)   PRN Meds:    Allergies as of 09/16/2016 - Review Complete 09/16/2016  Allergen Reaction Noted  . Prednisone    . Vicodin [hydrocodone-acetaminophen] Other (See Comments) 08/06/2010  . Penicillins Swelling and Rash 01/05/2009    Family History  Problem Relation Age of Onset  . Heart disease Maternal Grandmother   . Diabetes Maternal Grandmother   . Hypertension Maternal Grandmother   . Heart disease Maternal Grandfather   . Heart disease Paternal Grandmother   . Diabetes Paternal Grandmother   . Alzheimer's disease Paternal Grandmother     Social History   Social History  . Marital status: Divorced    Spouse name: N/A  . Number of children: N/A  . Years of education: N/A   Occupational History  . Not on file.   Social History Main Topics  . Smoking status: Former Research scientist (life sciences)  . Smokeless tobacco: Never Used     Comment: quit smoking 24yrs ago  . Alcohol use No  . Drug use: No  . Sexual activity: No   Other Topics Concern  . Not on file   Social History Narrative   lives with adult sons and mother and father   divorced   56 YO son has autism   smoked 1/2 ppd - quit 8/01    REVIEW OF SYSTEMS: Constitutional:  Generally not lacking energy. She works about 3 days a week at the [employer]. ENT:  No nose bleeds Pulm:  No difficulty breathing or cough. CV:  No palpitations, no LE edema. No chest pain GU:  No hematuria, no frequency.  No inability to urinate. GI:  Per HPI Heme:  No unusual or excessive bleeding or bruising.     Transfusions:  None Neuro:  No headaches, no peripheral tingling or numbness Derm:  No itching, no rash or sores.  Endocrine:  No sweats or chills.  No polyuria or dysuria Immunization:  Did not inquire as to recent vaccinations but her vaccinations were reviewed, she is up-to-date on Tdap. Travel:  None beyond local counties in last few months.    PHYSICAL EXAM: Vital signs in last 24 hours: Vitals:   09/16/16 1200 09/16/16 1230  BP: 120/73 123/70  Pulse: (!) 106 (!)  110  Resp: 18 (!) 22  Temp:     Wt Readings from Last 3 Encounters:  09/16/16 67.1 kg (148 lb)  09/21/15 62 kg (136 lb 11.2 oz)  07/27/14 70.8 kg (156 lb)    General: Pleasant WF who does not look ill. She is comfortable. Head:  No facial asymmetry or swelling.  Eyes:  No scleral icterus., No conjunctival pallor. Ears:  Not hard of hearing.  Nose:  No congestion or discharge. Mouth:  Good dentition. Oral mucosa is moist and clear. Tongue midline. Neck:  No JVD, no thyromegaly, no masses. Lungs:  Clear to auscultation and percussion bilaterally. No dyspnea. No cough. Heart: RRR. No MRG. S1, S2 present. Abdomen:  Soft. Not tender.  Moderately high pitched bowel sounds which are scant. Distended but not tense.  No masses, HSM, hernias..   Rectal: Soft stool in the rectal vault which is brown and 2-3+ FOBT positive.   Musc/Skeltl: No joint swelling or erythema. No gross contracture deformities. Extremities:  No CCE.  Neurologic:  Patient is alert. Oriented times 3. Moves all 4 limbs. Limb strength full. No tremor. No gross deficits. Skin:  No suspicious lesions, rashes or telangiectasia. Nodes:  No cervical adenopathy.   Psych:  Cooperative, pleasant, calm.  Intake/Output from previous day: No intake/output data recorded. Intake/Output this shift: Total I/O In: 2000 [I.V.:2000] Out: -   LAB RESULTS:  Recent Labs  09/16/16 0930  WBC 17.6*  HGB 13.1  HCT 40.4  PLT 552*   BMET Lab Results   Component Value Date   NA 135 09/16/2016   NA 138 09/21/2015   NA 142 11/24/2013   K 3.0 (L) 09/16/2016   K 3.8 09/21/2015   K 3.4 (L) 11/24/2013   CL 97 (L) 09/16/2016   CL 100 09/21/2015   CL 101 11/24/2013   CO2 22 09/16/2016   CO2 25 09/21/2015   CO2 30 11/24/2013   GLUCOSE 107 (H) 09/16/2016   GLUCOSE 81 09/21/2015   GLUCOSE 109 (H) 11/24/2013   BUN 21 (H) 09/16/2016   BUN 14 09/21/2015   BUN 14 11/24/2013   CREATININE 0.60 09/16/2016   CREATININE 0.51 09/21/2015   CREATININE 0.53 11/24/2013   CALCIUM 9.3 09/16/2016   CALCIUM 9.6 09/21/2015   CALCIUM 9.6 11/24/2013   LFT  Recent Labs  09/16/16 0930  PROT 7.3  ALBUMIN 3.8  AST 19  ALT 17  ALKPHOS 76  BILITOT 1.2   PT/INR No results found for: INR, PROTIME Hepatitis Panel No results for input(s): HEPBSAG, HCVAB, HEPAIGM, HEPBIGM in the last 72 hours. C-Diff No components found for: CDIFF Lipase     Component Value Date/Time   LIPASE <10 (L) 09/16/2016 0930    Drugs of Abuse  No results found for: LABOPIA, COCAINSCRNUR, LABBENZ, AMPHETMU, THCU, LABBARB   RADIOLOGY STUDIES: Ct Abdomen Pelvis W Contrast  Result Date: 09/16/2016 CLINICAL DATA:  Abdominal pain, constipation and nausea for 5 days. EXAM: CT ABDOMEN AND PELVIS WITH CONTRAST TECHNIQUE: Multidetector CT imaging of the abdomen and pelvis was performed using the standard protocol following bolus administration of intravenous contrast. CONTRAST:  100 ml ISOVUE-300 IOPAMIDOL (ISOVUE-300) INJECTION 61% COMPARISON:  None. FINDINGS: Lower chest: Dependent atelectasis is seen in the lung bases. No pleural or pericardial effusion. Hepatobiliary: The gallbladder is mildly distended. The liver and biliary tree appear normal. Pancreas: Unremarkable. No pancreatic ductal dilatation or surrounding inflammatory changes. Spleen: Normal in size without focal abnormality. Adrenals/Urinary Tract: Adrenal glands are unremarkable. Kidneys are  normal, without renal  calculi, focal lesion, or hydronephrosis. Bladder is unremarkable. Stomach/Bowel: There is marked distention of the colon with gas and stool to the distal sigmoid where the colon appears focally narrowed with an appearance worrisome for a mass lesion. The stomach, small bowel and appendix appear normal. Vascular/Lymphatic: Scattered aortoiliac atherosclerosis without aneurysm is identified. No lymphadenopathy. Reproductive: Status post hysterectomy. No adnexal masses. Other: No abdominal wall hernia or abnormality. No abdominopelvic ascites. Musculoskeletal: No acute abnormality. No lytic or sclerotic lesion. IMPRESSION: Findings worrisome for a mass lesion in the distal sigmoid colon causing colonic obstruction. No evidence of metastatic disease identified. Atherosclerosis. Status post hysterectomy. Electronically Signed   By: Inge Rise M.D.   On: 09/16/2016 11:08     IMPRESSION:   *  Bowel obstruction, question complete versus partial. Suspicion for mass in the sigmoid based on CT imaging. No previous colonoscopic screening.  *  Microcytosis without anemia. This was present and MCV stable compared with 09/2015.    PLAN:     *  Attempt bowel prep and colonoscopy. We will set up a colonoscopy for tomorrow and attempt split dose prep beginning tonight. Patient is aware that it may be difficult for her to tolerate the prep and that at any point, if she gets significantly nauseated or is vomiting,  she is permitted to step back and hold on drinking the prep until she feels able to better tolerate this. Thus the prep maybe a 2 day process, but just in case I do have a slot for her tomorrow.   Azucena Freed  09/16/2016, 2:08 PM Pager: [phone]   Attending physician's note   I have taken a history, examined the patient and reviewed the chart. I agree with the Advanced Practitioner's note, impression and recommendations.  56 year old female with history of diabetes admitted with worsening  abdominal distention, obstipation.  CT abdomen and pelvis showed distended colon with focal narrowing in the sigmoid colon concerning for mass lesion.   On exam abdomen is distended and tympanic, mild diffuse tenderness  Place NG tube to intermittent low suction Patient does not appear she'll be able to tolerate PO and bowel prep Nothing by mouth for now Fleet enema X 2 We will plan for flexible sigmoidoscopy to visualize the focal narrowing in sigmoid colon for diagnosis  K Denzil Magnuson, MD [phone] Mon-Fri 8a-5p [phone] after 5p, weekends, holidays

## 2016-09-16 NOTE — Progress Notes (Signed)
Attempted to advance NGT per Xray recommendation, but pt was gagging and noted NGT coiled. Pulled NGT out and pt refused NGT to be put back in. On-call Dr Juanito Doom made aware.

## 2016-09-16 NOTE — ED Provider Notes (Signed)
[facility] DEPT Provider Note   CSN: [id number] Arrival date & time: 09/16/16  0815     History   Chief Complaint Chief Complaint  Patient presents with  . Constipation    HPI Wanda Baker is a 56 y.o. female.  HPI Patient presents to the emergency department with complaints of nausea and abdominal swelling over the past several days.  No bowel movement.  No history of constipation.  No prior history of colonoscopy.  Reports she's had an abdominal hysterectomy.  No fever chills.  No vomiting.  No chest pain shortness of breath.  Symptoms are moderate to severe in severity.  She does not feel like she is passing flatus.  Past Medical History:  Diagnosis Date  . Allergy    uses Nasocort daily and takes Singulair nightly   . Arthritis   . Chronic back pain    buldging disc  . Diabetes mellitus without complication (HCC)    taikes Metformin daily  . GERD (gastroesophageal reflux disease)    takes Omeprazole daily   . History of bronchitis    many yrs ago  . Hyperlipidemia    takes Atorvastatin daily  . Hypertension    takes Lisinopril daily as well HCTZ  . Weakness    left arm    Patient Active Problem List   Diagnosis Date Noted  . Annual physical exam 09/21/2015  . Low TSH level 09/21/2015  . Tachycardia 12/16/2013  . Cervical spondylosis with radiculopathy 12/02/2013  . Cervical radiculopathy due to degenerative joint disease of spine 02/19/2013  . Hyperlipidemia with target LDL less than 100 03/24/2012  . Metabolic syndrome 35/00/9381  . Diabetes type 2, controlled ([place]) 12/03/2011  . HYPERTENSION, BENIGN ESSENTIAL 11/20/2007  . 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 GASTROESOPHAGEAL REFLUX, NO ESOPHAGITIS 08/14/2006  . BACK PAIN, LOW 08/14/2006    Past Surgical History:  Procedure Laterality Date  . ABDOMINAL HYSTERECTOMY  2011   Supracervical Hysterectomy  . ANTERIOR CERVICAL DECOMP/DISCECTOMY FUSION N/A 12/02/2013   Procedure: ANTERIOR  CERVICAL DECOMPRESSION/DISCECTOMY FUSION 3 LEVELS;  Surgeon: Erline Levine, MD;  Location: [facility];  Service: Neurosurgery;  Laterality: N/A;  C4-5 C5-6 C6-7 Anterior cervical decompression/diskectomy/fusion  . CESAREAN SECTION  86/88  . TUBAL LIGATION      OB History    No data available       Home Medications    Prior to Admission medications   Medication Sig Start Date End Date Taking? Authorizing Provider  atorvastatin (LIPITOR) 20 MG tablet TAKE 1 TABLET BY MOUTH EVERY DAY 05/26/14   Nolon Rod, DO  ciprofloxacin (CIPRO) 250 MG tablet Take 1 tablet (250 mg total) by mouth 2 (two) times daily. 07/27/14   Patrecia Pour, MD  gabapentin (NEURONTIN) 600 MG tablet Take 600 mg by mouth 3 (three) times daily.    Historical Provider, MD  hydrochlorothiazide (HYDRODIURIL) 25 MG tablet TAKE 1 TABLET BY MOUTH DAILY 08/29/16   Carlyle Dolly, MD  lisinopril (PRINIVIL,ZESTRIL) 5 MG tablet TAKE 2 TABLETS BY MOUTH EVERY DAY 09/04/16   Asiyah Cletis Media, MD  metFORMIN (GLUCOPHAGE) 500 MG tablet TAKE 1 TABLET BY MOUTH TWICE DAILY WITH A MEAL 07/18/16   Asiyah Cletis Media, MD  montelukast (SINGULAIR) 10 MG tablet TAKE 1 TABLET BY MOUTH AT BEDTIME    Bryan R Hess, DO  Omega-3 Fatty Acids (FISH OIL CONCENTRATE) 1000 MG CAPS Take by mouth.      Historical Provider, MD  omeprazole (PRILOSEC) 20  MG capsule TAKE ONE CAPSULE BY MOUTH EVERY DAY 02/17/14   Nolon Rod, DO  triamcinolone (NASACORT AQ) 55 MCG/ACT nasal inhaler Place 2 sprays into the nose daily. 10/29/12   Donnamae Jude, MD    Family History Family History  Problem Relation Age of Onset  . Heart disease Maternal Grandmother   . Diabetes Maternal Grandmother   . Hypertension Maternal Grandmother   . Heart disease Maternal Grandfather   . Heart disease Paternal Grandmother   . Diabetes Paternal Grandmother   . Alzheimer's disease Paternal Grandmother     Social History Social History  Substance Use Topics  . Smoking status:  Former Research scientist (life sciences)  . Smokeless tobacco: Never Used     Comment: quit smoking 36yrs ago  . Alcohol use No     Allergies   Penicillins; Prednisone; and Vicodin [hydrocodone-acetaminophen]   Review of Systems Review of Systems  All other systems reviewed and are negative.    Physical Exam Updated Vital Signs BP 118/73   Pulse (!) 104   Temp 98.6 F (37 C) (Oral)   Resp 18   Ht 5\' 7"  (1.702 m)   Wt 148 lb (67.1 kg)   SpO2 96%   BMI 23.18 kg/m   Physical Exam  Constitutional: She is oriented to person, place, and time. She appears well-developed and well-nourished. No distress.  HENT:  Head: Normocephalic and atraumatic.  Eyes: EOM are normal.  Neck: Normal range of motion.  Cardiovascular: Normal rate, regular rhythm and normal heart sounds.   Pulmonary/Chest: Effort normal and breath sounds normal.  Abdominal: Soft.  Mild abdominal distention and mild tenderness  Musculoskeletal: Normal range of motion.  Neurological: She is alert and oriented to person, place, and time.  Skin: Skin is warm and dry.  Psychiatric: She has a normal mood and affect. Judgment normal.  Nursing note and vitals reviewed.    ED Treatments / Results  Labs (all labs ordered are listed, but only abnormal results are displayed) Labs Reviewed  LIPASE, BLOOD - Abnormal; Notable for the following:       Result Value   Lipase <10 (*)    All other components within normal limits  COMPREHENSIVE METABOLIC PANEL - Abnormal; Notable for the following:    Potassium 3.0 (*)    Chloride 97 (*)    Glucose, Bld 107 (*)    BUN 21 (*)    Anion gap 16 (*)    All other components within normal limits  CBC - Abnormal; Notable for the following:    WBC 17.6 (*)    RBC 5.19 (*)    MCV 77.8 (*)    MCH 25.2 (*)    Platelets 552 (*)    All other components within normal limits  URINALYSIS, ROUTINE W REFLEX MICROSCOPIC - Abnormal; Notable for the following:    Specific Gravity, Urine >1.046 (*)    Glucose,  UA 50 (*)    All other components within normal limits    EKG  EKG Interpretation None       Radiology Ct Abdomen Pelvis W Contrast  Result Date: 09/16/2016 CLINICAL DATA:  Abdominal pain, constipation and nausea for 5 days. EXAM: CT ABDOMEN AND PELVIS WITH CONTRAST TECHNIQUE: Multidetector CT imaging of the abdomen and pelvis was performed using the standard protocol following bolus administration of intravenous contrast. CONTRAST:  100 ml ISOVUE-300 IOPAMIDOL (ISOVUE-300) INJECTION 61% COMPARISON:  None. FINDINGS: Lower chest: Dependent atelectasis is seen in the lung bases. No pleural or  pericardial effusion. Hepatobiliary: The gallbladder is mildly distended. The liver and biliary tree appear normal. Pancreas: Unremarkable. No pancreatic ductal dilatation or surrounding inflammatory changes. Spleen: Normal in size without focal abnormality. Adrenals/Urinary Tract: Adrenal glands are unremarkable. Kidneys are normal, without renal calculi, focal lesion, or hydronephrosis. Bladder is unremarkable. Stomach/Bowel: There is marked distention of the colon with gas and stool to the distal sigmoid where the colon appears focally narrowed with an appearance worrisome for a mass lesion. The stomach, small bowel and appendix appear normal. Vascular/Lymphatic: Scattered aortoiliac atherosclerosis without aneurysm is identified. No lymphadenopathy. Reproductive: Status post hysterectomy. No adnexal masses. Other: No abdominal wall hernia or abnormality. No abdominopelvic ascites. Musculoskeletal: No acute abnormality. No lytic or sclerotic lesion. IMPRESSION: Findings worrisome for a mass lesion in the distal sigmoid colon causing colonic obstruction. No evidence of metastatic disease identified. Atherosclerosis. Status post hysterectomy. Electronically Signed   By: Inge Rise M.D.   On: 09/16/2016 11:08    Procedures Procedures (including critical care time)  Medications Ordered in ED Medications    0.9 %  sodium chloride infusion (0 mLs Intravenous Stopped 09/16/16 1035)    Followed by  0.9 %  sodium chloride infusion (1,000 mLs Intravenous New Bag/Given 09/16/16 1035)  ondansetron (ZOFRAN) injection 4 mg (4 mg Intravenous Given 09/16/16 0940)  morphine 4 MG/ML injection 6 mg (6 mg Intravenous Given 09/16/16 1005)  iopamidol (ISOVUE-300) 61 % injection (100 mLs  Contrast Given 09/16/16 1041)     Initial Impression / Assessment and Plan / ED Course  I have reviewed the triage vital signs and the nursing notes.  Pertinent labs & imaging results that were available during my care of the patient were reviewed by me and considered in my medical decision making (see chart for details).    CT scan concerning for obstructing distal colonic mass with colonic obstruction.  Patient needed be admitted the hospital.  She will likely either need diverting colostomy versus colonic stent.  Patient be admitted to family practice service.  I spoke with the [facility] who will see the patient in consultation.  She'll likely need general surgery consultation as well.  Patient updated  Final Clinical Impressions(s) / ED Diagnoses   Final diagnoses:  Colonic obstruction    New Prescriptions New Prescriptions   No medications on file     Jola Schmidt, MD 09/16/16 1302

## 2016-09-16 NOTE — ED Notes (Signed)
Pt. Returned from CT. Pt. Is resting comfortablly.  No change in status.

## 2016-09-16 NOTE — ED Notes (Signed)
Attempted report to 6N 

## 2016-09-16 NOTE — ED Notes (Signed)
Patient is resting

## 2016-09-16 NOTE — H&P (Signed)
[facility] Admission History and Physical Service Pager: [phone]  Patient name: Wanda Baker Medical record number: [id number] Date of birth: August 15, 1960 Age: 56 y.o. Gender: female  Primary Care Provider: Lockie Pares, MD Consultants: Gastroenterology  Code Status: Full  Chief Complaint: Constipation  Assessment and Plan: Wanda Baker is a 56 y.o. female presenting with 4 days of obstipation and nausea without vomiting. PMH is significant for HTN, NIDDM, HLD, GERD, s/p x2 C-sections and abdominal hysterectomy in 2011 due to fibroids. Of note, patient has no prior history of colonoscopy. Negative family history for cancer.   #Acute Obstipation  Nausea without Vomiting: Vitals stable with exception of tachycardia in 100-110 range. Labs significant for K pf 3.0, WBC 17.6, and platelets to 552. Hgb stable at 13.1. CT abdomen/pelvis concerning for distal colonic mass. GI consulted. Onset acute per patient history over 4 days. Normal formed stools prior to sudden onset of lower abdominal pain and nausea with distention of abdomen. Has history of abdominal surgery. No h/o colonoscopy. FHx neg for colon cancer. Has history of GI bleeds. Not on anticoagulants. No history of constipation or laxative use. --Admit to inpatient on [place], attending Dr. McDiarmid -- Vitals per floor protocol --GI consulted, appreciate recommendations --Plan for 1-2 day bowel prep with anticipated colonoscopy for evaluation of colonic mass per GI --NPO with mIVF --Zofran PRN nausea --IV Protonix 40 mg QD -Morphine 2 mg q 4 PRN for pain  #Hypertension: Chronic. Normotensive on admission. On ACE-I and thiazide. --Holding lisinopril 5 mg BID --Holding HCTZ 25 mg QD  #Non-insulin-dependent diabetes mellitus: Chronic. Last A1c 5.7 on 09/2015. --Holding metformin 500 mg BID --CBG daily --A1c pending   #Hyperlipidemia: Chronic. On statin therapy. --Holding Lipitor 20 mg  QD  #GERD: Chronic. Controlled on PPI. --Initiating Protonix 40 mg IV while NPO, holding Prilosec 20 mg QD  FEN/GI: IV Protonix, NPO Prophylaxis: SCDs  Disposition: Pending improvement of complete bowel obstruction.  History of Present Illness:  Wanda Baker is a 56 y.o. female presenting with nausea and constipation. She notes that she has been constipated since Thursday or Friday. Developed diffuse abdominal pain since Friday and hasn't had anything to eat . She tried Miralax but didn't help. She notes that her appetite was down and the nausea started on Saturday. She denies any vomiting. Patient states that her abdomen started swelling gradually over the last couple days. Patient states she has not passed gas since Friday. Before her constipation, she notes that her stools are "normal" and well formed. No blood tarry stools. No melanous stools or hematochezia.   Denies any diarrhea, edema, chest pain, headaches, fever, chills.   Review Of Systems: Complete ROS performed. See HPI for pertinent.  Review of Systems  Constitutional: Negative for chills, fever, malaise/fatigue and weight loss.  HENT: Negative for congestion and sore throat.   Respiratory: Negative for shortness of breath and wheezing.   Cardiovascular: Negative for chest pain, palpitations and leg swelling.  Gastrointestinal: Positive for abdominal pain, constipation, heartburn and nausea. Negative for blood in stool, diarrhea, melena and vomiting.  Genitourinary: Negative for dysuria, flank pain, frequency, hematuria and urgency.  Musculoskeletal: Negative for back pain and myalgias.  Skin: Negative for itching and rash.  Neurological: Negative for focal weakness, weakness and headaches.    Patient Active Problem List   Diagnosis Date Noted  . Colonic obstruction 09/16/2016  . Cervical radiculopathy due to degenerative joint disease of spine 02/19/2013  . Hyperlipidemia with target  LDL less than 100 03/24/2012  .  Metabolic syndrome 94/17/4081  . Diabetes type 2, controlled ([place]) 12/03/2011  . HYPERTENSION, BENIGN ESSENTIAL 11/20/2007  . 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 GASTROESOPHAGEAL REFLUX, NO ESOPHAGITIS 08/14/2006  . BACK PAIN, LOW 08/14/2006    Past Medical History: Past Medical History:  Diagnosis Date  . Allergy    uses Nasocort daily and takes Singulair nightly   . Arthritis   . Chronic back pain    buldging disc  . Diabetes mellitus without complication (HCC)    taikes Metformin daily  . GERD (gastroesophageal reflux disease)    takes Omeprazole daily   . History of bronchitis    many yrs ago  . Hyperlipidemia    takes Atorvastatin daily  . Hypertension    takes Lisinopril daily as well HCTZ  . Weakness    left arm    Past Surgical History: Past Surgical History:  Procedure Laterality Date  . ABDOMINAL HYSTERECTOMY  2011   Supracervical Hysterectomy  . ANTERIOR CERVICAL DECOMP/DISCECTOMY FUSION N/A 12/02/2013   Procedure: ANTERIOR CERVICAL DECOMPRESSION/DISCECTOMY FUSION 3 LEVELS;  Surgeon: Erline Levine, MD;  Location: [facility];  Service: Neurosurgery;  Laterality: N/A;  C4-5 C5-6 C6-7 Anterior cervical decompression/diskectomy/fusion  . CESAREAN SECTION  86/88  . TUBAL LIGATION      Social History: Social History  Substance Use Topics  . Smoking status: Former Research scientist (life sciences)  . Smokeless tobacco: Never Used     Comment: quit smoking 59yrs ago  . Alcohol use No   Additional social history: Lives with her mother and 2 sons. She works at [employer]   Please also refer to relevant sections of EMR.  Family History: Family History  Problem Relation Age of Onset  . Heart disease Maternal Grandmother   . Diabetes Maternal Grandmother   . Hypertension Maternal Grandmother   . Heart disease Maternal Grandfather   . Heart disease Paternal Grandmother   . Diabetes Paternal Grandmother   . Alzheimer's disease Paternal Grandmother     Allergies and  Medications: Allergies  Allergen Reactions  . Prednisone     REACTION: Rash  . Vicodin [Hydrocodone-Acetaminophen] Other (See Comments)    dizziness  . Penicillins Swelling and Rash    Has patient had a PCN reaction causing immediate rash, facial/tongue/throat swelling, SOB or lightheadedness with hypotension: Yes Has patient had a PCN reaction causing severe rash involving mucus membranes or skin necrosis: No Has patient had a PCN reaction that required hospitalization No Has patient had a PCN reaction occurring within the last 10 years: No If all of the above answers are "NO", then may proceed with Cephalosporin use.    No current facility-administered medications on file prior to encounter.    Current Outpatient Prescriptions on File Prior to Encounter  Medication Sig Dispense Refill  . hydrochlorothiazide (HYDRODIURIL) 25 MG tablet TAKE 1 TABLET BY MOUTH DAILY 90 tablet 0  . lisinopril (PRINIVIL,ZESTRIL) 5 MG tablet TAKE 2 TABLETS BY MOUTH EVERY DAY 180 tablet 0  . metFORMIN (GLUCOPHAGE) 500 MG tablet TAKE 1 TABLET BY MOUTH TWICE DAILY WITH A MEAL 60 tablet 0  . atorvastatin (LIPITOR) 20 MG tablet TAKE 1 TABLET BY MOUTH EVERY DAY (Patient not taking: Reported on 09/16/2016) 90 tablet 3  . montelukast (SINGULAIR) 10 MG tablet TAKE 1 TABLET BY MOUTH AT BEDTIME (Patient not taking: Reported on 09/16/2016) 30 tablet 11  . Omega-3 Fatty Acids (FISH OIL CONCENTRATE) 1000 MG CAPS Take 1,000 mg by mouth  daily.     . omeprazole (PRILOSEC) 20 MG capsule TAKE ONE CAPSULE BY MOUTH EVERY DAY (Patient not taking: Reported on 09/16/2016) 30 capsule 5  . triamcinolone (NASACORT AQ) 55 MCG/ACT nasal inhaler Place 2 sprays into the nose daily. (Patient not taking: Reported on 09/16/2016) 1 Inhaler 12    Objective: BP 123/70   Pulse (!) 110   Temp 98.6 F (37 C) (Oral)   Resp (!) 22   Ht 5\' 7"  (1.702 m)   Wt 148 lb (67.1 kg)   SpO2 96%   BMI 23.18 kg/m  Exam: General: well nourished, well  developed, in no acute distress with non-toxic appearance HEENT: normocephalic, atraumatic, moist mucous membranes Neck: supple, non-tender without lymphadenopathy CV: regular rate and rhythm without murmurs, rubs, or gallops Lungs: clear to auscultation bilaterally with normal work of breathing Abdomen: moderately distended, tense on palpation, diffuse mild tenderness more localized at lower quadrants of abdomen, no masses or organomegaly palpable due to distention, absent bowel sounds appreciated, tympanic throughout abdomen Skin: warm, dry, no rashes or lesions, cap refill < 2 seconds Extremities: warm and well perfused, normal tone, nonedematous  Labs and Imaging: CBC BMET   Recent Labs Lab 09/16/16 0930  WBC 17.6*  HGB 13.1  HCT 40.4  PLT 552*    Recent Labs Lab 09/16/16 0930  NA 135  K 3.0*  CL 97*  CO2 22  BUN 21*  CREATININE 0.60  GLUCOSE 107*  CALCIUM 9.3     Urinalysis    Component Value Date/Time   COLORURINE YELLOW 09/16/2016 1009   APPEARANCEUR CLEAR 09/16/2016 1009   LABSPEC >1.046 (H) 09/16/2016 1009   PHURINE 6.0 09/16/2016 1009   GLUCOSEU 50 (A) 09/16/2016 1009   HGBUR NEGATIVE 09/16/2016 1009   HGBUR small 04/28/2009 1049   BILIRUBINUR NEGATIVE 09/16/2016 1009   BILIRUBINUR NEG 09/21/2015 1502   KETONESUR NEGATIVE 09/16/2016 1009   PROTEINUR NEGATIVE 09/16/2016 1009   UROBILINOGEN 0.2 09/21/2015 1502   UROBILINOGEN 0.2 04/28/2009 1049   NITRITE NEGATIVE 09/16/2016 1009   LEUKOCYTESUR NEGATIVE 09/16/2016 1009   Lipase: <10  CT Abdomen Pelvis W Contrast (09/16/2016) IMPRESSION: Findings worrisome for a mass lesion in the distal sigmoid colon causing colonic obstruction. No evidence of metastatic disease identified. Atherosclerosis. Status post hysterectomy.  [place] Bing, DO 09/16/2016, 2:37 PM PGY-1, [facility] Intern pager: [phone], text pages welcome  I have separately seen and examined the patient. I have  discussed the findings and exam with Dr. Yisroel Ramming and agree with the above note.  My changes/additions are outlined in BLUE.   Smitty Cords, MD [place], PGY-2

## 2016-09-17 ENCOUNTER — Inpatient Hospital Stay (HOSPITAL_COMMUNITY): Payer: Medicaid Other

## 2016-09-17 ENCOUNTER — Encounter (HOSPITAL_COMMUNITY): Payer: Self-pay | Admitting: *Deleted

## 2016-09-17 ENCOUNTER — Inpatient Hospital Stay (HOSPITAL_COMMUNITY): Payer: Medicaid Other | Admitting: Anesthesiology

## 2016-09-17 ENCOUNTER — Other Ambulatory Visit: Payer: Self-pay

## 2016-09-17 ENCOUNTER — Encounter (HOSPITAL_COMMUNITY)
Admission: EM | Disposition: A | Discharge status: Home Health | Payer: Self-pay | Source: Home / Self Care | Attending: Family Medicine

## 2016-09-17 DIAGNOSIS — C189 Malignant neoplasm of colon, unspecified: Secondary | ICD-10-CM

## 2016-09-17 DIAGNOSIS — K639 Disease of intestine, unspecified: Secondary | ICD-10-CM

## 2016-09-17 DIAGNOSIS — K6389 Other specified diseases of intestine: Secondary | ICD-10-CM | POA: Diagnosis present

## 2016-09-17 HISTORY — PX: FLEXIBLE SIGMOIDOSCOPY: SHX5431

## 2016-09-17 LAB — CBC
HCT: 34.9 % — ABNORMAL LOW (ref 36.0–46.0)
Hemoglobin: 10.9 g/dL — ABNORMAL LOW (ref 12.0–15.0)
MCH: 24.8 pg — AB (ref 26.0–34.0)
MCHC: 31.2 g/dL (ref 30.0–36.0)
MCV: 79.3 fL (ref 78.0–100.0)
PLATELETS: 398 10*3/uL (ref 150–400)
RBC: 4.4 MIL/uL (ref 3.87–5.11)
RDW: 14 % (ref 11.5–15.5)
WBC: 12.7 10*3/uL — AB (ref 4.0–10.5)

## 2016-09-17 LAB — BASIC METABOLIC PANEL
Anion gap: 12 (ref 5–15)
BUN: 15 mg/dL (ref 6–20)
CALCIUM: 8.7 mg/dL — AB (ref 8.9–10.3)
CHLORIDE: 103 mmol/L (ref 101–111)
CO2: 24 mmol/L (ref 22–32)
CREATININE: 0.49 mg/dL (ref 0.44–1.00)
GFR calc Af Amer: >60 mL/min (ref >60)
GFR calc non Af Amer: >60 mL/min (ref >60)
Glucose, Bld: 88 mg/dL (ref 65–99)
Potassium: 3.1 mmol/L — ABNORMAL LOW (ref 3.5–5.1)
SODIUM: 139 mmol/L (ref 135–145)

## 2016-09-17 LAB — HIV ANTIBODY (ROUTINE TESTING W REFLEX): HIV Screen 4th Generation wRfx: NONREACTIVE

## 2016-09-17 SURGERY — SIGMOIDOSCOPY, FLEXIBLE
Anesthesia: Monitor Anesthesia Care

## 2016-09-17 MED ORDER — LIDOCAINE HCL (CARDIAC) 20 MG/ML IV SOLN
INTRAVENOUS | Status: DC | PRN
  Administered 2016-09-17: 60 mg via INTRATRACHEAL

## 2016-09-17 MED ORDER — CHLORHEXIDINE GLUCONATE CLOTH 2 % EX PADS
6.0000 | MEDICATED_PAD | Freq: Once | CUTANEOUS | Status: AC
  Administered 2016-09-18: 6 via TOPICAL

## 2016-09-17 MED ORDER — PROPOFOL 10 MG/ML IV BOLUS
INTRAVENOUS | Status: DC | PRN
  Administered 2016-09-17 (×2): 20 mg via INTRAVENOUS

## 2016-09-17 MED ORDER — SODIUM CHLORIDE 0.9 % IV SOLN
30.0000 meq | Freq: Once | INTRAVENOUS | Status: AC
  Administered 2016-09-17: 30 meq via INTRAVENOUS
  Filled 2016-09-17: qty 15

## 2016-09-17 MED ORDER — CHLORHEXIDINE GLUCONATE CLOTH 2 % EX PADS
6.0000 | MEDICATED_PAD | Freq: Once | CUTANEOUS | Status: AC
  Administered 2016-09-17: 6 via TOPICAL

## 2016-09-17 MED ORDER — PROPOFOL 500 MG/50ML IV EMUL
INTRAVENOUS | Status: DC | PRN
  Administered 2016-09-17: 75 ug/kg/min via INTRAVENOUS

## 2016-09-17 MED ORDER — SODIUM CHLORIDE 0.9 % IV SOLN
INTRAVENOUS | Status: DC
  Administered 2016-09-17: 17:00:00 via INTRAVENOUS
  Filled 2016-09-17 (×4): qty 1000

## 2016-09-17 MED ORDER — CEFOTETAN DISODIUM 2 G IJ SOLR
2.0000 g | INTRAMUSCULAR | Status: AC
  Administered 2016-09-18: 2 g via INTRAVENOUS
  Filled 2016-09-17: qty 2

## 2016-09-17 NOTE — Anesthesia Procedure Notes (Signed)
Procedure Name: MAC Date/Time: 09/17/2016 11:55 AM Performed by: Salli Quarry Eiliana Drone Pre-anesthesia Checklist: Patient identified, Emergency Drugs available, Suction available and Patient being monitored Patient Re-evaluated:Patient Re-evaluated prior to inductionOxygen Delivery Method: Simple face mask

## 2016-09-17 NOTE — Op Note (Signed)
[facility] Patient Name: Wanda Baker Procedure Date : 09/17/2016 MRN: [id number] Attending MD: Mauri Pole , MD Date of Birth: May 16, 1961 CSN: [id number] Age: 56 Admit Type: Inpatient Procedure:                Flexible Sigmoidoscopy Indications:              Generalized abdominal pain, Abnormal CT of the GI                            tract, Suspected colorectal cancer Providers:                Mauri Pole, MD, Carmie End, RN, Cherylynn Ridges, Technician, Gala Lewandowsky, CRNA Referring MD:              Medicines:                Monitored Anesthesia Care Complications:            No immediate complications. Estimated Blood Loss:     Estimated blood loss was minimal. Procedure:                Pre-Anesthesia Assessment:                           - Prior to the procedure, a History and Physical                            was performed, and patient medications and                            allergies were reviewed. The patient's tolerance of                            previous anesthesia was also reviewed. The risks                            and benefits of the procedure and the sedation                            options and risks were discussed with the patient.                            All questions were answered, and informed consent                            was obtained. Prior Anticoagulants: The patient has                            taken no previous anticoagulant or antiplatelet                            agents. ASA Grade Assessment: III - A patient with  severe systemic disease. After reviewing the risks                            and benefits, the patient was deemed in                            satisfactory condition to undergo the procedure.                           After obtaining informed consent, the scope was                            passed under direct vision. The [id number]  ([id number])                            scope was introduced through the anus and advanced                            to the the rectosigmoid junction. After obtaining                            informed consent, the scope was passed under direct                            vision.The flexible sigmoidoscopy was somewhat                            difficult due to inadequate bowel prep. Scope In: 11:57:06 AM Scope Out: 12:06:26 PM Total Procedure Duration: 0 hours 9 minutes 20 seconds  Findings:      The perianal and digital rectal examinations were normal.      A frond-like/villous completely obstructing large mass was found in the       recto-sigmoid colon at 15cm, scope couldnt be advanced further. The mass       was circumferential. Friable lesion with oozing . Biopsies were taken       with a cold forceps for histology. 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 Biopsied. Recommendation:           - NPO.                           - Await pathology results.                           - NG tube                           - Consult surgery Procedure Code(s):        --- Professional ---                           [phone], Sigmoidoscopy, flexible; with biopsy,  single                            or multiple Diagnosis Code(s):        --- Professional ---                           D49.0, Neoplasm of unspecified behavior of                            digestive system                           R10.84, Generalized abdominal pain                           R93.3, Abnormal findings on diagnostic imaging of                            other parts of digestive tract CPT copyright 2016 [employer]. All rights reserved. The codes documented in this report are preliminary and upon coder review may  be revised to meet current compliance requirements. Mauri Pole, MD 09/17/2016 12:14:16 PM This report  has been signed electronically. Number of Addenda: 0

## 2016-09-17 NOTE — H&P (View-Only) (Signed)
[facility] Consult: 2:08 PM 09/16/2016  LOS: 0 days    Referring Provider: Dr Venora Maples  Primary Care Physician:  Lockie Pares, MD Primary Gastroenterologist:  unassigned     Reason for Consultation:  Sigmoid colon mass.    HPI: Wanda Baker is a 56 y.o. female.  PMH DM 2 on oral agent.  GERD, controlled with daily Nexium. Arthritis, back pain.  S/P cervical spine fusion.  S/P C-section x 2, tubal ligation an abdominal hysterectomy.    Beginning on 09/12/16 the patient developed constipation, progressive generalized abdominal pain and nausea. She never threw up. She hadn't been eating much solids but able to drink water and liquids since the symptoms started last week. Previously she had normal, formed, brown stools every day. Her weight has been stable. Because of progressive pain and lack of bowel movement despite taking mineral oil and MiraLAX, she came to the emergency room today. Patient has never had colonoscopy, mostly due to lack of adequate health insurance coverage for the procedure. No family history of colorectal cancer. Both maternal grandparents had some nonspecific troubles with her stomach but it never led to surgery. In the ED her pain has greatly improved following single, 4 mg dose of morphine and nausea is resolved following a dose of Zofran. CT scan of abdomen/pelvis with contrast reveals distended colon. Gas and stool in the distal sigmoid which appears focally narrowed with appearance worrisome for mass lesion. No metastatic disease identified. Potassium is low at 3. MCV is 77 which the same as a year ago. She is not anemic. WBCs elevated at 17 point 6.  Past Medical History:  Diagnosis Date  . Allergy    uses Nasocort daily and takes Singulair nightly   . Arthritis   . Chronic back pain     buldging disc  . Diabetes mellitus without complication (HCC)    taikes Metformin daily  . GERD (gastroesophageal reflux disease)    takes Omeprazole daily   . History of bronchitis    many yrs ago  . Hyperlipidemia    takes Atorvastatin daily  . Hypertension    takes Lisinopril daily as well HCTZ  . Weakness    left arm    Past Surgical History:  Procedure Laterality Date  . ABDOMINAL HYSTERECTOMY  2011   Supracervical Hysterectomy  . ANTERIOR CERVICAL DECOMP/DISCECTOMY FUSION N/A 12/02/2013   Procedure: ANTERIOR CERVICAL DECOMPRESSION/DISCECTOMY FUSION 3 LEVELS;  Surgeon: Erline Levine, MD;  Location: [facility];  Service: Neurosurgery;  Laterality: N/A;  C4-5 C5-6 C6-7 Anterior cervical decompression/diskectomy/fusion  . CESAREAN SECTION  86/88  . TUBAL LIGATION      Prior to Admission medications   Medication Sig Start Date End Date Taking? Authorizing Provider  acetaminophen (TYLENOL) 500 MG tablet Take 1,000 mg by mouth every 6 (six) hours as needed for mild pain or moderate pain.   Yes Historical Provider, MD  cetirizine (ZYRTEC) 10 MG tablet Take 10 mg by mouth daily.   Yes Historical Provider, MD  esomeprazole (NEXIUM) 20 MG capsule Take 20 mg  by mouth daily at 12 noon.   Yes Historical Provider, MD  hydrochlorothiazide (HYDRODIURIL) 25 MG tablet TAKE 1 TABLET BY MOUTH DAILY 08/29/16  Yes Carlyle Dolly, MD  lisinopril (PRINIVIL,ZESTRIL) 5 MG tablet TAKE 2 TABLETS BY MOUTH EVERY DAY 09/04/16  Yes Asiyah Cletis Media, MD  metFORMIN (GLUCOPHAGE) 500 MG tablet TAKE 1 TABLET BY MOUTH TWICE DAILY WITH A MEAL 07/18/16  Yes Asiyah Cletis Media, MD  atorvastatin (LIPITOR) 20 MG tablet TAKE 1 TABLET BY MOUTH EVERY DAY Patient not taking: Reported on 09/16/2016 05/26/14   Tamela Oddi Hess, DO  montelukast (SINGULAIR) 10 MG tablet TAKE 1 TABLET BY MOUTH AT BEDTIME Patient not taking: Reported on 09/16/2016    Nolon Rod, DO  Omega-3 Fatty Acids (FISH OIL CONCENTRATE) 1000 MG CAPS  Take 1,000 mg by mouth daily.     Historical Provider, MD  omeprazole (PRILOSEC) 20 MG capsule TAKE ONE CAPSULE BY MOUTH EVERY DAY Patient not taking: Reported on 09/16/2016 02/17/14   Tamela Oddi Hess, DO  triamcinolone (NASACORT AQ) 55 MCG/ACT nasal inhaler Place 2 sprays into the nose daily. Patient not taking: Reported on 09/16/2016 10/29/12   Donnamae Jude, MD    Scheduled Meds:  Infusions: . sodium chloride Stopped (09/16/16 1131)   PRN Meds:    Allergies as of 09/16/2016 - Review Complete 09/16/2016  Allergen Reaction Noted  . Prednisone    . Vicodin [hydrocodone-acetaminophen] Other (See Comments) 08/06/2010  . Penicillins Swelling and Rash 01/05/2009    Family History  Problem Relation Age of Onset  . Heart disease Maternal Grandmother   . Diabetes Maternal Grandmother   . Hypertension Maternal Grandmother   . Heart disease Maternal Grandfather   . Heart disease Paternal Grandmother   . Diabetes Paternal Grandmother   . Alzheimer's disease Paternal Grandmother     Social History   Social History  . Marital status: Divorced    Spouse name: N/A  . Number of children: N/A  . Years of education: N/A   Occupational History  . Not on file.   Social History Main Topics  . Smoking status: Former Research scientist (life sciences)  . Smokeless tobacco: Never Used     Comment: quit smoking 75yrs ago  . Alcohol use No  . Drug use: No  . Sexual activity: No   Other Topics Concern  . Not on file   Social History Narrative   lives with adult sons and mother and father   divorced   33 YO son has autism   smoked 1/2 ppd - quit 8/01    REVIEW OF SYSTEMS: Constitutional:  Generally not lacking energy. She works about 3 days a week at the [employer]. ENT:  No nose bleeds Pulm:  No difficulty breathing or cough. CV:  No palpitations, no LE edema. No chest pain GU:  No hematuria, no frequency.  No inability to urinate. GI:  Per HPI Heme:  No unusual or excessive bleeding or bruising.     Transfusions:  None Neuro:  No headaches, no peripheral tingling or numbness Derm:  No itching, no rash or sores.  Endocrine:  No sweats or chills.  No polyuria or dysuria Immunization:  Did not inquire as to recent vaccinations but her vaccinations were reviewed, she is up-to-date on Tdap. Travel:  None beyond local counties in last few months.    PHYSICAL EXAM: Vital signs in last 24 hours: Vitals:   09/16/16 1200 09/16/16 1230  BP: 120/73 123/70  Pulse: (!) 106 (!)  110  Resp: 18 (!) 22  Temp:     Wt Readings from Last 3 Encounters:  09/16/16 67.1 kg (148 lb)  09/21/15 62 kg (136 lb 11.2 oz)  07/27/14 70.8 kg (156 lb)    General: Pleasant WF who does not look ill. She is comfortable. Head:  No facial asymmetry or swelling.  Eyes:  No scleral icterus., No conjunctival pallor. Ears:  Not hard of hearing.  Nose:  No congestion or discharge. Mouth:  Good dentition. Oral mucosa is moist and clear. Tongue midline. Neck:  No JVD, no thyromegaly, no masses. Lungs:  Clear to auscultation and percussion bilaterally. No dyspnea. No cough. Heart: RRR. No MRG. S1, S2 present. Abdomen:  Soft. Not tender.  Moderately high pitched bowel sounds which are scant. Distended but not tense.  No masses, HSM, hernias..   Rectal: Soft stool in the rectal vault which is brown and 2-3+ FOBT positive.   Musc/Skeltl: No joint swelling or erythema. No gross contracture deformities. Extremities:  No CCE.  Neurologic:  Patient is alert. Oriented times 3. Moves all 4 limbs. Limb strength full. No tremor. No gross deficits. Skin:  No suspicious lesions, rashes or telangiectasia. Nodes:  No cervical adenopathy.   Psych:  Cooperative, pleasant, calm.  Intake/Output from previous day: No intake/output data recorded. Intake/Output this shift: Total I/O In: 2000 [I.V.:2000] Out: -   LAB RESULTS:  Recent Labs  09/16/16 0930  WBC 17.6*  HGB 13.1  HCT 40.4  PLT 552*   BMET Lab Results   Component Value Date   NA 135 09/16/2016   NA 138 09/21/2015   NA 142 11/24/2013   K 3.0 (L) 09/16/2016   K 3.8 09/21/2015   K 3.4 (L) 11/24/2013   CL 97 (L) 09/16/2016   CL 100 09/21/2015   CL 101 11/24/2013   CO2 22 09/16/2016   CO2 25 09/21/2015   CO2 30 11/24/2013   GLUCOSE 107 (H) 09/16/2016   GLUCOSE 81 09/21/2015   GLUCOSE 109 (H) 11/24/2013   BUN 21 (H) 09/16/2016   BUN 14 09/21/2015   BUN 14 11/24/2013   CREATININE 0.60 09/16/2016   CREATININE 0.51 09/21/2015   CREATININE 0.53 11/24/2013   CALCIUM 9.3 09/16/2016   CALCIUM 9.6 09/21/2015   CALCIUM 9.6 11/24/2013   LFT  Recent Labs  09/16/16 0930  PROT 7.3  ALBUMIN 3.8  AST 19  ALT 17  ALKPHOS 76  BILITOT 1.2   PT/INR No results found for: INR, PROTIME Hepatitis Panel No results for input(s): HEPBSAG, HCVAB, HEPAIGM, HEPBIGM in the last 72 hours. C-Diff No components found for: CDIFF Lipase     Component Value Date/Time   LIPASE <10 (L) 09/16/2016 0930    Drugs of Abuse  No results found for: LABOPIA, COCAINSCRNUR, LABBENZ, AMPHETMU, THCU, LABBARB   RADIOLOGY STUDIES: Ct Abdomen Pelvis W Contrast  Result Date: 09/16/2016 CLINICAL DATA:  Abdominal pain, constipation and nausea for 5 days. EXAM: CT ABDOMEN AND PELVIS WITH CONTRAST TECHNIQUE: Multidetector CT imaging of the abdomen and pelvis was performed using the standard protocol following bolus administration of intravenous contrast. CONTRAST:  100 ml ISOVUE-300 IOPAMIDOL (ISOVUE-300) INJECTION 61% COMPARISON:  None. FINDINGS: Lower chest: Dependent atelectasis is seen in the lung bases. No pleural or pericardial effusion. Hepatobiliary: The gallbladder is mildly distended. The liver and biliary tree appear normal. Pancreas: Unremarkable. No pancreatic ductal dilatation or surrounding inflammatory changes. Spleen: Normal in size without focal abnormality. Adrenals/Urinary Tract: Adrenal glands are unremarkable. Kidneys are  normal, without renal  calculi, focal lesion, or hydronephrosis. Bladder is unremarkable. Stomach/Bowel: There is marked distention of the colon with gas and stool to the distal sigmoid where the colon appears focally narrowed with an appearance worrisome for a mass lesion. The stomach, small bowel and appendix appear normal. Vascular/Lymphatic: Scattered aortoiliac atherosclerosis without aneurysm is identified. No lymphadenopathy. Reproductive: Status post hysterectomy. No adnexal masses. Other: No abdominal wall hernia or abnormality. No abdominopelvic ascites. Musculoskeletal: No acute abnormality. No lytic or sclerotic lesion. IMPRESSION: Findings worrisome for a mass lesion in the distal sigmoid colon causing colonic obstruction. No evidence of metastatic disease identified. Atherosclerosis. Status post hysterectomy. Electronically Signed   By: Inge Rise M.D.   On: 09/16/2016 11:08     IMPRESSION:   *  Bowel obstruction, question complete versus partial. Suspicion for mass in the sigmoid based on CT imaging. No previous colonoscopic screening.  *  Microcytosis without anemia. This was present and MCV stable compared with 09/2015.    PLAN:     *  Attempt bowel prep and colonoscopy. We will set up a colonoscopy for tomorrow and attempt split dose prep beginning tonight. Patient is aware that it may be difficult for her to tolerate the prep and that at any point, if she gets significantly nauseated or is vomiting,  she is permitted to step back and hold on drinking the prep until she feels able to better tolerate this. Thus the prep maybe a 2 day process, but just in case I do have a slot for her tomorrow.   Azucena Freed  09/16/2016, 2:08 PM Pager: [phone]   Attending physician's note   I have taken a history, examined the patient and reviewed the chart. I agree with the Advanced Practitioner's note, impression and recommendations.  56 year old female with history of diabetes admitted with worsening  abdominal distention, obstipation.  CT abdomen and pelvis showed distended colon with focal narrowing in the sigmoid colon concerning for mass lesion.   On exam abdomen is distended and tympanic, mild diffuse tenderness  Place NG tube to intermittent low suction Patient does not appear she'll be able to tolerate PO and bowel prep Nothing by mouth for now Fleet enema X 2 We will plan for flexible sigmoidoscopy to visualize the focal narrowing in sigmoid colon for diagnosis  K Denzil Magnuson, MD [phone] Mon-Fri 8a-5p [phone] after 5p, weekends, holidays

## 2016-09-17 NOTE — Discharge Summary (Signed)
[facility] Discharge Summary  Patient name: Wanda Baker Medical record number: [id number] Date of birth: 12/15/1960 Age: 56 y.o. Gender: female Date of Admission: 09/16/2016  Date of Discharge: 09/25/2016 Admitting Physician: Blane Ohara McDiarmid, MD  Primary Care Provider: Lockie Pares, MD Consultants: Gastroenterology, General Surgery  Indication for Hospitalization: Obstipation  Discharge Diagnoses/Problem List:  Invasive adenocarcinoma Diverging colostomy Acute blood loss anemia Iron deficiency anemia Anemia of chronic disease Hypertension Non-insulin-dependent diabetes mellitus Hyperlipidemia GERD  Disposition: Home  Discharge Condition: Stable, imporved  Discharge Exam:  Please refer to exam from progress note from day of discharge  Brief Hospital Course:  Wanda Baker a 56 y.o.femalepresenting with 4 days of obstipation and nausea without vomiting. PMH is significant for HTN, NIDDM, HLD, GERD, s/p x2 C-sections and abdominal hysterectomy in 2011 due to fibroids.  She [facility] ED with complaints of obstipation and nausea without vomiting. Patient denied history of blood in stool, or family history of colon cancer. Patient endorse no prior screening colonoscopy. CT of abdomen/pelvis revealed concerning mass in distal colon. GI performed sigmoidoscopy which confirmed mass suspicious for malignancy. General surgery was consulted and removed a 15 cm rectosigmoid mass on 09/18/2016. A temporary diverging colostomy was placed with good output. Margins were clean with 0/20 lymph node involvement. Patient was monitored for 7 days and diet was advanced to regular prior to discharge. Surgery and oncology follow-up was scheduled prior to discharge.  Issues for Follow Up:  1. Patient is to follow-up with general surgery 4/26 with Dr. Dalbert Batman concerning colon resection with diverging colostomy. Patient was discharged with 1 month supply probiotic.  Please evaluate colostomy bag at hospital follow-up. 2. 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 Negative margins confirmed with 0/20 lymph node involvement after removal. Patient to follow-up with oncology 4/17 with Dr. Benay Spice. Of note, patient is under the impression she does not have cancer. This was discussed over the phone with patient's brother who understand situation and cares for patient daily. 3. Patient reluctant to opioid use. Discharged on tramadol 100 mg every 6 hours, #30 tabs, 0 refills. Please assess pain control. 4. Patient's hemoglobin with downtrend during admission, however stabilized at 8 prior to discharge. Anemia panel consistent with mixed IDA and ACD. Patient was discharged on iron supplement with 1 month supply. Please recheck CBC. 5. PT recommended home health however not covered by Medicaid. Patient given DME for rolling walker 5 inch and 3 and 1. Please assess ability to ambulate and ADLs. 6. Patient to restart metforminafter discharge. HCTZ and Lisinopril were held due to low normal BP.  7. Patient instructed to restart Protonix and Lipitor.  Significant Procedures:  Low anterior colon resection with diverting colostomy (09/18/2016)  Significant Labs and Imaging:   Recent Labs Lab 09/23/16 0525 09/24/16 0453 09/25/16 0611  WBC 8.8 9.2 10.4  HGB 8.3* 7.8* 8.0*  HCT 25.1* 24.2* 25.2*  PLT 357 373 389    Recent Labs Lab 09/20/16 0251 09/20/16 0850 09/21/16 0506 09/23/16 0525 09/24/16 0453 09/25/16 0611  NA 137  --  137 138 139 139  K 3.3*  --  4.2 4.2 3.8 3.4*  CL 102  --  99* 104 104 102  CO2 24  --  25 26 28 28   GLUCOSE 82  --  80 100* 91 101*  BUN 5*  --  <5* <5* <5* <5*  CREATININE 0.53  --  0.42* 0.35* 0.40* 0.36*  CALCIUM  8.0*  --  8.6* 8.5* 8.5* 8.6*  MG  --  1.7  --   --   --   --   ALKPHOS  --   --   --  42 44  --   AST  --   --   --  12* 21  --   ALT  --   --   --  13* 19  --    ALBUMIN  --   --   --  1.9* 1.8*  --    Urinalysis    Component Value Date/Time   COLORURINE YELLOW 09/16/2016 1009   APPEARANCEUR CLEAR 09/16/2016 1009   LABSPEC >1.046 (H) 09/16/2016 1009   PHURINE 6.0 09/16/2016 1009   GLUCOSEU 50 (A) 09/16/2016 1009   HGBUR NEGATIVE 09/16/2016 1009   HGBUR small 04/28/2009 1049   BILIRUBINUR NEGATIVE 09/16/2016 1009   BILIRUBINUR NEG 09/21/2015 1502   KETONESUR NEGATIVE 09/16/2016 1009   PROTEINUR NEGATIVE 09/16/2016 1009   UROBILINOGEN 0.2 09/21/2015 1502   UROBILINOGEN 0.2 04/28/2009 1049   NITRITE NEGATIVE 09/16/2016 1009   LEUKOCYTESUR NEGATIVE 09/16/2016 1009   Haptoglobin 361 (H) Lactic dehydrogenase 142 (WNL) Iron 9 (L) TIBC 249 (L) Ferritin 40 (WNL) Folate 12.3 (WNL) B12 669 (WNL) Retic 1.5 (WNL) Lipase: <10 (WNL) HIV: Nonreactive A1c: 5.7 (WNL)  CEA: 8.5 (H) Mag: 1.7 (WNL)  FLEXIBLE SIGMOIDOSCOPY (09/17/2016) FINDINGS: A frond-like/villous completely obstructing large mass was found in the recto-sigmoid colon at 15cm, scope couldnt be advanced further. The mass was circumferential. Friable lesion with oozing . Biopsies were taken with a cold forceps for histology.  IMPRESSION: Likely malignant completely obstructing tumor in the recto-sigmoid colon. Biopsied.  CT Abdomen Pelvis W Contrast (09/16/2016) FINDINGS: Lower chest: Dependent atelectasis is seen in the lung bases. No pleural or pericardial effusion. Hepatobiliary: The gallbladder is mildly distended. The liver and biliary tree appear normal. Pancreas: Unremarkable. No pancreatic ductal dilatation or surrounding inflammatory changes. Spleen: Normal in size without focal abnormality. Adrenals/Urinary Tract: Adrenal glands are unremarkable. Kidneys are normal, without renal calculi, focal lesion, or hydronephrosis. Bladder is unremarkable. Stomach/Bowel: There is marked distention of the colon with gas and stool to the distal sigmoid where the colon appears focally  narrowed with an appearance worrisome for a mass lesion. The stomach, small bowel and appendix appear normal. Vascular/Lymphatic: Scattered aortoiliac atherosclerosis without aneurysm is identified. No lymphadenopathy. Reproductive: Status post hysterectomy. No adnexal masses. Other: No abdominal wall hernia or abnormality. No abdominopelvic ascites. Musculoskeletal: No acute abnormality. No lytic or sclerotic lesion.  IMPRESSION: Findings worrisome for a mass lesion in the distal sigmoid colon causing colonic obstruction. No evidence of metastatic disease identified. Atherosclerosis. Status post hysterectomy.  DG Chest 2 View (09/17/2016) FINDINGS: Postsurgical changes of the lower cervical spine. Mildly diminished lung volumes. Mild bibasilar atelectasis. Borderline cardiomegaly, augmented by low lung volume. No pneumothorax. Dilated bowel in the upper abdomen.  IMPRESSION: 1. Low lung volumes with bibasilar atelectasis 2. Borderline cardiomegaly, exaggerated by low inspiration.  DG Abd Portable 1V (09/16/2016) FINDINGS: Nasogastric tube is identified distal tip probably at the GE junction. Advancement is recommended. Air-filled distended colon is identified.  IMPRESSION: Nasogastric tube is identified distal tip probably at the GE junction, advancement is recommended. Air-filled distended colon is identified.  Results/Tests Pending at Time of Discharge: None  Discharge Medications:  Allergies as of 09/25/2016      Reactions   Prednisone    REACTION: Rash   Vicodin [hydrocodone-acetaminophen] Other (See Comments)   dizziness  Penicillins Swelling, Rash   Has patient had a PCN reaction causing immediate rash, facial/tongue/throat swelling, SOB or lightheadedness with hypotension: Yes Has patient had a PCN reaction causing severe rash involving mucus membranes or skin necrosis: No Has patient had a PCN reaction that required hospitalization No Has patient had a PCN reaction  occurring within the last 10 years: No If all of the above answers are "NO", then may proceed with Cephalosporin use.      Medication List    STOP taking these medications   acetaminophen 500 MG tablet Commonly known as:  TYLENOL   esomeprazole 20 MG capsule Commonly known as:  NEXIUM   hydrochlorothiazide 25 MG tablet Commonly known as:  HYDRODIURIL   lisinopril 5 MG tablet Commonly known as:  PRINIVIL,ZESTRIL     TAKE these medications   atorvastatin 20 MG tablet Commonly known as:  LIPITOR TAKE 1 TABLET BY MOUTH EVERY DAY   cetirizine 10 MG tablet Commonly known as:  ZYRTEC Take 10 mg by mouth daily.   ferrous sulfate 325 (65 FE) MG tablet Take 1 tablet (325 mg total) by mouth 3 (three) times daily with meals.   FISH OIL CONCENTRATE 1000 MG Caps Take 1,000 mg by mouth daily.   metFORMIN 500 MG tablet Commonly known as:  GLUCOPHAGE TAKE 1 TABLET BY MOUTH TWICE DAILY WITH A MEAL   montelukast 10 MG tablet Commonly known as:  SINGULAIR TAKE 1 TABLET BY MOUTH AT BEDTIME   omeprazole 20 MG capsule Commonly known as:  PRILOSEC TAKE ONE CAPSULE BY MOUTH EVERY DAY   saccharomyces boulardii 250 MG capsule Commonly known as:  FLORASTOR Take 1 capsule (250 mg total) by mouth 2 (two) times daily.   traMADol 50 MG tablet Commonly known as:  ULTRAM Take 2 tablets (100 mg total) by mouth every 6 (six) hours as needed for moderate pain.   triamcinolone 55 MCG/ACT nasal inhaler Commonly known as:  NASACORT AQ Place 2 sprays into the nose daily.            Durable Medical Equipment        Start     Ordered   09/25/16 [phone]  For home use only DME Walker rolling  Once    Question:  Patient needs a walker to treat with the following condition  Answer:  Post-op pain   09/25/16 0842   09/23/16 1150  For home use only DME 3 n 1  Once     09/23/16 1149   09/22/16 1735  For home use only DME Walker rolling  Once    Question Answer Comment  Patient needs a walker to  treat with the following condition Abdominal weakness   Patient needs a walker to treat with the following condition Weakness      09/22/16 1735      Discharge Instructions: Please refer to Patient Instructions section of EMR for full details.  Patient was counseled important signs and symptoms that should prompt return to medical care, changes in medications, dietary instructions, activity restrictions, and follow up appointments.   Follow-Up Appointments: Follow-up Information    Lockie Pares, MD. Go on 09/27/2016.   Specialty:  Family Medicine Why:  Go to hospital follow-up appointment at 3:45 PM. Please arrive 30 minutes early. Contact information: [place] [address] [phone]        [name], MD. Go on 10/01/2016.   Specialty:  Oncology Why:  Go to oncology follow-up at 2:00 PM.  Please arrive 30 minutes early. Contact information: [place] [address] [phone]        [name], MD Follow up on 10/10/2016.   Specialty:  General Surgery Why:  Your appointment is at 9:15 AM.  Be at the office 30 minutes early for check in.   Contact information: [address] [place] [address] [phone]           Smiley Houseman, MD 09/25/2016, 12:32 PM PGY-1, [place]

## 2016-09-17 NOTE — Progress Notes (Signed)
Family Medicine Teaching Service Daily Progress Note Intern Pager: [phone]  Patient name: Wanda Baker Medical record number: [id number] Date of birth: 1961/02/18 Age: 56 y.o. Gender: female  Primary Care Provider: Lockie Pares, MD Consultants: Gastroenterology  Code Status: Full  Pt Overview and Major Events to Date:  04/02: Admit for complete colonic obstruction concerning for mass, GI consulted 04/03: Fleet enema with successful passage of watery brown stool  Assessment and Plan: Wanda Baker is a 56 y.o. female presenting with 4 days of obstipation and nausea without vomiting. PMH is significant for HTN, NIDDM, HLD, GERD, s/p x2 C-sections and abdominal hysterectomy in 2011 due to fibroids. Of note, patient has no prior history of colonoscopy. Negative family history for cancer.   #Acute Obstipation  Nausea without Vomiting: CT abdomen/pelvis concerning for distal colonic mass. GI following. Onset acute per patient history over 4 days. Normal formed stools prior to sudden onset of lower abdominal pain and nausea with distention of abdomen. Has history of abdominal surgery. No h/o colonoscopy. FHx neg for colon cancer. Has history of GI bleeds. Not on anticoagulants. No history of constipation or laxative use. --GI consulted, appreciate recommendations --Plan for Fleet enema and sigmoidoscopy per GI --Patient refusing NG, NPO with MIVF --Zofran PRN nausea --IV Protonix 40 mg QD --Morphine 2 mg q4h PRN for pain  #Tachycardia  Hypertension: Normotensive. Remains tachycardic in the low 100s since admission. Afebrile without tachypnea. Has leukocytosis now improving thought to be reactive to obstruction. Anemic at 10.9 as of 4/3. Blood appreciated per rectum during admission. On ACE-I and thiazide for BP control.  --Monitor Hgb with transfusion threshold at 7 --Holding lisinopril 5 mg BID and HCTZ 25 mg QD  #Non-insulin-dependent diabetes mellitus: Chronic. Last A1c 5.7 on  09/2015. --Holding metformin 500 mg BID --CBG daily --A1c pending   #Hyperlipidemia: Chronic. On statin therapy. --Holding Lipitor 20 mg QD  #GERD: Chronic. Controlled on PPI. --Initiating Protonix 40 mg IV while NPO, holding Prilosec 20 mg QD  FEN/GI: IV Protonix, NPO Prophylaxis: SCDs  Disposition: Pending improvement of complete bowel obstruction.  Subjective:  Pain has improved in lower abdomen after passing a bowel movement. Continues to be nauseous without vomiting. Asking for pain medication inserting abdomen pain.  Objective: Temp:  [98.3 F (36.8 C)-98.6 F (37 C)] 98.6 F (37 C) (04/03 0533) Pulse Rate:  [103-127] 105 (04/03 0533) Resp:  [13-25] 18 (04/03 0533) BP: (109-139)/(54-85) 109/54 (04/03 0533) SpO2:  [95 %-96 %] 96 % (04/03 0533) Weight:  [148 lb (67.1 kg)-148 lb 5.9 oz (67.3 kg)] 148 lb 5.9 oz (67.3 kg) (04/02 1840) Physical Exam: General: well nourished, well developed, in no acute distress with non-toxic appearance HEENT: normocephalic, atraumatic, moist mucous membranes Neck: supple, non-tender without lymphadenopathy CV: regular rate and rhythm without murmurs, rubs, or gallops Lungs: bibasilar crackles, no wheezing with normal work of breathing Abdomen: moderately distended, tense on palpation, mild tenderness at lower quadrants of abdomen, no masses or organomegaly palpable due to distention, hypoactive bowel sounds appreciated, tympanic throughout abdomen Skin: warm, dry, no rashes or lesions, cap refill < 2 seconds Extremities: warm and well perfused, normal tone, non-edematous  Laboratory:  Recent Labs Lab 09/16/16 0930 09/17/16 0328  WBC 17.6* 12.7*  HGB 13.1 10.9*  HCT 40.4 34.9*  PLT 552* 398    Recent Labs Lab 09/16/16 0930 09/17/16 0328  NA 135 139  K 3.0* 3.1*  CL 97* 103  CO2 22 24  BUN 21* 15  CREATININE  0.60 0.49  CALCIUM 9.3 8.7*  PROT 7.3  --   BILITOT 1.2  --   ALKPHOS 76  --   ALT 17  --   AST 19  --    GLUCOSE 107* 88   Lipase: <10 HIV: Pending A1c: pending  Imaging/Diagnostic Tests: CT Abdomen Pelvis W Contrast (09/16/2016) FINDINGS: Lower chest: Dependent atelectasis is seen in the lung bases. No pleural or pericardial effusion. Hepatobiliary: The gallbladder is mildly distended. The liver and biliary tree appear normal. Pancreas: Unremarkable. No pancreatic ductal dilatation or surrounding inflammatory changes. Spleen: Normal in size without focal abnormality. Adrenals/Urinary Tract: Adrenal glands are unremarkable. Kidneys are normal, without renal calculi, focal lesion, or hydronephrosis. Bladder is unremarkable. Stomach/Bowel: There is marked distention of the colon with gas and stool to the distal sigmoid where the colon appears focally narrowed with an appearance worrisome for a mass lesion. The stomach, small bowel and appendix appear normal. Vascular/Lymphatic: Scattered aortoiliac atherosclerosis without aneurysm is identified. No lymphadenopathy. Reproductive: Status post hysterectomy. No adnexal masses. Other: No abdominal wall hernia or abnormality. No abdominopelvic ascites. Musculoskeletal: No acute abnormality. No lytic or sclerotic lesion.  IMPRESSION: Findings worrisome for a mass lesion in the distal sigmoid colon causing colonic obstruction. No evidence of metastatic disease identified. Atherosclerosis. Status post hysterectomy.  DG Abd Portable 1V (09/16/2016) FINDINGS: Nasogastric tube is identified distal tip probably at the GE junction. Advancement is recommended. Air-filled distended colon is identified.  IMPRESSION: Nasogastric tube is identified distal tip probably at the GE junction, advancement is recommended. Air-filled distended colon is identified.  [place] Bing, DO 09/17/2016, 6:56 AM PGY-1, [place] Intern pager: [phone], text pages welcome

## 2016-09-17 NOTE — Interval H&P Note (Signed)
History and Physical Interval Note:  09/17/2016 11:42 AM  Wanda Baker  has presented today for surgery, with the diagnosis of Evaluate for sigmoid colon mass with associated obstruction.  The various methods of treatment have been discussed with the patient and family. After consideration of risks, benefits and other options for treatment, the patient has consented to  Procedure(s): COLONOSCOPY WITH PROPOFOL (N/A) as a surgical intervention .  The patient's history has been reviewed, patient examined, no change in status, stable for surgery.  I have reviewed the patient's chart and labs.  Questions were answered to the patient's satisfaction.     Homero Hyson

## 2016-09-17 NOTE — Consult Note (Signed)
[facility] Consult Note  Wanda Baker 07/20/1960  [id number].    Requesting MD: McMullen Chief Complaint/Reason for Consult: Colon mass  HPI:  Wanda Baker is a 55yo female admitted to [facility] 4/2 with 4 days of obstipation and nausea. States that her pain began last Saturday and has gradually gotten worse. Last BM was Friday 3/30. Pain is diffuse about the abdomen. She reports nausea and abdominal distension. Denies emesis, fever, chills. Denies any previous melena or hematochezia. 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 Biopsy was taken and results are pending. General surgery consulted for evaluation/definitive management of obstruction.  PMH significant for HTN, NIDDM, HLD, GERD Abdominal surgical history: c section x2, abdominal hysterectomy 2011 No prior colonoscopy before admission No known FH of colon cancer Anticoagulants: none Nonsmoker Employment: [employer]  ROS: Review of Systems  Constitutional: Negative.   HENT: Negative.   Eyes: Negative.   Respiratory: Negative.   Cardiovascular: Negative.   Gastrointestinal: Positive for abdominal pain, constipation and nausea. Negative for blood in stool, diarrhea, heartburn, melena and vomiting.  Genitourinary: Negative.   Musculoskeletal: Positive for back pain.  Skin: Negative.   Neurological: Negative.   All systems reviewed and otherwise negative except for as above  Family History  Problem Relation Age of Onset  . Heart disease Maternal Grandmother   . Diabetes Maternal Grandmother   . Hypertension Maternal Grandmother   . Heart disease Maternal Grandfather   . Heart disease Paternal Grandmother   . Diabetes Paternal Grandmother   . Alzheimer's disease Paternal Grandmother     Past Medical History:  Diagnosis Date  . Allergy    uses Nasocort daily and takes Singulair nightly   . Arthritis    . Chronic back pain    buldging disc  . Diabetes mellitus without complication (HCC)    taikes Metformin daily  . GERD (gastroesophageal reflux disease)    takes Omeprazole daily   . History of bronchitis    many yrs ago  . Hyperlipidemia    takes Atorvastatin daily  . Hypertension    takes Lisinopril daily as well HCTZ  . Weakness    left arm    Past Surgical History:  Procedure Laterality Date  . ABDOMINAL HYSTERECTOMY  2011   Supracervical Hysterectomy  . ANTERIOR CERVICAL DECOMP/DISCECTOMY FUSION N/A 12/02/2013   Procedure: ANTERIOR CERVICAL DECOMPRESSION/DISCECTOMY FUSION 3 LEVELS;  Surgeon: Joseph Stern, MD;  Location: [facility];  Service: Neurosurgery;  Laterality: N/A;  C4-5 C5-6 C6-7 Anterior cervical decompression/diskectomy/fusion  . CESAREAN SECTION  86/88  . TUBAL LIGATION      Social History:  reports that she has quit smoking. She has never used smokeless tobacco. She reports that she does not drink alcohol or use drugs.  Allergies:  Allergies  Allergen Reactions  . Prednisone     REACTION: Rash  . Vicodin [Hydrocodone-Acetaminophen] Other (See Comments)    dizziness  . Penicillins Swelling and Rash    Has patient had a PCN reaction causing immediate rash, facial/tongue/throat swelling, SOB or lightheadedness with hypotension: Yes Has patient had a PCN reaction causing severe rash involving mucus membranes or skin necrosis: No Has patient had a PCN reaction that required hospitalization No Has patient had a PCN reaction occurring within the last 10 years: No If all of the above answers are "NO", then may proceed with Cephalosporin use.     Medications Prior to Admission    Medication Sig Dispense Refill  . acetaminophen (TYLENOL) 500 MG tablet Take 1,000 mg by mouth every 6 (six) hours as needed for mild pain or moderate pain.    . cetirizine (ZYRTEC) 10 MG tablet Take 10 mg by mouth daily.    [name] Kitchen esomeprazole (NEXIUM) 20 MG capsule Take 20 mg by mouth  daily at 12 noon.    . hydrochlorothiazide (HYDRODIURIL) 25 MG tablet TAKE 1 TABLET BY MOUTH DAILY 90 tablet 0  . lisinopril (PRINIVIL,ZESTRIL) 5 MG tablet TAKE 2 TABLETS BY MOUTH EVERY DAY 180 tablet 0  . metFORMIN (GLUCOPHAGE) 500 MG tablet TAKE 1 TABLET BY MOUTH TWICE DAILY WITH A MEAL 60 tablet 0  . atorvastatin (LIPITOR) 20 MG tablet TAKE 1 TABLET BY MOUTH EVERY DAY (Patient not taking: Reported on 09/16/2016) 90 tablet 3  . montelukast (SINGULAIR) 10 MG tablet TAKE 1 TABLET BY MOUTH AT BEDTIME (Patient not taking: Reported on 09/16/2016) 30 tablet 11  . Omega-3 Fatty Acids (FISH OIL CONCENTRATE) 1000 MG CAPS Take 1,000 mg by mouth daily.     [name] Kitchen omeprazole (PRILOSEC) 20 MG capsule TAKE ONE CAPSULE BY MOUTH EVERY DAY (Patient not taking: Reported on 09/16/2016) 30 capsule 5  . triamcinolone (NASACORT AQ) 55 MCG/ACT nasal inhaler Place 2 sprays into the nose daily. (Patient not taking: Reported on 09/16/2016) 1 Inhaler 12    Prior to Admission medications   Medication Sig Start Date End Date Taking? Authorizing Provider  acetaminophen (TYLENOL) 500 MG tablet Take 1,000 mg by mouth every 6 (six) hours as needed for mild pain or moderate pain.   Yes Historical Provider, MD  cetirizine (ZYRTEC) 10 MG tablet Take 10 mg by mouth daily.   Yes Historical Provider, MD  esomeprazole (NEXIUM) 20 MG capsule Take 20 mg by mouth daily at 12 noon.   Yes Historical Provider, MD  hydrochlorothiazide (HYDRODIURIL) 25 MG tablet TAKE 1 TABLET BY MOUTH DAILY 08/29/16  Yes Carlyle Dolly, MD  lisinopril (PRINIVIL,ZESTRIL) 5 MG tablet TAKE 2 TABLETS BY MOUTH EVERY DAY 09/04/16  Yes Asiyah Cletis Media, MD  metFORMIN (GLUCOPHAGE) 500 MG tablet TAKE 1 TABLET BY MOUTH TWICE DAILY WITH A MEAL 07/18/16  Yes Asiyah Cletis Media, MD  atorvastatin (LIPITOR) 20 MG tablet TAKE 1 TABLET BY MOUTH EVERY DAY Patient not taking: Reported on 09/16/2016 05/26/14   Tamela Oddi Hess, DO  montelukast (SINGULAIR) 10 MG tablet TAKE 1 TABLET BY MOUTH  AT BEDTIME Patient not taking: Reported on 09/16/2016    Nolon Rod, DO  Omega-3 Fatty Acids (FISH OIL CONCENTRATE) 1000 MG CAPS Take 1,000 mg by mouth daily.     Historical Provider, MD  omeprazole (PRILOSEC) 20 MG capsule TAKE ONE CAPSULE BY MOUTH EVERY DAY Patient not taking: Reported on 09/16/2016 02/17/14   Tamela Oddi Hess, DO  triamcinolone (NASACORT AQ) 55 MCG/ACT nasal inhaler Place 2 sprays into the nose daily. Patient not taking: Reported on 09/16/2016 10/29/12   Donnamae Jude, MD    Blood pressure 139/72, pulse 98, temperature 98.3 F (36.8 C), temperature source Oral, resp. rate (!) 24, height 5' 7" (1.702 m), weight 148 lb 5.9 oz (67.3 kg), SpO2 96 %. Physical Exam: General: cooperative, WD/WN white female who is laying in bed in NAD HEENT: head is normocephalic, atraumatic.  Sclera are noninjected.  Ears and nose without any masses or lesions.  Mouth is pink and moist. Dentition fair. Heart: regular, rate, and rhythm.  No obvious murmurs, gallops, or rubs noted.  Palpable pedal  pulses bilaterally Lungs: CTAB, no wheezes, rhonchi, or rales noted.  Respiratory effort nonlabored Abd: soft, distended, +BS, no masses, hernias, or organomegaly. No TTP. MS: all 4 extremities are symmetrical with no cyanosis, clubbing, or edema. Skin: warm and dry with no masses, lesions, or rashes Psych: A&Ox3 with an appropriate affect. Neuro: CM 2-12 intact, extremity CSM intact bilaterally, normal speech  Results for orders placed or performed during the hospital encounter of 09/16/16 (from the past 48 hour(s))  Lipase, blood     Status: Abnormal   Collection Time: 09/16/16  9:30 AM  Result Value Ref Range   Lipase <10 (L) 11 - 51 U/L  Comprehensive metabolic panel     Status: Abnormal   Collection Time: 09/16/16  9:30 AM  Result Value Ref Range   Sodium 135 135 - 145 mmol/L   Potassium 3.0 (L) 3.5 - 5.1 mmol/L   Chloride 97 (L) 101 - 111 mmol/L   CO2 22 22 - 32 mmol/L   Glucose, Bld 107 (H) 65 - 99  mg/dL   BUN 21 (H) 6 - 20 mg/dL   Creatinine, Ser 0.60 0.44 - 1.00 mg/dL   Calcium 9.3 8.9 - 10.3 mg/dL   Total Protein 7.3 6.5 - 8.1 g/dL   Albumin 3.8 3.5 - 5.0 g/dL   AST 19 15 - 41 U/L   ALT 17 14 - 54 U/L   Alkaline Phosphatase 76 38 - 126 U/L   Total Bilirubin 1.2 0.3 - 1.2 mg/dL   GFR calc non Af Amer >60 >60 mL/min   GFR calc Af Amer >60 >60 mL/min    Comment: (NOTE) The eGFR has been calculated using the CKD EPI equation. This calculation has not been validated in all clinical situations. eGFR's persistently <60 mL/min signify possible Chronic Kidney Disease.    Anion gap 16 (H) 5 - 15  CBC     Status: Abnormal   Collection Time: 09/16/16  9:30 AM  Result Value Ref Range   WBC 17.6 (H) 4.0 - 10.5 K/uL   RBC 5.19 (H) 3.87 - 5.11 MIL/uL   Hemoglobin 13.1 12.0 - 15.0 g/dL   HCT 40.4 36.0 - 46.0 %   MCV 77.8 (L) 78.0 - 100.0 fL   MCH 25.2 (L) 26.0 - 34.0 pg   MCHC 32.4 30.0 - 36.0 g/dL   RDW 14.0 11.5 - 15.5 %   Platelets 552 (H) 150 - 400 K/uL  Urinalysis, Routine w reflex microscopic     Status: Abnormal   Collection Time: 09/16/16 10:09 AM  Result Value Ref Range   Color, Urine YELLOW YELLOW   APPearance CLEAR CLEAR   Specific Gravity, Urine >1.046 (H) 1.005 - 1.030   pH 6.0 5.0 - 8.0   Glucose, UA 50 (A) NEGATIVE mg/dL   Hgb urine dipstick NEGATIVE NEGATIVE   Bilirubin Urine NEGATIVE NEGATIVE   Ketones, ur NEGATIVE NEGATIVE mg/dL   Protein, ur NEGATIVE NEGATIVE mg/dL   Nitrite NEGATIVE NEGATIVE   Leukocytes, UA NEGATIVE NEGATIVE  Basic metabolic panel     Status: Abnormal   Collection Time: 09/17/16  3:28 AM  Result Value Ref Range   Sodium 139 135 - 145 mmol/L   Potassium 3.1 (L) 3.5 - 5.1 mmol/L   Chloride 103 101 - 111 mmol/L   CO2 24 22 - 32 mmol/L   Glucose, Bld 88 65 - 99 mg/dL   BUN 15 6 - 20 mg/dL   Creatinine, Ser 0.49 0.44 - 1.00 mg/dL  Calcium 8.7 (L) 8.9 - 10.3 mg/dL   GFR calc non Af Amer >60 >60 mL/min   GFR calc Af Amer >60 >60 mL/min     Comment: (NOTE) The eGFR has been calculated using the CKD EPI equation. This calculation has not been validated in all clinical situations. eGFR's persistently <60 mL/min signify possible Chronic Kidney Disease.    Anion gap 12 5 - 15  CBC     Status: Abnormal   Collection Time: 09/17/16  3:28 AM  Result Value Ref Range   WBC 12.7 (H) 4.0 - 10.5 K/uL   RBC 4.40 3.87 - 5.11 MIL/uL   Hemoglobin 10.9 (L) 12.0 - 15.0 g/dL   HCT 34.9 (L) 36.0 - 46.0 %   MCV 79.3 78.0 - 100.0 fL   MCH 24.8 (L) 26.0 - 34.0 pg   MCHC 31.2 30.0 - 36.0 g/dL   RDW 14.0 11.5 - 15.5 %   Platelets 398 150 - 400 K/uL   Ct Abdomen Pelvis W Contrast  Result Date: 09/16/2016 CLINICAL DATA:  Abdominal pain, constipation and nausea for 5 days. EXAM: CT ABDOMEN AND PELVIS WITH CONTRAST TECHNIQUE: Multidetector CT imaging of the abdomen and pelvis was performed using the standard protocol following bolus administration of intravenous contrast. CONTRAST:  100 ml ISOVUE-300 IOPAMIDOL (ISOVUE-300) INJECTION 61% COMPARISON:  None. FINDINGS: Lower chest: Dependent atelectasis is seen in the lung bases. No pleural or pericardial effusion. Hepatobiliary: The gallbladder is mildly distended. The liver and biliary tree appear normal. Pancreas: Unremarkable. No pancreatic ductal dilatation or surrounding inflammatory changes. Spleen: Normal in size without focal abnormality. Adrenals/Urinary Tract: Adrenal glands are unremarkable. Kidneys are normal, without renal calculi, focal lesion, or hydronephrosis. Bladder is unremarkable. Stomach/Bowel: There is marked distention of the colon with gas and stool to the distal sigmoid where the colon appears focally narrowed with an appearance worrisome for a mass lesion. The stomach, small bowel and appendix appear normal. Vascular/Lymphatic: Scattered aortoiliac atherosclerosis without aneurysm is identified. No lymphadenopathy. Reproductive: Status post hysterectomy. No adnexal masses. Other: No  abdominal wall hernia or abnormality. No abdominopelvic ascites. Musculoskeletal: No acute abnormality. No lytic or sclerotic lesion. IMPRESSION: Findings worrisome for a mass lesion in the distal sigmoid colon causing colonic obstruction. No evidence of metastatic disease identified. Atherosclerosis. Status post hysterectomy. Electronically Signed   By: Inge Rise M.D.   On: 09/16/2016 11:08   Dg Abd Portable 1v  Result Date: 09/16/2016 CLINICAL DATA:  Nasogastric tube placement. EXAM: PORTABLE ABDOMEN - 1 VIEW COMPARISON:  CT abdomen pelvis September 16, 2016 FINDINGS: Nasogastric tube is identified distal tip probably at the GE junction. Advancement is recommended. Air-filled distended colon is identified. IMPRESSION: Nasogastric tube is identified distal tip probably at the GE junction, advancement is recommended. Air-filled distended colon is identified. Electronically Signed   By: Abelardo Diesel M.D.   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 I will discuss surgical timing with MD. Recommend continuing NPO for now as she is fully obstructed. Follow biopsy results. Will order CEA.  BROOKE A  Sabra Heck, [facility] 09/17/2016, 3:30 PM Pager: [phone] Consults: [phone] Mon-Fri 7:00 am-4:30 pm Sat-Sun 7:00 am-11:30 am

## 2016-09-17 NOTE — Transfer of Care (Signed)
Immediate Anesthesia Transfer of Care Note  Patient: Wanda Baker  Procedure(s) Performed: Procedure(s): COLONOSCOPY WITH PROPOFOL (N/A)  Patient Location: Endoscopy Unit  Anesthesia Type:MAC  Level of Consciousness: awake, alert  and patient cooperative  Airway & Oxygen Therapy: Patient Spontanous Breathing and Patient connected to face mask oxygen  Post-op Assessment: Report given to RN and Post -op Vital signs reviewed and stable  Post vital signs: Reviewed and stable  Last Vitals:  Vitals:   09/17/16 0533 09/17/16 0953  BP: (!) 109/54 125/61  Pulse: (!) 105 (!) 105  Resp: 18 15  Temp: 37 C 36.8 C    Last Pain:  Vitals:   09/17/16 0953  TempSrc: Oral  PainSc: 6       Patients Stated Pain Goal: 2 (94/50/38 8828)  Complications: No apparent anesthesia complications

## 2016-09-17 NOTE — Anesthesia Preprocedure Evaluation (Addendum)
Anesthesia Evaluation  Patient identified by MRN, date of birth, ID band Patient awake    Reviewed: Allergy & Precautions, NPO status , Patient's Chart, lab work & pertinent test results  Airway Mallampati: II  TM Distance: >3 FB Neck ROM: Full    Dental no notable dental hx. (+) Teeth Intact   Pulmonary former smoker,    Pulmonary exam normal breath sounds clear to auscultation       Cardiovascular hypertension, Pt. on medications Normal cardiovascular exam Rhythm:Regular Rate:Normal     Neuro/Psych negative neurological ROS  negative psych ROS   GI/Hepatic Neg liver ROS, GERD  Medicated and Controlled,  Endo/Other  diabetes, Type 2, Oral Hypoglycemic Agents  Renal/GU negative Renal ROS  negative genitourinary   Musculoskeletal negative musculoskeletal ROS (+)   Abdominal   Peds negative pediatric ROS (+)  Hematology negative hematology ROS (+)   Anesthesia Other Findings   Reproductive/Obstetrics negative OB ROS                           Anesthesia Physical Anesthesia Plan  ASA: II  Anesthesia Plan: MAC   Post-op Pain Management:    Induction:   Airway Management Planned: Simple Face Mask  Additional Equipment:   Intra-op Plan:   Post-operative Plan:   Informed Consent: I have reviewed the patients History and Physical, chart, labs and discussed the procedure including the risks, benefits and alternatives for the proposed anesthesia with the patient or authorized representative who has indicated his/her understanding and acceptance.   Dental advisory given  Plan Discussed with: CRNA  Anesthesia Plan Comments:         Anesthesia Quick Evaluation

## 2016-09-18 ENCOUNTER — Inpatient Hospital Stay (HOSPITAL_COMMUNITY): Payer: Medicaid Other | Admitting: Anesthesiology

## 2016-09-18 ENCOUNTER — Encounter (HOSPITAL_COMMUNITY): Payer: Self-pay | Admitting: *Deleted

## 2016-09-18 ENCOUNTER — Encounter (HOSPITAL_COMMUNITY)
Admission: EM | Disposition: A | Discharge status: Home Health | Payer: Self-pay | Source: Home / Self Care | Attending: Family Medicine

## 2016-09-18 DIAGNOSIS — K624 Stenosis of anus and rectum: Secondary | ICD-10-CM

## 2016-09-18 DIAGNOSIS — E785 Hyperlipidemia, unspecified: Secondary | ICD-10-CM

## 2016-09-18 HISTORY — PX: BOWEL RESECTION: SHX1257

## 2016-09-18 HISTORY — DX: Stenosis of anus and rectum: K62.4

## 2016-09-18 LAB — BASIC METABOLIC PANEL
Anion gap: 10 (ref 5–15)
Anion gap: 10 (ref 5–15)
BUN: 11 mg/dL (ref 6–20)
BUN: 9 mg/dL (ref 6–20)
CALCIUM: 8.6 mg/dL — AB (ref 8.9–10.3)
CHLORIDE: 109 mmol/L (ref 101–111)
CO2: 21 mmol/L — AB (ref 22–32)
CO2: 20 mmol/L — ABNORMAL LOW (ref 22–32)
CREATININE: 0.44 mg/dL (ref 0.44–1.00)
CREATININE: 0.58 mg/dL (ref 0.44–1.00)
Calcium: 8.2 mg/dL — ABNORMAL LOW (ref 8.9–10.3)
Chloride: 110 mmol/L (ref 101–111)
GFR calc non Af Amer: >60 mL/min (ref >60)
GFR calc non Af Amer: >60 mL/min (ref >60)
GLUCOSE: 78 mg/dL (ref 65–99)
Glucose, Bld: 105 mg/dL — ABNORMAL HIGH (ref 65–99)
Potassium: 4.1 mmol/L (ref 3.5–5.1)
Potassium: 4.1 mmol/L (ref 3.5–5.1)
SODIUM: 139 mmol/L (ref 135–145)
Sodium: 141 mmol/L (ref 135–145)

## 2016-09-18 LAB — CBC
HCT: 33.5 % — ABNORMAL LOW (ref 36.0–46.0)
HEMATOCRIT: 34.3 % — AB (ref 36.0–46.0)
Hemoglobin: 10.7 g/dL — ABNORMAL LOW (ref 12.0–15.0)
Hemoglobin: 10.5 g/dL — ABNORMAL LOW (ref 12.0–15.0)
MCH: 25.2 pg — AB (ref 26.0–34.0)
MCH: 25.3 pg — AB (ref 26.0–34.0)
MCHC: 31.2 g/dL (ref 30.0–36.0)
MCHC: 31.3 g/dL (ref 30.0–36.0)
MCV: 80.7 fL (ref 78.0–100.0)
MCV: 80.7 fL (ref 78.0–100.0)
PLATELETS: 435 10*3/uL — AB (ref 150–400)
Platelets: 428 10*3/uL — ABNORMAL HIGH (ref 150–400)
RBC: 4.25 MIL/uL (ref 3.87–5.11)
RBC: 4.15 MIL/uL (ref 3.87–5.11)
RDW: 14.4 % (ref 11.5–15.5)
RDW: 14.4 % (ref 11.5–15.5)
WBC: 12.6 10*3/uL — ABNORMAL HIGH (ref 4.0–10.5)
WBC: 15.7 10*3/uL — ABNORMAL HIGH (ref 4.0–10.5)

## 2016-09-18 LAB — HEMOGLOBIN A1C
Hgb A1c MFr Bld: 5.7 % — ABNORMAL HIGH (ref 4.8–5.6)
Mean Plasma Glucose: 117 mg/dL

## 2016-09-18 LAB — GLUCOSE, CAPILLARY
Glucose-Capillary: 72 mg/dL (ref 65–99)
Glucose-Capillary: 112 mg/dL — ABNORMAL HIGH (ref 65–99)

## 2016-09-18 LAB — CEA: CEA: 8.5 ng/mL — AB (ref 0.0–4.7)

## 2016-09-18 LAB — SURGICAL PCR SCREEN
MRSA, PCR: NEGATIVE
STAPHYLOCOCCUS AUREUS: NEGATIVE

## 2016-09-18 SURGERY — RESECTION, RECTUM, LOW ANTERIOR
Anesthesia: General | Site: Abdomen

## 2016-09-18 MED ORDER — ROCURONIUM BROMIDE 50 MG/5ML IV SOSY
PREFILLED_SYRINGE | INTRAVENOUS | Status: AC
  Filled 2016-09-18: qty 5

## 2016-09-18 MED ORDER — FENTANYL CITRATE (PF) 250 MCG/5ML IJ SOLN
INTRAMUSCULAR | Status: AC
  Filled 2016-09-18: qty 5

## 2016-09-18 MED ORDER — MORPHINE SULFATE (PF) 2 MG/ML IV SOLN
1.0000 mg | INTRAVENOUS | Status: DC | PRN
  Administered 2016-09-18 – 2016-09-19 (×4): 2 mg via INTRAVENOUS
  Filled 2016-09-18 (×4): qty 1

## 2016-09-18 MED ORDER — DIPHENHYDRAMINE HCL 50 MG/ML IJ SOLN
INTRAMUSCULAR | Status: DC | PRN
  Administered 2016-09-18: 25 mg via INTRAVENOUS

## 2016-09-18 MED ORDER — HYDROMORPHONE HCL 1 MG/ML IJ SOLN
INTRAMUSCULAR | Status: AC
  Filled 2016-09-18: qty 1

## 2016-09-18 MED ORDER — FENTANYL CITRATE (PF) 100 MCG/2ML IJ SOLN
INTRAMUSCULAR | Status: AC
  Filled 2016-09-18: qty 4

## 2016-09-18 MED ORDER — FENTANYL CITRATE (PF) 100 MCG/2ML IJ SOLN
25.0000 ug | INTRAMUSCULAR | Status: DC | PRN
  Administered 2016-09-18 (×3): 50 ug via INTRAVENOUS

## 2016-09-18 MED ORDER — LACTATED RINGERS IV SOLN: INTRAVENOUS | Status: DC

## 2016-09-18 MED ORDER — 0.9 % SODIUM CHLORIDE (POUR BTL) OPTIME
TOPICAL | Status: DC | PRN
  Administered 2016-09-18 (×4): 1000 mL

## 2016-09-18 MED ORDER — ONDANSETRON HCL 4 MG/2ML IJ SOLN
INTRAMUSCULAR | Status: DC | PRN
  Administered 2016-09-18: 4 mg via INTRAVENOUS

## 2016-09-18 MED ORDER — ARTIFICIAL TEARS OP OINT
TOPICAL_OINTMENT | OPHTHALMIC | Status: AC
  Filled 2016-09-18: qty 3.5

## 2016-09-18 MED ORDER — MEPERIDINE HCL 25 MG/ML IJ SOLN: 6.2500 mg | INTRAMUSCULAR | Status: DC | PRN

## 2016-09-18 MED ORDER — HYDROMORPHONE HCL 1 MG/ML IJ SOLN
INTRAMUSCULAR | Status: DC | PRN
  Administered 2016-09-18 (×3): .2 mg via INTRAVENOUS

## 2016-09-18 MED ORDER — FENTANYL CITRATE (PF) 100 MCG/2ML IJ SOLN: 25.0000 ug | INTRAMUSCULAR | Status: DC | PRN

## 2016-09-18 MED ORDER — METOCLOPRAMIDE HCL 5 MG/ML IJ SOLN: 10.0000 mg | Freq: Once | INTRAMUSCULAR | Status: DC | PRN

## 2016-09-18 MED ORDER — KETOROLAC TROMETHAMINE 30 MG/ML IJ SOLN
INTRAMUSCULAR | Status: AC
  Administered 2016-09-18: 30 mg
  Filled 2016-09-18: qty 1

## 2016-09-18 MED ORDER — ONDANSETRON HCL 4 MG/2ML IJ SOLN
INTRAMUSCULAR | Status: AC
  Filled 2016-09-18: qty 2

## 2016-09-18 MED ORDER — POTASSIUM CHLORIDE IN NACL 40-0.9 MEQ/L-% IV SOLN
INTRAVENOUS | Status: DC
  Administered 2016-09-18 (×2): 150 mL/h via INTRAVENOUS
  Filled 2016-09-18 (×3): qty 1000

## 2016-09-18 MED ORDER — DEXTROSE 50 % IV SOLN
INTRAVENOUS | Status: AC
  Filled 2016-09-18: qty 50

## 2016-09-18 MED ORDER — HYDROMORPHONE HCL 1 MG/ML IJ SOLN
INTRAMUSCULAR | Status: AC
  Filled 2016-09-18: qty 0.5

## 2016-09-18 MED ORDER — ENOXAPARIN SODIUM 40 MG/0.4ML ~~LOC~~ SOLN
40.0000 mg | SUBCUTANEOUS | Status: DC
  Administered 2016-09-19 – 2016-09-25 (×7): 40 mg via SUBCUTANEOUS
  Filled 2016-09-18 (×7): qty 0.4

## 2016-09-18 MED ORDER — ONDANSETRON HCL 4 MG/2ML IJ SOLN: 4.0000 mg | Freq: Four times a day (QID) | INTRAMUSCULAR | Status: DC | PRN

## 2016-09-18 MED ORDER — ROCURONIUM BROMIDE 100 MG/10ML IV SOLN
INTRAVENOUS | Status: DC | PRN
  Administered 2016-09-18 (×2): 20 mg via INTRAVENOUS
  Administered 2016-09-18 (×3): 10 mg via INTRAVENOUS

## 2016-09-18 MED ORDER — DEXTROSE 50 % IV SOLN
INTRAVENOUS | Status: DC | PRN
  Administered 2016-09-18: .25 via INTRAVENOUS

## 2016-09-18 MED ORDER — CEFOTETAN DISODIUM-DEXTROSE 2-2.08 GM-% IV SOLR
INTRAVENOUS | Status: AC
  Filled 2016-09-18: qty 50

## 2016-09-18 MED ORDER — PHENYLEPHRINE 40 MCG/ML (10ML) SYRINGE FOR IV PUSH (FOR BLOOD PRESSURE SUPPORT)
PREFILLED_SYRINGE | INTRAVENOUS | Status: AC
  Filled 2016-09-18: qty 10

## 2016-09-18 MED ORDER — SUGAMMADEX SODIUM 200 MG/2ML IV SOLN
INTRAVENOUS | Status: AC
  Filled 2016-09-18: qty 2

## 2016-09-18 MED ORDER — LIDOCAINE HCL (CARDIAC) 20 MG/ML IV SOLN
INTRAVENOUS | Status: DC | PRN
  Administered 2016-09-18: 100 mg via INTRAVENOUS

## 2016-09-18 MED ORDER — DEXTROSE 5 % IV SOLN
2.0000 g | Freq: Two times a day (BID) | INTRAVENOUS | Status: DC
  Filled 2016-09-18 (×2): qty 2

## 2016-09-18 MED ORDER — SUCCINYLCHOLINE CHLORIDE 200 MG/10ML IV SOSY
PREFILLED_SYRINGE | INTRAVENOUS | Status: AC
  Filled 2016-09-18: qty 10

## 2016-09-18 MED ORDER — DEXTROSE 5 % IV SOLN
2.0000 g | Freq: Two times a day (BID) | INTRAVENOUS | Status: DC
  Administered 2016-09-18 – 2016-09-22 (×9): 2 g via INTRAVENOUS
  Filled 2016-09-18 (×10): qty 2

## 2016-09-18 MED ORDER — MIDAZOLAM HCL 2 MG/2ML IJ SOLN
INTRAMUSCULAR | Status: AC
  Filled 2016-09-18: qty 2

## 2016-09-18 MED ORDER — MIDAZOLAM HCL 5 MG/5ML IJ SOLN
INTRAMUSCULAR | Status: DC | PRN
  Administered 2016-09-18: 2 mg via INTRAVENOUS

## 2016-09-18 MED ORDER — LACTATED RINGERS IV SOLN
INTRAVENOUS | Status: DC
  Administered 2016-09-18: 10:00:00 via INTRAVENOUS

## 2016-09-18 MED ORDER — ONDANSETRON HCL 4 MG PO TABS: 4.0000 mg | ORAL_TABLET | Freq: Four times a day (QID) | ORAL | Status: DC | PRN

## 2016-09-18 MED ORDER — FENTANYL CITRATE (PF) 100 MCG/2ML IJ SOLN
INTRAMUSCULAR | Status: DC | PRN
  Administered 2016-09-18 (×2): 50 ug via INTRAVENOUS
  Administered 2016-09-18: 100 ug via INTRAVENOUS
  Administered 2016-09-18: 50 ug via INTRAVENOUS

## 2016-09-18 MED ORDER — LACTATED RINGERS IV SOLN
INTRAVENOUS | Status: DC | PRN
  Administered 2016-09-18 (×3): via INTRAVENOUS

## 2016-09-18 MED ORDER — HYDROMORPHONE HCL 1 MG/ML IJ SOLN
0.5000 mg | INTRAMUSCULAR | Status: DC | PRN
  Administered 2016-09-18: 0.5 mg via INTRAVENOUS

## 2016-09-18 MED ORDER — LABETALOL HCL 5 MG/ML IV SOLN
INTRAVENOUS | Status: DC | PRN
  Administered 2016-09-18: 5 mg via INTRAVENOUS

## 2016-09-18 MED ORDER — SUCCINYLCHOLINE CHLORIDE 20 MG/ML IJ SOLN
INTRAMUSCULAR | Status: DC | PRN
  Administered 2016-09-18: 100 mg via INTRAVENOUS

## 2016-09-18 MED ORDER — POTASSIUM CHLORIDE 2 MEQ/ML IV SOLN
INTRAVENOUS | Status: DC
  Administered 2016-09-18 – 2016-09-20 (×4): via INTRAVENOUS
  Filled 2016-09-18 (×12): qty 1000

## 2016-09-18 MED ORDER — PROPOFOL 10 MG/ML IV BOLUS
INTRAVENOUS | Status: AC
  Filled 2016-09-18: qty 20

## 2016-09-18 MED ORDER — LABETALOL HCL 5 MG/ML IV SOLN
INTRAVENOUS | Status: AC
  Filled 2016-09-18: qty 4

## 2016-09-18 MED ORDER — KETOROLAC TROMETHAMINE 15 MG/ML IJ SOLN: 30.0000 mg | Freq: Once | INTRAMUSCULAR | Status: DC

## 2016-09-18 MED ORDER — PHENYLEPHRINE 40 MCG/ML (10ML) SYRINGE FOR IV PUSH (FOR BLOOD PRESSURE SUPPORT)
PREFILLED_SYRINGE | INTRAVENOUS | Status: DC | PRN
  Administered 2016-09-18: 80 ug via INTRAVENOUS
  Administered 2016-09-18 (×2): 40 ug via INTRAVENOUS

## 2016-09-18 MED ORDER — SUGAMMADEX SODIUM 200 MG/2ML IV SOLN
INTRAVENOUS | Status: DC | PRN
  Administered 2016-09-18: 200 mg via INTRAVENOUS

## 2016-09-18 MED ORDER — PROPOFOL 10 MG/ML IV BOLUS
INTRAVENOUS | Status: DC | PRN
  Administered 2016-09-18: 100 mg via INTRAVENOUS
  Administered 2016-09-18: 60 mg via INTRAVENOUS

## 2016-09-18 SURGICAL SUPPLY — 58 items
BLADE CLIPPER SURG (BLADE) ×2 IMPLANT
CANISTER SUCT 3000ML PPV (MISCELLANEOUS) ×3 IMPLANT
CHLORAPREP W/TINT 26ML (MISCELLANEOUS) ×3 IMPLANT
COVER SURGICAL LIGHT HANDLE (MISCELLANEOUS) ×3 IMPLANT
DRAIN CHANNEL 19F RND (DRAIN) ×2 IMPLANT
DRAPE LAPAROSCOPIC ABDOMINAL (DRAPES) ×3 IMPLANT
DRAPE PROXIMA HALF (DRAPES) ×1 IMPLANT
DRAPE UTILITY XL STRL (DRAPES) ×2 IMPLANT
DRAPE WARM FLUID 44X44 (DRAPE) ×3 IMPLANT
ELECT BLADE 6.5 EXT (BLADE) ×3 IMPLANT
ELECT CAUTERY BLADE 6.4 (BLADE) ×3 IMPLANT
ELECT REM PT RETURN 9FT ADLT (ELECTROSURGICAL) ×3
ELECTRODE REM PT RTRN 9FT ADLT (ELECTROSURGICAL) ×1 IMPLANT
EVACUATOR SILICONE 100CC (DRAIN) ×2 IMPLANT
GAUZE SPONGE 4X4 12PLY STRL (GAUZE/BANDAGES/DRESSINGS) ×3 IMPLANT
GLOVE EUDERMIC 7 POWDERFREE (GLOVE) ×6 IMPLANT
GOWN STRL REUS W/ TWL LRG LVL3 (GOWN DISPOSABLE) ×2 IMPLANT
GOWN STRL REUS W/ TWL XL LVL3 (GOWN DISPOSABLE) ×1 IMPLANT
GOWN STRL REUS W/TWL LRG LVL3 (GOWN DISPOSABLE) ×6
GOWN STRL REUS W/TWL XL LVL3 (GOWN DISPOSABLE) ×3
IV CATH AUTO 14GX1.75 SAFE ORG (IV SOLUTION) ×4 IMPLANT
KIT BASIN OR (CUSTOM PROCEDURE TRAY) ×3 IMPLANT
KIT OSTOMY DRAINABLE 2.75 STR (WOUND CARE) ×2 IMPLANT
KIT ROOM TURNOVER OR (KITS) ×3 IMPLANT
LEGGING LITHOTOMY PAIR STRL (DRAPES) ×1 IMPLANT
LIGASURE IMPACT 36 18CM CVD LR (INSTRUMENTS) ×3 IMPLANT
NS IRRIG 1000ML POUR BTL (IV SOLUTION) ×3 IMPLANT
PACK GENERAL/GYN (CUSTOM PROCEDURE TRAY) ×3 IMPLANT
PAD ARMBOARD 7.5X6 YLW CONV (MISCELLANEOUS) ×6 IMPLANT
RELOAD PROXIMATE 100 BLUE (MISCELLANEOUS) ×1 IMPLANT
RELOAD PROXIMATE 100MM BLUE (MISCELLANEOUS) ×3
RELOAD STAPLE 100 3.8 BLU REG (MISCELLANEOUS) IMPLANT
SPECIMEN JAR SMALL (MISCELLANEOUS) IMPLANT
SPECIMEN JAR X LARGE (MISCELLANEOUS) ×3 IMPLANT
SPONGE LAP 18X18 X RAY DECT (DISPOSABLE) ×4 IMPLANT
STAPLER CUT CVD 40MM GREEN (STAPLE) ×2 IMPLANT
STAPLER PROXIMATE 100MM BLUE (MISCELLANEOUS) ×2 IMPLANT
STAPLER VISISTAT 35W (STAPLE) ×3 IMPLANT
SUCTION POOLE TIP (SUCTIONS) ×3 IMPLANT
SUT ETHILON 3 0 FSL (SUTURE) ×2 IMPLANT
SUT NOV 1 T60/GS (SUTURE) IMPLANT
SUT NOVA 1 T20/GS 25DT (SUTURE) ×2 IMPLANT
SUT PDS AB 1 TP1 96 (SUTURE) ×4 IMPLANT
SUT PROLENE 0 CT 1 CR/8 (SUTURE) IMPLANT
SUT PROLENE 0 SH 30 (SUTURE) ×4 IMPLANT
SUT PROLENE 2 0 SH 30 (SUTURE) ×3 IMPLANT
SUT SILK 2 0 SH CR/8 (SUTURE) ×7 IMPLANT
SUT SILK 2 0 TIES 10X30 (SUTURE) ×3 IMPLANT
SUT SILK 3 0 SH CR/8 (SUTURE) ×3 IMPLANT
SUT SILK 3 0 TIES 10X30 (SUTURE) ×3 IMPLANT
SUT VIC AB 3-0 SH 18 (SUTURE) ×4 IMPLANT
TOWEL OR 17X24 6PK STRL BLUE (TOWEL DISPOSABLE) ×3 IMPLANT
TOWEL OR 17X26 10 PK STRL BLUE (TOWEL DISPOSABLE) ×3 IMPLANT
TRAY FOLEY CATH 14FRSI W/METER (CATHETERS) ×2 IMPLANT
TRAY PROCTOSCOPIC FIBER OPTIC (SET/KITS/TRAYS/PACK) ×1 IMPLANT
UNDERPAD 30X30 (UNDERPADS AND DIAPERS) ×3 IMPLANT
WATER STERILE IRR 1000ML POUR (IV SOLUTION) ×3 IMPLANT
YANKAUER SUCT BULB TIP NO VENT (SUCTIONS) ×3 IMPLANT

## 2016-09-18 NOTE — Anesthesia Postprocedure Evaluation (Signed)
Anesthesia Post Note  Patient: Wanda Baker  Procedure(s) Performed: Procedure(s) (LRB): LOW ANTERIOR COLON RESECTION WITH COLOSTOMY (N/A)  Patient location during evaluation: PACU Anesthesia Type: General Level of consciousness: awake and alert Pain management: pain level controlled Vital Signs Assessment: post-procedure vital signs reviewed and stable Respiratory status: spontaneous breathing, nonlabored ventilation, respiratory function stable and patient connected to nasal cannula oxygen Cardiovascular status: blood pressure returned to baseline and stable Postop Assessment: no signs of nausea or vomiting Anesthetic complications: no       Last Vitals:  Vitals:   09/18/16 1400 09/18/16 1410  BP:  122/72  Pulse: (!) 105 (!) 105  Resp: 16 18  Temp:      Last Pain:  Vitals:   09/18/16 1410  TempSrc:   PainSc: 8                  Montez Hageman

## 2016-09-18 NOTE — Progress Notes (Signed)
Family Medicine Teaching Service Daily Progress Note Intern Pager: [phone]  Patient name: Wanda Baker Medical record number: [id number] Date of birth: 12/18/1960 Age: 56 y.o. 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 female presenting with 4 days of obstipation and nausea without vomiting. PMH is significant for HTN, NIDDM, HLD, GERD, s/p x2 C-sections and abdominal hysterectomy in 2011 due to fibroids. Of note, patient has no prior history of colonoscopy. Negative family history for cancer.   #Acute Obstipation  Nausea without Vomiting: CT abdomen/pelvis concerning for distal colonic mass. GI following. Onset acute per patient history over 4 days. Normal formed stools prior to sudden onset of lower abdominal pain and nausea with distention of abdomen. Has history of abdominal surgery. No h/o colonoscopy. FHx neg for colon cancer. Has history of GI bleeds. Not on anticoagulants. No history of constipation or laxative use. --GI consulted, appreciate recommendations --Plan for Fleet enema and sigmoidoscopy per GI --Patient refusing NG, NPO with MIVF --Zofran PRN nausea --IV Protonix 40 mg QD --Morphine 2 mg q4h PRN for pain  #Acute Blood Loss: Drop in hemoglobin from 13.1 > 10.9 and stabilized at 10.7 area blood per rectum on admission with colonic mass. Suspect this is source. Transfusion threshold <7. --Plan per above or rectosigmoid mass  #Tachycardia  Hypertension: Normotensive. Remains tachycardic in the low 100s since admission.  Afebrile without tachypnea. Has leukocytosis now improving thought to be reactive to obstruction. Anemic at 10.9 as of 4/3. Blood appreciated per rectum during admission. On ACE-I and thiazide for BP control.  --Monitor Hgb with transfusion threshold at 7 --Holding lisinopril 5 mg BID and HCTZ 25 mg QD  #Non-insulin-dependent diabetes mellitus: Chronic. Controlled. A1c 5.7 on admission. --Holding metformin 500 mg BID --CBG daily   #Hyperlipidemia: Chronic. On statin therapy. --Holding Lipitor 20 mg QD  #GERD: Chronic. Controlled on PPI. --Initiating Protonix 40 mg IV while NPO, holding Prilosec 20 mg QD  FEN/GI: IV Protonix, NPO Prophylaxis: SCDs  Disposition: Pending surgical removal of colonic mass with diverting colostomy.  Subjective:  Patient in good spirits understanding surgical procedures. Denies any changes from yesterday.  Objective: Temp:  [98.1 F (36.7 C)-98.7 F (37.1 C)] 98.1 F (36.7 C) (04/04 0658) Pulse Rate:  [88-105] 88 (04/04 0658) Resp:  [15-24] 19 (04/04 0658) BP: (123-139)/(61-88) 127/88 (04/04 0658) SpO2:  [95 %-98 %] 98 % (04/04 3825) Physical Exam: General: well nourished, well developed, in no acute distress with non-toxic appearance HEENT: normocephalic, atraumatic, moist mucous membranes Neck: supple, non-tender without lymphadenopathy CV: regular rate and rhythm without murmurs, rubs, or gallops Lungs: bibasilar crackles, no wheezing with normal work of breathing Abdomen: moderately distended, tense on palpation, mild tenderness at lower quadrants of abdomen, no masses or organomegaly palpable due to distention, absent bowel sounds appreciated, tympanic throughout abdomen Skin: warm, dry, no rashes or lesions, cap refill < 2 seconds Extremities: warm and well perfused, normal tone, non-edematous  Laboratory:  Recent Labs Lab 09/16/16 0930 09/17/16 0328  WBC 17.6* 12.7*  HGB 13.1 10.9*  HCT 40.4 34.9*  PLT 552* 398    Recent  Labs  Lab 09/16/16 0930 09/17/16 0328  NA 135 139  K 3.0* 3.1*  CL 97* 103  CO2 22 24  BUN 21* 15  CREATININE 0.60 0.49  CALCIUM 9.3 8.7*  PROT 7.3  --   BILITOT 1.2  --   ALKPHOS 76  --   ALT 17  --   AST 19  --   GLUCOSE 107* 88   Lipase: <10 HIV: Nonreactive A1c: 5.7 (WNL)  CEA: 8.5 (H)  Imaging/Diagnostic Tests: CT Abdomen Pelvis W Contrast (09/16/2016) FINDINGS: Lower chest: Dependent atelectasis is seen in the lung bases. No pleural or pericardial effusion. Hepatobiliary: The gallbladder is mildly distended. The liver and biliary tree appear normal. Pancreas: Unremarkable. No pancreatic ductal dilatation or surrounding inflammatory changes. Spleen: Normal in size without focal abnormality. Adrenals/Urinary Tract: Adrenal glands are unremarkable. Kidneys are normal, without renal calculi, focal lesion, or hydronephrosis. Bladder is unremarkable. Stomach/Bowel: There is marked distention of the colon with gas and stool to the distal sigmoid where the colon appears focally narrowed with an appearance worrisome for a mass lesion. The stomach, small bowel and appendix appear normal. Vascular/Lymphatic: Scattered aortoiliac atherosclerosis without aneurysm is identified. No lymphadenopathy. Reproductive: Status post hysterectomy. No adnexal masses. Other: No abdominal wall hernia or abnormality. No abdominopelvic ascites. Musculoskeletal: No acute abnormality. No lytic or sclerotic lesion.  IMPRESSION: Findings worrisome for a mass lesion in the distal sigmoid colon causing colonic obstruction. No evidence of metastatic disease identified. Atherosclerosis. Status post hysterectomy.  DG Abd Portable 1V (09/16/2016) FINDINGS: Nasogastric tube is identified distal tip probably at the GE junction. Advancement is recommended. Air-filled distended colon is identified.  IMPRESSION: Nasogastric tube is identified distal tip probably at the GE junction, advancement is  recommended. Air-filled distended colon is identified.  Warrior Bing, DO 09/18/2016, 7:33 AM PGY-1, [facility] Intern pager: [phone], text pages welcome

## 2016-09-18 NOTE — H&P (View-Only) (Signed)
[facility] Consult Note  Wanda Baker 05/18/1961  [id number].    Requesting MD: McMullen Chief Complaint/Reason for Consult: Colon mass  HPI:  Wanda Baker is a 55yo female admitted to [facility] 4/2 with 4 days of obstipation and nausea. States that her pain began last Saturday and has gradually gotten worse. Last BM was Friday 3/30. Pain is diffuse about the abdomen. She reports nausea and abdominal distension. Denies emesis, fever, chills. Denies any previous melena or hematochezia. 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 Biopsy was taken and results are pending. General surgery consulted for evaluation/definitive management of obstruction.  PMH significant for HTN, NIDDM, HLD, GERD Abdominal surgical history: c section x2, abdominal hysterectomy 2011 No prior colonoscopy before admission No known FH of colon cancer Anticoagulants: none Nonsmoker Employment: [employer]  ROS: Review of Systems  Constitutional: Negative.   HENT: Negative.   Eyes: Negative.   Respiratory: Negative.   Cardiovascular: Negative.   Gastrointestinal: Positive for abdominal pain, constipation and nausea. Negative for blood in stool, diarrhea, heartburn, melena and vomiting.  Genitourinary: Negative.   Musculoskeletal: Positive for back pain.  Skin: Negative.   Neurological: Negative.   All systems reviewed and otherwise negative except for as above  Family History  Problem Relation Age of Onset  . Heart disease Maternal Grandmother   . Diabetes Maternal Grandmother   . Hypertension Maternal Grandmother   . Heart disease Maternal Grandfather   . Heart disease Paternal Grandmother   . Diabetes Paternal Grandmother   . Alzheimer's disease Paternal Grandmother     Past Medical History:  Diagnosis Date  . Allergy    uses Nasocort daily and takes Singulair nightly   . Arthritis    . Chronic back pain    buldging disc  . Diabetes mellitus without complication (HCC)    taikes Metformin daily  . GERD (gastroesophageal reflux disease)    takes Omeprazole daily   . History of bronchitis    many yrs ago  . Hyperlipidemia    takes Atorvastatin daily  . Hypertension    takes Lisinopril daily as well HCTZ  . Weakness    left arm    Past Surgical History:  Procedure Laterality Date  . ABDOMINAL HYSTERECTOMY  2011   Supracervical Hysterectomy  . ANTERIOR CERVICAL DECOMP/DISCECTOMY FUSION N/A 12/02/2013   Procedure: ANTERIOR CERVICAL DECOMPRESSION/DISCECTOMY FUSION 3 LEVELS;  Surgeon: Joseph Stern, MD;  Location: [facility];  Service: Neurosurgery;  Laterality: N/A;  C4-5 C5-6 C6-7 Anterior cervical decompression/diskectomy/fusion  . CESAREAN SECTION  86/88  . TUBAL LIGATION      Social History:  reports that she has quit smoking. She has never used smokeless tobacco. She reports that she does not drink alcohol or use drugs.  Allergies:  Allergies  Allergen Reactions  . Prednisone     REACTION: Rash  . Vicodin [Hydrocodone-Acetaminophen] Other (See Comments)    dizziness  . Penicillins Swelling and Rash    Has patient had a PCN reaction causing immediate rash, facial/tongue/throat swelling, SOB or lightheadedness with hypotension: Yes Has patient had a PCN reaction causing severe rash involving mucus membranes or skin necrosis: No Has patient had a PCN reaction that required hospitalization No Has patient had a PCN reaction occurring within the last 10 years: No If all of the above answers are "NO", then may proceed with Cephalosporin use.     Medications Prior to Admission    Medication Sig Dispense Refill  . acetaminophen (TYLENOL) 500 MG tablet Take 1,000 mg by mouth every 6 (six) hours as needed for mild pain or moderate pain.    . cetirizine (ZYRTEC) 10 MG tablet Take 10 mg by mouth daily.    [name] Kitchen esomeprazole (NEXIUM) 20 MG capsule Take 20 mg by mouth  daily at 12 noon.    . hydrochlorothiazide (HYDRODIURIL) 25 MG tablet TAKE 1 TABLET BY MOUTH DAILY 90 tablet 0  . lisinopril (PRINIVIL,ZESTRIL) 5 MG tablet TAKE 2 TABLETS BY MOUTH EVERY DAY 180 tablet 0  . metFORMIN (GLUCOPHAGE) 500 MG tablet TAKE 1 TABLET BY MOUTH TWICE DAILY WITH A MEAL 60 tablet 0  . atorvastatin (LIPITOR) 20 MG tablet TAKE 1 TABLET BY MOUTH EVERY DAY (Patient not taking: Reported on 09/16/2016) 90 tablet 3  . montelukast (SINGULAIR) 10 MG tablet TAKE 1 TABLET BY MOUTH AT BEDTIME (Patient not taking: Reported on 09/16/2016) 30 tablet 11  . Omega-3 Fatty Acids (FISH OIL CONCENTRATE) 1000 MG CAPS Take 1,000 mg by mouth daily.     [name] Kitchen omeprazole (PRILOSEC) 20 MG capsule TAKE ONE CAPSULE BY MOUTH EVERY DAY (Patient not taking: Reported on 09/16/2016) 30 capsule 5  . triamcinolone (NASACORT AQ) 55 MCG/ACT nasal inhaler Place 2 sprays into the nose daily. (Patient not taking: Reported on 09/16/2016) 1 Inhaler 12    Prior to Admission medications   Medication Sig Start Date End Date Taking? Authorizing Provider  acetaminophen (TYLENOL) 500 MG tablet Take 1,000 mg by mouth every 6 (six) hours as needed for mild pain or moderate pain.   Yes Historical Provider, MD  cetirizine (ZYRTEC) 10 MG tablet Take 10 mg by mouth daily.   Yes Historical Provider, MD  esomeprazole (NEXIUM) 20 MG capsule Take 20 mg by mouth daily at 12 noon.   Yes Historical Provider, MD  hydrochlorothiazide (HYDRODIURIL) 25 MG tablet TAKE 1 TABLET BY MOUTH DAILY 08/29/16  Yes Carlyle Dolly, MD  lisinopril (PRINIVIL,ZESTRIL) 5 MG tablet TAKE 2 TABLETS BY MOUTH EVERY DAY 09/04/16  Yes Asiyah Cletis Media, MD  metFORMIN (GLUCOPHAGE) 500 MG tablet TAKE 1 TABLET BY MOUTH TWICE DAILY WITH A MEAL 07/18/16  Yes Asiyah Cletis Media, MD  atorvastatin (LIPITOR) 20 MG tablet TAKE 1 TABLET BY MOUTH EVERY DAY Patient not taking: Reported on 09/16/2016 05/26/14   Tamela Oddi Hess, DO  montelukast (SINGULAIR) 10 MG tablet TAKE 1 TABLET BY MOUTH  AT BEDTIME Patient not taking: Reported on 09/16/2016    Nolon Rod, DO  Omega-3 Fatty Acids (FISH OIL CONCENTRATE) 1000 MG CAPS Take 1,000 mg by mouth daily.     Historical Provider, MD  omeprazole (PRILOSEC) 20 MG capsule TAKE ONE CAPSULE BY MOUTH EVERY DAY Patient not taking: Reported on 09/16/2016 02/17/14   Tamela Oddi Hess, DO  triamcinolone (NASACORT AQ) 55 MCG/ACT nasal inhaler Place 2 sprays into the nose daily. Patient not taking: Reported on 09/16/2016 10/29/12   Donnamae Jude, MD    Blood pressure 139/72, pulse 98, temperature 98.3 F (36.8 C), temperature source Oral, resp. rate (!) 24, height 5' 7" (1.702 m), weight 148 lb 5.9 oz (67.3 kg), SpO2 96 %. Physical Exam: General: cooperative, WD/WN white female who is laying in bed in NAD HEENT: head is normocephalic, atraumatic.  Sclera are noninjected.  Ears and nose without any masses or lesions.  Mouth is pink and moist. Dentition fair. Heart: regular, rate, and rhythm.  No obvious murmurs, gallops, or rubs noted.  Palpable pedal  pulses bilaterally Lungs: CTAB, no wheezes, rhonchi, or rales noted.  Respiratory effort nonlabored Abd: soft, distended, +BS, no masses, hernias, or organomegaly. No TTP. MS: all 4 extremities are symmetrical with no cyanosis, clubbing, or edema. Skin: warm and dry with no masses, lesions, or rashes Psych: A&Ox3 with an appropriate affect. Neuro: CM 2-12 intact, extremity CSM intact bilaterally, normal speech  Results for orders placed or performed during the hospital encounter of 09/16/16 (from the past 48 hour(s))  Lipase, blood     Status: Abnormal   Collection Time: 09/16/16  9:30 AM  Result Value Ref Range   Lipase <10 (L) 11 - 51 U/L  Comprehensive metabolic panel     Status: Abnormal   Collection Time: 09/16/16  9:30 AM  Result Value Ref Range   Sodium 135 135 - 145 mmol/L   Potassium 3.0 (L) 3.5 - 5.1 mmol/L   Chloride 97 (L) 101 - 111 mmol/L   CO2 22 22 - 32 mmol/L   Glucose, Bld 107 (H) 65 - 99  mg/dL   BUN 21 (H) 6 - 20 mg/dL   Creatinine, Ser 0.60 0.44 - 1.00 mg/dL   Calcium 9.3 8.9 - 10.3 mg/dL   Total Protein 7.3 6.5 - 8.1 g/dL   Albumin 3.8 3.5 - 5.0 g/dL   AST 19 15 - 41 U/L   ALT 17 14 - 54 U/L   Alkaline Phosphatase 76 38 - 126 U/L   Total Bilirubin 1.2 0.3 - 1.2 mg/dL   GFR calc non Af Amer >60 >60 mL/min   GFR calc Af Amer >60 >60 mL/min    Comment: (NOTE) The eGFR has been calculated using the CKD EPI equation. This calculation has not been validated in all clinical situations. eGFR's persistently <60 mL/min signify possible Chronic Kidney Disease.    Anion gap 16 (H) 5 - 15  CBC     Status: Abnormal   Collection Time: 09/16/16  9:30 AM  Result Value Ref Range   WBC 17.6 (H) 4.0 - 10.5 K/uL   RBC 5.19 (H) 3.87 - 5.11 MIL/uL   Hemoglobin 13.1 12.0 - 15.0 g/dL   HCT 40.4 36.0 - 46.0 %   MCV 77.8 (L) 78.0 - 100.0 fL   MCH 25.2 (L) 26.0 - 34.0 pg   MCHC 32.4 30.0 - 36.0 g/dL   RDW 14.0 11.5 - 15.5 %   Platelets 552 (H) 150 - 400 K/uL  Urinalysis, Routine w reflex microscopic     Status: Abnormal   Collection Time: 09/16/16 10:09 AM  Result Value Ref Range   Color, Urine YELLOW YELLOW   APPearance CLEAR CLEAR   Specific Gravity, Urine >1.046 (H) 1.005 - 1.030   pH 6.0 5.0 - 8.0   Glucose, UA 50 (A) NEGATIVE mg/dL   Hgb urine dipstick NEGATIVE NEGATIVE   Bilirubin Urine NEGATIVE NEGATIVE   Ketones, ur NEGATIVE NEGATIVE mg/dL   Protein, ur NEGATIVE NEGATIVE mg/dL   Nitrite NEGATIVE NEGATIVE   Leukocytes, UA NEGATIVE NEGATIVE  Basic metabolic panel     Status: Abnormal   Collection Time: 09/17/16  3:28 AM  Result Value Ref Range   Sodium 139 135 - 145 mmol/L   Potassium 3.1 (L) 3.5 - 5.1 mmol/L   Chloride 103 101 - 111 mmol/L   CO2 24 22 - 32 mmol/L   Glucose, Bld 88 65 - 99 mg/dL   BUN 15 6 - 20 mg/dL   Creatinine, Ser 0.49 0.44 - 1.00 mg/dL  Calcium 8.7 (L) 8.9 - 10.3 mg/dL   GFR calc non Af Amer >60 >60 mL/min   GFR calc Af Amer >60 >60 mL/min     Comment: (NOTE) The eGFR has been calculated using the CKD EPI equation. This calculation has not been validated in all clinical situations. eGFR's persistently <60 mL/min signify possible Chronic Kidney Disease.    Anion gap 12 5 - 15  CBC     Status: Abnormal   Collection Time: 09/17/16  3:28 AM  Result Value Ref Range   WBC 12.7 (H) 4.0 - 10.5 K/uL   RBC 4.40 3.87 - 5.11 MIL/uL   Hemoglobin 10.9 (L) 12.0 - 15.0 g/dL   HCT 34.9 (L) 36.0 - 46.0 %   MCV 79.3 78.0 - 100.0 fL   MCH 24.8 (L) 26.0 - 34.0 pg   MCHC 31.2 30.0 - 36.0 g/dL   RDW 14.0 11.5 - 15.5 %   Platelets 398 150 - 400 K/uL   Ct Abdomen Pelvis W Contrast  Result Date: 09/16/2016 CLINICAL DATA:  Abdominal pain, constipation and nausea for 5 days. EXAM: CT ABDOMEN AND PELVIS WITH CONTRAST TECHNIQUE: Multidetector CT imaging of the abdomen and pelvis was performed using the standard protocol following bolus administration of intravenous contrast. CONTRAST:  100 ml ISOVUE-300 IOPAMIDOL (ISOVUE-300) INJECTION 61% COMPARISON:  None. FINDINGS: Lower chest: Dependent atelectasis is seen in the lung bases. No pleural or pericardial effusion. Hepatobiliary: The gallbladder is mildly distended. The liver and biliary tree appear normal. Pancreas: Unremarkable. No pancreatic ductal dilatation or surrounding inflammatory changes. Spleen: Normal in size without focal abnormality. Adrenals/Urinary Tract: Adrenal glands are unremarkable. Kidneys are normal, without renal calculi, focal lesion, or hydronephrosis. Bladder is unremarkable. Stomach/Bowel: There is marked distention of the colon with gas and stool to the distal sigmoid where the colon appears focally narrowed with an appearance worrisome for a mass lesion. The stomach, small bowel and appendix appear normal. Vascular/Lymphatic: Scattered aortoiliac atherosclerosis without aneurysm is identified. No lymphadenopathy. Reproductive: Status post hysterectomy. No adnexal masses. Other: No  abdominal wall hernia or abnormality. No abdominopelvic ascites. Musculoskeletal: No acute abnormality. No lytic or sclerotic lesion. IMPRESSION: Findings worrisome for a mass lesion in the distal sigmoid colon causing colonic obstruction. No evidence of metastatic disease identified. Atherosclerosis. Status post hysterectomy. Electronically Signed   By: Inge Rise M.D.   On: 09/16/2016 11:08   Dg Abd Portable 1v  Result Date: 09/16/2016 CLINICAL DATA:  Nasogastric tube placement. EXAM: PORTABLE ABDOMEN - 1 VIEW COMPARISON:  CT abdomen pelvis September 16, 2016 FINDINGS: Nasogastric tube is identified distal tip probably at the GE junction. Advancement is recommended. Air-filled distended colon is identified. IMPRESSION: Nasogastric tube is identified distal tip probably at the GE junction, advancement is recommended. Air-filled distended colon is identified. Electronically Signed   By: Abelardo Diesel M.D.   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 I will discuss surgical timing with MD. Recommend continuing NPO for now as she is fully obstructed. Follow biopsy results. Will order CEA.  BROOKE A  Sabra Heck, [facility] 09/17/2016, 3:30 PM Pager: [phone] Consults: [phone] Mon-Fri 7:00 am-4:30 pm Sat-Sun 7:00 am-11:30 am

## 2016-09-18 NOTE — Op Note (Signed)
Patient Name:           Wanda Baker   Date of Surgery:        09/18/2016  Pre op Diagnosis:      Neoplasm of rectum with complete obstruction  Post op Diagnosis:    Same  Procedure:                 Exploratory laparotomy, low anterior resection of rectal mass, diverting colostomy  Surgeon:                     Edsel Petrin. Dalbert Batman, M.D., FACS  Assistant:                      Sharyn Dross, RNFA   Indication for Assistant: Very complex exposure  Operative Indications:   This is a 56 year old female who presents with a one-week history of abdominal pain and abdominal distention.  Abdominal x-rays and CT scan shows significant colonic distention throughout.  Endoscopy confirmed neoplastic mass at 15 cm yesterday afternoon.  Biopsies are pending but cancer is suspected.  On examination she is markedly distended but not really tender.  She is brought to the operating room urgently for relief of her obstruction and resection of her presumed rectal cancer.  Operative Findings:       The colon was markedly distended.  Using a large Angiocath I was able to decompress the proximal and distal segments evacuating a fair amount of air.  This made management of the distal segment and the descending colon manageable.  There was a lot of semi- formed stool which could not be evacuated.  There was a palpable mass at least 4 cm in diameter just below the peritoneal reflection.  This was somewhat mobile.  I was able to staple a contour stapler 2 or 3 cm below the mass.  This was almost at the pelvic floor.  2 #1 Prolene sutures were placed on the rectal stump.  There was no sign of any metastatic cancer.  There was mild stool contamination during the case but there was no evidence of perforation going in.  The colostomy looked pink and viable and bled easily.  The NG tube was positioned in the stomach during the case.  Procedure in Detail:          Following the induction of general endotracheal anesthesia a Foley  catheter and nasogastric tube were placed.  The abdomen was prepped and draped in a sterile fashion.  Intravenous antibiotics were given.  Surgical timeout was performed.     A midline incision was made starting just above the umbilicus and going down to the suprapubic area.  The fascia was incised in the midline.  Abdominal cavity was entered and explored with findings as described above.  There were some chronic small bowel adhesions in the pelvis which I took down and this allowed  me to mobilize the small bowel proximally.  The uterus was surgically absent.  The small bowel was not dilated.  I mobilized the sigmoid colon and descending colon away from the lateral peritoneal attachments.  I then selected a point in the mid to distal sigmoid colon for transection.  I created a window and transected the colon with the GIA 100 mm stapling device.  Decompression of proximal distal segments were performed with Angiocath and the holes closed with a Purstring suture of 2-0 silk.  Self-retaining retractor was placed.      Using the  LigaSure by device as well as 2-0 silk ties I took down the distal sigmoid and rectal mesentery.  I took great care to incise the peritoneum and stay right in the midline to avoid injury to the ureters in the retroperitoneum.  I took the dissection down of the hollow of the sacrum and was able to get below the tumor.  Some of this was blunt dissection some of this was taken down with the LigaSure device.  The lateral attachments were divided with the LigaSure device.  I incised the peritoneum anterior to the rectum and continue the anterior dissection down into the retroperitoneal space.  Once I got well below the tumor I skeletonized the rectal wall taking down the mesentery and fatty attachments with the LigaSure device and electrocautery.  I continued this dissection until there was nothing left but the rectal wall.  I divided the rectum with a contour stapler on a green load.  Specimen  was removed and the distal margin marked with sutures and sent to the lab.  The rectal stump was marked with a 2-0 Prolene sutures.  Pelvis was irrigated.  Hemostasis was good.      The proximal colon was a little bit dusky and I think its mesenteric blood supply had been compromised.  I resected about 8 inches of sigmoid colon using the GIA stapling device and then I had pink and healthy proximal sigmoid to form the colostomy.  I excised a circular button of skin and subcutaneous fat in the left abdominal wall at the pre-marked position at about the level of the umbilicus.  The fascia was incised in a cruciate fashion and the opening dilated to about 2-1/2 fingerbreadths.  Using a Babcock clamp I was able to pass the proximal sigmoid colon through the abdominal wall without any difficulty.  I tacked the colon to the fascia with 3 interrupted sutures of 2-0 Vicryl.      I copiously irrigated out the abdomen and pelvis with about 6 L of saline.  There was no bleeding or obvious contamination.  Bainbridge drain was placed into the pelvis and brought out through the right lower quadrant and connected to suction bulb after suturing to the skin.  The omentum was brought down.  The proximal colon was still dilated.  Midline fascia was closed with a running suture of #1, double string PDS and several interrupted #1 Novafil's.  The skin was packed open with gauze.  The colostomy was matured.  I abdicated staple line and matured the colostomy with 10 or 12 interrupted sutures of 3-0 Vicryl.  I could pass my finger down through the colostomy below the fascia there was no obstruction.  Colostomy bag and bandages were placed.  The patient tolerated the procedure well was taken to PACU in stable condition.  EBL 300 mL.  Counts correct.  Complications none.               Edsel Petrin. Dalbert Batman, M.D., FACS General and Minimally Invasive Surgery Breast and Colorectal Surgery  09/18/2016 12:34 PM

## 2016-09-18 NOTE — Anesthesia Preprocedure Evaluation (Signed)
Anesthesia Evaluation  Patient identified by MRN, date of birth, ID band Patient awake    Reviewed: Allergy & Precautions, NPO status , Patient's Chart, lab work & pertinent test results  Airway Mallampati: II  TM Distance: >3 FB Neck ROM: Full    Dental no notable dental hx. (+) Poor Dentition, Chipped   Pulmonary former smoker,    Pulmonary exam normal breath sounds clear to auscultation       Cardiovascular hypertension, Pt. on medications Normal cardiovascular exam Rhythm:Regular Rate:Normal     Neuro/Psych negative neurological ROS  negative psych ROS   GI/Hepatic negative GI ROS, Neg liver ROS,   Endo/Other  diabetes, Type 2, Oral Hypoglycemic Agents  Renal/GU negative Renal ROS  negative genitourinary   Musculoskeletal negative musculoskeletal ROS (+)   Abdominal   Peds negative pediatric ROS (+)  Hematology negative hematology ROS (+)   Anesthesia Other Findings   Reproductive/Obstetrics negative OB ROS                            Anesthesia Physical Anesthesia Plan  ASA: II  Anesthesia Plan: General   Post-op Pain Management:    Induction: Intravenous  Airway Management Planned: Oral ETT  Additional Equipment:   Intra-op Plan:   Post-operative Plan: Extubation in OR  Informed Consent: I have reviewed the patients History and Physical, chart, labs and discussed the procedure including the risks, benefits and alternatives for the proposed anesthesia with the patient or authorized representative who has indicated his/her understanding and acceptance.   Dental advisory given  Plan Discussed with: CRNA  Anesthesia Plan Comments:         Anesthesia Quick Evaluation

## 2016-09-18 NOTE — Anesthesia Postprocedure Evaluation (Signed)
Anesthesia Post Note  Patient: Wanda Baker  Procedure(s) Performed: Procedure(s) (LRB): FLEXIBLE SIGMOIDOSCOPY (N/A)  Patient location during evaluation: Endoscopy Anesthesia Type: MAC Level of consciousness: awake and alert Pain management: pain level controlled Vital Signs Assessment: post-procedure vital signs reviewed and stable Respiratory status: spontaneous breathing, nonlabored ventilation, respiratory function stable and patient connected to nasal cannula oxygen Cardiovascular status: stable and blood pressure returned to baseline Anesthetic complications: no       Last Vitals:  Vitals:   09/18/16 0658 09/18/16 0853  BP: 127/88 135/69  Pulse: 88 (!) 108  Resp: 19 20  Temp: 36.7 C 37 C    Last Pain:  Vitals:   09/18/16 0853  TempSrc: Oral  PainSc:                  Dottie Vaquerano,JAMES TERRILL

## 2016-09-18 NOTE — Interval H&P Note (Signed)
History and Physical Interval Note:  09/18/2016 9:25 AM  Wanda Baker  has presented today for surgery, with the diagnosis of Possible Colon CA  The various methods of treatment have been discussed with the patient and family. After consideration of risks, benefits and other options for treatment, the patient has consented to  Procedure(s): LOW ANTERIOR RESECTION WITH COLOSTOMY (N/A) as a surgical intervention .  The patient's history has been reviewed, patient examined, no change in status, stable for surgery.  I have reviewed the patient's chart and labs.  Questions were answered to the patient's satisfaction.     [name]

## 2016-09-18 NOTE — Progress Notes (Signed)
FPTS Interim Progress Note  S: Evaluated patient after surgery. Doing well. No concerns or questions  O: BP 125/72 (BP Location: Left Arm)   Pulse (!) 109   Temp 98.2 F (36.8 C)   Resp 18   Ht 5\' 7"  (1.702 m)   Wt 67.3 kg (148 lb 5.9 oz)   SpO2 98%   BMI 23.24 kg/m   GEN: NAD CV: RRR, no murmurs, rubs, or gallops PULM: CTAB, normal effort ABD: Soft, colostomy noted and abdominal dressing at midline which is clean, dry and intact . SKIN: No rash or cyanosis; warm and well-perfused NEURO: Awake, alert   Smiley Houseman, MD 09/18/2016, 5:04 PM PGY-2, [facility] Medicine Service pager [phone]

## 2016-09-18 NOTE — Anesthesia Procedure Notes (Signed)
Procedure Name: Intubation Date/Time: 09/18/2016 10:05 AM Performed by: Mariea Clonts Pre-anesthesia Checklist: Patient identified, Emergency Drugs available, Suction available and Patient being monitored Patient Re-evaluated:Patient Re-evaluated prior to inductionOxygen Delivery Method: Circle System Utilized Preoxygenation: Pre-oxygenation with 100% oxygen Intubation Type: IV induction Ventilation: Mask ventilation without difficulty Laryngoscope Size: Glidescope and 3 (DL attempt x2 with Mil 3 (grade 4 view)) Grade View: Grade I Tube type: Oral Tube size: 7.0 mm Number of attempts: 1 Airway Equipment and Method: Stylet Placement Confirmation: ETT inserted through vocal cords under direct vision,  positive ETCO2 and breath sounds checked- equal and bilateral Tube secured with: Tape Dental Injury: Teeth and Oropharynx as per pre-operative assessment  Difficulty Due To: Difficult Airway- due to anterior larynx and Difficulty was anticipated Future Recommendations: Recommend- induction with short-acting agent, and alternative techniques readily available

## 2016-09-18 NOTE — Consult Note (Addendum)
[facility] Nurse requested for preoperative stoma site marking  Discussed surgical procedure and stoma creation with patient and family.  Explained role of the [facility] nurse team.  Provided the patient with educational booklet/DVD and provided samples of pouching options.  Answered patient and family questions.   Examined patient lying, and sitting with head slightly up, but she was in pain and stated she could not tolerate any other repositioning. Marking placed in the patient's visual field, away from any creases or abdominal contour issues and within the rectus muscle, however, patient's abd is very swollen at this time and folds may appear post-op after this issue is resolved.  Attempted to mark below the patient's belt line.   Marked for colostomy in the LLQ  _8___ cm to the left of the umbilicus and __0__ZT below the umbilicus.  Patient's abdomen cleansed with CHG wipes at site markings, allowed to air dry prior to marking.Pt plans for surgery early this am and denies further questions at this time.  [facility] team will plan to follow post-op for teaching sessions. Julien Girt MSN, RN, [place], [place], [place]

## 2016-09-18 NOTE — Transfer of Care (Signed)
Immediate Anesthesia Transfer of Care Note  Patient: Wanda Baker  Procedure(s) Performed: Procedure(s): LOW ANTERIOR COLON RESECTION WITH COLOSTOMY (N/A)  Patient Location: PACU  Anesthesia Type:General  Level of Consciousness: awake, alert  and oriented  Airway & Oxygen Therapy: Patient Spontanous Breathing and Patient connected to nasal cannula oxygen  Post-op Assessment: Report given to RN, Post -op Vital signs reviewed and stable and Patient moving all extremities X 4  Post vital signs: Reviewed and stable  Last Vitals:  Vitals:   09/18/16 0658 09/18/16 0853  BP: 127/88 135/69  Pulse: 88 (!) 108  Resp: 19 20  Temp: 36.7 C 37 C    Last Pain:  Vitals:   09/18/16 0853  TempSrc: Oral  PainSc:       Patients Stated Pain Goal: 3 (58/59/29 2446)  Complications: No apparent anesthesia complications

## 2016-09-18 NOTE — Progress Notes (Signed)
Hypoglycemic Event  CBG: 72  Treatment:  none per anesthesiologist  Symptoms: None  Follow-up CBG: Time:na  CBG Result:na  Possible Reasons for Event: Inadequate meal intake  Comments/MD notified:Dr. Marcell Barlow notified and stated to patient that they would monitor it during surgery     Schonewitz, Eulis Canner

## 2016-09-19 ENCOUNTER — Encounter: Payer: Self-pay | Admitting: Oncology

## 2016-09-19 ENCOUNTER — Telehealth: Payer: Self-pay | Admitting: Oncology

## 2016-09-19 ENCOUNTER — Encounter (HOSPITAL_COMMUNITY): Payer: Self-pay | Admitting: General Surgery

## 2016-09-19 LAB — CBC
HCT: 26.6 % — ABNORMAL LOW (ref 36.0–46.0)
HEMATOCRIT: 31.8 % — AB (ref 36.0–46.0)
HEMOGLOBIN: 10 g/dL — AB (ref 12.0–15.0)
Hemoglobin: 8.5 g/dL — ABNORMAL LOW (ref 12.0–15.0)
MCH: 25.1 pg — AB (ref 26.0–34.0)
MCH: 25.2 pg — ABNORMAL LOW (ref 26.0–34.0)
MCHC: 31.4 g/dL (ref 30.0–36.0)
MCHC: 32 g/dL (ref 30.0–36.0)
MCV: 79.9 fL (ref 78.0–100.0)
MCV: 78.9 fL (ref 78.0–100.0)
PLATELETS: 341 10*3/uL (ref 150–400)
Platelets: 424 10*3/uL — ABNORMAL HIGH (ref 150–400)
RBC: 3.98 MIL/uL (ref 3.87–5.11)
RBC: 3.37 MIL/uL — ABNORMAL LOW (ref 3.87–5.11)
RDW: 14.5 % (ref 11.5–15.5)
RDW: 14.4 % (ref 11.5–15.5)
WBC: 17.3 10*3/uL — ABNORMAL HIGH (ref 4.0–10.5)
WBC: 14.5 10*3/uL — AB (ref 4.0–10.5)

## 2016-09-19 LAB — GLUCOSE, CAPILLARY: GLUCOSE-CAPILLARY: 100 mg/dL — AB (ref 65–99)

## 2016-09-19 LAB — BASIC METABOLIC PANEL
Anion gap: 9 (ref 5–15)
BUN: 9 mg/dL (ref 6–20)
CALCIUM: 8.1 mg/dL — AB (ref 8.9–10.3)
CHLORIDE: 106 mmol/L (ref 101–111)
CO2: 23 mmol/L (ref 22–32)
Creatinine, Ser: 0.63 mg/dL (ref 0.44–1.00)
GFR calc Af Amer: >60 mL/min (ref >60)
GLUCOSE: 100 mg/dL — AB (ref 65–99)
POTASSIUM: 3.9 mmol/L (ref 3.5–5.1)
SODIUM: 138 mmol/L (ref 135–145)

## 2016-09-19 MED ORDER — PANTOPRAZOLE SODIUM 40 MG IV SOLR: 40.0000 mg | INTRAVENOUS | Status: DC

## 2016-09-19 MED ORDER — PANTOPRAZOLE SODIUM 40 MG PO TBEC
40.0000 mg | DELAYED_RELEASE_TABLET | Freq: Every day | ORAL | Status: DC
  Administered 2016-09-23 – 2016-09-25 (×3): 40 mg via ORAL
  Filled 2016-09-19 (×5): qty 1

## 2016-09-19 MED ORDER — MORPHINE SULFATE (PF) 2 MG/ML IV SOLN
1.0000 mg | INTRAVENOUS | Status: DC | PRN
  Administered 2016-09-19 – 2016-09-23 (×9): 2 mg via INTRAVENOUS
  Filled 2016-09-19 (×9): qty 1

## 2016-09-19 MED ORDER — METHOCARBAMOL 1000 MG/10ML IJ SOLN
500.0000 mg | Freq: Four times a day (QID) | INTRAVENOUS | Status: DC | PRN
  Administered 2016-09-19 – 2016-09-20 (×2): 500 mg via INTRAVENOUS
  Filled 2016-09-19 (×6): qty 5

## 2016-09-19 NOTE — Progress Notes (Signed)
Patient ID: Wanda Baker, female   DOB: 1960/11/07, 56 y.o.   MRN: [id number]  [facility] Surgery Progress Note  1 Day Post-Op  Subjective: Patient feeling ok this morning. States that her abdomen is sore and she wants something to eat. She does have stool and air in colostomy bag. Denies n/v.   Objective: Vital signs in last 24 hours: Temp:  [97.5 F (36.4 C)-100.4 F (38 C)] 99 F (37.2 C) (04/05 0530) Pulse Rate:  [92-136] 110 (04/05 0600) Resp:  [11-20] 16 (04/05 0530) BP: (108-135)/(57-87) 112/61 (04/05 0530) SpO2:  [95 %-100 %] 98 % (04/05 0530) Last BM Date: 09/12/16  Intake/Output from previous day: 04/04 0701 - 04/05 0700 In: 3956.7 [P.O.:60; I.V.:3766.7; NG/GT:80; IV Piggyback:50] Out: 1695 [Urine:650; Emesis/NG output:150; Drains:175; Stool:420; Blood:300] Intake/Output this shift: Total I/O In: -  Out: 75 [Urine:75]  PE: Gen:  Alert, NAD, pleasant Card:  RRR, no M/G/R heard Pulm:  CTAB, no W/R/R, effort normal Abd: Soft, mild distension, +BS, open midline incision clean with healthy pink tissue and no drainage or surrounding erythema, colostomy with liquid brown stool and air in bag, drain with serous fluid in bulb  Lab Results:   Recent Labs  09/18/16 1559 09/19/16 0329  WBC 15.7* 17.3*  HGB 10.5* 10.0*  HCT 33.5* 31.8*  PLT 435* 424*   BMET  Recent Labs  09/18/16 1559 09/19/16 0329  NA 139 138  K 4.1 3.9  CL 109 106  CO2 20* 23  GLUCOSE 105* 100*  BUN 9 9  CREATININE 0.58 0.63  CALCIUM 8.2* 8.1*   PT/INR No results for input(s): LABPROT, INR in the last 72 hours. CMP     Component Value Date/Time   NA 138 09/19/2016 0329   K 3.9 09/19/2016 0329   CL 106 09/19/2016 0329   CO2 23 09/19/2016 0329   GLUCOSE 100 (H) 09/19/2016 0329   BUN 9 09/19/2016 0329   CREATININE 0.63 09/19/2016 0329   CREATININE 0.51 09/21/2015 1502   CALCIUM 8.1 (L) 09/19/2016 0329   PROT 7.3 09/16/2016 0930   ALBUMIN 3.8 09/16/2016 0930   AST 19  09/16/2016 0930   ALT 17 09/16/2016 0930   ALKPHOS 76 09/16/2016 0930   BILITOT 1.2 09/16/2016 0930   GFRNONAA >60 09/19/2016 0329   GFRNONAA >89 09/21/2015 1502   GFRAA >60 09/19/2016 0329   GFRAA >89 09/21/2015 1502   Lipase     Component Value Date/Time   LIPASE <10 (L) 09/16/2016 0930       Studies/Results: Dg Chest 2 View  Result Date: 09/17/2016 CLINICAL DATA:  Preop history of diabetes and hypertension, colon mass EXAM: CHEST  2 VIEW COMPARISON:  11/24/2013 FINDINGS: Postsurgical changes of the lower cervical spine. Mildly diminished lung volumes. Mild bibasilar atelectasis. Borderline cardiomegaly, augmented by low lung volume. No pneumothorax. Dilated bowel in the upper abdomen. IMPRESSION: 1. Low lung volumes with bibasilar atelectasis 2. Borderline cardiomegaly, exaggerated by low inspiration. Electronically Signed   By: Donavan Foil M.D.   On: 09/17/2016 23:40    Anti-infectives: Anti-infectives    Start     Dose/Rate Route Frequency Ordered Stop   09/18/16 2200  cefoTEtan (CEFOTAN) 2 g in dextrose 5 % 50 mL IVPB     2 g 100 mL/hr over 30 Minutes Intravenous Every 12 hours 09/18/16 1539     09/18/16 1545  cefoTEtan (CEFOTAN) 2 g in dextrose 5 % 50 mL IVPB  Status:  Discontinued     2 g  100 mL/hr over 30 Minutes Intravenous Every 12 hours 09/18/16 1539 09/18/16 1539   09/18/16 0859  cefoTEtan in Dextrose 5% (CEFOTAN) 2-2.08 GM-% IVPB    Comments:  Schonewitz, Leigh   : cabinet override      09/18/16 0859 09/18/16 2114   09/18/16 0000  cefoTEtan (CEFOTAN) 2 g in dextrose 5 % 50 mL IVPB     2 g 100 mL/hr over 30 Minutes Intravenous To Surgery 09/17/16 2016 09/18/16 1041       Assessment/Plan Neoplasm of rectum with complete obstruction S/p  Exploratory laparotomy, low anterior resection of rectal mass, diverting colostomy 09/18/16 Dr. Dalbert Batman - POD 1 - surgical path pending - WBC 17.3, afebrile - drain with 175cc serous drainage  Anemia - Hg 10, stable,  continue to monitor HTN NIDDM HLD GERD  ID - cefotetan 4/4>> FEN - IVF, NPO/NGT VTE - SCDs, lovenox  Plan - continue NPO/NGT. Leave foley for today. Encourage ambulation/IS. Follow surgical pathology. Continue IV antibiotics. Will consult WOC for wound vac. Labs in AM.   LOS: 3 days    Jerrye Beavers , [facility] Surgery 09/19/2016, 8:05 AM Pager: [phone] Consults: [phone] Mon-Fri 7:00 am-4:30 pm Sat-Sun 7:00 am-11:30 am

## 2016-09-19 NOTE — Progress Notes (Signed)
Family Medicine Teaching Service Daily Progress Note Intern Pager: [phone]  Patient name: Wanda Baker Medical record number: [id number] Date of birth: 04-12-1961 Age: 56 y.o. Gender: female  Primary Care Provider: Lockie Pares, MD Consultants: General Surgery, Gastroenterology  Code Status: Full  Pt Overview and Major Events to Date:  04/02: Admit for complete colonic obstruction concerning for mass, GI consulted 04/03: Fleet enema with successful passage of watery brown stool, sigmoidoscopy shows 15 cm rectosigmoid mass concerning for adenocarcinoma 04/04: Resection of colonic mass with diverting colostomy 04/05: NG tube with ice chips  Assessment and Plan: EMELEE RODOCKER is a 56 y.o. female presenting with 4 days of obstipation and nausea without vomiting. PMH is significant for HTN, NIDDM, HLD, GERD, s/p x2 C-sections and abdominal hysterectomy in 2011 due to fibroids. Of note, patient has no prior history of colonoscopy. Negative family history for cancer.   #Acute Obstipation  Nausea without Vomiting: CT abdomen/pelvis concerning for distal colonic mass. GI following. Onset acute per patient history over 4 days. Normal formed stools prior to sudden onset of lower abdominal pain and nausea with distention of abdomen. Has history of abdominal surgery. No h/o colonoscopy. FHx neg for colon cancer. Has history of GI bleeds. Not on anticoagulants. No history of constipation or laxative use. --GI consulted, appreciate recommendations --General surgery consulted, appreciate recommendations --Zofran PRN nausea --IV Protonix 40 mg QD --Morphine 1-4 mg q4h PRN for pain --Advance diet as tolerated  #Rectosigmoid Mass  Diverting Colostomy: Postop day #1. Low anterior colon resection with colostomy. Mass removed without signs of malignancy. --See plan above --Wound care consulted for ostomy care --Foley cath in place with plans for removal 4/6  #Acute Blood Loss Anemia: Stable. Drop  in hemoglobin from 13.1 > 10.9 now stabilized at 10.0. Transfusion threshold <7. --Plan per above or rectosigmoid mass  #Tachycardia  Hypertension: Normotensive. Remains tachycardic in the low 100s. Fever 100.4 F since surgery without tachypnea. Has leukocytosis. On ACE-I and thiazide for BP control.  --Consider postop complications for fevers --Holding lisinopril 5 mg BID and HCTZ 25 mg QD  #Non-insulin-dependent diabetes mellitus: Chronic. Controlled. A1c 5.7 on admission. --Holding metformin 500 mg BID --CBG daily   #Hyperlipidemia: Chronic. On statin therapy. --Holding Lipitor 20 mg QD  #GERD: Chronic. Controlled on PPI. --Protonix 40 mg IV, consider switching to Prilosec 20 mg QD as diet is tolerated  FEN/GI: IV Protonix, NPO with ice chips Prophylaxis: Lovenox SQ  Disposition: Pending recovery s/p surgical removal of colonic mass with diverting colostomy.  Subjective:  Patient says she is doing well since operation. Eating ice chips and excited to get NG tube out tomorrow and advance diet.  Objective: Temp:  [97.5 F (36.4 C)-100.4 F (38 C)] 99 F (37.2 C) (04/05 0530) Pulse Rate:  [92-136] 110 (04/05 0600) Resp:  [11-20] 16 (04/05 0530) BP: (108-135)/(57-87) 112/61 (04/05 0530) SpO2:  [95 %-100 %] 98 % (04/05 0530) Physical Exam: General: well nourished, well developed, in no acute distress with non-toxic appearance HEENT: normocephalic, atraumatic, moist mucous membranes, NG tube intact with clear gastric fluid CV: regular rate and rhythm without murmurs, rubs, or gallops Lungs: clear to auscultation bilaterally, no wheezing with normal work of breathing Abdomen: soft, minimally distended, minimal tenderness to palpation near site of incision without erythema or drainage, dressings intact, colostomy intact without bleeding or erythema, normoactive bowel sounds appreciated GU: foley catheter in place with clear yellow fluid Skin: warm, dry, no rashes or lesions,  cap refill <  2 seconds Extremities: warm and well perfused, normal tone, non-edematous  Laboratory:  Recent Labs Lab 09/18/16 0659 09/18/16 1559 09/19/16 0329  WBC 12.6* 15.7* 17.3*  HGB 10.7* 10.5* 10.0*  HCT 34.3* 33.5* 31.8*  PLT 428* 435* 424*    Recent Labs Lab 09/16/16 0930  09/18/16 0659 09/18/16 1559 09/19/16 0329  NA 135  < > 141 139 138  K 3.0*  < > 4.1 4.1 3.9  CL 97*  < > 110 109 106  CO2 22  < > 21* 20* 23  BUN 21*  < > 11 9 9   CREATININE 0.60  < > 0.44 0.58 0.63  CALCIUM 9.3  < > 8.6* 8.2* 8.1*  PROT 7.3  --   --   --   --   BILITOT 1.2  --   --   --   --   ALKPHOS 76  --   --   --   --   ALT 17  --   --   --   --   AST 19  --   --   --   --   GLUCOSE 107*  < > 78 105* 100*  < > = values in this interval not displayed. Lipase: <10 HIV: Nonreactive A1c: 5.7 (WNL)  CEA: 8.5 (H)  Imaging/Diagnostic Tests: CT Abdomen Pelvis W Contrast (09/16/2016) FINDINGS: Lower chest: Dependent atelectasis is seen in the lung bases. No pleural or pericardial effusion. Hepatobiliary: The gallbladder is mildly distended. The liver and biliary tree appear normal. Pancreas: Unremarkable. No pancreatic ductal dilatation or surrounding inflammatory changes. Spleen: Normal in size without focal abnormality. Adrenals/Urinary Tract: Adrenal glands are unremarkable. Kidneys are normal, without renal calculi, focal lesion, or hydronephrosis. Bladder is unremarkable. Stomach/Bowel: There is marked distention of the colon with gas and stool to the distal sigmoid where the colon appears focally narrowed with an appearance worrisome for a mass lesion. The stomach, small bowel and appendix appear normal. Vascular/Lymphatic: Scattered aortoiliac atherosclerosis without aneurysm is identified. No lymphadenopathy. Reproductive: Status post hysterectomy. No adnexal masses. Other: No abdominal wall hernia or abnormality. No abdominopelvic ascites. Musculoskeletal: No acute abnormality. No  lytic or sclerotic lesion.  IMPRESSION: Findings worrisome for a mass lesion in the distal sigmoid colon causing colonic obstruction. No evidence of metastatic disease identified. Atherosclerosis. Status post hysterectomy.  DG Abd Portable 1V (09/16/2016) FINDINGS: Nasogastric tube is identified distal tip probably at the GE junction. Advancement is recommended. Air-filled distended colon is identified.  IMPRESSION: Nasogastric tube is identified distal tip probably at the GE junction, advancement is recommended. Air-filled distended colon is identified.  [place] Bing, DO 09/19/2016, 8:15 AM PGY-1, [facility] Intern pager: [phone], text pages welcome

## 2016-09-19 NOTE — Consult Note (Addendum)
[place] Nurse wound consult note Pt is followed by the surgical team for assessment and plan of care. Reason for Consult: Consult requested for Vac application to abd wound Wound type: Full thickness post-op wound to midline abd Measurement: 19X5.5X3.5cm Wound bed: beefy red Drainage (amount, consistency, odor) small amt pink drainage, no odor Periwound: intact skin surrounding Dressing procedure/placement/frequency: Applied one piece black foam to cont suction at 153mm.  Pt tolerated with mod amt discomfort after pain medication given earlier.  Plan for bedside nurse to change on Sat/Tues/Thurs.  [place] Nurse ostomy consult note Stoma type/location: Colostomy surgery performed yesterday.  Current pouch is leaking behind the barrier Stomal assessment/size: Stoma red and viable, flush with skin level, 1 3/4 inches and oval Peristomal assessment: Intact skin surrounding Treatment options for stomal/peristomal skin: Barrier ring applied to assist with maintaining a seal, and 2 piece pouch Outpu: Mod amt brown liquid stool  Ostomy pouching: 2pc.  Education provided:  Demonstrated pouch change to patient and opening and closing velcro to empty.  Will perform further educational sessions when she is feeling better.  She asked appropriate questions. Enrolled patient in [place] program: No Julien Girt MSN, [place], [place], [place], [place]

## 2016-09-19 NOTE — Telephone Encounter (Signed)
Received a call from Dr. Yisroel Ramming to schedule an appt w/Dr. Benay Spice on 4/17 at 2pm. Pt is currently in the hospital. New dx: adenocarcinoma of colon. Will give the appt to the pt once she's discharged from the hospital. Letter mailed.

## 2016-09-20 LAB — BASIC METABOLIC PANEL
Anion gap: 11 (ref 5–15)
BUN: 5 mg/dL — ABNORMAL LOW (ref 6–20)
CALCIUM: 8 mg/dL — AB (ref 8.9–10.3)
CO2: 24 mmol/L (ref 22–32)
CREATININE: 0.53 mg/dL (ref 0.44–1.00)
Chloride: 102 mmol/L (ref 101–111)
Glucose, Bld: 82 mg/dL (ref 65–99)
Potassium: 3.3 mmol/L — ABNORMAL LOW (ref 3.5–5.1)
SODIUM: 137 mmol/L (ref 135–145)

## 2016-09-20 LAB — CBC
HCT: 26.7 % — ABNORMAL LOW (ref 36.0–46.0)
Hemoglobin: 8.5 g/dL — ABNORMAL LOW (ref 12.0–15.0)
MCH: 25.2 pg — ABNORMAL LOW (ref 26.0–34.0)
MCHC: 31.8 g/dL (ref 30.0–36.0)
MCV: 79.2 fL (ref 78.0–100.0)
PLATELETS: 334 10*3/uL (ref 150–400)
RBC: 3.37 MIL/uL — ABNORMAL LOW (ref 3.87–5.11)
RDW: 14.5 % (ref 11.5–15.5)
WBC: 13.6 10*3/uL — ABNORMAL HIGH (ref 4.0–10.5)

## 2016-09-20 LAB — MAGNESIUM: MAGNESIUM: 1.7 mg/dL (ref 1.7–2.4)

## 2016-09-20 LAB — GLUCOSE, CAPILLARY
GLUCOSE-CAPILLARY: 87 mg/dL (ref 65–99)
Glucose-Capillary: 93 mg/dL (ref 65–99)

## 2016-09-20 MED ORDER — KCL IN DEXTROSE-NACL 40-5-0.9 MEQ/L-%-% IV SOLN
INTRAVENOUS | Status: DC
  Administered 2016-09-20: 09:00:00 via INTRAVENOUS
  Filled 2016-09-20: qty 1000

## 2016-09-20 MED ORDER — POTASSIUM CHLORIDE 2 MEQ/ML IV SOLN
INTRAVENOUS | Status: DC
  Administered 2016-09-20 – 2016-09-23 (×7): via INTRAVENOUS
  Filled 2016-09-20 (×15): qty 1000

## 2016-09-20 MED ORDER — SODIUM CHLORIDE 0.9 % IV SOLN
30.0000 meq | Freq: Once | INTRAVENOUS | Status: AC
  Administered 2016-09-20: 30 meq via INTRAVENOUS
  Filled 2016-09-20: qty 15

## 2016-09-20 NOTE — Progress Notes (Signed)
2 Days Post-Op  Subjective: CC:   Neoplasm of rectum with complete obstruction  Wants to know when she can use it NG tube removed. Some drainage, about a half canister right now. She has some stool and gas in the ostomy bag. Wound VAC in the open abdominal wound.    Objective: Vital signs in last 24 hours: Temp:  [98 F (36.7 C)-98.8 F (37.1 C)] 98.8 F (37.1 C) (04/06 0429) Pulse Rate:  [120-135] 122 (04/06 0429) Resp:  [17-18] 18 (04/06 0429) BP: (111-137)/(64-69) 137/69 (04/06 0429) SpO2:  [93 %-97 %] 94 % (04/06 0429) Last BM Date: 09/19/16 PO 100 375 IV Urine 2350 NG 400 Drain 130 Stool 100 Afebrile, VSS K+ 3.3, WBC is up 13.6, trending down  Intake/Output from previous day: 04/05 0701 - 04/06 0700 In: 580 [P.O.:100; I.V.:375; IV Piggyback:105] Out: 2980 [Urine:2350; Emesis/NG output:400; Drains:130; Stool:100] Intake/Output this shift: No intake/output data recorded.  General appearance: alert, cooperative and no distress Resp: clear to auscultation bilaterally Cardio: regular rate and rhythm, S1, S2 normal, no murmur, click, rub or gallop GI: Soft, sore, wound VAC in place, few bowel sounds. There is some stool and gas in the ostomy bag.  Lab Results:   Recent Labs  09/19/16 2255 09/20/16 0251  WBC 14.5* 13.6*  HGB 8.5* 8.5*  HCT 26.6* 26.7*  PLT 341 334    BMET  Recent Labs  09/19/16 0329 09/20/16 0251  NA 138 137  K 3.9 3.3*  CL 106 102  CO2 23 24  GLUCOSE 100* 82  BUN 9 5*  CREATININE 0.63 0.53  CALCIUM 8.1* 8.0*   PT/INR No results for input(s): LABPROT, INR in the last 72 hours.   Recent Labs Lab 09/16/16 0930  AST 19  ALT 17  ALKPHOS 76  BILITOT 1.2  PROT 7.3  ALBUMIN 3.8     Lipase     Component Value Date/Time   LIPASE <10 (L) 09/16/2016 0930     Studies/Results: No results found. Prior to Admission medications   Medication Sig Start Date End Date Taking? Authorizing Provider  acetaminophen (TYLENOL) 500 MG  tablet Take 1,000 mg by mouth every 6 (six) hours as needed for mild pain or moderate pain.   Yes Historical Provider, MD  cetirizine (ZYRTEC) 10 MG tablet Take 10 mg by mouth daily.   Yes Historical Provider, MD  esomeprazole (NEXIUM) 20 MG capsule Take 20 mg by mouth daily at 12 noon.   Yes Historical Provider, MD  hydrochlorothiazide (HYDRODIURIL) 25 MG tablet TAKE 1 TABLET BY MOUTH DAILY 08/29/16  Yes Carlyle Dolly, MD  lisinopril (PRINIVIL,ZESTRIL) 5 MG tablet TAKE 2 TABLETS BY MOUTH EVERY DAY 09/04/16  Yes Asiyah Cletis Media, MD  metFORMIN (GLUCOPHAGE) 500 MG tablet TAKE 1 TABLET BY MOUTH TWICE DAILY WITH A MEAL 07/18/16  Yes Asiyah Cletis Media, MD  atorvastatin (LIPITOR) 20 MG tablet TAKE 1 TABLET BY MOUTH EVERY DAY Patient not taking: Reported on 09/16/2016 05/26/14   Tamela Oddi Hess, DO  montelukast (SINGULAIR) 10 MG tablet TAKE 1 TABLET BY MOUTH AT BEDTIME Patient not taking: Reported on 09/16/2016    Nolon Rod, DO  Omega-3 Fatty Acids (FISH OIL CONCENTRATE) 1000 MG CAPS Take 1,000 mg by mouth daily.     Historical Provider, MD  omeprazole (PRILOSEC) 20 MG capsule TAKE ONE CAPSULE BY MOUTH EVERY DAY Patient not taking: Reported on 09/16/2016 02/17/14   Tamela Oddi Hess, DO  triamcinolone (NASACORT AQ) 55 MCG/ACT nasal inhaler Place  2 sprays into the nose daily. Patient not taking: Reported on 09/16/2016 10/29/12   Donnamae Jude, MD    Medications: . cefoTEtan (CEFOTAN) IV  2 g Intravenous Q12H  . enoxaparin (LOVENOX) injection  40 mg Subcutaneous Q24H  . pantoprazole  40 mg Oral Q0600   . lactated ringers with kcl 125 mL/hr at 09/20/16 8144   Assessment/Plan  Neoplasm of rectum with complete obstruction Status post exploratory laparotomy, low anterior resection of rectal mass. Diverting colostomy, 09/18/16, Dr. Fanny Skates  POD 2 Hypertension Type 2 diabetes GERD Hyperlipidemia Status post C-section 2/abdominal hysterectomy FEN:  IV fluids/NPO x ice chips ID:  Cefotetan 2 gm q12h   09/18/16, =>> Day 3 DVT:  Lovenox    Plan: Clamping trials of the NG. Foley is out. She is up in the chair. Begin ambulation.  Check magnesium, decrease IV fluids. Increase potassium and IV fluids.  BMP IN AM.  LOS: 4 days    Delorse Shane 09/20/2016 [phone]

## 2016-09-20 NOTE — Care Management Note (Signed)
Case Management Note  Patient Details  Name: Wanda Baker MRN: [id number] Date of Birth: [birth year]  Subjective/Objective:  Pt admitted on 09/16/16 with nausea, rectosigmoid mass s/p low anterior colon resection with colostomy.  PTA, pt independent, lives with mother.                    Action/Plan: Will follow for discharge planning as pt progresses.  May need HHRN at dc for colostomy teaching, wound care.   Expected Discharge Date:                  Expected Discharge Plan:  [place]  In-House Referral:     Discharge planning Services  CM Consult  Post Acute Care Choice:    Choice offered to:     DME Arranged:    DME Agency:     HH Arranged:    [place] Agency:     Status of Service:  In process, will continue to follow  If discussed at Long Length of Stay Meetings, dates discussed:    Additional Comments:  Reinaldo Raddle, RN, BSN  Trauma/Neuro ICU Case Manager [phone]

## 2016-09-20 NOTE — Anesthesia Postprocedure Evaluation (Addendum)
Anesthesia Post Note  Patient: Wanda Baker  Procedure(s) Performed: Procedure(s) (LRB): FLEXIBLE SIGMOIDOSCOPY (N/A)  Patient location during evaluation: Endoscopy Anesthesia Type: MAC Level of consciousness: awake and alert Pain management: pain level controlled Vital Signs Assessment: post-procedure vital signs reviewed and stable Respiratory status: spontaneous breathing, nonlabored ventilation, respiratory function stable and patient connected to nasal cannula oxygen Cardiovascular status: stable and blood pressure returned to baseline Anesthetic complications: no       Last Vitals:  Vitals:   09/19/16 2205 09/20/16 0429  BP:  137/69  Pulse: (!) 120 (!) 122  Resp:  18  Temp:  37.1 C    Last Pain:  Vitals:   09/20/16 0803  TempSrc:   PainSc: 6                  Kristol Almanzar,JAMES TERRILL

## 2016-09-20 NOTE — Progress Notes (Signed)
Family Medicine Teaching Service Daily Progress Note Intern Pager: [phone]  Patient name: Wanda Baker Medical record number: [id number] Date of birth: January 01, 1961 Age: 56 y.o. Gender: female  Primary Care Provider: Lockie Pares, MD Consultants: General Surgery, Gastroenterology  Code Status: Full  Pt Overview and Major Events to Date:  04/02: Admit for complete colonic obstruction concerning for mass, GI consulted 04/03: Fleet enema with successful passage of watery brown stool, sigmoidoscopy shows 15 cm rectosigmoid mass concerning for adenocarcinoma 04/04: Resection of colonic mass with diverting colostomy 04/05: NG tube with ice chips 04/06: Clamp NG  Assessment and Plan: Wanda Baker is a 56 y.o. female presenting with 4 days of obstipation and nausea without vomiting. PMH is significant for HTN, NIDDM, HLD, GERD, s/p x2 C-sections and abdominal hysterectomy in 2011 due to fibroids. Of note, patient has no prior history of colonoscopy. Negative family history for cancer.   #Acute Obstipation  Nausea without Vomiting: CT abdomen/pelvis concerning for distal colonic mass. Has history of abdominal surgery. No h/o colonoscopy. FHx neg for colon cancer. Has history of GI bleeds. Not on anticoagulants. No history of constipation or laxative use. --GI consulted, appreciate recommendations --General surgery consulted, appreciate recommendations: NG clamp, advance diet as tolerated, encourage ambulation --Zofran PRN nausea --IV Protonix 40 mg QD --Morphine 1-4 mg q4h PRN for pain --On LR@125  mL/hour +20 mEq KCl, adding additional K-run with 30 more mEq  #Rectosigmoid Mass  Diverting Colostomy: Postop day #2. Low anterior colon resection with colostomy. Mass removed without signs of malignancy. --See plan above --Wound care consulted for ostomy care  #Acute Blood Loss Anemia: Stable. Drop in hemoglobin from 13.1 > 10.9 > 8.5 now stabilized at 8.5. Transfusion threshold <7. --Plan  per above for rectosigmoid mass  #Tachycardia  Hypertension: Normotensive. Remains tachycardic in the 120-130s. Afebrile for >24 hours since surgery without tachypnea. Has leukocytosis. On ACE-I and thiazide for BP control.  --On IV Cefotan (day 3, 4/4-) --Holding lisinopril 5 mg BID and HCTZ 25 mg QD  #Non-insulin-dependent diabetes mellitus: Chronic. Controlled. A1c 5.7 on admission. --Holding metformin 500 mg BID --CBG daily   #Hyperlipidemia: Chronic. On statin therapy. --Holding Lipitor 20 mg QD  #GERD: Chronic. Controlled on PPI. --Protonix 40 mg IV, consider switching to Prilosec 20 mg QD as diet is tolerated  FEN/GI: IV Protonix, NPO with ice chips Prophylaxis: Lovenox SQ  Disposition: Pending recovery s/p surgical removal of colonic mass with diverting colostomy.  Subjective:  Patient says she feels good hoping to advance her diet later today. Once the NG tube removed. Denies chest pain or shortness of breath. Maintain appetite without nausea.  Objective: Temp:  [98 F (36.7 C)-98.8 F (37.1 C)] 98.8 F (37.1 C) (04/06 0429) Pulse Rate:  [120-135] 122 (04/06 0429) Resp:  [17-18] 18 (04/06 0429) BP: (111-137)/(64-69) 137/69 (04/06 0429) SpO2:  [93 %-97 %] 94 % (04/06 0429) Physical Exam: General: well nourished, well developed, in no acute distress with non-toxic appearance HEENT: normocephalic, atraumatic, moist mucous membranes, NG tube intact with clear gastric fluid CV: regular rate and rhythm without murmurs, rubs, or gallops Lungs: clear to auscultation bilaterally, no wheezing with normal work of breathing Abdomen: soft, minimally distended, minimal tenderness to palpation near site of incision without erythema or drainage, dressings intact, colostomy intact without bleeding or erythema, normoactive bowel sounds appreciated Skin: warm, dry, no rashes or lesions, cap refill < 2 seconds Extremities: warm and well perfused, normal tone,  non-edematous  Laboratory:  Recent Labs Lab  09/19/16 0329 09/19/16 2255 09/20/16 0251  WBC 17.3* 14.5* 13.6*  HGB 10.0* 8.5* 8.5*  HCT 31.8* 26.6* 26.7*  PLT 424* 341 334    Recent Labs Lab 09/16/16 0930  09/18/16 1559 09/19/16 0329 09/20/16 0251  NA 135  < > 139 138 137  K 3.0*  < > 4.1 3.9 3.3*  CL 97*  < > 109 106 102  CO2 22  < > 20* 23 24  BUN 21*  < > 9 9 5*  CREATININE 0.60  < > 0.58 0.63 0.53  CALCIUM 9.3  < > 8.2* 8.1* 8.0*  PROT 7.3  --   --   --   --   BILITOT 1.2  --   --   --   --   ALKPHOS 76  --   --   --   --   ALT 17  --   --   --   --   AST 19  --   --   --   --   GLUCOSE 107*  < > 105* 100* 82  < > = values in this interval not displayed. Lipase: <10 HIV: Nonreactive A1c: 5.7 (WNL)  CEA: 8.5 (H) Mag: 1.7 (WNL)  Imaging/Diagnostic Tests: DG Chest 2 View (09/17/2016) FINDINGS: Postsurgical changes of the lower cervical spine. Mildly diminished lung volumes. Mild bibasilar atelectasis. Borderline cardiomegaly, augmented by low lung volume. No pneumothorax. Dilated bowel in the upper abdomen.  IMPRESSION: 1. Low lung volumes with bibasilar atelectasis 2. Borderline cardiomegaly, exaggerated by low inspiration.  CT Abdomen Pelvis W Contrast (09/16/2016) FINDINGS: Lower chest: Dependent atelectasis is seen in the lung bases. No pleural or pericardial effusion. Hepatobiliary: The gallbladder is mildly distended. The liver and biliary tree appear normal. Pancreas: Unremarkable. No pancreatic ductal dilatation or surrounding inflammatory changes. Spleen: Normal in size without focal abnormality. Adrenals/Urinary Tract: Adrenal glands are unremarkable. Kidneys are normal, without renal calculi, focal lesion, or hydronephrosis. Bladder is unremarkable. Stomach/Bowel: There is marked distention of the colon with gas and stool to the distal sigmoid where the colon appears focally narrowed with an appearance worrisome for a mass lesion. The stomach,  small bowel and appendix appear normal. Vascular/Lymphatic: Scattered aortoiliac atherosclerosis without aneurysm is identified. No lymphadenopathy. Reproductive: Status post hysterectomy. No adnexal masses. Other: No abdominal wall hernia or abnormality. No abdominopelvic ascites. Musculoskeletal: No acute abnormality. No lytic or sclerotic lesion.  IMPRESSION: Findings worrisome for a mass lesion in the distal sigmoid colon causing colonic obstruction. No evidence of metastatic disease identified. Atherosclerosis. Status post hysterectomy.  DG Abd Portable 1V (09/16/2016) FINDINGS: Nasogastric tube is identified distal tip probably at the GE junction. Advancement is recommended. Air-filled distended colon is identified.  IMPRESSION: Nasogastric tube is identified distal tip probably at the GE junction, advancement is recommended. Air-filled distended colon is identified.  [place]au[place]en[place] Bing, DO 09/20/2016, 8:22 AM PGY-1, [facility] Intern pager: [phone], text pages welcome

## 2016-09-21 LAB — BASIC METABOLIC PANEL
ANION GAP: 13 (ref 5–15)
BUN: <5 mg/dL — ABNORMAL LOW (ref 6–20)
CHLORIDE: 99 mmol/L — AB (ref 101–111)
CO2: 25 mmol/L (ref 22–32)
Calcium: 8.6 mg/dL — ABNORMAL LOW (ref 8.9–10.3)
Creatinine, Ser: 0.42 mg/dL — ABNORMAL LOW (ref 0.44–1.00)
GFR calc Af Amer: >60 mL/min (ref >60)
GFR calc non Af Amer: >60 mL/min (ref >60)
GLUCOSE: 80 mg/dL (ref 65–99)
POTASSIUM: 4.2 mmol/L (ref 3.5–5.1)
Sodium: 137 mmol/L (ref 135–145)

## 2016-09-21 LAB — GLUCOSE, CAPILLARY: Glucose-Capillary: 85 mg/dL (ref 65–99)

## 2016-09-21 MED ORDER — TRAMADOL HCL 50 MG PO TABS: 50.0000 mg | ORAL_TABLET | Freq: Four times a day (QID) | ORAL | Status: DC | PRN

## 2016-09-21 MED ORDER — METHOCARBAMOL 500 MG PO TABS
500.0000 mg | ORAL_TABLET | Freq: Four times a day (QID) | ORAL | Status: DC | PRN
  Administered 2016-09-21: 500 mg via ORAL
  Filled 2016-09-21: qty 1

## 2016-09-21 MED ORDER — ACETAMINOPHEN 500 MG PO TABS
1000.0000 mg | ORAL_TABLET | Freq: Four times a day (QID) | ORAL | Status: DC
  Administered 2016-09-21: 1000 mg via ORAL
  Filled 2016-09-21 (×3): qty 2

## 2016-09-21 MED ORDER — IBUPROFEN 600 MG PO TABS
600.0000 mg | ORAL_TABLET | Freq: Four times a day (QID) | ORAL | Status: DC | PRN
  Administered 2016-09-21: 600 mg via ORAL
  Filled 2016-09-21 (×2): qty 1

## 2016-09-21 NOTE — Progress Notes (Signed)
3 Days Post-Op  Subjective:  CC:  Neoplasm of rectum with complete obstruction She is up some flatus in the bag and some stool.  Nothing from the NG, will pull and start her on clears.    Objective: Vital signs in last 24 hours: Temp:  [98.3 F (36.8 C)-99.1 F (37.3 C)] 98.3 F (36.8 C) (04/07 0357) Pulse Rate:  [100-119] 100 (04/07 0357) Resp:  [20] 20 (04/07 0357) BP: (129-150)/(64-75) 129/64 (04/07 0357) SpO2:  [96 %-99 %] 99 % (04/07 0357) Last BM Date: 09/20/16 Nothing PO  50 from NG 2277 IV 2450 urine. Afebrile, VSS HR up some K+ 4.2 Intake/Output from previous day: 04/06 0701 - 04/07 0700 In: 2277.1 [I.V.:2227.1; IV Piggyback:50] Out: 2520 [Urine:2450; Emesis/NG output:50; Drains:20] Intake/Output this shift: No intake/output data recorded.  General appearance: alert, cooperative and no distress Resp: clear to auscultation bilaterally GI: soft sore, few BS, stool and gas in ostomy.   Wound vac in place will look at wound with dressing change  Lab Results:   Recent Labs  09/19/16 2255 09/20/16 0251  WBC 14.5* 13.6*  HGB 8.5* 8.5*  HCT 26.6* 26.7*  PLT 341 334    BMET  Recent Labs  09/20/16 0251 09/21/16 0506  NA 137 137  K 3.3* 4.2  CL 102 99*  CO2 24 25  GLUCOSE 82 80  BUN 5* <5*  CREATININE 0.53 0.42*  CALCIUM 8.0* 8.6*   PT/INR No results for input(s): LABPROT, INR in the last 72 hours.   Recent Labs Lab 09/16/16 0930  AST 19  ALT 17  ALKPHOS 76  BILITOT 1.2  PROT 7.3  ALBUMIN 3.8     Lipase     Component Value Date/Time   LIPASE <10 (L) 09/16/2016 0930     Studies/Results: No results found. Prior to Admission medications   Medication Sig Start Date End Date Taking? Authorizing Provider  acetaminophen (TYLENOL) 500 MG tablet Take 1,000 mg by mouth every 6 (six) hours as needed for mild pain or moderate pain.   Yes Historical Provider, MD  cetirizine (ZYRTEC) 10 MG tablet Take 10 mg by mouth daily.   Yes Historical  Provider, MD  esomeprazole (NEXIUM) 20 MG capsule Take 20 mg by mouth daily at 12 noon.   Yes Historical Provider, MD  hydrochlorothiazide (HYDRODIURIL) 25 MG tablet TAKE 1 TABLET BY MOUTH DAILY 08/29/16  Yes Carlyle Dolly, MD  lisinopril (PRINIVIL,ZESTRIL) 5 MG tablet TAKE 2 TABLETS BY MOUTH EVERY DAY 09/04/16  Yes Asiyah Cletis Media, MD  metFORMIN (GLUCOPHAGE) 500 MG tablet TAKE 1 TABLET BY MOUTH TWICE DAILY WITH A MEAL 07/18/16  Yes Asiyah Cletis Media, MD  atorvastatin (LIPITOR) 20 MG tablet TAKE 1 TABLET BY MOUTH EVERY DAY Patient not taking: Reported on 09/16/2016 05/26/14   Tamela Oddi Hess, DO  montelukast (SINGULAIR) 10 MG tablet TAKE 1 TABLET BY MOUTH AT BEDTIME Patient not taking: Reported on 09/16/2016    Nolon Rod, DO  Omega-3 Fatty Acids (FISH OIL CONCENTRATE) 1000 MG CAPS Take 1,000 mg by mouth daily.     Historical Provider, MD  omeprazole (PRILOSEC) 20 MG capsule TAKE ONE CAPSULE BY MOUTH EVERY DAY Patient not taking: Reported on 09/16/2016 02/17/14   Tamela Oddi Hess, DO  triamcinolone (NASACORT AQ) 55 MCG/ACT nasal inhaler Place 2 sprays into the nose daily. Patient not taking: Reported on 09/16/2016 10/29/12   Donnamae Jude, MD    Medications: . cefoTEtan (CEFOTAN) IV  2 g Intravenous Q12H  .  enoxaparin (LOVENOX) injection  40 mg Subcutaneous Q24H  . pantoprazole  40 mg Oral Q0600   . lactated ringers 1,000 mL with potassium chloride 40 mEq infusion 125 mL/hr at 09/21/16 0630   Assessment/Plan Neoplasm of rectum with complete obstruction Status post exploratory laparotomy, low anterior resection of rectal mass. Diverting colostomy, 09/18/16, Dr. Fanny Skates  POD 2 Hypertension Type 2 diabetes GERD Hyperlipidemia Status post C-section 2/abdominal hysterectomy FEN:  IV fluids/NPO x ice chips +>> pull NG and start clears ID:  Cefotetan 2 gm q12h  09/18/16, =>> Day 4 DVT:  Lovenox     Plan: pull NG, sips of clears , continue to mobilize.   She's not sure what her reactions are  with pain meds.  Codeine makes her drunk, allergic to vicodin, Ask for tylenol and muscle relaxer.  I will also add Tramadol.  LOS: 5 days    Cephus Tupy 09/21/2016 [phone]

## 2016-09-21 NOTE — Progress Notes (Signed)
Changed colostomy pouch, JP drain dressing and VAC dressing.  Wound is 19 cm long, 4 cm across at widest point in the middle and 2 cm deep at the deepest area.  Suction at 125.  Seals intact.

## 2016-09-21 NOTE — Progress Notes (Signed)
Pt for home care wound vac, consult wound care and case manager.

## 2016-09-21 NOTE — Progress Notes (Signed)
Open wound with wound vac looks good. Will continue and set up home health.

## 2016-09-21 NOTE — Progress Notes (Signed)
Family Medicine Teaching Service Daily Progress Note Intern Pager: [phone]  Patient name: Wanda Baker Medical record number: [id number] Date of birth: [birth year] Age: 56 y.o. Gender: female  Primary Care Provider: Lockie Pares, MD Consultants: General Surgery, Gastroenterology  Code Status: Full  Pt Overview and Major Events to Date:  04/02: Admit for complete colonic obstruction concerning for mass, GI consulted 04/03: Fleet enema with successful passage of watery brown stool, sigmoidoscopy shows 15 cm rectosigmoid mass concerning for adenocarcinoma 04/04: Resection of colonic mass with diverting colostomy 04/05: NG tube with ice chips 04/06: Clamp NG 4/7: NG tube pulled  Assessment and Plan: Wanda Baker is a 56 y.o. female presenting with 4 days of obstipation and nausea without vomiting. PMH is significant for HTN, NIDDM, HLD, GERD, s/p x2 C-sections and abdominal hysterectomy in 2011 due to fibroids. Of note, patient has no prior history of colonoscopy. Negative family history for cancer.  #Rectosigmoid Mass s/p Diverting Colostomy: Postop day #3. Low anterior colon resection with colostomy. Mass removed without signs of malignancy. --Wound care consulted for ostomy care --General surgery consulted, appreciate recommendations: NG pulled, sips of clears, encourage ambulation --Zofran PRN nausea --Morphine 1-4 mg q4h PRN, tramadol 50-100mg  q6h prn for pain --On IV Cefotan (day 4, 4/4-) per surgery  #Acute Blood Loss Anemia: Stable. Drop in hemoglobin from 13.1 > 10.9 > 8.5 now stabilized at 8.5. Transfusion threshold <7. --Monitor, daily CBC - AM repeat pending  #Hypertension: Normotensive. Tachycardia resolved this AM. Afebrile for >24 hours since surgery without tachypnea. Has leukocytosis. On ACE-I and thiazide for BP control.  --Holding lisinopril 5 mg BID and HCTZ 25 mg QD  #Non-insulin-dependent diabetes mellitus: Chronic. Controlled. A1c 5.7 on  admission. --Holding metformin 500 mg BID --CBG daily   #Hyperlipidemia: Chronic. On statin therapy. --Holding Lipitor 20 mg QD until able to take PO  #GERD: Chronic. Controlled on PPI. --Protonix 40 mg PO daily  FEN/GI: IV Protonix, sips of clears Prophylaxis: Lovenox SQ  Disposition: Pending recovery s/p surgical removal of colonic mass with diverting colostomy.  Subjective:  NG tube removed. Tolerating liquids well. No CP, SOB.  Some abd pain after surgery.  Does not want to take any narcotics  Objective: Temp:  [98.3 F (36.8 C)-99.1 F (37.3 C)] 98.3 F (36.8 C) (04/07 0357) Pulse Rate:  [100-119] 100 (04/07 0357) Resp:  [20] 20 (04/07 0357) BP: (129-150)/(64-75) 129/64 (04/07 0357) SpO2:  [96 %-99 %] 99 % (04/07 0357) Physical Exam: General: well nourished, well developed, NAD HEENT: NCAT, MMM CV: RRR without murmurs, rubs, or gallops Lungs: CTAB, no wheezing with normal work of breathing Abdomen: soft, minimally distended, minimal tenderness to palpation near site of incision without erythema or drainage, dressings intact, colostomy intact without bleeding or erythema, normoactive bowel sounds appreciated Skin: warm, dry, no rashes or lesions, cap refill < 2 seconds Extremities: warm and well perfused, normal tone, non-edematous  Laboratory:  Recent Labs Lab 09/19/16 0329 09/19/16 2255 09/20/16 0251  WBC 17.3* 14.5* 13.6*  HGB 10.0* 8.5* 8.5*  HCT 31.8* 26.6* 26.7*  PLT 424* 341 334    Recent Labs Lab 09/16/16 0930  09/19/16 0329 09/20/16 0251 09/21/16 0506  NA 135  < > 138 137 137  K 3.0*  < > 3.9 3.3* 4.2  CL 97*  < > 106 102 99*  CO2 22  < > 23 24 25   BUN 21*  < > 9 5* <5*  CREATININE 0.60  < > 0.63 0.53 0.42*  CALCIUM 9.3  < > 8.1* 8.0* 8.6*  PROT 7.3  --   --   --   --   BILITOT 1.2  --   --   --   --   ALKPHOS 76  --   --   --   --   ALT 17  --   --   --   --   AST 19  --   --   --   --   GLUCOSE 107*  < > 100* 82 80  < > = values in  this interval not displayed. Lipase: <10 HIV: Nonreactive A1c: 5.7 (WNL)  CEA: 8.5 (H) Mag: 1.7 (WNL)  Imaging/Diagnostic Tests: DG Chest 2 View (09/17/2016) FINDINGS: Postsurgical changes of the lower cervical spine. Mildly diminished lung volumes. Mild bibasilar atelectasis. Borderline cardiomegaly, augmented by low lung volume. No pneumothorax. Dilated bowel in the upper abdomen.  IMPRESSION: 1. Low lung volumes with bibasilar atelectasis 2. Borderline cardiomegaly, exaggerated by low inspiration.  CT Abdomen Pelvis W Contrast (09/16/2016) FINDINGS: Lower chest: Dependent atelectasis is seen in the lung bases. No pleural or pericardial effusion. Hepatobiliary: The gallbladder is mildly distended. The liver and biliary tree appear normal. Pancreas: Unremarkable. No pancreatic ductal dilatation or surrounding inflammatory changes. Spleen: Normal in size without focal abnormality. Adrenals/Urinary Tract: Adrenal glands are unremarkable. Kidneys are normal, without renal calculi, focal lesion, or hydronephrosis. Bladder is unremarkable. Stomach/Bowel: There is marked distention of the colon with gas and stool to the distal sigmoid where the colon appears focally narrowed with an appearance worrisome for a mass lesion. The stomach, small bowel and appendix appear normal. Vascular/Lymphatic: Scattered aortoiliac atherosclerosis without aneurysm is identified. No lymphadenopathy. Reproductive: Status post hysterectomy. No adnexal masses. Other: No abdominal wall hernia or abnormality. No abdominopelvic ascites. Musculoskeletal: No acute abnormality. No lytic or sclerotic lesion.  IMPRESSION: Findings worrisome for a mass lesion in the distal sigmoid colon causing colonic obstruction. No evidence of metastatic disease identified. Atherosclerosis. Status post hysterectomy.  DG Abd Portable 1V (09/16/2016) FINDINGS: Nasogastric tube is identified distal tip probably at the GE junction.  Advancement is recommended. Air-filled distended colon is identified.  IMPRESSION: Nasogastric tube is identified distal tip probably at the GE junction, advancement is recommended. Air-filled distended colon is identified.  Virginia Crews, MD, MPH 09/21/2016, 8:50 AM PGY-3, [place] Intern pager: [phone], text pages welcome

## 2016-09-22 LAB — GLUCOSE, CAPILLARY: Glucose-Capillary: 103 mg/dL — ABNORMAL HIGH (ref 65–99)

## 2016-09-22 MED ORDER — ACETAMINOPHEN-CODEINE #3 300-30 MG PO TABS
1.0000 | ORAL_TABLET | ORAL | Status: DC | PRN
  Administered 2016-09-22 – 2016-09-24 (×5): 2 via ORAL
  Filled 2016-09-22 (×5): qty 2

## 2016-09-22 MED ORDER — ACETAMINOPHEN 325 MG PO TABS: 650.0000 mg | ORAL_TABLET | ORAL | Status: DC | PRN

## 2016-09-22 MED ORDER — SACCHAROMYCES BOULARDII 250 MG PO CAPS
250.0000 mg | ORAL_CAPSULE | Freq: Two times a day (BID) | ORAL | Status: DC
  Administered 2016-09-22 – 2016-09-25 (×7): 250 mg via ORAL
  Filled 2016-09-22 (×7): qty 1

## 2016-09-22 NOTE — Progress Notes (Signed)
4 Days Post-Op  Subjective: CC:  Neoplasm of rectum with complete obstruction She looks really good. She is tolerating clears. She has stool and gas in her ostomy. She is a little slow to move remains extremely sore. Her potassium is up to 4.2 this a.m. Wound VAC is in place. I examined the wound yesterday and it looked quite good.  Objective: Vital signs in last 24 hours: Temp:  [98 F (36.7 C)-98.2 F (36.8 C)] 98 F (36.7 C) (04/08 0513) Pulse Rate:  [97-101] 99 (04/08 0513) Resp:  [18] 18 (04/08 0513) BP: (110-127)/(62-65) 110/62 (04/08 0513) SpO2:  [96 %-100 %] 99 % (04/08 0513) Last BM Date: 09/21/16 1320 PO 1485 IV Urine 2100 Drain 60 Colostomy:  150 Afebrile, VSS K+ up to 4.2 No films Intake/Output from previous day: 04/07 0701 - 04/08 0700 In: 2855.4 [P.O.:1320; I.V.:1485.4; IV Piggyback:50] Out: 2310 [Urine:2100; Drains:60; Stool:150] Intake/Output this shift: No intake/output data recorded.  General appearance: alert, cooperative and no distress Resp: clear to auscultation bilaterally GI: Soft, still very very sore, slow to move needs 2 people to help her get in bed. Wound VAC in place. There is plenty of stool and gas in the ostomy bag.  Lab Results:   Recent Labs  09/19/16 2255 09/20/16 0251  WBC 14.5* 13.6*  HGB 8.5* 8.5*  HCT 26.6* 26.7*  PLT 341 334    BMET  Recent Labs  09/20/16 0251 09/21/16 0506  NA 137 137  K 3.3* 4.2  CL 102 99*  CO2 24 25  GLUCOSE 82 80  BUN 5* <5*  CREATININE 0.53 0.42*  CALCIUM 8.0* 8.6*   PT/INR No results for input(s): LABPROT, INR in the last 72 hours.   Recent Labs Lab 09/16/16 0930  AST 19  ALT 17  ALKPHOS 76  BILITOT 1.2  PROT 7.3  ALBUMIN 3.8     Lipase     Component Value Date/Time   LIPASE <10 (L) 09/16/2016 0930     Studies/Results: No results found. Prior to Admission medications   Medication Sig Start Date End Date Taking? Authorizing Provider  acetaminophen (TYLENOL) 500 MG  tablet Take 1,000 mg by mouth every 6 (six) hours as needed for mild pain or moderate pain.   Yes Historical Provider, MD  cetirizine (ZYRTEC) 10 MG tablet Take 10 mg by mouth daily.   Yes Historical Provider, MD  esomeprazole (NEXIUM) 20 MG capsule Take 20 mg by mouth daily at 12 noon.   Yes Historical Provider, MD  hydrochlorothiazide (HYDRODIURIL) 25 MG tablet TAKE 1 TABLET BY MOUTH DAILY 08/29/16  Yes Carlyle Dolly, MD  lisinopril (PRINIVIL,ZESTRIL) 5 MG tablet TAKE 2 TABLETS BY MOUTH EVERY DAY 09/04/16  Yes Asiyah Cletis Media, MD  metFORMIN (GLUCOPHAGE) 500 MG tablet TAKE 1 TABLET BY MOUTH TWICE DAILY WITH A MEAL 07/18/16  Yes Asiyah Cletis Media, MD  atorvastatin (LIPITOR) 20 MG tablet TAKE 1 TABLET BY MOUTH EVERY DAY Patient not taking: Reported on 09/16/2016 05/26/14   Tamela Oddi Hess, DO  montelukast (SINGULAIR) 10 MG tablet TAKE 1 TABLET BY MOUTH AT BEDTIME Patient not taking: Reported on 09/16/2016    Nolon Rod, DO  Omega-3 Fatty Acids (FISH OIL CONCENTRATE) 1000 MG CAPS Take 1,000 mg by mouth daily.     Historical Provider, MD  omeprazole (PRILOSEC) 20 MG capsule TAKE ONE CAPSULE BY MOUTH EVERY DAY Patient not taking: Reported on 09/16/2016 02/17/14   Tamela Oddi Hess, DO  triamcinolone (NASACORT AQ) 55 MCG/ACT nasal  inhaler Place 2 sprays into the nose daily. Patient not taking: Reported on 09/16/2016 10/29/12   Donnamae Jude, MD    Medications: . acetaminophen  1,000 mg Oral Q6H  . cefoTEtan (CEFOTAN) IV  2 g Intravenous Q12H  . enoxaparin (LOVENOX) injection  40 mg Subcutaneous Q24H  . pantoprazole  40 mg Oral Q0600   . lactated ringers 1,000 mL with potassium chloride 40 mEq infusion 75 mL/hr at 09/21/16 2217   Assessment/Plan Neoplasm of rectum with complete obstruction Status post exploratory laparotomy, low anterior resection of rectal mass. Diverting colostomy, 09/18/16, Dr. Fanny Skates POD 3 Hypertension Type 2 diabetes GERD Hyperlipidemia Status post C-section 2/abdominal  hysterectomy FEN: IV fluids/clear liquids ID: Cefotetan 2 gm q12h 09/18/16, =>>Day 5 DVT: Lovenox  Plan: Advance to full liquids, continue to mobilize. Get her on some oral pain medications. She still on IV antibiotics we'll check on duration for this. Anticipate she'll be ready for discharge in the next 48 hours. Monday I'll have them convert her to Monday Wednesday Friday for her wound VAC.    LOS: 6 days    Earnstine Regal 09/22/2016 [phone]

## 2016-09-22 NOTE — Evaluation (Signed)
Physical Therapy Evaluation Patient Details Name: Wanda Baker MRN: [id number] DOB: 01-04-1961 Today's Date: 09/22/2016   History of Present Illness  Pt is a 56 yo female admitted through ED on 09/16/16 with abdominal discomfort. Pt underwent a surgical procedure on 09/18/16 for a resection of colon mass and colostomy. PMH significant for HTN, DM2, HLD, GERD.   Clinical Impression  Pt presents with the above diagnosis and below deficits. Prior to admission, pt was completely independent and working full time at [employer]. Currently, pt requires Min A for bed mobs due to abdominal discomfort and weakness. Pt will be going home with her mother and two sons who will be able to assist PRN. Pt continues to benefit from continued acute PT services in order to address the below deficits before discharge home.     Follow Up Recommendations Home health PT    Equipment Recommendations  Rolling walker with 5" wheels;3in1 (PT)    Recommendations for Other Services       Precautions / Restrictions Precautions Precautions: None Restrictions Weight Bearing Restrictions: No      Mobility  Bed Mobility Overal bed mobility: Needs Assistance Bed Mobility: Sidelying to Sit;Sit to Sidelying   Sidelying to sit: Min assist     Sit to sidelying: Min assist General bed mobility comments: Min A to bring trunk upright at EOB and to bring LE's into bed when lying down.   Transfers Overall transfer level: Needs assistance Equipment used: Rolling walker (2 wheeled) Transfers: Sit to/from Stand Sit to Stand: Min guard         General transfer comment: cues for hand placement  Ambulation/Gait Ambulation/Gait assistance: Min guard Ambulation Distance (Feet): 100 Feet Assistive device: Rolling walker (2 wheeled) Gait Pattern/deviations: Step-through pattern Gait velocity: decreased Gait velocity interpretation: Below normal speed for age/gender General Gait Details: decreased step length bilaterally,  cues for upright posture.   Stairs            Wheelchair Mobility    Modified Rankin (Stroke Patients Only)       Balance Overall balance assessment: No apparent balance deficits (not formally assessed)                                           Pertinent Vitals/Pain Pain Assessment: 0-10 Pain Score: 3  Pain Location: abdomen Pain Descriptors / Indicators: Aching;Grimacing;Guarding Pain Intervention(s): Monitored during session;Repositioned;Patient requesting pain meds-RN notified    Home Living Family/patient expects to be discharged to:: Private residence Living Arrangements: Children;Parent Available Help at Discharge: Family;Available 24 hours/day Type of Home: House Home Access: Stairs to enter Entrance Stairs-Rails: Right;Left;Can reach both Entrance Stairs-Number of Steps: 4 Home Layout: One level Home Equipment: Walker - 2 wheels      Prior Function Level of Independence: Independent with assistive device(s)         Comments: was completely independent and working prior to surgery. lives with mother and children     Hand Dominance   Dominant Hand: Right    Extremity/Trunk Assessment   Upper Extremity Assessment Upper Extremity Assessment: Overall WFL for tasks assessed    Lower Extremity Assessment Lower Extremity Assessment: Generalized weakness    Cervical / Trunk Assessment Cervical / Trunk Assessment: Normal  Communication   Communication: No difficulties  Cognition Arousal/Alertness: Awake/alert Behavior During Therapy: WFL for tasks assessed/performed Overall Cognitive Status: Within Functional Limits for tasks assessed  General Comments      Exercises     Assessment/Plan    PT Assessment Patient needs continued PT services  PT Problem List Decreased strength;Decreased activity tolerance;Decreased mobility;Pain       PT Treatment Interventions DME  instruction;Gait training;Stair training;Functional mobility training;Therapeutic activities;Therapeutic exercise;Patient/family education    PT Goals (Current goals can be found in the Care Plan section)  Acute Rehab PT Goals Patient Stated Goal: to get back home PT Goal Formulation: With patient Time For Goal Achievement: 09/29/16 Potential to Achieve Goals: Good    Frequency Min 3X/week   Barriers to discharge        Co-evaluation               End of Session Equipment Utilized During Treatment: Gait belt Activity Tolerance: Patient tolerated treatment well Patient left: in bed;with call bell/phone within reach Nurse Communication: Mobility status PT Visit Diagnosis: Muscle weakness (generalized) (M62.81);Difficulty in walking, not elsewhere classified (R26.2)    Time: 0761-5183 PT Time Calculation (min) (ACUTE ONLY): 29 min   Charges:   PT Evaluation $PT Eval Low Complexity: 1 Procedure PT Treatments $Gait Training: 8-22 mins   PT G Codes:        Scheryl Marten PT, DPT  [phone]   Jacqulyn Liner Sloan Leiter 09/22/2016, 2:00 PM

## 2016-09-22 NOTE — Progress Notes (Signed)
Family Medicine Teaching Service Daily Progress Note Intern Pager: [phone]  Patient name: Wanda Baker Medical record number: [id number] Date of birth: 01/22/1961 Age: 56 y.o. Gender: female  Primary Care Provider: Lockie Pares, MD Consultants: General Surgery, Gastroenterology  Code Status: Full  Pt Overview and Major Events to Date:  04/02: Admit for complete colonic obstruction concerning for mass, GI consulted 04/03: Fleet enema with successful passage of watery brown stool, sigmoidoscopy shows 15 cm rectosigmoid mass concerning for adenocarcinoma 04/04: Resection of colonic mass with diverting colostomy 04/05: NG tube with ice chips 04/06: Clamp NG 4/7: NG tube pulled  Assessment and Plan: Wanda Baker is a 56 y.o. female presenting with 4 days of obstipation and nausea without vomiting. PMH is significant for HTN, NIDDM, HLD, GERD, s/p x2 C-sections and abdominal hysterectomy in 2011 due to fibroids. Of note, patient has no prior history of colonoscopy. Negative family history for cancer.  #Invasive adenocarcinoma, s/p Diverting Colostomy: Postop day #4. Low anterior colon resection with colostomy. Mass removed; negative margins; 0/20 lymph node involvement. --Wound care consulted for ostomy care --General surgery consulted, appreciate recommendations: NG pulled, sips of clears, encourage ambulation --Zofran PRN nausea --Morphine 1-4 mg q4h PRN, tramadol 50-100mg  q6h prn for pain --On IV Cefotan (day 5, 4/4-) per surgery  #Acute Blood Loss Anemia: Stable. Drop in hemoglobin from 13.1 > 10.9 > 8.5 now stabilized at 8.5. Transfusion threshold <7. --Monitor, daily CBC - AM repeat pending  #Hypertension: Normotensive. Tachycardia resolved this AM. Afebrile for >24 hours since surgery without tachypnea.  --Holding lisinopril 5 mg BID and HCTZ 25 mg QD  #Non-insulin-dependent diabetes mellitus: Chronic. Controlled. A1c 5.7 on admission. --Holding metformin 500 mg  BID --CBG daily   #Hyperlipidemia: Chronic. On statin therapy. --Holding Lipitor 20 mg QD until able to take PO  #GERD: Chronic. Controlled on PPI. --Protonix 40 mg PO daily  FEN/GI: IV Protonix, sips of clears Prophylaxis: Lovenox SQ  Disposition: Pending recovery s/p surgical removal of colonic mass with diverting colostomy. Expect DC this coming Tuesday.  Subjective:  Tolerating liquids well. No CP, SOB.  Some abd pain after surgery but mostly controlled.  Does not want to take any narcotics.   Objective: Temp:  [97.7 F (36.5 C)-98.2 F (36.8 C)] 97.7 F (36.5 C) (04/08 1249) Pulse Rate:  [96-101] 96 (04/08 1249) Resp:  [18] 18 (04/08 0513) BP: (110-127)/(62-65) 111/62 (04/08 1249) SpO2:  [96 %-100 %] 99 % (04/08 1249) Physical Exam: General: well nourished, well developed, NAD CV: RRR without murmurs, rubs, or gallops Lungs: CTAB, no wheezing with normal work of breathing Abdomen: soft, minimally distended, minimal tenderness to palpation near site of incision without erythema or drainage, dressings intact, colostomy intact without bleeding or erythema, normoactive bowel sounds appreciated Extremities: warm and well perfused, normal tone, non-edematous  Laboratory:  Recent Labs Lab 09/19/16 0329 09/19/16 2255 09/20/16 0251  WBC 17.3* 14.5* 13.6*  HGB 10.0* 8.5* 8.5*  HCT 31.8* 26.6* 26.7*  PLT 424* 341 334    Recent Labs Lab 09/16/16 0930  09/19/16 0329 09/20/16 0251 09/21/16 0506  NA 135  < > 138 137 137  K 3.0*  < > 3.9 3.3* 4.2  CL 97*  < > 106 102 99*  CO2 22  < > 23 24 25   BUN 21*  < > 9 5* <5*  CREATININE 0.60  < > 0.63 0.53 0.42*  CALCIUM 9.3  < > 8.1* 8.0* 8.6*  PROT 7.3  --   --   --   --  BILITOT 1.2  --   --   --   --   ALKPHOS 76  --   --   --   --   ALT 17  --   --   --   --   AST 19  --   --   --   --   GLUCOSE 107*  < > 100* 82 80  < > = values in this interval not displayed. Lipase: <10 HIV: Nonreactive A1c: 5.7 (WNL)  CEA:  8.5 (H) Mag: 1.7 (WNL)  Imaging/Diagnostic Tests: DG Chest 2 View (09/17/2016) FINDINGS: Postsurgical changes of the lower cervical spine. Mildly diminished lung volumes. Mild bibasilar atelectasis. Borderline cardiomegaly, augmented by low lung volume. No pneumothorax. Dilated bowel in the upper abdomen.  IMPRESSION: 1. Low lung volumes with bibasilar atelectasis 2. Borderline cardiomegaly, exaggerated by low inspiration.  CT Abdomen Pelvis W Contrast (09/16/2016) FINDINGS: Lower chest: Dependent atelectasis is seen in the lung bases. No pleural or pericardial effusion. Hepatobiliary: The gallbladder is mildly distended. The liver and biliary tree appear normal. Pancreas: Unremarkable. No pancreatic ductal dilatation or surrounding inflammatory changes. Spleen: Normal in size without focal abnormality. Adrenals/Urinary Tract: Adrenal glands are unremarkable. Kidneys are normal, without renal calculi, focal lesion, or hydronephrosis. Bladder is unremarkable. Stomach/Bowel: There is marked distention of the colon with gas and stool to the distal sigmoid where the colon appears focally narrowed with an appearance worrisome for a mass lesion. The stomach, small bowel and appendix appear normal. Vascular/Lymphatic: Scattered aortoiliac atherosclerosis without aneurysm is identified. No lymphadenopathy. Reproductive: Status post hysterectomy. No adnexal masses. Other: No abdominal wall hernia or abnormality. No abdominopelvic ascites. Musculoskeletal: No acute abnormality. No lytic or sclerotic lesion.  IMPRESSION: Findings worrisome for a mass lesion in the distal sigmoid colon causing colonic obstruction. No evidence of metastatic disease identified. Atherosclerosis. Status post hysterectomy.  DG Abd Portable 1V (09/16/2016) FINDINGS: Nasogastric tube is identified distal tip probably at the GE junction. Advancement is recommended. Air-filled distended colon is  identified.  IMPRESSION: Nasogastric tube is identified distal tip probably at the GE junction, advancement is recommended. Air-filled distended colon is identified.  Elberta Leatherwood, MD, MPH 09/22/2016, 2:40 PM PGY-3, [place] Intern pager: [phone], text pages welcome

## 2016-09-23 ENCOUNTER — Encounter (HOSPITAL_COMMUNITY): Payer: Self-pay | Admitting: Family Medicine

## 2016-09-23 DIAGNOSIS — C189 Malignant neoplasm of colon, unspecified: Secondary | ICD-10-CM

## 2016-09-23 HISTORY — DX: Malignant neoplasm of colon, unspecified: C18.9

## 2016-09-23 LAB — COMPREHENSIVE METABOLIC PANEL
ALBUMIN: 1.9 g/dL — AB (ref 3.5–5.0)
ALT: 13 U/L — ABNORMAL LOW (ref 14–54)
ANION GAP: 8 (ref 5–15)
AST: 12 U/L — ABNORMAL LOW (ref 15–41)
Alkaline Phosphatase: 42 U/L (ref 38–126)
BUN: <5 mg/dL — ABNORMAL LOW (ref 6–20)
CO2: 26 mmol/L (ref 22–32)
Calcium: 8.5 mg/dL — ABNORMAL LOW (ref 8.9–10.3)
Chloride: 104 mmol/L (ref 101–111)
Creatinine, Ser: 0.35 mg/dL — ABNORMAL LOW (ref 0.44–1.00)
GFR calc Af Amer: >60 mL/min (ref >60)
GFR calc non Af Amer: >60 mL/min (ref >60)
GLUCOSE: 100 mg/dL — AB (ref 65–99)
POTASSIUM: 4.2 mmol/L (ref 3.5–5.1)
SODIUM: 138 mmol/L (ref 135–145)
TOTAL PROTEIN: 4.8 g/dL — AB (ref 6.5–8.1)
Total Bilirubin: 0.2 mg/dL — ABNORMAL LOW (ref 0.3–1.2)

## 2016-09-23 LAB — CBC
HCT: 25.1 % — ABNORMAL LOW (ref 36.0–46.0)
Hemoglobin: 8.3 g/dL — ABNORMAL LOW (ref 12.0–15.0)
MCH: 25.8 pg — ABNORMAL LOW (ref 26.0–34.0)
MCHC: 33.1 g/dL (ref 30.0–36.0)
MCV: 78 fL (ref 78.0–100.0)
Platelets: 357 10*3/uL (ref 150–400)
RBC: 3.22 MIL/uL — ABNORMAL LOW (ref 3.87–5.11)
RDW: 14.6 % (ref 11.5–15.5)
WBC: 8.8 10*3/uL (ref 4.0–10.5)

## 2016-09-23 LAB — GLUCOSE, CAPILLARY: GLUCOSE-CAPILLARY: 94 mg/dL (ref 65–99)

## 2016-09-23 NOTE — Consult Note (Addendum)
[place] Nurse wound consult note Pt is followed by the surgical team for assessment and plan of care. Removed Vac dressing with Will, PA at the bedside to assess the wound.  Wound is shallow and beefy red, Vac ordered d/ced and moist gauze dressing applied. Pt tolerated with mod discomfort Wound type: Full thickness post-op wound to midline abd  [place] Nurse ostomy consult note Stoma type/location: Colostomy surgery performed 4/6 Stomal assessment/size: Stoma red and viable, flush with skin level, 1 3/4 inches and oval Peristomal assessment: Intact skin surrounding Treatment options for stomal/peristomal skin: Barrier ring applied to assist with maintaining a seal, and 2 piece pouch Output: Mod amt brown liquid stool  Ostomy pouching: 1pc.  Education provided:  Pt assisted with pouch application and was able to open and close velcro to empty. Discussed ordering supplies and pouching routines, she asked appropriate questions. Extra supplies at the bedside with educational materials. Enrolled patient in [place] program: Yes Julien Girt MSN, RN, [place], [place], [place]

## 2016-09-23 NOTE — Progress Notes (Signed)
Family Medicine Teaching Service Daily Progress Note Intern Pager: [phone]  Patient name: Wanda Baker Medical record number: [id number] Date of birth: [birth year] Age: 56 y.o. Gender: female  Primary Care Provider: Lockie Pares, MD Consultants: General Surgery, Gastroenterology  Code Status: Full  Pt Overview and Major Events to Date:  04/02: Admit for complete colonic obstruction concerning for mass, GI consulted 04/03: Sigmoidoscopy shows 15 cm rectosigmoid mass concerning for adenocarcinoma 04/04: Resection of colonic mass with diverting colostomy 04/05: NG tube with ice chips 04/06: Clamp NG 04/07: NG tube pulled, clear liquid 04/09: Full liquid  Assessment and Plan: Wanda Baker is a 56 y.o. female presenting with 4 days of obstipation and nausea without vomiting. PMH is significant for HTN, NIDDM, HLD, GERD, s/p x2 C-sections and abdominal hysterectomy in 2011 due to fibroids. Of note, patient has no prior history of colonoscopy. Negative family history for cancer.  #Invasive adenocarcinoma, s/p Diverting Colostomy: POD #5. S/p low anterior colon resection with colostomy. Mass removed; negative margins; 0/20 lymph node involvement. --Wound care consulted for ostomy care --General surgery consulted, appreciate recommendations: transition to PO pain meds, full liquids, encourage ambulation --Zofran PRN nausea --Morphine 1-4 mg q4h PRN, tramadol 50-100mg  q6h prn for pain --S/p IV Cefotan (4/4-9) per surgery  #Acute Blood Loss Anemia: Stable. Hemoglobin from 13.1 > 8.5 now stabilized at 8.3. Transfusion threshold <7. --Daily CBC  #Hypertension: Normotensive. Tachycardia resolved. Afebrile for >24 hours since surgery without tachypnea.  --Holding lisinopril 5 mg BID and HCTZ 25 mg QD  #Non-insulin-dependent diabetes mellitus: Chronic. Controlled. A1c 5.7 on admission. --Holding metformin 500 mg BID --CBG daily   #Hyperlipidemia: Chronic. On statin therapy. --Holding  Lipitor 20 mg QD until able to take PO  #GERD: Chronic. Controlled on PPI. --Protonix 40 mg PO daily  FEN/GI: IV Protonix, full liquids Prophylaxis: Lovenox SQ  Disposition: Pending recovery s/p surgical removal of colonic mass with diverting colostomy. Expect DC 4/10.  Subjective:  Patient says she is happy about eating. Pain forward to having a soft diet around noon. Pain is well-controlled. Denies nausea.  Objective: Temp:  [97.7 F (36.5 C)-98 F (36.7 C)] 97.9 F (36.6 C) (04/09 0531) Pulse Rate:  [96-104] 99 (04/09 0531) Resp:  [18] 18 (04/09 0531) BP: (94-111)/(56-62) 94/56 (04/09 0531) SpO2:  [99 %] 99 % (04/09 0531) Physical Exam: General: well nourished, well developed, NAD CV: RRR without murmurs, rubs, or gallops Lungs: CTAB, no wheezing with normal work of breathing Abdomen: soft, minimally distended, minimal tenderness to palpation near site of incision without erythema or drainage, dressings intact, colostomy intact without bleeding or erythema, normoactive bowel sounds appreciated Extremities: warm and well perfused, normal tone, non-edematous  Laboratory:  Recent Labs Lab 09/19/16 2255 09/20/16 0251 09/23/16 0525  WBC 14.5* 13.6* 8.8  HGB 8.5* 8.5* 8.3*  HCT 26.6* 26.7* 25.1*  PLT 341 334 357    Recent Labs Lab 09/16/16 0930  09/20/16 0251 09/21/16 0506 09/23/16 0525  NA 135  < > 137 137 138  K 3.0*  < > 3.3* 4.2 4.2  CL 97*  < > 102 99* 104  CO2 22  < > 24 25 26   BUN 21*  < > 5* <5* <5*  CREATININE 0.60  < > 0.53 0.42* 0.35*  CALCIUM 9.3  < > 8.0* 8.6* 8.5*  PROT 7.3  --   --   --  4.8*  BILITOT 1.2  --   --   --  0.2*  ALKPHOS 76  --   --   --  42  ALT 17  --   --   --  13*  AST 19  --   --   --  12*  GLUCOSE 107*  < > 82 80 100*  < > = values in this interval not displayed. Lipase: <10 HIV: Nonreactive A1c: 5.7 (WNL)  CEA: 8.5 (H) Mag: 1.7 (WNL)  Imaging/Diagnostic Tests: DG Chest 2 View (09/17/2016) FINDINGS: Postsurgical  changes of the lower cervical spine. Mildly diminished lung volumes. Mild bibasilar atelectasis. Borderline cardiomegaly, augmented by low lung volume. No pneumothorax. Dilated bowel in the upper abdomen.  IMPRESSION: 1. Low lung volumes with bibasilar atelectasis 2. Borderline cardiomegaly, exaggerated by low inspiration.  CT Abdomen Pelvis W Contrast (09/16/2016) FINDINGS: Lower chest: Dependent atelectasis is seen in the lung bases. No pleural or pericardial effusion. Hepatobiliary: The gallbladder is mildly distended. The liver and biliary tree appear normal. Pancreas: Unremarkable. No pancreatic ductal dilatation or surrounding inflammatory changes. Spleen: Normal in size without focal abnormality. Adrenals/Urinary Tract: Adrenal glands are unremarkable. Kidneys are normal, without renal calculi, focal lesion, or hydronephrosis. Bladder is unremarkable. Stomach/Bowel: There is marked distention of the colon with gas and stool to the distal sigmoid where the colon appears focally narrowed with an appearance worrisome for a mass lesion. The stomach, small bowel and appendix appear normal. Vascular/Lymphatic: Scattered aortoiliac atherosclerosis without aneurysm is identified. No lymphadenopathy. Reproductive: Status post hysterectomy. No adnexal masses. Other: No abdominal wall hernia or abnormality. No abdominopelvic ascites. Musculoskeletal: No acute abnormality. No lytic or sclerotic lesion.  IMPRESSION: Findings worrisome for a mass lesion in the distal sigmoid colon causing colonic obstruction. No evidence of metastatic disease identified. Atherosclerosis. Status post hysterectomy.  DG Abd Portable 1V (09/16/2016) FINDINGS: Nasogastric tube is identified distal tip probably at the GE junction. Advancement is recommended. Air-filled distended colon is identified.  IMPRESSION: Nasogastric tube is identified distal tip probably at the GE junction, advancement is recommended.  Air-filled distended colon is identified.  Sterling Bing, DO 09/23/2016, 8:37 AM PGY-1, [place] Intern pager: [phone], text pages welcome

## 2016-09-23 NOTE — Progress Notes (Signed)
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  She has stool and gas in the ostomy bag.  We will get her switched over to MWF on her wound vac today.  Advance her diet.    Objective: Vital signs in last 24 hours: Temp:  [97.7 F (36.5 C)-98 F (36.7 C)] 97.9 F (36.6 C) (04/09 0531) Pulse Rate:  [96-104] 99 (04/09 0531) Resp:  [18] 18 (04/09 0531) BP: (94-111)/(56-62) 94/56 (04/09 0531) SpO2:  [99 %] 99 % (04/09 0531) Last BM Date: 09/22/16 444 PO recorded 1692 IV recorded Urine 2550 Drain 35 Stool 60 Afebrile, VSS Labs OK still anemic No films Intake/Output from previous day: 04/08 0701 - 04/09 0700 In: 2136.5 [P.O.:444; I.V.:1692.5] Out: 2645 [Urine:2550; Drains:35; Stool:60] Intake/Output this shift: No intake/output data recorded.  General appearance: alert, cooperative and no distress Resp: clear to auscultation bilaterally GI: soft, sore, wound vac in place. Stool in the ostomy bag, + BS, tolerating diet.  Lab Results:   Recent Labs  09/23/16 0525  WBC 8.8  HGB 8.3*  HCT 25.1*  PLT 357    BMET  Recent Labs  09/21/16 0506 09/23/16 0525  NA 137 138  K 4.2 4.2  CL 99* 104  CO2 25 26  GLUCOSE 80 100*  BUN <5* <5*  CREATININE 0.42* 0.35*  CALCIUM 8.6* 8.5*   PT/INR No results for input(s): LABPROT, INR in the last 72 hours.   Recent Labs Lab 09/16/16 0930 09/23/16 0525  AST 19 12*  ALT 17 13*  ALKPHOS 76 42  BILITOT 1.2 0.2*  PROT 7.3 4.8*  ALBUMIN 3.8 1.9*     Lipase     Component Value Date/Time   LIPASE <10 (L) 09/16/2016 0930     Studies/Results: No results found.  Medications: . cefoTEtan (CEFOTAN) IV  2 g Intravenous Q12H  . enoxaparin (LOVENOX) injection  40 mg Subcutaneous Q24H  . pantoprazole  40 mg Oral Q0600  . saccharomyces boulardii   250 mg Oral BID   . lactated ringers 1,000 mL with potassium chloride 40 mEq infusion 75 mL/hr at 09/23/16 0253    1. Colon, segmental resection for tumor, Rectal Sigmoid - INVASIVE ADENOCARCINOMA WITH EXTRACELLULAR MUCIN, MODERATELY DIFFERENTIATED, SPANNING 5 CM. - TUMOR INVADES INTO PERICOLONIC TISSUE, SEE COMMENT. - RESECTION MARGINS ARE NEGATIVE. - TWENTY OF TWENTY LYMPH NODES NEGATIVE FOR MALIGNANCY (0/20). - SEE ONCOLOGY TABLE. 2. Colon, segmental resection, additional proximal sigmoid - BENIGN COLONIC MUCOSA WITH FOCAL NECROSIS AND ACUTE INFLAMMATION. - NO DYSPLASIA OR MALIGNANCY.   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 Diverting colostomy, 09/18/16, Dr. Fanny Skates POD 5 Hypertension Type 2 diabetes GERD Hyperlipidemia Status post C-section 2/abdominal hysterectomy FEN: IV fluids/full liquids ID: Cefotetan 2 gm q12h 09/18/16, =>>Day 5 completed.  I will stop antibiotics. DVT: Lovenox   Plan:  Advance diet, continue wound care.  If she does well home soon.  I have stopped antibiotics.        LOS: 7 days    Mert Dietrick 09/23/2016 [phone]

## 2016-09-23 NOTE — Care Management Note (Signed)
Case Management Note  Patient Details  Name: MATILYNN DACEY MRN: [id number] Date of Birth: [birth year]  Subjective/Objective:                    Action/Plan:  Confirmed face sheet information with patient. Patient is uninsured but states she has applied for emergency Medicaid .   Patient does want 3 in 1 and walker.   PT recommending HHPT, however Medicaid would not cover HHPT with her DX. Patient voiced understanding.   Referral for [facility] given to [facility].   VAC has been discontinued . Patient aware she will be taught wet to dry dressing changes before discharge, and HHRN will not be present daily at home.   On day of discharge NCM will provide [place] letter ( medication assistance) .  Patient voiced understanding to all of above. Expected Discharge Date:                  Expected Discharge Plan:  [place]  In-House Referral:     Discharge planning Services  CM Consult  Post Acute Care Choice:    Choice offered to:  Patient  DME Arranged:  3-N-1, Walker rolling DME Agency:  [place]:  RN [facility] Agency:  [place]  Status of Service:  In process, will continue to follow  If discussed at Long Length of Stay Meetings, dates discussed:    Additional Comments:  Marilu Favre, RN 09/23/2016, 10:20 AM

## 2016-09-24 LAB — COMPREHENSIVE METABOLIC PANEL
ALT: 19 U/L (ref 14–54)
AST: 21 U/L (ref 15–41)
Albumin: 1.8 g/dL — ABNORMAL LOW (ref 3.5–5.0)
Alkaline Phosphatase: 44 U/L (ref 38–126)
Anion gap: 7 (ref 5–15)
CHLORIDE: 104 mmol/L (ref 101–111)
CO2: 28 mmol/L (ref 22–32)
CREATININE: 0.4 mg/dL — AB (ref 0.44–1.00)
Calcium: 8.5 mg/dL — ABNORMAL LOW (ref 8.9–10.3)
Glucose, Bld: 91 mg/dL (ref 65–99)
POTASSIUM: 3.8 mmol/L (ref 3.5–5.1)
SODIUM: 139 mmol/L (ref 135–145)
Total Bilirubin: 0.2 mg/dL — ABNORMAL LOW (ref 0.3–1.2)
Total Protein: 4.8 g/dL — ABNORMAL LOW (ref 6.5–8.1)

## 2016-09-24 LAB — CBC
HCT: 24.2 % — ABNORMAL LOW (ref 36.0–46.0)
Hemoglobin: 7.8 g/dL — ABNORMAL LOW (ref 12.0–15.0)
MCH: 25.2 pg — ABNORMAL LOW (ref 26.0–34.0)
MCHC: 32.2 g/dL (ref 30.0–36.0)
MCV: 78.3 fL (ref 78.0–100.0)
Platelets: 373 10*3/uL (ref 150–400)
RBC: 3.09 MIL/uL — ABNORMAL LOW (ref 3.87–5.11)
RDW: 14.6 % (ref 11.5–15.5)
WBC: 9.2 10*3/uL (ref 4.0–10.5)

## 2016-09-24 LAB — IRON AND TIBC
IRON: 9 ug/dL — AB (ref 28–170)
SATURATION RATIOS: 4 % — AB (ref 10.4–31.8)
TIBC: 249 ug/dL — ABNORMAL LOW (ref 250–450)
UIBC: 240 ug/dL

## 2016-09-24 LAB — LACTATE DEHYDROGENASE: LDH: 142 U/L (ref 98–192)

## 2016-09-24 LAB — FOLATE: Folate: 12.3 ng/mL (ref >5.9)

## 2016-09-24 LAB — RETICULOCYTES
RBC.: 3.72 MIL/uL — ABNORMAL LOW (ref 3.87–5.11)
RETIC CT PCT: 1.5 % (ref 0.4–3.1)
Retic Count, Absolute: 55.8 10*3/uL (ref 19.0–186.0)

## 2016-09-24 LAB — VITAMIN B12: VITAMIN B 12: 669 pg/mL (ref 180–914)

## 2016-09-24 LAB — GLUCOSE, CAPILLARY: GLUCOSE-CAPILLARY: 92 mg/dL (ref 65–99)

## 2016-09-24 LAB — FERRITIN: Ferritin: 40 ng/mL (ref 11–307)

## 2016-09-24 MED ORDER — FERROUS SULFATE 325 (65 FE) MG PO TABS
325.0000 mg | ORAL_TABLET | Freq: Three times a day (TID) | ORAL | Status: DC
  Administered 2016-09-25 (×2): 325 mg via ORAL
  Filled 2016-09-24 (×3): qty 1

## 2016-09-24 NOTE — Progress Notes (Signed)
OT Cancellation Note  Patient Details Name: Wanda Baker MRN: [id number] DOB: 13-Apr-1961   Cancelled Treatment:     Reason pt not seen/OT evaluation not completed: Upon OT arrival, pt was up ambulating in room. Pt was educated in role of OT and clearly stated "I don't need that". Pt states that she will have PRN family assist for ADL's at d/c and declines OT Evaluation this session. She was educated to let her RN/MD know should anything change related to ADL's or if any concerns arise. Will sign off/discharge OT at this time per patient request.  [place], Amy Ardath Sax, OTR/L   Amy Barnhill, OTR/L 09/24/16 10:10 AM

## 2016-09-24 NOTE — Progress Notes (Signed)
6 Days Post-Op  Subjective: Doing well tolerating diet, not allot of stool in bag, plenty of gas.  Still working things out at home for discharge.  Open site has some purulent drainage at the base, so I am glad it is open.  Nothing from JP and I pulled the drain.    Objective: Vital signs in last 24 hours: Temp:  [97.6 F (36.4 C)-98.2 F (36.8 C)] 97.6 F (36.4 C) (04/10 0500) Pulse Rate:  [86-107] 97 (04/10 0500) Resp:  [20] 20 (04/10 0500) BP: (100-113)/(65-70) 105/65 (04/10 0500) SpO2:  [96 %-100 %] 100 % (04/10 0500) Last BM Date: 09/23/16 440 PO recorded 2000 IV Drain 5 ml Stool 0 recorded Afebrile, VSS Labs OK Intake/Output from previous day: 04/09 0701 - 04/10 0700 In: 2437.5 [P.O.:440; I.V.:1997.5] Out: 5 [Drains:5] Intake/Output this shift: No intake/output data recorded.  General appearance: alert, cooperative and no distress Resp: clear to auscultation bilaterally GI: soft, sore, open site with some purulent drainage at the base.  Ostomy working stool and gas in the bag.    Lab Results:   Recent Labs  09/23/16 0525 09/24/16 0453  WBC 8.8 9.2  HGB 8.3* 7.8*  HCT 25.1* 24.2*  PLT 357 373    BMET  Recent Labs  09/23/16 0525 09/24/16 0453  NA 138 139  K 4.2 3.8  CL 104 104  CO2 26 28  GLUCOSE 100* 91  BUN <5* <5*  CREATININE 0.35* 0.40*  CALCIUM 8.5* 8.5*   PT/INR No results for input(s): LABPROT, INR in the last 72 hours.   Recent Labs Lab 09/23/16 0525 09/24/16 0453  AST 12* 21  ALT 13* 19  ALKPHOS 42 44  BILITOT 0.2* 0.2*  PROT 4.8* 4.8*  ALBUMIN 1.9* 1.8*     Lipase     Component Value Date/Time   LIPASE <10 (L) 09/16/2016 0930     Studies/Results: No results found.  Medications: . enoxaparin (LOVENOX) injection  40 mg Subcutaneous Q24H  . pantoprazole  40 mg Oral Q0600  . 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 Diverting colostomy, 09/18/16, Dr. Fanny Skates POD 6 Hypertension Type 2 diabetes GERD Hyperlipidemia Status post C-section 2/abdominal hysterectomy FEN: IV fluids/soft diet ID: Cefotetan 2 gm q12h 09/18/16, =>>Day 5 completed.  I will stop antibiotics. DVT: Lovenox   Plan:  From our standpoint she can go with wet to dry dressings BID.  She is working on home arrangement.  I will put follow up in the AVS.  She has an appointment on 10/10/16 with Dr. Dalbert Batman.      LOS: 8 days    Earnstine Regal 09/24/2016 [phone]

## 2016-09-24 NOTE — Progress Notes (Signed)
Family Medicine Teaching Service Daily Progress Note Intern Pager: [phone]  Patient name: Wanda Baker Medical record number: [id number] Date of birth: Nov 12, 1960 Age: 56 y.o. Gender: female  Primary Care Provider: Lockie Pares, MD Consultants: General Surgery, Gastroenterology  Code Status: Full  Pt Overview and Major Events to Date:  04/02: Admit for complete colonic obstruction concerning for mass, GI consulted 04/03: Sigmoidoscopy shows 15 cm rectosigmoid mass concerning for adenocarcinoma 04/04: Resection of colonic mass with diverting colostomy 04/05: NG tube with ice chips 04/06: Clamp NG 04/07: NG tube pulled, clear liquid 04/09: Full liquid > soft diet  Assessment and Plan: Wanda Baker is a 56 y.o. female presenting with 4 days of obstipation and nausea without vomiting. PMH is significant for HTN, NIDDM, HLD, GERD, s/p x2 C-sections and abdominal hysterectomy in 2011 due to fibroids. Of note, patient has no prior history of colonoscopy. Negative family history for cancer.  #Invasive adenocarcinoma, s/p Diverting Colostomy: POD #6. S/p low anterior colon resection with colostomy. Mass removed; negative margins; 0/20 lymph node involvement. --Wound care consulted for ostomy care --General surgery consulted, appreciate recommendations: transition to PO pain meds, advance diet, encourage ambulation --Zofran PRN nausea --Tylenol #3 1-2 tabs q4h PRN, tramadol 50-100mg  q6h prn for pain --S/p IV Cefotan (4/4-4/9) per surgery  #Acute Blood Loss Anemia: Slowly worsening Hgb. Hemoglobin from 13.1 > 8.5 > 7.8. Transfusion threshold <7. --Anemia panel with haptoglobin and lactic dehydrogenase levels pending --Daily CBC  #Hypertension: Normotensive. Tachycardia resolved. Afebrile for >24 hours since surgery without tachypnea.  --Holding lisinopril 5 mg BID and HCTZ 25 mg QD  #Non-insulin-dependent diabetes mellitus: Chronic. Controlled. A1c 5.7 on admission. --Holding  metformin 500 mg BID --CBG daily   #Hyperlipidemia: Chronic. On statin therapy. --Holding Lipitor 20 mg QD until able to take PO  #GERD: Chronic. Controlled on PPI. --Protonix 40 mg PO daily  FEN/GI: Soft diet, PPI Prophylaxis: Lovenox SQ  Disposition: Pending recovery s/p surgical removal of colonic mass with diverting colostomy. Expect DC 4/11 or 4/12.  Subjective:  Patient states she is tolerating soft diet well. Denies nausea. Abdominal pain is well-controlled on medications. Continues to pass flatus.  Objective: Temp:  [97.6 F (36.4 C)-98.2 F (36.8 C)] 97.6 F (36.4 C) (04/10 0500) Pulse Rate:  [86-107] 97 (04/10 0500) Resp:  [20] 20 (04/10 0500) BP: (100-113)/(65-70) 105/65 (04/10 0500) SpO2:  [96 %-100 %] 100 % (04/10 0500) Physical Exam: General: well nourished, well developed, NAD CV: RRR without murmurs, rubs, or gallops Lungs: CTAB, no wheezing with normal work of breathing Abdomen: soft, minimally distended, minimal tenderness to palpation near site of incision without erythema or drainage, dressings intact, colostomy intact without bleeding or erythema, normoactive bowel sounds appreciated Extremities: warm and well perfused, normal tone, non-edematous  Laboratory:  Recent Labs Lab 09/20/16 0251 09/23/16 0525 09/24/16 0453  WBC 13.6* 8.8 9.2  HGB 8.5* 8.3* 7.8*  HCT 26.7* 25.1* 24.2*  PLT 334 357 373    Recent Labs Lab 09/21/16 0506 09/23/16 0525 09/24/16 0453  NA 137 138 139  K 4.2 4.2 3.8  CL 99* 104 104  CO2 25 26 28   BUN <5* <5* <5*  CREATININE 0.42* 0.35* 0.40*  CALCIUM 8.6* 8.5* 8.5*  PROT  --  4.8* 4.8*  BILITOT  --  0.2* 0.2*  ALKPHOS  --  42 44  ALT  --  13* 19  AST  --  12* 21  GLUCOSE 80 100* 91   Lipase: <10 HIV: Nonreactive  A1c: 5.7 (WNL)  CEA: 8.5 (H) Mag: 1.7 (WNL)  Imaging/Diagnostic Tests: DG Chest 2 View (09/17/2016) FINDINGS: Postsurgical changes of the lower cervical spine. Mildly diminished lung volumes.  Mild bibasilar atelectasis. Borderline cardiomegaly, augmented by low lung volume. No pneumothorax. Dilated bowel in the upper abdomen.  IMPRESSION: 1. Low lung volumes with bibasilar atelectasis 2. Borderline cardiomegaly, exaggerated by low inspiration.  CT Abdomen Pelvis W Contrast (09/16/2016) FINDINGS: Lower chest: Dependent atelectasis is seen in the lung bases. No pleural or pericardial effusion. Hepatobiliary: The gallbladder is mildly distended. The liver and biliary tree appear normal. Pancreas: Unremarkable. No pancreatic ductal dilatation or surrounding inflammatory changes. Spleen: Normal in size without focal abnormality. Adrenals/Urinary Tract: Adrenal glands are unremarkable. Kidneys are normal, without renal calculi, focal lesion, or hydronephrosis. Bladder is unremarkable. Stomach/Bowel: There is marked distention of the colon with gas and stool to the distal sigmoid where the colon appears focally narrowed with an appearance worrisome for a mass lesion. The stomach, small bowel and appendix appear normal. Vascular/Lymphatic: Scattered aortoiliac atherosclerosis without aneurysm is identified. No lymphadenopathy. Reproductive: Status post hysterectomy. No adnexal masses. Other: No abdominal wall hernia or abnormality. No abdominopelvic ascites. Musculoskeletal: No acute abnormality. No lytic or sclerotic lesion.  IMPRESSION: Findings worrisome for a mass lesion in the distal sigmoid colon causing colonic obstruction. No evidence of metastatic disease identified. Atherosclerosis. Status post hysterectomy.  DG Abd Portable 1V (09/16/2016) FINDINGS: Nasogastric tube is identified distal tip probably at the GE junction. Advancement is recommended. Air-filled distended colon is identified.  IMPRESSION: Nasogastric tube is identified distal tip probably at the GE junction, advancement is recommended. Air-filled distended colon is identified.  Crest Bing,  DO 09/24/2016, 8:44 AM PGY-1, [place] Intern pager: [phone], text pages welcome

## 2016-09-24 NOTE — Discharge Instructions (Signed)
[facility]      [facility] °[phone] ° °OPEN ABDOMINAL SURGERY: POST OP INSTRUCTIONS ° °Always review your discharge instruction sheet given to you by the facility where your surgery was performed. ° °IF YOU HAVE DISABILITY OR FAMILY LEAVE FORMS, YOU MUST BRING THEM TO THE OFFICE FOR PROCESSING.  PLEASE DO NOT GIVE THEM TO YOUR DOCTOR. ° °1. A prescription for pain medication may be given to you upon discharge.  Take your pain medication as prescribed, if needed.  If narcotic pain medicine is not needed, then you may take acetaminophen (Tylenol) or ibuprofen (Advil) as needed. °2. Take your usually prescribed medications unless otherwise directed. °3. If you need a refill on your pain medication, please contact your pharmacy. They will contact our office to request authorization.  Prescriptions will not be filled after 5pm or on week-ends. °4. You should follow a light diet the first few days after arrival home, such as soup and crackers, pudding, etc.unless your doctor has advised otherwise. A high-fiber, low fat diet can be resumed as tolerated.   Be sure to include lots of fluids daily. Most patients will experience some swelling and bruising on the chest and neck area.  Ice packs will help.  Swelling and bruising can take several days to resolve °5. Most patients will experience some swelling and bruising in the area of the incision. Ice pack will help. Swelling and bruising can take several days to resolve..  °6. It is common to experience some constipation if taking pain medication after surgery.  Increasing fluid intake and taking a stool softener will usually help or prevent this problem from occurring.  A mild laxative (Milk of Magnesia or Miralax) should be taken according to package directions if there are no bowel movements after 48 hours. °7.  You may have steri-strips (small skin tapes) in place directly over the incision.  These strips should be left on the skin for 7-10 days.  If your  surgeon used skin glue on the incision, you may shower in 24 hours.  The glue will flake off over the next 2-3 weeks.  Any sutures or staples will be removed at the office during your follow-up visit. You may find that a light gauze bandage over your incision may keep your staples from being rubbed or pulled. You may shower and replace the bandage daily. °8. ACTIVITIES:  You may resume regular (light) daily activities beginning the next day--such as daily self-care, walking, climbing stairs--gradually increasing activities as tolerated.  You may have sexual intercourse when it is comfortable.  Refrain from any heavy lifting or straining until approved by your doctor. °a. You may drive when you no longer are taking prescription pain medication, you can comfortably wear a seatbelt, and you can safely maneuver your car and apply brakes °b. Return to Work: ___________________________________ °9. You should see your doctor in the office for a follow-up appointment approximately two weeks after your surgery.  Make sure that you call for this appointment within a day or two after you arrive home to insure a convenient appointment time. °OTHER INSTRUCTIONS:  °_____________________________________________________________ °_____________________________________________________________ ° °WHEN TO CALL YOUR DOCTOR: °1. Fever over 101.0 °2. Inability to urinate °3. Nausea and/or vomiting °4. Extreme swelling or bruising °5. Continued bleeding from incision. °6. Increased pain, redness, or drainage from the incision. °7. Difficulty swallowing or breathing °8. Muscle cramping or spasms. °9. Numbness or tingling in hands or feet or around lips. ° °The clinic staff is available to   answer your questions during regular business hours.  Please dont hesitate to call and ask to speak to one of the nurses if you have concerns.  For further questions, please visit [email or web address]   Mechanical Wound  Debridement Mechanical wound debridement is a treatment to remove dead tissue from a wound. This helps the wound heal. The treatment involves cleaning the wound (irrigation) and using a pad or gauze (dressing) to remove dead tissue and debris from the wound. There are different types of mechanical wound debridement. Depending on the wound, you may need to repeat this procedure or change to another form of debridement as your wound starts to heal. Tell a health care provider about:  Any allergies you have.  All medicines you are taking, including vitamins, herbs, eye drops, creams, and over-the-counter medicines.  Any blood disorders you have.  Any medical conditions you have, including any conditions that:  Cause a significant decrease in blood circulation to the part of the body where the wound is, such as peripheral vascular disease.  Compromise your defense (immune) system or white blood count.  Any surgeries you have had.  Whether you are pregnant or may be pregnant. What are the risks? Generally, this is a safe procedure. However, problems may occur, including:  Infection.  Bleeding.  Damage to healthy tissue in and around your wound.  Soreness or pain.  Failure of the wound to heal.  Scarring. What happens before the procedure? You may be given antibiotic medicine to help prevent infection. What happens during the procedure?  Your health care provider may apply a numbing medicine (topical anesthetic) to the wound.  Your health care provider will irrigate your wound with a germ-free (sterile), salt-water (saline) solution. This removes debris, bacteria, and dead tissue.  Depending on what type of mechanical wound debridement you are having, your health care provider may do one of the following:  Put a dressing on your wound. You may have dry gauze pad placed into the wound. Your health care provider will remove the gauze after the wound is dry. Any dead tissue and  debris that has dried into the gauze will be lifted out of the wound (wet-to-dry debridement).  Use a type of pad (monofilament fiber debridement pad). This pad has a fluffy surface on one side that picks up dead tissue and debris from your wound. Your health care provider wets the pad and wipes it over your wound for several minutes.  Irrigate your wound with a pressurized stream of solution such as saline or water.  Once your health care provider is finished, he or she may apply a light dressing to your wound. The procedure may vary among health care providers and hospitals. What happens after the procedure?  You may receive medicine for pain.  You will continue to receive antibiotic medicine if it was started before your procedure. This information is not intended to replace advice given to you by your health care provider. Make sure you discuss any questions you have with your health care provider. Document Released: 02/22/2015 Document Revised: 11/09/2015 Document Reviewed: 10/12/2014 Elsevier Interactive Patient Education  2017 [place], Adult A colostomy is a surgical procedure to make an opening (stoma) for stool (feces) to leave your body. This surgery is done when a medical condition prevents stool from leaving your body through the end of the large intestine (rectum). During the surgery, part of the large intestine (colon) is attached to the stoma that is made  in the front of your abdomen. A bag (pouch) is fitted over the stoma. Stool and gas will collect in the bag. After having this surgery, you will need to empty and change your colostomy bag as needed. You will also need to care for the stoma. How do I care for my stoma? Your stoma should look pink, red, and moist, like the inside of your cheek. At first, the stoma may be swollen, but this swelling will go away within 6 weeks. To care for the stoma:  Keep the skin around the stoma clean and  dry.  Use a clean, soft washcloth to gently wash the stoma and the skin around it.  Use warm water and only use cleansers recommended by your health care provider.  Rinse the stoma area with plain water.  Dry the area well.  Use stoma powder or ointment on your skin only as told by your health care provider. Do not use any other powders, gels, wipes, or creams on your skin.  Change your colostomy bag if your skin becomes irritated. Irritation may indicate that the bag is leaking.  Check your stoma area every day for signs of infection. Check for:  More redness, swelling, or pain.  More fluid or blood.  Pus or warmth.  Measure the stoma opening regularly and record the size. Watch for changes. Share this information with your health care provider. How do I care for my colostomy bag? The bag that fits over the stoma can have either one or two pieces.  One-piece bag: The skin barrier and the bag are combined in a single unit.  Two-piece bag: The skin barrier and the bag are separate pieces that attach to each other. 1.  Empty your bag at bedtime and whenever it is one-third to one-half full. Do not let more stool or gas build up. This could cause the bag to leak. Some colostomy bags have a built-in gas release valve. Change the bag every 3-4 days or as told by your health care provider. Also change the bag if it is leaking or separating from the skin or your skin looks irritated. How do I empty my colostomy bag? Before you leave the hospital, you will be taught how to empty your bag. Follow these basic steps: 1. Wash your hands with soap and water. 2. Sit far back on the toilet. 3. Put several pieces of toilet paper into the toilet water. This will prevent splashing as you empty the stool into the toilet. 4. Remove the clip or the velcro from the tail end of the bag. 5. Unroll the tail, then empty stool into the toilet. 6.  7. Reroll the tail, and close it with the clip or  velcro. 8. Wash your hands again. How do I change my colostomy bag? Before you leave the hospital, you will be taught how to change your bag. Always have colostomy supplies with you, and follow these basic steps: 1. Wash your hands with soap and water. Have paper towels or tissues near you to clean any discharge. 2. Use a template to pre-cut the skin barrier. Smooth any rough edges. 3. If using a two-piece bag, attach the bag and the skin barrier to each other. Add the barrier ring, if you use one. 4. If your stools are watery, add a few cotton balls to the new bag to absorb the liquid. 5. Remove the old bag and skin barrier. Gently push the skin away from the barrier with your fingers or  a warm cloth. 6. Wash your hands again. Then clean the stoma area as directed with water or with mild soap and water. Use water to rinse away any soap. 7. Dry the skin. You may use the cool setting on a hair dryer to do this. 8. If directed, apply stoma powder or skin barrier gel to the skin. 9. Dry the skin again. 10. Warm the skin barrier with your hands or a warm compress. 11. Remove the paper from the sticky (adhesive) strip of the skin barrier. 12. Press the adhesive strip onto the skin around the stoma. 13. Gently rub the skin barrier onto the skin. This creates heat that helps the barrier to stick. 14. Apply stoma tape to the edges of the skin barrier. What are some general tips?  Avoid wearing clothes that are tight directly over your stoma.  You may shower or bathe with the colostomy bag on or off. Do not use harsh or oily soaps or lotions. Dry the skin and bag after bathing.  Store all supplies in a cool, dry place. Do not leave supplies in extreme heat because parts can melt.  Whenever you leave home, take an extra skin barrier and colostomy bag with you.  If your colostomy bag gets wet, you can dry it with a hair dryer on the cool setting.  To prevent odor, put drops of ostomy deodorizer in  the colostomy bag. Your health care provider may also recommend putting ostomy lubricant inside the bag. This helps the stool to slide out of the bag more easily and completely. Contact a health care provider if:  You have more redness, swelling, or pain around your stoma.  You have more fluid or blood coming from your stoma.  Your stoma feels warm to the touch.  You have pus coming from your stoma.  Your stoma extends in or out farther than normal.  You need to change the bag every day.  You have a fever. Get help right away if:  Your stool is bloody.  You vomit.  You have trouble breathing. This information is not intended to replace advice given to you by your health care provider. Make sure you discuss any questions you have with your health care provider. Document Released: 06/06/2003 Document Revised: 10/12/2015 Document Reviewed: 10/06/2013 Elsevier Interactive Patient Education  2017 [employer].

## 2016-09-24 NOTE — Progress Notes (Signed)
qPhysical Therapy Treatment Patient Details Name: Wanda Baker MRN: [id number] DOB: [birth year] Today's Date: 09/24/2016    History of Present Illness Pt is a 56 yo female admitted through ED on 09/16/16 with abdominal discomfort. Pt underwent a surgical procedure on 09/18/16 for a resection of colon mass and colostomy. PMH significant for HTN, DM2, HLD, GERD.     PT Comments    Patient is making good progress with PT.  From a mobility standpoint anticipate patient will be ready for DC home. Pt is mod I for bed mobility, supervision for transfers and min guard for ambulation of 600 feet with RW. Pt requires skilled PT for stair training at next session to be able to safely enter her home at discharge.      Follow Up Recommendations  Home health PT     Equipment Recommendations  Rolling walker with 5" wheels;3in1 (PT)       Precautions / Restrictions Precautions Precautions: None Restrictions Weight Bearing Restrictions: No    Mobility  Bed Mobility Overal bed mobility: Needs Assistance Bed Mobility: Sidelying to Sit;Sit to Sidelying   Sidelying to sit: Modified independent (Device/Increase time)       General bed mobility comments: pt able to come to EoB using bedrails and HoB elevated  Transfers Overall transfer level: Needs assistance Equipment used: None Transfers: Sit to/from Stand Sit to Stand: Supervision         General transfer comment: good hand placement for push off  Ambulation/Gait Ambulation/Gait assistance: Min guard Ambulation Distance (Feet): 600 Feet Assistive device: Rolling walker (2 wheeled) Gait Pattern/deviations: Step-through pattern;WFL(Within Functional Limits) Gait velocity: decreased Gait velocity interpretation: Below normal speed for age/gender General Gait Details: decreased cadence, decreased step length, no LoB      Balance Overall balance assessment: No apparent balance deficits (not formally assessed)                                          Cognition Arousal/Alertness: Awake/alert Behavior During Therapy: WFL for tasks assessed/performed Overall Cognitive Status: Within Functional Limits for tasks assessed                                               Pertinent Vitals/Pain Pain Assessment: Faces Faces Pain Scale: Hurts a little bit Pain Location: abdomen Pain Descriptors / Indicators: Aching;Grimacing;Guarding Pain Intervention(s): Monitored during session           PT Goals (current goals can now be found in the care plan section) Acute Rehab PT Goals Patient Stated Goal: to get back home PT Goal Formulation: With patient Time For Goal Achievement: 09/29/16 Potential to Achieve Goals: Good    Frequency    Min 3X/week      PT Plan Current plan remains appropriate       End of Session Equipment Utilized During Treatment: Gait belt Activity Tolerance: Patient tolerated treatment well Patient left: in bed;with call bell/phone within reach Nurse Communication: Mobility status PT Visit Diagnosis: Muscle weakness (generalized) (M62.81);Difficulty in walking, not elsewhere classified (R26.2)     Time: 9379-0240 PT Time Calculation (min) (ACUTE ONLY): 18 min  Charges:  $Gait Training: 8-22 mins  G Codes:       Brynley Cuddeback B. Migdalia Dk PT, DPT Acute Rehabilitation  [phone] Pager [phone]    [place] 09/24/2016, 4:20 PM

## 2016-09-25 LAB — BASIC METABOLIC PANEL
ANION GAP: 9 (ref 5–15)
CHLORIDE: 102 mmol/L (ref 101–111)
CO2: 28 mmol/L (ref 22–32)
Calcium: 8.6 mg/dL — ABNORMAL LOW (ref 8.9–10.3)
Creatinine, Ser: 0.36 mg/dL — ABNORMAL LOW (ref 0.44–1.00)
GFR calc Af Amer: >60 mL/min (ref >60)
GFR calc non Af Amer: >60 mL/min (ref >60)
GLUCOSE: 101 mg/dL — AB (ref 65–99)
Potassium: 3.4 mmol/L — ABNORMAL LOW (ref 3.5–5.1)
Sodium: 139 mmol/L (ref 135–145)

## 2016-09-25 LAB — CBC
HCT: 25.2 % — ABNORMAL LOW (ref 36.0–46.0)
Hemoglobin: 8 g/dL — ABNORMAL LOW (ref 12.0–15.0)
MCH: 24.8 pg — ABNORMAL LOW (ref 26.0–34.0)
MCHC: 31.7 g/dL (ref 30.0–36.0)
MCV: 78.3 fL (ref 78.0–100.0)
Platelets: 389 10*3/uL (ref 150–400)
RBC: 3.22 MIL/uL — AB (ref 3.87–5.11)
RDW: 14.5 % (ref 11.5–15.5)
WBC: 10.4 10*3/uL (ref 4.0–10.5)

## 2016-09-25 LAB — GLUCOSE, CAPILLARY: GLUCOSE-CAPILLARY: 98 mg/dL (ref 65–99)

## 2016-09-25 LAB — HAPTOGLOBIN: Haptoglobin: 361 mg/dL — ABNORMAL HIGH (ref 34–200)

## 2016-09-25 MED ORDER — OXYCODONE-ACETAMINOPHEN 5-325 MG PO TABS: 1.0000 | ORAL_TABLET | Freq: Four times a day (QID) | ORAL | Status: DC | PRN

## 2016-09-25 MED ORDER — POTASSIUM CHLORIDE CRYS ER 20 MEQ PO TBCR
40.0000 meq | EXTENDED_RELEASE_TABLET | Freq: Two times a day (BID) | ORAL | Status: DC
  Administered 2016-09-25: 40 meq via ORAL
  Filled 2016-09-25: qty 2

## 2016-09-25 MED ORDER — TRAMADOL HCL 50 MG PO TABS: 100.0000 mg | ORAL_TABLET | Freq: Four times a day (QID) | ORAL | 0 refills | Status: DC | PRN

## 2016-09-25 MED ORDER — SACCHAROMYCES BOULARDII 250 MG PO CAPS: 250.0000 mg | ORAL_CAPSULE | Freq: Two times a day (BID) | ORAL | 0 refills | Status: DC

## 2016-09-25 MED ORDER — FERROUS SULFATE 325 (65 FE) MG PO TABS: 325.0000 mg | ORAL_TABLET | Freq: Three times a day (TID) | ORAL | 0 refills | Status: DC

## 2016-09-25 NOTE — Progress Notes (Signed)
Physical Therapy Treatment Patient Details Name: Wanda Baker MRN: [id number] DOB: September 05, 1960 Today's Date: 09/25/2016    History of Present Illness Pt is a 56 yo female admitted through ED on 09/16/16 with abdominal discomfort. Pt underwent a surgical procedure on 09/18/16 for a resection of colon mass and colostomy. PMH significant for HTN, DM2, HLD, GERD.     PT Comments    Patient reports she is getting up routinely and walking around hospital independently using walker to help build her energy.  Abdominal pain persists, but not limiting mobility.  Patient demonstrates ability to manage stairs for home environment with MIN guard assist.  Patient will continue to build strength and endurance over time, acute PT goals have been met, will DC skilled PT services, patient self ambulating on unit.   Follow Up Recommendations  Home health PT     Equipment Recommendations  Rolling walker with 5" wheels;3in1 (PT)    Recommendations for Other Services       Precautions / Restrictions Precautions Precautions: None Restrictions Weight Bearing Restrictions: No    Mobility  Bed Mobility Overal bed mobility: Modified Independent Bed Mobility: Supine to Sit     Supine to sit: Modified independent (Device/Increase time)     General bed mobility comments: Patient reports she is able to get in/out of be independently  Transfers Overall transfer level: Modified independent Equipment used: None Transfers: Sit to/from Stand Sit to Stand: Independent         General transfer comment: Moving around in room without device.  Ambulation/Gait Ambulation/Gait assistance: Modified independent (Device/Increase time) Ambulation Distance (Feet): 400 Feet Assistive device: Rolling walker (2 wheeled) Gait Pattern/deviations: WFL(Within Functional Limits);Step-through pattern Gait velocity: mild decrease Gait velocity interpretation: Below normal speed for age/gender General Gait Details:  decreased cadence, no LoB   Stairs Stairs: Yes   Stair Management: Two rails Number of Stairs: 4 General stair comments: movement on stairs increased abdominal pain to 6/10.  Wheelchair Mobility    Modified Rankin (Stroke Patients Only)       Balance Overall balance assessment: No apparent balance deficits (not formally assessed)                                          Cognition Arousal/Alertness: Awake/alert Behavior During Therapy: WFL for tasks assessed/performed Overall Cognitive Status: Within Functional Limits for tasks assessed                                        Exercises      General Comments        Pertinent Vitals/Pain Pain Score: 4  Pain Location: abdomen Pain Descriptors / Indicators: Aching;Grimacing;Guarding Pain Intervention(s): Monitored during session (Increased to 6/10 with stairs (movement).)    Home Living                      Prior Function            PT Goals (current goals can now be found in the care plan section) Acute Rehab PT Goals Patient Stated Goal: to get back home PT Goal Formulation: With patient Time For Goal Achievement: 09/29/16 Potential to Achieve Goals: Good Progress towards PT goals: Progressing toward goals;Goals met/education completed, patient discharged from PT    Frequency  Min 3X/week      PT Plan Current plan remains appropriate    Co-evaluation             End of Session Equipment Utilized During Treatment: Gait belt Activity Tolerance: Patient tolerated treatment well Patient left: in chair;with call bell/phone within reach Nurse Communication: Mobility status PT Visit Diagnosis: Muscle weakness (generalized) (M62.81);Difficulty in walking, not elsewhere classified (R26.2)     Time: 5271-2929 PT Time Calculation (min) (ACUTE ONLY): 13 min  Charges:  $Gait Training: 8-22 mins                    G Codes:       Judith Blonder,  DPT   Minard Millirons L 09/25/2016, 1:49 PM

## 2016-09-25 NOTE — Progress Notes (Signed)
Family Medicine Teaching Service Daily Progress Note Intern Pager: [phone]  Patient name: Wanda Baker Medical record number: [id number] Date of birth: October 11, 1960 Age: 56 y.o. Gender: female  Primary Care Provider: Lockie Pares, MD Consultants: General Surgery, Gastroenterology  Code Status: Full  Pt Overview and Major Events to Date:  04/02: Admit for complete colonic obstruction concerning for mass, GI consulted 04/03: Sigmoidoscopy shows 15 cm rectosigmoid mass concerning for adenocarcinoma 04/04: Resection of colonic mass with diverting colostomy 04/05: NG tube with ice chips 04/06: Clamp NG 04/07: NG tube pulled, clear liquid 04/09: Full liquid > soft diet 04/11: Hgb stable, cleared for d/c per surgery  Assessment and Plan: Wanda Baker is a 56 y.o. female presenting with 4 days of obstipation and nausea without vomiting. PMH is significant for HTN, NIDDM, HLD, GERD, s/p x2 C-sections and abdominal hysterectomy in 2011 due to fibroids. Of note, patient has no prior history of colonoscopy. Negative family history for cancer.  #Invasive adenocarcinoma, s/p Diverting Colostomy: POD #7. S/p low anterior colon resection with colostomy. Mass removed; negative margins; 0/20 lymph node involvement. --Wound care consulted for ostomy care --General surgery consulted, appreciate recommendations: wet to dry dressing changes at home, cleared for d/c --Tylenol #3 1-2 tabs q4h PRN, tramadol 50-100mg  q6h prn for pain --S/p IV Cefotan (4/4-4/9) per surgery --PT/OT: HHPT and rolling walker 5", 3 in 1  #Acute Blood Loss Anemia: Stable. Hemoglobin from 13.1 > 8.5 > 7.8 > 8.0. Transfusion threshold <7. MCV c/w underlying microcytic anemia. Haptoglobin 361 (H), Lactic dehydrogenase 142 (WNL) not c/w hemolytic anemia. Anemia panel: iron 9 (L), TIBC 249 (L), ferritin 40 (WNL), folate 12.3 (WNL), B12 669 (WNL), retic 1.5 (WNL). Labs seem c/w mixed IDA and ACD. --Daily CBC --Ostomy FOBT  pending --Iron supplement 325 mg TID  #Hypertension: Normotensive. Tachycardia resolved. Afebrile for >24 hours since surgery without tachypnea.  --Holding lisinopril 5 mg BID and HCTZ 25 mg QD  #Non-insulin-dependent diabetes mellitus: Chronic. Controlled. A1c 5.7 on admission. --Holding metformin 500 mg BID --CBG daily   #Hyperlipidemia: Chronic. On statin therapy. --Holding Lipitor 20 mg QD until able to take PO  #GERD: Chronic. Controlled on PPI. --Protonix 40 mg PO daily  FEN/GI: Soft diet, PPI Prophylaxis: Lovenox SQ  Disposition: Pending recovery s/p surgical removal of colonic mass with diverting colostomy. Expect d/c later today.  Subjective:  Patient says is well controlled. Worried about going home and not having home health PT.  Objective: Temp:  [97.7 F (36.5 C)-99.1 F (37.3 C)] 97.7 F (36.5 C) (04/11 0526) Pulse Rate:  [94-106] 94 (04/11 0526) Resp:  [18] 18 (04/11 0526) BP: (96-119)/(57-68) 96/63 (04/11 0526) SpO2:  [98 %] 98 % (04/11 0526) Physical Exam: General: well nourished, well developed, NAD CV: RRR without murmurs, rubs, or gallops Lungs: CTAB, no wheezing with normal work of breathing Abdomen: soft, minimally distended, minimal tenderness to palpation near site of incision without erythema or drainage, dressings intact, colostomy intact without bleeding or erythema, normoactive bowel sounds appreciated Extremities: warm and well perfused, normal tone, non-edematous   Laboratory:  Recent Labs Lab 09/23/16 0525 09/24/16 0453 09/25/16 0611  WBC 8.8 9.2 10.4  HGB 8.3* 7.8* 8.0*  HCT 25.1* 24.2* 25.2*  PLT 357 373 389    Recent Labs Lab 09/23/16 0525 09/24/16 0453 09/25/16 0611  NA 138 139 139  K 4.2 3.8 3.4*  CL 104 104 102  CO2 26 28 28   BUN <5* <5* <5*  CREATININE 0.35* 0.40* 0.36*  CALCIUM 8.5* 8.5* 8.6*  PROT 4.8* 4.8*  --   BILITOT 0.2* 0.2*  --   ALKPHOS 42 44  --   ALT 13* 19  --   AST 12* 21  --   GLUCOSE 100*  91 101*   Haptoglobin 361 (H) Lactic dehydrogenase 142 (WNL) Iron 9 (L) TIBC 249 (L) Ferritin 40 (WNL) Folate 12.3 (WNL) B12 669 (WNL) Retic 1.5 (WNL) Lipase: <10 (WNL) HIV: Nonreactive A1c: 5.7 (WNL)  CEA: 8.5 (H) Mag: 1.7 (WNL)  Imaging/Diagnostic Tests: DG Chest 2 View (09/17/2016) FINDINGS: Postsurgical changes of the lower cervical spine. Mildly diminished lung volumes. Mild bibasilar atelectasis. Borderline cardiomegaly, augmented by low lung volume. No pneumothorax. Dilated bowel in the upper abdomen.  IMPRESSION: 1. Low lung volumes with bibasilar atelectasis 2. Borderline cardiomegaly, exaggerated by low inspiration.  CT Abdomen Pelvis W Contrast (09/16/2016) FINDINGS: Lower chest: Dependent atelectasis is seen in the lung bases. No pleural or pericardial effusion. Hepatobiliary: The gallbladder is mildly distended. The liver and biliary tree appear normal. Pancreas: Unremarkable. No pancreatic ductal dilatation or surrounding inflammatory changes. Spleen: Normal in size without focal abnormality. Adrenals/Urinary Tract: Adrenal glands are unremarkable. Kidneys are normal, without renal calculi, focal lesion, or hydronephrosis. Bladder is unremarkable. Stomach/Bowel: There is marked distention of the colon with gas and stool to the distal sigmoid where the colon appears focally narrowed with an appearance worrisome for a mass lesion. The stomach, small bowel and appendix appear normal. Vascular/Lymphatic: Scattered aortoiliac atherosclerosis without aneurysm is identified. No lymphadenopathy. Reproductive: Status post hysterectomy. No adnexal masses. Other: No abdominal wall hernia or abnormality. No abdominopelvic ascites. Musculoskeletal: No acute abnormality. No lytic or sclerotic lesion.  IMPRESSION: Findings worrisome for a mass lesion in the distal sigmoid colon causing colonic obstruction. No evidence of metastatic disease identified. Atherosclerosis. Status  post hysterectomy.  DG Abd Portable 1V (09/16/2016) FINDINGS: Nasogastric tube is identified distal tip probably at the GE junction. Advancement is recommended. Air-filled distended colon is identified.  IMPRESSION: Nasogastric tube is identified distal tip probably at the GE junction, advancement is recommended. Air-filled distended colon is identified.  Corydon Bing, DO 09/25/2016, 7:07 AM PGY-1, [facility] Intern pager: [phone], text pages welcome

## 2016-09-25 NOTE — Progress Notes (Signed)
Transitions of Care Pharmacy Note  Plan:  -Educated on discontinuing PTA lisinopril and HCTZ at discharge -Counseled on oral iron therapy. --------------------------------------------- Leatrice Jewels is an 56 y.o. female who presents with a chief complaint constipation. In anticipation of discharge, pharmacy has reviewed this patient's prior to admission medication history, as well as current inpatient medications listed per the [facility].  Current medication indications, dosing, frequency, and notable side effects reviewed with patient. patient verbalized understanding of current inpatient medication regimen and is aware that the After Visit Summary when presented, will represent the most accurate medication list at discharge.    Assessment: Understanding of regimen: fair Understanding of indications: fair Potential of compliance: fair Barriers to Obtaining Medications: No  Patient instructed to contact inpatient pharmacy team with further questions or concerns if needed.    Time spent preparing for discharge counseling: 10 Time spent counseling patient: 10   Thank you for allowing pharmacy to be a part of this patient's care.  Arrie Senate, PharmD PGY-1 Pharmacy Resident Pager: [phone] 09/25/2016

## 2016-09-25 NOTE — Care Management Note (Addendum)
Case Management Note  Patient Details  Name: XIAO GRAUL MRN: [id number] Date of Birth: 1961/01/09  Subjective/Objective:                    Action/Plan:  Patient has home walker and 3 in 1 at bedside.   Provided and explained [place] letter . Patient voiced understanding. Tylenol #3 not covered by [facility] , MD changed to Percocet   Pain medication changed to Tramadol 100 mg tab, 14 day over ride entered in [facility] Expected Discharge Date:                  Expected Discharge Plan:  [place]  In-House Referral:     Discharge planning Services  CM Consult  Post Acute Care Choice:    Choice offered to:  Patient  DME Arranged:  3-N-1, Walker rolling DME Agency:  [place]:  RN [facility] Agency:  [place]  Status of Service:  Completed, signed off  If discussed at [place] of Stay Meetings, dates discussed:    Additional Comments:  Marilu Favre, RN 09/25/2016, 10:56 AM

## 2016-09-25 NOTE — Progress Notes (Signed)
Discharge instructions reviewed with patient.  She was able to review wound care and colostomy care with me and received handouts as a resource.  Comprehension of information was validated by "teach-back method".  Patient has met physical goals as set by patient and PT.  Other directions included the following:  Follow-up apts., medications, when to call the MD, and infection control.  Patient is awaiting brother to pick her up in private vehicle.

## 2016-09-25 NOTE — Progress Notes (Signed)
7 Days Post-Op  Subjective: Not allot of stool in bag, but continues to works and gas in the bag.  Open wound is still a bit purulent at the base.  Continue open wet to dry dressings BId  Objective: Vital signs in last 24 hours: Temp:  [97.7 F (36.5 C)-99.1 F (37.3 C)] 97.7 F (36.5 C) (04/11 0526) Pulse Rate:  [94-106] 94 (04/11 0526) Resp:  [18] 18 (04/11 0526) BP: (96-119)/(57-68) 96/63 (04/11 0526) SpO2:  [98 %] 98 % (04/11 0526) Last BM Date: 09/24/16 540 PO Urine 3 recorded Afebrile, VSS K+ 3.4 Intake/Output from previous day: 04/10 0701 - 04/11 0700 In: 540 [P.O.:540] Out: 3 [Urine:3] Intake/Output this shift: No intake/output data recorded.  General appearance: alert, cooperative and no distress Resp: clear to auscultation bilaterally GI: soft, ostomy working Open wound as noted above, some purulent drainage on dressing, at the base of the wound.  Ostomy OK, working with gas and some stool in the bag.    Lab Results:   Recent Labs  09/24/16 0453 09/25/16 0611  WBC 9.2 10.4  HGB 7.8* 8.0*  HCT 24.2* 25.2*  PLT 373 389    BMET  Recent Labs  09/24/16 0453 09/25/16 0611  NA 139 139  K 3.8 3.4*  CL 104 102  CO2 28 28  GLUCOSE 91 101*  BUN <5* <5*  CREATININE 0.40* 0.36*  CALCIUM 8.5* 8.6*   PT/INR No results for input(s): LABPROT, INR in the last 72 hours.   Recent Labs Lab 09/23/16 0525 09/24/16 0453  AST 12* 21  ALT 13* 19  ALKPHOS 42 44  BILITOT 0.2* 0.2*  PROT 4.8* 4.8*  ALBUMIN 1.9* 1.8*     Lipase     Component Value Date/Time   LIPASE <10 (L) 09/16/2016 0930     Studies/Results: No results found.  Medications: . enoxaparin (LOVENOX) injection  40 mg Subcutaneous Q24H  . ferrous sulfate  325 mg Oral TID WC  . pantoprazole  40 mg Oral Q0600  . potassium chloride  40 mEq Oral BID  . 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 Diverting colostomy, 09/18/16, Dr. Fanny Skates POD 7 Hypertension Anemia  Type 2 diabetes GERD Hyperlipidemia Status post C-section 2/abdominal hysterectomy FEN: IV fluids/soft diet ID: Cefotetan 2 gm q12h 09/18/16, =>>Day 5 completed.  DVT: Lovenox    Plan:  Continue wet to dry dressing changes at home, follow up in our office listed in AVS along with dressing changes. Home when ready per [facility].   LOS: 9 days    Mitsugi Schrader 09/25/2016 [phone]

## 2016-09-27 ENCOUNTER — Ambulatory Visit (INDEPENDENT_AMBULATORY_CARE_PROVIDER_SITE_OTHER): Payer: Self-pay | Admitting: Internal Medicine

## 2016-09-27 ENCOUNTER — Encounter: Payer: Self-pay | Admitting: Internal Medicine

## 2016-09-27 DIAGNOSIS — I10 Essential (primary) hypertension: Secondary | ICD-10-CM

## 2016-09-27 DIAGNOSIS — D638 Anemia in other chronic diseases classified elsewhere: Secondary | ICD-10-CM

## 2016-09-27 DIAGNOSIS — R5381 Other malaise: Secondary | ICD-10-CM

## 2016-09-27 DIAGNOSIS — E119 Type 2 diabetes mellitus without complications: Secondary | ICD-10-CM

## 2016-09-27 NOTE — Patient Instructions (Signed)
Continue taking your iron, we will check your blood work today. Please let me know when Medicaid comes through and I will put in a referral to physical therapy. If you have any other concerns at any time please give me a call. You do not need to take your metformin, lisinopril, hydrochlorothiazide.

## 2016-09-27 NOTE — Progress Notes (Signed)
   [facility] Kerrin Mo, MD Phone: [phone]  Reason For Visit: Hospital Follow up   1. General surgery 4/26 with Dr. Dalbert Batman concerning colon resection with diverging colostomy.   - Patient was discharged with 1 month supply probiotic - currently taking this no issues  -colostomy bag has been working well, home health nursing helping with changing this   2. Patient to follow-up with oncology 4/17 with Dr. Benay Spice. Of note, patient is under the impression she does not have cancer.   - confirmed appointment with patient  3. Pain control   - pain well controlled with tylenol, tramadol every once in a while  - Rechecking hemglobin, due to hx of patient's hemoglobin with downtrend  -  Has been taking iron. Denies any dizziness, palpations, SOB  4. PT recommended home health however not covered by Medicaid.  - Has rolling walker and portable potty - Nurse has come to the house to check the colostomy  - getting up walking around the house, has been doing exercise discussed with PT prior to discharge - Discussed patient getting set up with outpatient PT for follow up, once Medicaid is in place   5. Patient to restart metforminafter discharge. - Patient did not restart medication, would like to hold off on this   6. Blood pressure  -  HCTZ and Lisinopril were held due to low normal BP which patient continues to have at this visit. Likely due to patient's hx of weight loss  Patient instructed to restart Protonix and Lipitor. - No issues   Past Medical History Reviewed problem list.  Medications- reviewed and updated No additions to family history Social history- patient is a non smoker  Objective: BP (!) 110/52   Pulse 98   Temp 97.7 F (36.5 C) (Oral)   Ht 5\' 7"  (1.702 m)   Wt 130 lb 3.2 oz (59.1 kg)   SpO2 98%   BMI 20.39 kg/m  Gen: NAD, alert, cooperative with exam Cardio: regular rate and rhythm, S1S2 heard, no murmurs appreciated Pulm:  clear to auscultation bilaterally, no wheezes, rhonchi or rales GI: soft, non-tender, non-distended, bowel sounds present, colostomy bag in place with brown stool, no signs of infection   Assessment/Plan: See problem based a/p  HYPERTENSION, BENIGN ESSENTIAL Recent weight loss, no longer requiring blood pressure medications,  - discontinue blood pressure medications  - Diet and exercise controlled  - Follow up in 3 months   Anemia of chronic disease Iron deficiency anemia  - Has been taking iron without issue  - Denies any symptoms of anemia  - Will check CBC   Diabetes type 2, controlled (HCC) A1C 5.7 x 2 years, most likely due to weight loss  - Will d/c metformin  - Will recheck A1C in 6 months    Physical deconditioning Not able to get physical therapy due to lack of insurance  Patient has been doing exercise prescribed by PT in the hospital, trying to get around.  Will provide patient with outpatient physical therapy once medicaid kicks in

## 2016-09-28 LAB — CBC
HEMATOCRIT: 28.6 % — AB (ref 34.0–46.6)
Hemoglobin: 9.2 g/dL — ABNORMAL LOW (ref 11.1–15.9)
MCH: 24.6 pg — ABNORMAL LOW (ref 26.6–33.0)
MCHC: 32.2 g/dL (ref 31.5–35.7)
MCV: 77 fL — ABNORMAL LOW (ref 79–97)
PLATELETS: 671 10*3/uL — AB (ref 150–379)
RBC: 3.74 x10E6/uL — AB (ref 3.77–5.28)
RDW: 14.7 % (ref 12.3–15.4)
WBC: 11.1 10*3/uL — AB (ref 3.4–10.8)

## 2016-10-01 ENCOUNTER — Telehealth: Payer: Self-pay | Admitting: Oncology

## 2016-10-01 ENCOUNTER — Ambulatory Visit (HOSPITAL_BASED_OUTPATIENT_CLINIC_OR_DEPARTMENT_OTHER): Payer: Self-pay | Admitting: Oncology

## 2016-10-01 VITALS — BP 110/64 | HR 102 | Temp 98.4°F | Resp 17 | Ht 67.0 in | Wt 128.6 lb

## 2016-10-01 DIAGNOSIS — C20 Malignant neoplasm of rectum: Secondary | ICD-10-CM

## 2016-10-01 DIAGNOSIS — D509 Iron deficiency anemia, unspecified: Secondary | ICD-10-CM

## 2016-10-01 DIAGNOSIS — C189 Malignant neoplasm of colon, unspecified: Secondary | ICD-10-CM

## 2016-10-01 NOTE — Telephone Encounter (Signed)
Appointments scheduled per 4.17.18 LOS. Patient given AVS report and calendars with future scheduled appointments. °

## 2016-10-01 NOTE — Progress Notes (Signed)
Wanda Baker Patient Consult   Referring MD: Wanda Baker 56 y.o.  08-30-1960    Reason for Referral: Colon cancer   HPI: Wanda Baker reports a 3-4-day history of worsening constipation and nausea. She presented to the emergency room on 09/16/2016. A CT of the abdomen and pelvis on 09/16/2016 revealed marked distention of the colon with gas and stool to the distal sigmoid colon where there was evidence of a mass lesion. The stomach and small bowel appeared normal. No lymphadenopathy. No ascites.  Gastroenterology was consulted and she was taken to a flexible sigmoidoscopy on 09/17/2016. A completely obstructing mass was found at the rectosigmoid colon, measured at 15 cm. There was oozing. Biopsies were obtained and the pathology confirmed adenocarcinoma. Wanda Baker was consulted and she was taken to the operating room on 09/18/2016. The colon was distended. A palpable mass was noted below the peritoneal reflection. There was no evidence of metastatic disease. The tumor was resected and a diverting colostomy was created. The pathology ([id number]) confirmed an invasive moderately differentiated adenocarcinoma with extracellular mucin. Tumor invaded into pericolonic tissue. Resection margins were negative. 20 lymph nodes were negative for metastatic disease. No lymphovascular or perineural invasion. No macroscopic tumor perforation. The tumor returned MSI-stable with no loss of mismatch repair protein expression. The tumor was noted be located in the "rectum ".  She reports the colostomy is functioning well.   Past Medical History:  Diagnosis Date  . Adenocarcinoma of Rectum  09/23/2016  . Allergy    uses Nasocort daily and takes Singulair nightly   . Arthritis   . Chronic back pain    buldging disc  . Diabetes mellitus without complication (HCC)    taikes Metformin daily  . GERD (gastroesophageal reflux disease)    takes Omeprazole daily   . History  of bronchitis    many yrs ago  . Hyperlipidemia    takes Atorvastatin daily  . Hypertension    takes Lisinopril daily as well HCTZ  . Weakness    left arm    .  G2 P2  Past Surgical History:  Procedure Laterality Date  . ABDOMINAL HYSTERECTOMY  2011   Supracervical Hysterectomy  . ANTERIOR CERVICAL DECOMP/DISCECTOMY FUSION N/A 12/02/2013   Procedure: ANTERIOR CERVICAL DECOMPRESSION/DISCECTOMY FUSION 3 LEVELS;  Surgeon: Erline Levine, MD;  Location: [place] [facility];  Service: Neurosurgery;  Laterality: N/A;  C4-5 C5-6 C6-7 Anterior cervical decompression/diskectomy/fusion  . BOWEL RESECTION N/A 09/18/2016   Procedure: LOW ANTERIOR COLON RESECTION WITH COLOSTOMY;  Surgeon: Fanny Skates, MD;  Location: [place];  Service: General;  Laterality: N/A;  . CESAREAN SECTION  86/88  . FLEXIBLE SIGMOIDOSCOPY N/A 09/17/2016   Procedure: FLEXIBLE SIGMOIDOSCOPY;  Surgeon: Wanda Pole, MD;  Location: [place];  Service: Endoscopy;  Laterality: N/A;  . TUBAL LIGATION      Medications: Reviewed  Allergies:  Allergies  Allergen Reactions  . Prednisone     REACTION: Rash  . Vicodin [Hydrocodone-Acetaminophen] Other (See Comments)    dizziness  . Penicillins Swelling and Rash    Has patient had a PCN reaction causing immediate rash, facial/tongue/throat swelling, SOB or lightheadedness with hypotension: Yes Has patient had a PCN reaction causing severe rash involving mucus membranes or skin necrosis: No Has patient had a PCN reaction that required hospitalization No Has patient had a PCN reaction occurring within the last 10 years: No If all of the above answers are "NO", then may proceed with Cephalosporin  use.     Family history: A paternal uncle had head and neck cancer and was a smoker. Another paternal uncle had "cancer ". She has one sister and one brother. 2 children. No other family history of cancer.  Social History: She works in [employer]. She lives with her mother and sons. She  quit smoking cigarettes 7 years ago. No alcohol use. No transfusion history. No risk factor for HIV or hepatitis.    ROS:   Positives include: Anorexia prior to surgery, 26 pound weight loss, nausea and constipation prior to surgery  A complete ROS was otherwise negative.  Physical Exam:  Blood pressure 110/64, pulse (!) 102, temperature 98.4 F (36.9 C), temperature source Oral, resp. rate 17, height 5' 7"  (1.702 m), weight 128 lb 9.6 oz (58.3 kg), SpO2 96 %.  HEENT: Oropharynx without visible mass, neck without mass Lungs: Clear bilaterally Cardiac: Regular rate and rhythm Abdomen: No hepatosplenomegaly, midline incision with a packing in place  Vascular: No leg edema Lymph nodes: No cervical, supraclavicular, axillary, or inguinal nodes Neurologic: Alert and oriented, the motor exam appears intact in the upper and lower extremities Skin: No rash Musculoskeletal: No spine tenderness   LAB:  CBC  Lab Results  Component Value Date   WBC 11.1 (H) 09/27/2016   HGB 8.0 (L) 09/25/2016   HCT 28.6 (L) 09/27/2016   MCV 77 (L) 09/27/2016   PLT 671 (H) 09/27/2016     CMP      Component Value Date/Time   NA 139 09/25/2016 0611   K 3.4 (L) 09/25/2016 0611   CL 102 09/25/2016 0611   CO2 28 09/25/2016 0611   GLUCOSE 101 (H) 09/25/2016 0611   BUN <5 (L) 09/25/2016 0611   CREATININE 0.36 (L) 09/25/2016 0611   CREATININE 0.51 09/21/2015 1502   CALCIUM 8.6 (L) 09/25/2016 0611   PROT 4.8 (L) 09/24/2016 0453   ALBUMIN 1.8 (L) 09/24/2016 0453   AST 21 09/24/2016 0453   ALT 19 09/24/2016 0453   ALKPHOS 44 09/24/2016 0453   BILITOT 0.2 (L) 09/24/2016 0453   GFRNONAA >60 09/25/2016 0611   GFRNONAA >89 09/21/2015 1502   GFRAA >60 09/25/2016 0611   GFRAA >89 09/21/2015 1502    Lab Results  Component Value Date   CEA 8.5 (H) 09/17/2016    Imaging:  CT abdomen/pelvis 09/16/2016-images reviewed with Wanda Baker and her family   Assessment/Plan:   1. Adenocarcinoma the  rectum stage II (T3 N0), status post a low anterior resection and diverting colostomy 09/18/2016-mass noted just below the peritoneal reflection  Tumor noted at 15 cm on sigmoidoscopy 09/17/2016  CT abdomen/pelvis 09/16/2016-colon obstruction, mass at the "distal sigmoid colon "  Elevated preoperative CEA  MSI-stable, no loss of mismatch repair protein expression  0/22 lymph nodes positive, extracellular mucin without tumor on the inked serosal surface  2.   Colonic obstruction secondary to #1  3.   Iron deficiency anemia secondary to #1   Disposition:   Ms. Vila has been diagnosed with stage II adenocarcinoma of the high rectum. I reviewed the details of the surgical pathology report and discussed adjuvant treatment options with Ms. Satre and her family.  She has a good prognosis for a long-term disease-free survival. I doubt adjuvant radiation will be recommended based on the negative resection margins and high rectum location. I will present her case at the GI tumor conference and discussed the case with radiation oncology.  We discussed the lack of clear benefit with  adjuvant chemotherapy in the majority of patients with resected stage II colorectal cancer. Her tumor does not have "high risk "features except for the clinical presentation with a bowel obstruction.  We discussed the potential small absolute benefit from adjuvant capecitabine chemotherapy. I reviewed the potential toxicities associated with capecitabine. Ms. Erby is most comfortable with observation.  She does not appear to have hereditary non-polyposis colon cancer syndrome. Her family members are at increased risk of developing colon cancer. She will be sure they undergo appropriate screening.  We discussed diet and exercise maneuvers that may decrease the risk of developing colon cancer.  She should undergo a completion colonoscopy are to the colostomy reversal.  She will return for a CBC and CEA in 3 weeks.  Ms. Dehaan will be scheduled for an office visit and CEA in 6 months.  I will see her sooner if adjuvant radiation is recommended from discussion at the GI tumor conference.  50 minutes were spent with the patient today. The majority of the time was used for counseling and coordination of care.  [name], MD  10/01/2016, 3:46 PM

## 2016-10-02 ENCOUNTER — Telehealth: Payer: Self-pay | Admitting: Oncology

## 2016-10-02 DIAGNOSIS — R5381 Other malaise: Secondary | ICD-10-CM | POA: Insufficient documentation

## 2016-10-02 NOTE — Assessment & Plan Note (Signed)
Iron deficiency anemia  - Has been taking iron without issue  - Denies any symptoms of anemia  - Will check CBC

## 2016-10-02 NOTE — Telephone Encounter (Signed)
sw pt to confirm MD appt 4/24 at 4 pm per LOS

## 2016-10-02 NOTE — Assessment & Plan Note (Addendum)
Not able to get physical therapy due to lack of insurance  Patient has been doing exercise prescribed by PT in the hospital, trying to get around.  Will provide patient with outpatient physical therapy once medicaid kicks in

## 2016-10-02 NOTE — Assessment & Plan Note (Signed)
A1C 5.7 x 2 years, most likely due to weight loss  - Will d/c metformin  - Will recheck A1C in 6 months

## 2016-10-02 NOTE — Assessment & Plan Note (Signed)
Recent weight loss, no longer requiring blood pressure medications,  - discontinue blood pressure medications  - Diet and exercise controlled  - Follow up in 3 months

## 2016-10-03 ENCOUNTER — Encounter: Payer: Self-pay | Admitting: Internal Medicine

## 2016-10-04 ENCOUNTER — Encounter: Payer: Self-pay | Admitting: Radiation Oncology

## 2016-10-04 ENCOUNTER — Telehealth: Payer: Self-pay | Admitting: *Deleted

## 2016-10-04 NOTE — Telephone Encounter (Signed)
Message received from operator to call patient's mother back with question.  Call placed back to patient's mother and she wanted to confirm patient's appt time for next Tuesday.  Patient's schedule reviewed with pt.  Patient's mother appreciative of call back.

## 2016-10-08 ENCOUNTER — Ambulatory Visit (HOSPITAL_BASED_OUTPATIENT_CLINIC_OR_DEPARTMENT_OTHER): Payer: Self-pay | Admitting: Oncology

## 2016-10-08 ENCOUNTER — Telehealth: Payer: Self-pay | Admitting: Oncology

## 2016-10-08 ENCOUNTER — Encounter: Payer: Self-pay | Admitting: Oncology

## 2016-10-08 VITALS — BP 129/66 | HR 98 | Temp 98.6°F | Resp 18 | Ht 67.0 in | Wt 129.5 lb

## 2016-10-08 DIAGNOSIS — C20 Malignant neoplasm of rectum: Secondary | ICD-10-CM

## 2016-10-08 DIAGNOSIS — D630 Anemia in neoplastic disease: Secondary | ICD-10-CM

## 2016-10-08 MED ORDER — CAPECITABINE 500 MG PO TABS: 1500.0000 mg | ORAL_TABLET | Freq: Two times a day (BID) | ORAL | 0 refills | Status: DC

## 2016-10-08 NOTE — Addendum Note (Signed)
Addended by: Brien Few on: 10/08/2016 05:27 PM   Modules accepted: Orders

## 2016-10-08 NOTE — Progress Notes (Signed)
  Wanda Baker   Diagnosis: Rectal cancer  INTERVAL HISTORY:   Wanda Baker returns for further discussion regarding the rectal cancer diagnosis. I contacted her by telephone after the office visit on 10/01/2016. After further review of the operative Baker, pathology, and endoscopy findings it appears the tumor is a high rectal cancer.  She has no new complaint. She reports the abdominal wound is healing.  Objective:  Vital signs in last 24 hours:  Blood pressure 129/66, pulse 98, temperature 98.6 F (37 C), temperature source Oral, resp. rate 18, height _0  (1.702 m), weight 129 lb 8 oz (58.7 kg), SpO2 98 %.   GI: The abdominal wound is open with a packing in place. Pink granulation tissue. The wound is superficial. No evidence of infection.   Lab Results:  09/27/2016: Heme of 9.2, MCV 77  Lab Results  Component Value Date   CEA 8.5 (H) 09/17/2016     Medications: I have reviewed the patient's current medications.  Assessment/Plan: 1. 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 Tumor noted at 15 cm on sigmoidoscopy 09/17/2016 ? CT abdomen/pelvis 09/16/2016-colon obstruction, mass at the "distal sigmoid colon " ? Elevated preoperative CEA ? MSI-stable, no loss of mismatch repair protein expression ? 0/22 lymph nodes positive, extracellular mucin without tumor on the inked serosal surface  2.   Colonic obstruction secondary to #1  3.   Iron deficiency anemia secondary to #1    Disposition:  Wanda Baker appears well. She appears to have a tumor that originated in the high rectum. I discussed the diagnosis and adjuvant treatment options with Wanda Baker and multiple family members. I explained the rationale for adjuvant chemotherapy/radiation in patients with resected stage II rectal cancer. The standard of care is  to complete a course of adjuvant chemotherapy and radiation.  I recommend concurrent capecitabine and radiation. We again reviewed the potential toxicities associated with capecitabine including the chance for mucositis, diarrhea, and hematologic toxicity. We discussed the sun sensitivity, hyperpigmentation, and hand/foot syndrome associated with capecitabine. She agrees to proceed. She will attend a chemotherapy teaching class. She is scheduled to see Dr. Lisbeth Renshaw on 10/14/2016. I anticipate treatment will begin on 10/21/2016.  She will take capecitabine on days of radiation only.  25 minutes were spent with the patient today. The majority of the time was used for counseling and coordination of care.  [name], MD  10/08/2016  4:21 PM

## 2016-10-08 NOTE — Progress Notes (Signed)
Met with patient and her brother. Advised patient of one-time $90 [place] which approved for. Patient signed approval and was given an expense sheet as well as our outpatient pharmacy information. Patient was also given my card for any additional financial questions or concerns. Explained the grant in detail and how it may be used.

## 2016-10-08 NOTE — Telephone Encounter (Signed)
Gave patient avs report and appointments for April and May. CHED class scheduled for 4/30 at 10am instead of 12 noon - 12 noon not an available time.

## 2016-10-08 NOTE — Progress Notes (Signed)
GI Location of Tumor / Histology: rectosigmoid colon  Wanda Baker presented on 09/16/2016 with symptoms of: constipation and nausea  Biopsies of colon (if applicable) revealed: FINAL DIAGNOSIS 09/18/2016 Diagnosis 1. Colon, segmental resection for tumor, Rectal Sigmoid - INVASIVE ADENOCARCINOMA WITH EXTRACELLULAR MUCIN, MODERATELY DIFFERENTIATED, SPANNING 5 CM. - TUMOR INVADES INTO PERICOLONIC TISSUE, SEE COMMENT. - RESECTION MARGINS ARE NEGATIVE. - TWENTY OF TWENTY LYMPH NODES NEGATIVE FOR MALIGNANCY (0/20). - SEE ONCOLOGY TABLE. 2. Colon, segmental resection, additional proximal sigmoid - BENIGN COLONIC MUCOSA WITH FOCAL NECROSIS AND ACUTE INFLAMMATION. - NO DYSPLASIA OR MALIGNANCY.   Past/Anticipated interventions by surgeon, if any:  09/18/2016 Procedure: LOW ANTERIOR COLON RESECTION WITH COLOSTOMY;  Surgeon: Fanny Skates, MD;  Location: [place];  Service: General;  Laterality: N/A;  Past/Anticipated interventions by medical oncology, if any:  10/01/2016 Per Dr. Benay Spice *We discussed the potential small absolute benefit from adjuvant capecitabine chemotherapy. I reviewed the potential toxicities associated with capecitabine. [name] is most comfortable with observation.  Weight changes, if any:  Wt Readings from Last 3 Encounters:  10/14/16 125 lb (56.7 kg)  10/08/16 129 lb 8 oz (58.7 kg)  10/01/16 128 lb 9.6 oz (58.3 kg)   Bowel/Bladder complaints, if any: None, Has a colostomy having a bowel movement daily Issues with colonoscopy?:None   Nausea / Vomiting, if any: NO   Pain issues, if any: None, when does have pain usually it's in her back.  Any blood per rectum: None    SAFETY ISSUES:  Prior radiation? No  Pacemaker/ICD? No  Possible current pregnancy? No  Is the patient on methotrexate? No  Current Complaints/Details:open midline wound  Visiting nurses helping with ostomy and packing wound BP 126/73   Pulse 93   Temp 98.3 F (36.8 C) (Oral)    Resp 16   Ht 5\' 7"  (1.702 m)   Wt 125 lb (56.7 kg)   SpO2 98%   BMI 19.58 kg/m

## 2016-10-10 ENCOUNTER — Encounter: Payer: Self-pay | Admitting: *Deleted

## 2016-10-10 NOTE — Progress Notes (Signed)
[place] Work  Clinical Social Work was referred by patient's brother for assessment of psychosocial needs due to transportation concerns.  Clinical Social Worker contacted patient's brother as requested to offer support and assess for needs.  CSW introduced self, explained role of CSW/Pt and Family Support Team, support groups and other resources to assist. CSW reviewed transportation options to assist and provided brother with ACS contact information. Pt is in process of applying for medicaid and was approved for grant. CSW discussed option for SCAT to assist with transport during radiation. Pt and brother would like to meet with CSW and complete SCAT, review additional resources at appointments on 10/14/16. CSW team member to check in with pt and family at chemo ed, lab or radonc appointments to assist.    Clinical Social Work interventions:  Resource education and referral Loren Racer, LCSW, OSW-C Clinical Social Worker [place]  [place] Phone: [phone] Fax: [phone]

## 2016-10-14 ENCOUNTER — Ambulatory Visit
Admission: RE | Admit: 2016-10-14 | Discharge: 2016-10-14 | Disposition: A | Discharge status: Home / Self Care | Payer: Medicaid Other | Source: Ambulatory Visit | Attending: Radiation Oncology | Admitting: Radiation Oncology

## 2016-10-14 ENCOUNTER — Encounter: Payer: Self-pay | Admitting: Radiation Oncology

## 2016-10-14 ENCOUNTER — Encounter: Payer: Self-pay | Admitting: *Deleted

## 2016-10-14 ENCOUNTER — Ambulatory Visit: Payer: Self-pay

## 2016-10-14 ENCOUNTER — Other Ambulatory Visit (HOSPITAL_BASED_OUTPATIENT_CLINIC_OR_DEPARTMENT_OTHER): Payer: Self-pay

## 2016-10-14 ENCOUNTER — Telehealth: Payer: Self-pay | Admitting: Pharmacist

## 2016-10-14 VITALS — BP 126/73 | HR 93 | Temp 98.3°F | Resp 16 | Ht 67.0 in | Wt 125.0 lb

## 2016-10-14 DIAGNOSIS — Z510 Encounter for antineoplastic radiation therapy: Secondary | ICD-10-CM | POA: Insufficient documentation

## 2016-10-14 DIAGNOSIS — Z888 Allergy status to other drugs, medicaments and biological substances status: Secondary | ICD-10-CM | POA: Insufficient documentation

## 2016-10-14 DIAGNOSIS — K219 Gastro-esophageal reflux disease without esophagitis: Secondary | ICD-10-CM | POA: Diagnosis not present

## 2016-10-14 DIAGNOSIS — Z87891 Personal history of nicotine dependence: Secondary | ICD-10-CM | POA: Diagnosis not present

## 2016-10-14 DIAGNOSIS — Z9889 Other specified postprocedural states: Secondary | ICD-10-CM | POA: Diagnosis not present

## 2016-10-14 DIAGNOSIS — Z833 Family history of diabetes mellitus: Secondary | ICD-10-CM | POA: Diagnosis not present

## 2016-10-14 DIAGNOSIS — I119 Hypertensive heart disease without heart failure: Secondary | ICD-10-CM | POA: Diagnosis not present

## 2016-10-14 DIAGNOSIS — Z880 Allergy status to penicillin: Secondary | ICD-10-CM | POA: Insufficient documentation

## 2016-10-14 DIAGNOSIS — Z90710 Acquired absence of both cervix and uterus: Secondary | ICD-10-CM | POA: Diagnosis not present

## 2016-10-14 DIAGNOSIS — E785 Hyperlipidemia, unspecified: Secondary | ICD-10-CM | POA: Diagnosis not present

## 2016-10-14 DIAGNOSIS — G8929 Other chronic pain: Secondary | ICD-10-CM | POA: Insufficient documentation

## 2016-10-14 DIAGNOSIS — Z8249 Family history of ischemic heart disease and other diseases of the circulatory system: Secondary | ICD-10-CM | POA: Diagnosis not present

## 2016-10-14 DIAGNOSIS — Z933 Colostomy status: Secondary | ICD-10-CM | POA: Diagnosis not present

## 2016-10-14 DIAGNOSIS — C20 Malignant neoplasm of rectum: Secondary | ICD-10-CM

## 2016-10-14 DIAGNOSIS — C182 Malignant neoplasm of ascending colon: Secondary | ICD-10-CM

## 2016-10-14 DIAGNOSIS — E119 Type 2 diabetes mellitus without complications: Secondary | ICD-10-CM | POA: Diagnosis not present

## 2016-10-14 DIAGNOSIS — Z79899 Other long term (current) drug therapy: Secondary | ICD-10-CM | POA: Insufficient documentation

## 2016-10-14 DIAGNOSIS — D630 Anemia in neoplastic disease: Secondary | ICD-10-CM

## 2016-10-14 LAB — CBC WITH DIFFERENTIAL/PLATELET
BASO%: 0.2 % (ref 0.0–2.0)
BASOS ABS: 0 10*3/uL (ref 0.0–0.1)
EOS%: 2.7 % (ref 0.0–7.0)
Eosinophils Absolute: 0.3 10*3/uL (ref 0.0–0.5)
HEMATOCRIT: 35.5 % (ref 34.8–46.6)
HEMOGLOBIN: 11.2 g/dL — AB (ref 11.6–15.9)
LYMPH#: 2.8 10*3/uL (ref 0.9–3.3)
LYMPH%: 28.9 % (ref 14.0–49.7)
MCH: 24.9 pg — ABNORMAL LOW (ref 25.1–34.0)
MCHC: 31.5 g/dL (ref 31.5–36.0)
MCV: 78.9 fL — AB (ref 79.5–101.0)
MONO#: 0.7 10*3/uL (ref 0.1–0.9)
MONO%: 7.3 % (ref 0.0–14.0)
NEUT#: 5.9 10*3/uL (ref 1.5–6.5)
NEUT%: 60.9 % (ref 38.4–76.8)
Platelets: 398 10*3/uL (ref 145–400)
RBC: 4.5 10*6/uL (ref 3.70–5.45)
RDW: 15.9 % — AB (ref 11.2–14.5)
WBC: 9.7 10*3/uL (ref 3.9–10.3)

## 2016-10-14 LAB — COMPREHENSIVE METABOLIC PANEL
ALT: 11 U/L (ref 0–55)
ANION GAP: 10 meq/L (ref 3–11)
AST: 12 U/L (ref 5–34)
Albumin: 3.6 g/dL (ref 3.5–5.0)
Alkaline Phosphatase: 81 U/L (ref 40–150)
BUN: 9.1 mg/dL (ref 7.0–26.0)
CALCIUM: 10.2 mg/dL (ref 8.4–10.4)
CHLORIDE: 105 meq/L (ref 98–109)
CO2: 26 mEq/L (ref 22–29)
CREATININE: 0.6 mg/dL (ref 0.6–1.1)
Glucose: 125 mg/dl (ref 70–140)
POTASSIUM: 3.9 meq/L (ref 3.5–5.1)
SODIUM: 142 meq/L (ref 136–145)
TOTAL PROTEIN: 7.5 g/dL (ref 6.4–8.3)
Total Bilirubin: 0.32 mg/dL (ref 0.20–1.20)

## 2016-10-14 LAB — CEA (IN HOUSE-CHCC)

## 2016-10-14 MED ORDER — CAPECITABINE 500 MG PO TABS: 1500.0000 mg | ORAL_TABLET | Freq: Two times a day (BID) | ORAL | 0 refills | Status: DC

## 2016-10-14 MED ORDER — PROCHLORPERAZINE MALEATE 10 MG PO TABS: 10.0000 mg | ORAL_TABLET | Freq: Four times a day (QID) | ORAL | 0 refills | Status: DC | PRN

## 2016-10-14 NOTE — Telephone Encounter (Signed)
Oral Chemotherapy Pharmacist Encounter  Received new prescription for Xeloda for patient to take concurrently with radiation for rectal cancer. Labs from 10/14/16 reviewed, OK for treatment Current medication list in Epic assessed, no significant DDIs with Xeloda identified.  Prescription e-scribed to [facility]. Noted patient without insurance. She has been approved for the [place]. Radiation not yet scheduled, may not start for 2 weeks per RadOnc consult note from today (4/30).  Oral Oncology Clinic will continue to follow.  Johny Drilling, PharmD, BCPS, BCOP 10/14/2016  4:47 PM Oral Oncology Clinic [phone]

## 2016-10-14 NOTE — Progress Notes (Signed)
Radiation Oncology         [phone] ________________________________  Name: Wanda Baker MRN: [id number]  Date: 10/14/2016  DOB: 1960-11-03  BH:ALPFXT Z Emmaline Life, MD  Ladell Pier, MD     REFERRING PHYSICIAN: Ladell Pier, MD  DIAGNOSIS: The encounter diagnosis was Rectal cancer [facility]). Adenocarcinoma of the rectum, stage II, T3, N0.  HISTORY OF PRESENT ILLNESS: Wanda Baker is a 57 y.o. female seen at the request of Dr. Benay Spice. The patient presented to the emergency room on 09/16/16 with constipation and nausea for 5 days. CT of the abdomen and pelvis on 09/16/16 showed marked distention of the colon with gas and stool to the distal sigmoid with an appearance worrisome for a mass lesion. Patient underwent a flexible sigmoidoscopy on 09/17/16. This revealed a completely obstructing mass at the rectosigmoid colon measuring 15 cm.  Patient underwent a low anterior colon resection with colostomy on 09/18/16. Biopsy of the rectal sigmoid showed invasive adenocarcinoma with extracellular mucin, spanning 5 cm. Resection margins were negative and all lymph nodes were negative for malignancy (0/20). Biopsy of the proximal sigmoid showed benign colonic mucosa with focal necrosis and acute inflammation. She has met with Dr. Benay Spice and due to the location of her disease being below the peritoneal reflection, recommendations have been made for her to proceed with concurrent chemotherapy and radiotherapy. She comes today to discuss this further.   PREVIOUS RADIATION THERAPY: No   PAST MEDICAL HISTORY:  Past Medical History:  Diagnosis Date  . Adenocarcinoma of colon ([place]) 09/23/2016  . Allergy    uses Nasocort daily and takes Singulair nightly   . Arthritis   . Chronic back pain    buldging disc  . Diabetes mellitus without complication (HCC)    taikes Metformin daily  . GERD (gastroesophageal reflux disease)    takes Omeprazole daily   . History of bronchitis    many yrs ago  .  Hyperlipidemia    takes Atorvastatin daily  . Hypertension    takes Lisinopril daily as well HCTZ  . Weakness    left arm       PAST SURGICAL HISTORY: Past Surgical History:  Procedure Laterality Date  . ABDOMINAL HYSTERECTOMY  2011   Supracervical Hysterectomy  . ANTERIOR CERVICAL DECOMP/DISCECTOMY FUSION N/A 12/02/2013   Procedure: ANTERIOR CERVICAL DECOMPRESSION/DISCECTOMY FUSION 3 LEVELS;  Surgeon: Erline Levine, MD;  Location: [place] [facility];  Service: Neurosurgery;  Laterality: N/A;  C4-5 C5-6 C6-7 Anterior cervical decompression/diskectomy/fusion  . BOWEL RESECTION N/A 09/18/2016   Procedure: LOW ANTERIOR COLON RESECTION WITH COLOSTOMY;  Surgeon: Fanny Skates, MD;  Location: [place];  Service: General;  Laterality: N/A;  . CESAREAN SECTION  86/88  . FLEXIBLE SIGMOIDOSCOPY N/A 09/17/2016   Procedure: FLEXIBLE SIGMOIDOSCOPY;  Surgeon: Mauri Pole, MD;  Location: [facility];  Service: Endoscopy;  Laterality: N/A;  . TUBAL LIGATION       FAMILY HISTORY:  Family History  Problem Relation Age of Onset  . Heart disease Maternal Grandmother   . Diabetes Maternal Grandmother   . Hypertension Maternal Grandmother   . Heart disease Maternal Grandfather   . Heart disease Paternal Grandmother   . Diabetes Paternal Grandmother   . Alzheimer's disease Paternal Grandmother      SOCIAL HISTORY:  reports that she has quit smoking. She has never used smokeless tobacco. She reports that she does not drink alcohol or use drugs. the patient is divorced and lives in [place] with her mother. She's  accompanied by her brother. She is on short term disability but works at [employer].    ALLERGIES: Prednisone; Vicodin [hydrocodone-acetaminophen]; and Penicillins   MEDICATIONS:  Current Outpatient Prescriptions  Medication Sig Dispense Refill  . ferrous sulfate 325 (65 FE) MG tablet Take 1 tablet (325 mg total) by mouth 3 (three) times daily with meals. 90 tablet 0  . gabapentin (NEURONTIN)  300 MG capsule Take 300 mg by mouth 3 (three) times daily.    [name] Kitchen saccharomyces boulardii (FLORASTOR) 250 MG capsule Take 1 capsule (250 mg total) by mouth 2 (two) times daily. 60 capsule 0  . capecitabine (XELODA) 500 MG tablet Take 3 tablets (1,500 mg total) by mouth 2 (two) times daily after a meal. On days of radiation only. (Mon-Fri) (Patient not taking: Reported on 10/14/2016) 168 tablet 0  . cetirizine (ZYRTEC) 10 MG tablet Take 10 mg by mouth daily.    . montelukast (SINGULAIR) 10 MG tablet TAKE 1 TABLET BY MOUTH AT BEDTIME (Patient not taking: Reported on 09/16/2016) 30 tablet 11  . omeprazole (PRILOSEC) 20 MG capsule TAKE ONE CAPSULE BY MOUTH EVERY DAY (Patient not taking: Reported on 09/16/2016) 30 capsule 5  . prochlorperazine (COMPAZINE) 10 MG tablet Take 1 tablet (10 mg total) by mouth every 6 (six) hours as needed for nausea or vomiting. (Patient not taking: Reported on 10/14/2016) 30 tablet 0  . traMADol (ULTRAM) 50 MG tablet Take 2 tablets (100 mg total) by mouth every 6 (six) hours as needed for moderate pain. (Patient not taking: Reported on 10/14/2016) 30 tablet 0  . triamcinolone (NASACORT AQ) 55 MCG/ACT nasal inhaler Place 2 sprays into the nose daily. (Patient not taking: Reported on 09/16/2016) 1 Inhaler 12   No current facility-administered medications for this encounter.      REVIEW OF SYSTEMS: On review of systems, the patient reports that she is doing well overall. Patient reports a 25 pound weight loss in the past month. She denies any chest pain, shortness of breath, cough, fevers, chills, or night sweats. Patient has a colostomy. She denies any bowel or bladder disturbances, and denies abdominal pain, nausea or vomiting. She reports occasional back pain. She denies any new musculoskeletal or joint aches or pains. A complete review of systems is obtained and is otherwise negative.     PHYSICAL EXAM:  Wt Readings from Last 3 Encounters:  10/14/16 125 lb (56.7 kg)  10/08/16 129  lb 8 oz (58.7 kg)  10/01/16 128 lb 9.6 oz (58.3 kg)   Temp Readings from Last 3 Encounters:  10/14/16 98.3 F (36.8 C) (Oral)  10/08/16 98.6 F (37 C) (Oral)  10/01/16 98.4 F (36.9 C) (Oral)   BP Readings from Last 3 Encounters:  10/14/16 126/73  10/08/16 129/66  10/01/16 110/64   Pulse Readings from Last 3 Encounters:  10/14/16 93  10/08/16 98  10/01/16 (!) 102   Pain Assessment Pain Score: 0-No pain/10  In general this is a well appearing caucasian woman in no acute distress. She is alert and oriented x4 and appropriate throughout the examination. HEENT reveals that the patient is normocephalic, atraumatic. EOMs are intact. PERRLA. Skin is intact without any evidence of gross lesions. Cardiovascular exam reveals a regular rate and rhythm, no clicks rubs or murmurs are auscultated. Chest is clear to auscultation bilaterally. Lymphatic assessment is performed and does not reveal any adenopathy in the cervical, supraclavicular, axillary, or inguinal chains. Abdomen has active bowel sounds in all quadrants and is intact. The abdomen is soft,  non tender, non distended. Lower extremities are negative for pretibial pitting edema, deep calf tenderness, cyanosis or clubbing. The ostomy bag is intact with a pink stoma and mild gas in the bag. Her midline laparotomy site is visualized and separation of the incision site is noted with granulation tissue in the wound bed. No purulent matter is noted.    ECOG = 1  0 - Asymptomatic (Fully active, able to carry on all predisease activities without restriction)  1 - Symptomatic but completely ambulatory (Restricted in physically strenuous activity but ambulatory and able to carry out work of a light or sedentary nature. For example, light housework, office work)  2 - Symptomatic, <50% in bed during the day (Ambulatory and capable of all self care but unable to carry out any work activities. Up and about more than 50% of waking hours)  3 -  Symptomatic, >50% in bed, but not bedbound (Capable of only limited self-care, confined to bed or chair 50% or more of waking hours)  4 - Bedbound (Completely disabled. Cannot carry on any self-care. Totally confined to bed or chair)  5 - Death   Eustace Pen MM, Creech RH, Tormey DC, et al. [phone]). "Toxicity and response criteria of the [facility]". [place] Oncol. 5 (6): 649-55    LABORATORY DATA:  Lab Results  Component Value Date   WBC 9.7 10/14/2016   HGB 11.2 (L) 10/14/2016   HCT 35.5 10/14/2016   MCV 78.9 (L) 10/14/2016   PLT 398 10/14/2016   Lab Results  Component Value Date   NA 142 10/14/2016   K 3.9 10/14/2016   CL 102 09/25/2016   CO2 26 10/14/2016   Lab Results  Component Value Date   ALT 11 10/14/2016   AST 12 10/14/2016   ALKPHOS 81 10/14/2016   BILITOT 0.32 10/14/2016      RADIOGRAPHY: Dg Chest 2 View  Result Date: 09/17/2016 CLINICAL DATA:  Preop history of diabetes and hypertension, colon mass EXAM: CHEST  2 VIEW COMPARISON:  11/24/2013 FINDINGS: Postsurgical changes of the lower cervical spine. Mildly diminished lung volumes. Mild bibasilar atelectasis. Borderline cardiomegaly, augmented by low lung volume. No pneumothorax. Dilated bowel in the upper abdomen. IMPRESSION: 1. Low lung volumes with bibasilar atelectasis 2. Borderline cardiomegaly, exaggerated by low inspiration. Electronically Signed   By: Donavan Foil M.D.   On: 09/17/2016 23:40   Ct Abdomen Pelvis W Contrast  Result Date: 09/16/2016 CLINICAL DATA:  Abdominal pain, constipation and nausea for 5 days. EXAM: CT ABDOMEN AND PELVIS WITH CONTRAST TECHNIQUE: Multidetector CT imaging of the abdomen and pelvis was performed using the standard protocol following bolus administration of intravenous contrast. CONTRAST:  100 ml ISOVUE-300 IOPAMIDOL (ISOVUE-300) INJECTION 61% COMPARISON:  None. FINDINGS: Lower chest: Dependent atelectasis is seen in the lung bases. No pleural or  pericardial effusion. Hepatobiliary: The gallbladder is mildly distended. The liver and biliary tree appear normal. Pancreas: Unremarkable. No pancreatic ductal dilatation or surrounding inflammatory changes. Spleen: Normal in size without focal abnormality. Adrenals/Urinary Tract: Adrenal glands are unremarkable. Kidneys are normal, without renal calculi, focal lesion, or hydronephrosis. Bladder is unremarkable. Stomach/Bowel: There is marked distention of the colon with gas and stool to the distal sigmoid where the colon appears focally narrowed with an appearance worrisome for a mass lesion. The stomach, small bowel and appendix appear normal. Vascular/Lymphatic: Scattered aortoiliac atherosclerosis without aneurysm is identified. No lymphadenopathy. Reproductive: Status post hysterectomy. No adnexal masses. Other: No abdominal wall hernia or abnormality. No  abdominopelvic ascites. Musculoskeletal: No acute abnormality. No lytic or sclerotic lesion. IMPRESSION: Findings worrisome for a mass lesion in the distal sigmoid colon causing colonic obstruction. No evidence of metastatic disease identified. Atherosclerosis. Status post hysterectomy. Electronically Signed   By: Inge Rise M.D.   On: 09/16/2016 11:08   Dg Abd Portable 1v  Result Date: 09/16/2016 CLINICAL DATA:  Nasogastric tube placement. EXAM: PORTABLE ABDOMEN - 1 VIEW COMPARISON:  CT abdomen pelvis September 16, 2016 FINDINGS: Nasogastric tube is identified distal tip probably at the GE junction. Advancement is recommended. Air-filled distended colon is identified. IMPRESSION: Nasogastric tube is identified distal tip probably at the GE junction, advancement is recommended. Air-filled distended colon is identified. Electronically Signed   By: Abelardo Diesel M.D.   On: 09/16/2016 21:33       IMPRESSION/PLAN: 1. Stage II, T3, N0, M0 adenocarcinoma of the rectum. Dr. Lisbeth Renshaw discusses the pathology findings and reviews the nature of rectal cancer and  seeing the patient following surgical resection. The consensus from the GI conference and that adjuvant radiotherapy and concurrent chemotherapy was recommended. We discussed the risks, benefits, short, and long term effects of radiotherapy, and the patient is interested in proceeding. Dr. Lisbeth Renshaw discusses the delivery and logistics of radiotherapy and would recommend a course of 5 1/2-6 weeks of therapy. We will see her back about 2 weeks to allow continued healing to move forward with the simulation and planning process and anticipate starting radiotherapy about 3-4 weeks from now. We will coordinate with Dr. Benay Spice regarding this as well as he may want her to start chemotherapy sooner.  The above documentation reflects my direct findings during this shared patient visit. Please see the separate note by Dr. Lisbeth Renshaw on this date for the remainder of the patient's plan of care.    Carola Rhine, PAC  This document serves as a record of services personally performed by [name], MD. It was created on his behalf by Bethann Humble, a trained medical scribe. The creation of this record is based on the scribe's personal observations and the provider's statements to them. This document has been checked and approved by the attending provider.

## 2016-10-14 NOTE — Progress Notes (Signed)
[place] Work  Holiday representative received referral for transportation concerns.  CSW met with patient and patients brother in [place] office at [facility] to offer support and assess for needs.  CSW and patient reviewed SCAT application.  CSW completed and submitted application to SCAT office.  SCAT representative will contact patient once approved.  CSW provided contact informaiton and encouraged patient to contact CSW with additional needs or concerns.    Wanda Baker, MSW, LCSW, OSW-C Clinical Social Worker [facility] [phone]

## 2016-10-15 ENCOUNTER — Other Ambulatory Visit: Payer: Self-pay | Admitting: Internal Medicine

## 2016-10-15 MED ORDER — GABAPENTIN 300 MG PO CAPS: 300.0000 mg | ORAL_CAPSULE | Freq: Three times a day (TID) | ORAL | 2 refills | Status: DC

## 2016-10-15 NOTE — Telephone Encounter (Signed)
Refilled patient's Gabapentin. Please let her know. Thanks Moriya Mitchell

## 2016-10-15 NOTE — Addendum Note (Signed)
Encounter addended by: Doreen Beam, RN on: 10/15/2016  9:07 AM<BR>    Actions taken: Charge Capture section accepted

## 2016-10-15 NOTE — Telephone Encounter (Signed)
Pt called because she has been told by her other doctor that she can start taking Gabapentin again. The pharmacy faxed [place] and the doctor said that the patient needed a visit. The first available appointment is 10/24/16. She would like to see if she can get enough Gabapentin till last until she is able to be seen by you on the 10th. jw

## 2016-10-16 ENCOUNTER — Other Ambulatory Visit: Payer: Self-pay | Admitting: *Deleted

## 2016-10-16 NOTE — Telephone Encounter (Signed)
Pt informed. Lorianna Spadaccini, CMA  

## 2016-10-16 NOTE — Telephone Encounter (Signed)
Called and stated that gabapentin was supposed to be sent to [facility] on [address]. Please advise. Deseree Kennon Holter, CMA

## 2016-10-16 NOTE — Telephone Encounter (Signed)
LMOVM for pt to call us back. See message below. Rafaella Kole, CMA  

## 2016-10-17 MED ORDER — GABAPENTIN 300 MG PO CAPS: 300.0000 mg | ORAL_CAPSULE | Freq: Three times a day (TID) | ORAL | 2 refills | Status: DC

## 2016-10-22 ENCOUNTER — Other Ambulatory Visit: Payer: Self-pay

## 2016-10-24 ENCOUNTER — Ambulatory Visit (INDEPENDENT_AMBULATORY_CARE_PROVIDER_SITE_OTHER): Payer: Medicaid Other | Admitting: Internal Medicine

## 2016-10-24 ENCOUNTER — Encounter: Payer: Self-pay | Admitting: Internal Medicine

## 2016-10-24 DIAGNOSIS — I10 Essential (primary) hypertension: Secondary | ICD-10-CM

## 2016-10-24 DIAGNOSIS — M4722 Other spondylosis with radiculopathy, cervical region: Secondary | ICD-10-CM

## 2016-10-24 DIAGNOSIS — C189 Malignant neoplasm of colon, unspecified: Secondary | ICD-10-CM

## 2016-10-24 NOTE — Progress Notes (Deleted)
   [facility] Kerrin Mo, MD Phone: [phone]  Reason For Visit:   # *** -   Past Medical History Reviewed problem list.  Medications- reviewed and updated No additions to family history Social history- patient is a *** smoker  Objective: BP 136/74   Pulse 91   Temp 98 F (36.7 C) (Oral)   Ht 5\' 7"  (1.702 m)   Wt 131 lb 6.4 oz (59.6 kg)   SpO2 98%   BMI 20.58 kg/m  Gen: NAD, alert, cooperative with exam HEENT: Normal    Neck: No masses palpated. No lymphadenopathy    Ears: Tympanic membranes intact, normal light reflex, no erythema, no bulging    Eyes: PERRLA, EOMI    Nose: nasal turbinates moist    Throat: moist mucus membranes, no erythema Cardio: regular rate and rhythm, S1S2 heard, no murmurs appreciated Pulm: clear to auscultation bilaterally, no wheezes, rhonchi or rales GI: soft, non-tender, non-distended, bowel sounds present, no hepatomegaly, no splenomegaly GU: external vaginal tissue ***, cervix ***, *** punctate lesions on cervix appreciated, *** discharge from cervical os, *** bleeding, *** cervical motion tenderness, *** abdominal/ adnexal masses Extremities: warm, well perfused, No edema, cyanosis or clubbing;  MSK: Normal gait and station Skin: dry, intact, no rashes or lesions Neuro: Strength and sensation grossly intact   Assessment/Plan: See problem based a/p

## 2016-10-24 NOTE — Patient Instructions (Addendum)
You are looking good. I will continue to follow your progress through your notes. If you develop any issues with constipation please consider mirlax or colace. Please let me know if your blood pressure gets above

## 2016-10-24 NOTE — Progress Notes (Signed)
   [facility] Kerrin Mo, MD Phone: [phone]  Reason For Visit: F/U   # Recently diagnosed with colorectal cancer -Planning to follow-up with radiology for radiation and chemotherapy on later this month. Per oncology radiology want to let wound healed bit better along abdomen before starting radiation.  #CHRONIC HTN: Current Meds - Not currently on any medication since weight loss, doing well  Denies CP, dyspnea, HA, edema, dizziness / lightheadedness  # History of a back pain, and neck pain - No exacerbations currently, doing well with gabapentin as needed   Past Medical History Reviewed problem list.  Medications- reviewed and updated No additions to family history Social history- patient is a non- smoker  Objective: BP 136/74   Pulse 91   Temp 98 F (36.7 C) (Oral)   Ht 5\' 7"  (1.702 m)   Wt 131 lb 6.4 oz (59.6 kg)   SpO2 98%   BMI 20.58 kg/m  Gen: NAD, alert, cooperative with exam Cardio: regular rate and rhythm, S1S2 heard, no murmurs appreciated GI: soft, non-tender, non-distended, black stool noted in colostomy bag, stoma within normal limits, healing right surgical incision, no signs of infection Skin: dry, intact, no rashes or lesions  Assessment/Plan: See problem based a/p  Adenocarcinoma of colon ([place]) Plans for radiation and chemotherapy later this month once surgical incision better healed  No mood screens - no depression or SI  Great support system with family   Cervical radiculopathy due to degenerative joint disease of spine Hx of fusion of C4/C5/C6/C7 Takes gabapentin as needed for back pain, though no issues recently as has been walking daily   HYPERTENSION, BENIGN ESSENTIAL No need to restart medications for now  Could consider if blood pressure trends above 140/90 - patient to let me know if this happens  Follow up in 3 months

## 2016-10-25 NOTE — Assessment & Plan Note (Signed)
No need to restart medications for now  Could consider if blood pressure trends above 140/90 - patient to let me know if this happens  Follow up in 3 months

## 2016-10-25 NOTE — Assessment & Plan Note (Addendum)
Plans for radiation and chemotherapy later this month once surgical incision better healed  No mood screens - no depression or SI  Great support system with family

## 2016-10-25 NOTE — Assessment & Plan Note (Signed)
Hx of fusion of C4/C5/C6/C7 Takes gabapentin as needed for back pain, though no issues recently as has been walking daily

## 2016-11-01 ENCOUNTER — Ambulatory Visit: Payer: Medicaid Other | Admitting: Radiation Oncology

## 2016-11-04 ENCOUNTER — Telehealth: Payer: Self-pay | Admitting: Oncology

## 2016-11-04 ENCOUNTER — Ambulatory Visit (HOSPITAL_BASED_OUTPATIENT_CLINIC_OR_DEPARTMENT_OTHER): Payer: Medicaid Other | Admitting: Nurse Practitioner

## 2016-11-04 ENCOUNTER — Other Ambulatory Visit (HOSPITAL_BASED_OUTPATIENT_CLINIC_OR_DEPARTMENT_OTHER): Payer: Medicaid Other

## 2016-11-04 VITALS — BP 140/77 | HR 84 | Temp 98.0°F | Resp 18 | Ht 67.0 in | Wt 131.1 lb

## 2016-11-04 DIAGNOSIS — C20 Malignant neoplasm of rectum: Secondary | ICD-10-CM

## 2016-11-04 LAB — CBC WITH DIFFERENTIAL/PLATELET
BASO%: 0.2 % (ref 0.0–2.0)
BASOS ABS: 0 10*3/uL (ref 0.0–0.1)
EOS%: 1.5 % (ref 0.0–7.0)
Eosinophils Absolute: 0.1 10*3/uL (ref 0.0–0.5)
HEMATOCRIT: 37.4 % (ref 34.8–46.6)
HGB: 11.7 g/dL (ref 11.6–15.9)
LYMPH#: 3.2 10*3/uL (ref 0.9–3.3)
LYMPH%: 39.4 % (ref 14.0–49.7)
MCH: 25.7 pg (ref 25.1–34.0)
MCHC: 31.3 g/dL — AB (ref 31.5–36.0)
MCV: 82.2 fL (ref 79.5–101.0)
MONO#: 0.5 10*3/uL (ref 0.1–0.9)
MONO%: 6.2 % (ref 0.0–14.0)
NEUT#: 4.3 10*3/uL (ref 1.5–6.5)
NEUT%: 52.7 % (ref 38.4–76.8)
Platelets: 254 10*3/uL (ref 145–400)
RBC: 4.55 10*6/uL (ref 3.70–5.45)
RDW: 17.7 % — ABNORMAL HIGH (ref 11.2–14.5)
WBC: 8.2 10*3/uL (ref 3.9–10.3)

## 2016-11-04 NOTE — Telephone Encounter (Signed)
Gave patient avs report and appointments for June.  °

## 2016-11-04 NOTE — Progress Notes (Signed)
  [place] OFFICE PROGRESS NOTE   Diagnosis:  Rectal cancer  INTERVAL HISTORY:   Wanda Baker returns as scheduled. She feels well. She denies nausea/vomiting. No diarrhea. She denies bleeding. She reports a good appetite. She is walking every day. She denies abdominal pain.  Objective:  Vital signs in last 24 hours:  Blood pressure 140/77, pulse 84, temperature 98 F (36.7 C), temperature source Oral, resp. rate 18, height '5\' 7"'$  (1.702 m), weight 131 lb 1.6 oz (59.5 kg), SpO2 99 %.    HEENT: No thrush or ulcers. Resp: Lungs clear bilaterally. Cardio: Regular rate and rhythm. GI: No hepatomegaly. Left lower quadrant colostomy. Midline wound with small area of superficial opening toward the upper portion of the wound. No surrounding erythema. Vascular: No leg edema.   Lab Results:  Lab Results  Component Value Date   WBC 8.2 11/04/2016   HGB 11.7 11/04/2016   HCT 37.4 11/04/2016   MCV 82.2 11/04/2016   PLT 254 11/04/2016   NEUTROABS 4.3 11/04/2016    Imaging:  No results found.  Medications: I have reviewed the patient's current medications.  Assessment/Plan: 1. 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 Tumor noted at 15 cm on sigmoidoscopy 09/17/2016 ? CT abdomen/pelvis 09/16/2016-colon obstruction, mass at the "distal sigmoid colon " ? Elevated preoperative CEA ? MSI-stable, no loss of mismatch repair protein expression ? 0/22 lymph nodes positive, extracellular mucin without tumor on the inked serosal surface  2. Colonic obstruction secondary to #1  3. Iron deficiency anemia secondary to #1. Improved. She continues oral iron.   Disposition:Wanda Baker appears stable. We anticipate she will begin the course of radiation 11/12/2016. She understands to begin Xeloda the same day. We again reviewed potential toxicities  associated with Xeloda.  We reviewed today's labs. The hemoglobin has corrected into low normal range. She will continue oral iron.  She will return for a follow-up visit in approximately 3 weeks. She will contact the office in the interim with any problems.  Plan reviewed with Dr. Benay Spice.    Ned Card ANP/GNP-BC   11/04/2016  12:24 PM

## 2016-11-05 ENCOUNTER — Ambulatory Visit
Admission: RE | Admit: 2016-11-05 | Discharge: 2016-11-05 | Disposition: A | Discharge status: Home / Self Care | Payer: Medicaid Other | Source: Ambulatory Visit | Attending: Radiation Oncology | Admitting: Radiation Oncology

## 2016-11-05 DIAGNOSIS — Z933 Colostomy status: Secondary | ICD-10-CM | POA: Diagnosis not present

## 2016-11-05 DIAGNOSIS — C20 Malignant neoplasm of rectum: Secondary | ICD-10-CM

## 2016-11-05 DIAGNOSIS — Z510 Encounter for antineoplastic radiation therapy: Secondary | ICD-10-CM | POA: Diagnosis not present

## 2016-11-05 MED FILL — XELODA 500 MG TABLET: 500 | 50 days supply | Qty: 180 | Fill #0

## 2016-11-05 NOTE — Progress Notes (Signed)
  Radiation Oncology         [phone] ________________________________  Name: JIMYA CIANI MRN: [id number]  Date: 11/05/2016  DOB: [birth year]   SIMULATION AND TREATMENT PLANNING NOTE  DIAGNOSIS:     ICD-9-CM ICD-10-CM   1. Rectal cancer ([place]) 154.1 C20      The patient presented for simulation for the patient's upcoming course of radiation for the diagnosis of rectal cancer. The patient was placed in a supine position. A customized vac-lock bag was constructed to aid in patient immobilization on. This complex treatment device will be used on a daily basis during the treatment. In this fashion a CT scan was obtained through the pelvic region and the isocenter was placed near midline within the pelvis. Surface markings were placed.  The patient's imaging was loaded into the radiation treatment planning system. The patient will initially be planned to receive a course of radiation to a dose of 45 Gy. This will be accomplished in 25 fractions at 1.8 gray per fraction. This initial treatment will correspond to a 3-D conformal technique. The target has been contoured in addition to the rectum, bladder and femoral heads. Dose volume histograms of each of these structures have been requested and these will be carefully reviewed as part of the 3-D conformal treatment planning process. To accomplish this initial treatment, 4 customized blocks have been designed for this purpose. Each of these 4 complex treatment devices will be used on a daily basis during the initial course of the treatment. It is anticipated that the patient will then receive a boost for an additional 5.4 Gy. The anticipated total dose therefore will be 50.4 Gy.    Special treatment procedure The patient will receive chemotherapy during the course of radiation treatment. The patient may experience increased or overlapping toxicity due to this combined-modality approach and the patient will be monitored for such problems. This may  include extra lab work as necessary. This therefore constitutes a special treatment procedure.    ________________________________  Jodelle Gross, MD, PhD

## 2016-11-05 NOTE — Progress Notes (Signed)
  Radiation Oncology         [phone] ________________________________  Name: DAANYA LANPHIER MRN: [id number]  Date: 11/05/2016  DOB: Jul 14, 1960  Optical Surface Tracking Plan:  Since intensity modulated radiotherapy (IMRT) and 3D conformal radiation treatment methods are predicated on accurate and precise positioning for treatment, intrafraction motion monitoring is medically necessary to ensure accurate and safe treatment delivery.  The ability to quantify intrafraction motion without excessive ionizing radiation dose can only be performed with optical surface tracking. Accordingly, surface imaging offers the opportunity to obtain 3D measurements of patient position throughout IMRT and 3D treatments without excessive radiation exposure.  I am ordering optical surface tracking for this patient's upcoming course of radiotherapy. ________________________________  [name], MD 11/05/2016 6:06 PM    Reference:   Particia Jasper, et al. Surface imaging-based analysis of intrafraction motion for breast radiotherapy patients.Journal of [place], n. 6, nov. 2014. ISSN [id number].   Available at: <http://www.jacmp.org/index.php/jacmp/article/view/4957>.

## 2016-11-13 ENCOUNTER — Telehealth: Payer: Self-pay | Admitting: Internal Medicine

## 2016-11-13 DIAGNOSIS — C20 Malignant neoplasm of rectum: Secondary | ICD-10-CM | POA: Diagnosis not present

## 2016-11-13 DIAGNOSIS — Z933 Colostomy status: Secondary | ICD-10-CM | POA: Diagnosis not present

## 2016-11-13 DIAGNOSIS — Z510 Encounter for antineoplastic radiation therapy: Secondary | ICD-10-CM | POA: Diagnosis not present

## 2016-11-13 NOTE — Telephone Encounter (Signed)
Pt had family members that had a stomach virus and pt was told to call PCP if she had any symptoms. Pt has diarrhea now, pt wants PCP to call her today. ep

## 2016-11-14 NOTE — Telephone Encounter (Signed)
Patient has improved, no longer having diarrhea. Has kept well hydrated, going to start chemo and radiation next week.

## 2016-11-15 NOTE — Addendum Note (Signed)
Addendum  created 11/15/16 1321 by Asia Dusenbury, MD   Sign clinical note    

## 2016-11-20 ENCOUNTER — Ambulatory Visit
Admission: RE | Admit: 2016-11-20 | Discharge: 2016-11-20 | Disposition: A | Discharge status: Home / Self Care | Payer: Medicaid Other | Source: Ambulatory Visit | Attending: Radiation Oncology | Admitting: Radiation Oncology

## 2016-11-20 DIAGNOSIS — Z933 Colostomy status: Secondary | ICD-10-CM | POA: Diagnosis not present

## 2016-11-20 DIAGNOSIS — Z510 Encounter for antineoplastic radiation therapy: Secondary | ICD-10-CM | POA: Diagnosis not present

## 2016-11-20 DIAGNOSIS — C20 Malignant neoplasm of rectum: Secondary | ICD-10-CM | POA: Diagnosis not present

## 2016-11-20 NOTE — Progress Notes (Signed)
Pt education done, my business card, radiation therapy and you book, sitz bath given, discussed rectum side effects: diarrhea, nausea,vomiting, skin changes,irritation, pain, loss appetite, bladder changes, fatigue, may need for IVF"S, buy imodium for diarrhea, low fiber diet with diarrhea, may need to eat smaller meals 5-6 instead of 3 meals, and snacks, ensure/boost in between meals, stay hydrated, drink plenty water, gator ade,etc, increase calories, use baby wipes, tucks, sees MD weekly and prn, , teach back given 5:59 PM

## 2016-11-21 ENCOUNTER — Ambulatory Visit
Admission: RE | Admit: 2016-11-21 | Discharge: 2016-11-21 | Disposition: A | Discharge status: Home / Self Care | Payer: Medicaid Other | Source: Ambulatory Visit | Attending: Radiation Oncology | Admitting: Radiation Oncology

## 2016-11-21 DIAGNOSIS — C20 Malignant neoplasm of rectum: Secondary | ICD-10-CM | POA: Diagnosis not present

## 2016-11-21 DIAGNOSIS — Z510 Encounter for antineoplastic radiation therapy: Secondary | ICD-10-CM | POA: Diagnosis not present

## 2016-11-21 DIAGNOSIS — Z933 Colostomy status: Secondary | ICD-10-CM | POA: Diagnosis not present

## 2016-11-22 ENCOUNTER — Ambulatory Visit
Admission: RE | Admit: 2016-11-22 | Discharge: 2016-11-22 | Disposition: A | Discharge status: Home / Self Care | Payer: Medicaid Other | Source: Ambulatory Visit | Attending: Radiation Oncology | Admitting: Radiation Oncology

## 2016-11-22 DIAGNOSIS — Z510 Encounter for antineoplastic radiation therapy: Secondary | ICD-10-CM | POA: Diagnosis not present

## 2016-11-22 DIAGNOSIS — Z933 Colostomy status: Secondary | ICD-10-CM | POA: Diagnosis not present

## 2016-11-22 DIAGNOSIS — C20 Malignant neoplasm of rectum: Secondary | ICD-10-CM | POA: Diagnosis not present

## 2016-11-25 ENCOUNTER — Other Ambulatory Visit (HOSPITAL_BASED_OUTPATIENT_CLINIC_OR_DEPARTMENT_OTHER): Payer: Medicaid Other

## 2016-11-25 ENCOUNTER — Ambulatory Visit (HOSPITAL_BASED_OUTPATIENT_CLINIC_OR_DEPARTMENT_OTHER): Payer: Medicaid Other | Admitting: Oncology

## 2016-11-25 ENCOUNTER — Telehealth: Payer: Self-pay | Admitting: Oncology

## 2016-11-25 ENCOUNTER — Ambulatory Visit
Admission: RE | Admit: 2016-11-25 | Discharge: 2016-11-25 | Disposition: A | Discharge status: Home / Self Care | Payer: Medicaid Other | Source: Ambulatory Visit | Attending: Radiation Oncology | Admitting: Radiation Oncology

## 2016-11-25 VITALS — BP 144/70 | HR 76 | Temp 97.9°F | Resp 20 | Ht 67.0 in | Wt 132.7 lb

## 2016-11-25 DIAGNOSIS — Z510 Encounter for antineoplastic radiation therapy: Secondary | ICD-10-CM | POA: Diagnosis not present

## 2016-11-25 DIAGNOSIS — C20 Malignant neoplasm of rectum: Secondary | ICD-10-CM

## 2016-11-25 DIAGNOSIS — D630 Anemia in neoplastic disease: Secondary | ICD-10-CM | POA: Diagnosis not present

## 2016-11-25 LAB — CBC WITH DIFFERENTIAL/PLATELET
BASO%: 0.3 % (ref 0.0–2.0)
BASOS ABS: 0 10*3/uL (ref 0.0–0.1)
EOS ABS: 0.1 10*3/uL (ref 0.0–0.5)
EOS%: 0.7 % (ref 0.0–7.0)
HCT: 40.5 % (ref 34.8–46.6)
HGB: 13.3 g/dL (ref 11.6–15.9)
LYMPH%: 35.3 % (ref 14.0–49.7)
MCH: 26.1 pg (ref 25.1–34.0)
MCHC: 32.8 g/dL (ref 31.5–36.0)
MCV: 79.5 fL (ref 79.5–101.0)
MONO#: 0.6 10*3/uL (ref 0.1–0.9)
MONO%: 7.2 % (ref 0.0–14.0)
NEUT%: 56.5 % (ref 38.4–76.8)
NEUTROS ABS: 4.8 10*3/uL (ref 1.5–6.5)
PLATELETS: 307 10*3/uL (ref 145–400)
RBC: 5.1 10*6/uL (ref 3.70–5.45)
RDW: 17.5 % — ABNORMAL HIGH (ref 11.2–14.5)
WBC: 8.5 10*3/uL (ref 3.9–10.3)
lymph#: 3 10*3/uL (ref 0.9–3.3)

## 2016-11-25 NOTE — Progress Notes (Signed)
  [facility] OFFICE PROGRESS NOTE   Diagnosis: Rectal cancer  INTERVAL HISTORY:   Ms. Tumblin returns as scheduled. She began radiation and concurrent Xeloda on 11/21/2016. The abdominal wound has almost healed. No mouth sores or diarrhea.  Objective:  Vital signs in last 24 hours:  Blood pressure (!) 144/70, pulse 76, temperature 97.9 F (36.6 C), temperature source Oral, resp. rate 20, height 5' 7" (1.702 m), weight 132 lb 11.2 oz (60.2 kg), SpO2 100 %.    HEENT: No thrush or ulcers Resp: Lungs clear bilaterally Cardio: Regular rate and rhythm GI: No hepatosplenomegaly, the midline incision has healed except for a 3-4 mm area of superficial opening at the upper aspect of the wound. No evidence for infection. Left lower quadrant colostomy with dark stool. Vascular: No leg edema  Skin: Palms without erythema   Lab Results:  Lab Results  Component Value Date   WBC 8.5 11/25/2016   HGB 13.3 11/25/2016   HCT 40.5 11/25/2016   MCV 79.5 11/25/2016   PLT 307 11/25/2016   NEUTROABS 4.8 11/25/2016    CMP     Component Value Date/Time   NA 142 10/14/2016 1129   K 3.9 10/14/2016 1129   CL 102 09/25/2016 0611   CO2 26 10/14/2016 1129   GLUCOSE 125 10/14/2016 1129   BUN 9.1 10/14/2016 1129   CREATININE 0.6 10/14/2016 1129   CALCIUM 10.2 10/14/2016 1129   PROT 7.5 10/14/2016 1129   ALBUMIN 3.6 10/14/2016 1129   AST 12 10/14/2016 1129   ALT 11 10/14/2016 1129   ALKPHOS 81 10/14/2016 1129   BILITOT 0.32 10/14/2016 1129   GFRNONAA >60 09/25/2016 0611   GFRNONAA >89 09/21/2015 1502   GFRAA >60 09/25/2016 0611   GFRAA >89 09/21/2015 1502     Medications: I have reviewed the patient's current medications.  Assessment/Plan: 1. 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 Tumor noted at 15 cm on sigmoidoscopy 09/17/2016 ? CT abdomen/pelvis  09/16/2016-colon obstruction, mass at the "distal sigmoid colon " ? Elevated preoperative CEA ? MSI-stable, no loss of mismatch repair protein expression ? 0/22 lymph nodes positive, extracellular mucin without tumor on the inked serosal surface ? Initiation of Xeloda/radiation 11/21/2016  2. Colonic obstruction secondary to #1  3. Iron deficiency anemia secondary to #1. Improved.    Disposition:  Ms. Raimondi appears well. The abdominal wound has almost completely healed. She will continue Xeloda/radiation. Ms. Daffron will return for an office visit 12/10/2016. She will decrease the ferrous sulfate to once daily.  15 minutes were spent with the patient today. The majority of the time was used for counseling and coordination of care.  Gary Sherill, MD  11/25/2016  10:42 AM   

## 2016-11-25 NOTE — Telephone Encounter (Signed)
Gave patient avs report and appointments for June.  °

## 2016-11-26 ENCOUNTER — Ambulatory Visit
Admission: RE | Admit: 2016-11-26 | Discharge: 2016-11-26 | Disposition: A | Discharge status: Home / Self Care | Payer: Medicaid Other | Source: Ambulatory Visit | Attending: Radiation Oncology | Admitting: Radiation Oncology

## 2016-11-26 DIAGNOSIS — C20 Malignant neoplasm of rectum: Secondary | ICD-10-CM | POA: Diagnosis not present

## 2016-11-26 DIAGNOSIS — Z933 Colostomy status: Secondary | ICD-10-CM | POA: Diagnosis not present

## 2016-11-26 DIAGNOSIS — Z510 Encounter for antineoplastic radiation therapy: Secondary | ICD-10-CM | POA: Diagnosis not present

## 2016-11-27 ENCOUNTER — Ambulatory Visit
Admission: RE | Admit: 2016-11-27 | Discharge: 2016-11-27 | Disposition: A | Discharge status: Home / Self Care | Payer: Medicaid Other | Source: Ambulatory Visit | Attending: Radiation Oncology | Admitting: Radiation Oncology

## 2016-11-27 DIAGNOSIS — C20 Malignant neoplasm of rectum: Secondary | ICD-10-CM | POA: Diagnosis not present

## 2016-11-27 DIAGNOSIS — Z933 Colostomy status: Secondary | ICD-10-CM | POA: Diagnosis not present

## 2016-11-27 DIAGNOSIS — Z510 Encounter for antineoplastic radiation therapy: Secondary | ICD-10-CM | POA: Diagnosis not present

## 2016-11-28 ENCOUNTER — Ambulatory Visit
Admission: RE | Admit: 2016-11-28 | Discharge: 2016-11-28 | Disposition: A | Discharge status: Home / Self Care | Payer: Medicaid Other | Source: Ambulatory Visit | Attending: Radiation Oncology | Admitting: Radiation Oncology

## 2016-11-28 DIAGNOSIS — C20 Malignant neoplasm of rectum: Secondary | ICD-10-CM

## 2016-11-28 DIAGNOSIS — Z933 Colostomy status: Secondary | ICD-10-CM | POA: Diagnosis not present

## 2016-11-28 DIAGNOSIS — Z510 Encounter for antineoplastic radiation therapy: Secondary | ICD-10-CM | POA: Diagnosis not present

## 2016-11-28 NOTE — Progress Notes (Signed)
   Department of Radiation Oncology  Phone:  [phone] Fax:        [phone]  Weekly Treatment Note    Name: Wanda Baker Date: 11/28/2016 MRN: [id number] DOB: [birth year]   Diagnosis:     ICD-10-CM   1. Rectal cancer ([facility]) C20      Current dose: 10.8 Gy  Current fraction: 6   MEDICATIONS: Current Outpatient Prescriptions  Medication Sig Dispense Refill  . capecitabine (XELODA) 500 MG tablet Take 3 tablets (1,500 mg total) by mouth 2 (two) times daily after a meal. On days of radiation only. (Mon-Fri) (Patient not taking: Reported on 11/04/2016) 180 tablet 0  . cetirizine (ZYRTEC) 10 MG tablet Take 10 mg by mouth daily.    . ferrous sulfate 325 (65 FE) MG tablet Take 1 tablet (325 mg total) by mouth 3 (three) times daily with meals. 90 tablet 0  . gabapentin (NEURONTIN) 300 MG capsule Take 1 capsule (300 mg total) by mouth 3 (three) times daily. 90 capsule 2  . montelukast (SINGULAIR) 10 MG tablet TAKE 1 TABLET BY MOUTH AT BEDTIME (Patient not taking: Reported on 09/16/2016) 30 tablet 11  . omeprazole (PRILOSEC) 20 MG capsule TAKE ONE CAPSULE BY MOUTH EVERY DAY (Patient not taking: Reported on 09/16/2016) 30 capsule 5  . prochlorperazine (COMPAZINE) 10 MG tablet Take 1 tablet (10 mg total) by mouth every 6 (six) hours as needed for nausea or vomiting. (Patient not taking: Reported on 10/14/2016) 30 tablet 0  . saccharomyces boulardii (FLORASTOR) 250 MG capsule Take 1 capsule (250 mg total) by mouth 2 (two) times daily. 60 capsule 0  . traMADol (ULTRAM) 50 MG tablet Take 2 tablets (100 mg total) by mouth every 6 (six) hours as needed for moderate pain. (Patient not taking: Reported on 10/14/2016) 30 tablet 0  . triamcinolone (NASACORT AQ) 55 MCG/ACT nasal inhaler Place 2 sprays into the nose daily. (Patient not taking: Reported on 09/16/2016) 1 Inhaler 12   No current facility-administered medications for this encounter.      ALLERGIES: Prednisone; Vicodin  [hydrocodone-acetaminophen]; and Penicillins   LABORATORY DATA:  Lab Results  Component Value Date   WBC 8.5 11/25/2016   HGB 13.3 11/25/2016   HCT 40.5 11/25/2016   MCV 79.5 11/25/2016   PLT 307 11/25/2016   Lab Results  Component Value Date   NA 142 10/14/2016   K 3.9 10/14/2016   CL 102 09/25/2016   CO2 26 10/14/2016   Lab Results  Component Value Date   ALT 11 10/14/2016   AST 12 10/14/2016   ALKPHOS 81 10/14/2016   BILITOT 0.32 10/14/2016     NARRATIVE: Wanda Baker was seen today for weekly treatment management. The chart was checked and the patient's films were reviewed. . Patient had no new complaints, no rectal bleeding or diarrhea    PHYSICAL EXAMINATION: vitals were not taken for this visit.  Alert & Oriented x4. In no distress.  ASSESSMENT: The patient is doing satisfactorily with treatment.  PLAN: We will continue with the patient's radiation treatment as planned.     This document serves as a record of services personally performed by [name], MD. It was created on his behalf by Valeta Harms, a trained medical scribe. The creation of this record is based on the scribe's personal observations and the provider's statements to them. This document has been checked and approved by the attending provider.

## 2016-11-29 ENCOUNTER — Ambulatory Visit
Admission: RE | Admit: 2016-11-29 | Discharge: 2016-11-29 | Disposition: A | Discharge status: Home / Self Care | Payer: Medicaid Other | Source: Ambulatory Visit | Attending: Radiation Oncology | Admitting: Radiation Oncology

## 2016-11-29 DIAGNOSIS — Z510 Encounter for antineoplastic radiation therapy: Secondary | ICD-10-CM | POA: Diagnosis not present

## 2016-11-29 DIAGNOSIS — C20 Malignant neoplasm of rectum: Secondary | ICD-10-CM | POA: Diagnosis not present

## 2016-11-29 DIAGNOSIS — Z933 Colostomy status: Secondary | ICD-10-CM | POA: Diagnosis not present

## 2016-12-02 ENCOUNTER — Ambulatory Visit
Admission: RE | Admit: 2016-12-02 | Discharge: 2016-12-02 | Disposition: A | Discharge status: Home / Self Care | Payer: Medicaid Other | Source: Ambulatory Visit | Attending: Radiation Oncology | Admitting: Radiation Oncology

## 2016-12-02 DIAGNOSIS — Z510 Encounter for antineoplastic radiation therapy: Secondary | ICD-10-CM | POA: Diagnosis not present

## 2016-12-03 ENCOUNTER — Ambulatory Visit
Admission: RE | Admit: 2016-12-03 | Discharge: 2016-12-03 | Disposition: A | Discharge status: Home / Self Care | Payer: Medicaid Other | Source: Ambulatory Visit | Attending: Radiation Oncology | Admitting: Radiation Oncology

## 2016-12-03 DIAGNOSIS — Z510 Encounter for antineoplastic radiation therapy: Secondary | ICD-10-CM | POA: Diagnosis not present

## 2016-12-04 ENCOUNTER — Ambulatory Visit
Admission: RE | Admit: 2016-12-04 | Discharge: 2016-12-04 | Disposition: A | Discharge status: Home / Self Care | Payer: Medicaid Other | Source: Ambulatory Visit | Attending: Radiation Oncology | Admitting: Radiation Oncology

## 2016-12-04 DIAGNOSIS — Z510 Encounter for antineoplastic radiation therapy: Secondary | ICD-10-CM | POA: Diagnosis not present

## 2016-12-04 DIAGNOSIS — Z933 Colostomy status: Secondary | ICD-10-CM | POA: Diagnosis not present

## 2016-12-04 DIAGNOSIS — C20 Malignant neoplasm of rectum: Secondary | ICD-10-CM | POA: Diagnosis not present

## 2016-12-05 ENCOUNTER — Ambulatory Visit
Admission: RE | Admit: 2016-12-05 | Discharge: 2016-12-05 | Disposition: A | Discharge status: Home / Self Care | Payer: Medicaid Other | Source: Ambulatory Visit | Attending: Radiation Oncology | Admitting: Radiation Oncology

## 2016-12-05 DIAGNOSIS — Z510 Encounter for antineoplastic radiation therapy: Secondary | ICD-10-CM | POA: Diagnosis not present

## 2016-12-05 DIAGNOSIS — Z933 Colostomy status: Secondary | ICD-10-CM | POA: Diagnosis not present

## 2016-12-05 DIAGNOSIS — C20 Malignant neoplasm of rectum: Secondary | ICD-10-CM | POA: Diagnosis not present

## 2016-12-06 ENCOUNTER — Ambulatory Visit
Admission: RE | Admit: 2016-12-06 | Discharge: 2016-12-06 | Disposition: A | Discharge status: Home / Self Care | Payer: Medicaid Other | Source: Ambulatory Visit | Attending: Radiation Oncology | Admitting: Radiation Oncology

## 2016-12-06 DIAGNOSIS — Z933 Colostomy status: Secondary | ICD-10-CM | POA: Diagnosis not present

## 2016-12-06 DIAGNOSIS — Z510 Encounter for antineoplastic radiation therapy: Secondary | ICD-10-CM | POA: Diagnosis not present

## 2016-12-06 DIAGNOSIS — C20 Malignant neoplasm of rectum: Secondary | ICD-10-CM | POA: Diagnosis not present

## 2016-12-09 ENCOUNTER — Telehealth: Payer: Self-pay

## 2016-12-09 ENCOUNTER — Ambulatory Visit
Admission: RE | Admit: 2016-12-09 | Discharge: 2016-12-09 | Disposition: A | Discharge status: Home / Self Care | Payer: Medicaid Other | Source: Ambulatory Visit | Attending: Radiation Oncology | Admitting: Radiation Oncology

## 2016-12-09 DIAGNOSIS — Z510 Encounter for antineoplastic radiation therapy: Secondary | ICD-10-CM | POA: Diagnosis not present

## 2016-12-09 NOTE — Telephone Encounter (Signed)
Pt called asking if she should take her medication. Called back and no answer.

## 2016-12-09 NOTE — Telephone Encounter (Signed)
"  Let Dr.Sherrill know I started having diarrhea Saturday, five to six stools per day.  Mon-Fri., I take Xeloda with radiation.  Do I need to take t he pill and keep my radiation appointment today?  No diarrhea yet today.  Tomorrow I see Dr. Benay Spice."  Will notify provider for any orders.  Asked to send orders to [facility] since she will be here.  Nurse instructions to take Xeloda after meal or food at best time early today and repeat in the evening hours.  Keep radiation appointment.  Obtain Imodium tablets to take two with first diarrhea stool and one every two hours, 8 pills maximum daily.

## 2016-12-10 ENCOUNTER — Telehealth: Payer: Self-pay | Admitting: Oncology

## 2016-12-10 ENCOUNTER — Ambulatory Visit
Admission: RE | Admit: 2016-12-10 | Discharge: 2016-12-10 | Disposition: A | Discharge status: Home / Self Care | Payer: Medicaid Other | Source: Ambulatory Visit | Attending: Radiation Oncology | Admitting: Radiation Oncology

## 2016-12-10 ENCOUNTER — Other Ambulatory Visit (HOSPITAL_BASED_OUTPATIENT_CLINIC_OR_DEPARTMENT_OTHER): Payer: Medicaid Other

## 2016-12-10 ENCOUNTER — Ambulatory Visit (HOSPITAL_BASED_OUTPATIENT_CLINIC_OR_DEPARTMENT_OTHER): Payer: Medicaid Other | Admitting: Nurse Practitioner

## 2016-12-10 VITALS — BP 125/73 | HR 82 | Temp 96.6°F | Resp 18 | Ht 67.0 in | Wt 130.6 lb

## 2016-12-10 DIAGNOSIS — C20 Malignant neoplasm of rectum: Secondary | ICD-10-CM | POA: Diagnosis not present

## 2016-12-10 DIAGNOSIS — D630 Anemia in neoplastic disease: Secondary | ICD-10-CM

## 2016-12-10 DIAGNOSIS — Z510 Encounter for antineoplastic radiation therapy: Secondary | ICD-10-CM | POA: Diagnosis not present

## 2016-12-10 DIAGNOSIS — Z933 Colostomy status: Secondary | ICD-10-CM | POA: Diagnosis not present

## 2016-12-10 LAB — CBC WITH DIFFERENTIAL/PLATELET
BASO%: 0.3 % (ref 0.0–2.0)
BASOS ABS: 0 10*3/uL (ref 0.0–0.1)
EOS%: 7.5 % — ABNORMAL HIGH (ref 0.0–7.0)
Eosinophils Absolute: 0.5 10*3/uL (ref 0.0–0.5)
HEMATOCRIT: 40 % (ref 34.8–46.6)
HEMOGLOBIN: 13.3 g/dL (ref 11.6–15.9)
LYMPH#: 1.2 10*3/uL (ref 0.9–3.3)
LYMPH%: 20.2 % (ref 14.0–49.7)
MCH: 27.3 pg (ref 25.1–34.0)
MCHC: 33.3 g/dL (ref 31.5–36.0)
MCV: 82 fL (ref 79.5–101.0)
MONO#: 0.6 10*3/uL (ref 0.1–0.9)
MONO%: 9.2 % (ref 0.0–14.0)
NEUT#: 3.8 10*3/uL (ref 1.5–6.5)
NEUT%: 62.8 % (ref 38.4–76.8)
PLATELETS: 176 10*3/uL (ref 145–400)
RBC: 4.88 10*6/uL (ref 3.70–5.45)
RDW: 16.4 % — AB (ref 11.2–14.5)
WBC: 6 10*3/uL (ref 3.9–10.3)

## 2016-12-10 NOTE — Progress Notes (Signed)
  [facility] OFFICE PROGRESS NOTE   Diagnosis:  Rectal cancer  INTERVAL HISTORY:   Ms. Fiorentino returns as scheduled. She continues radiation and Xeloda. She had multiple loose stools 2 days ago. She began Imodium yesterday. She had one bowel movement yesterday and none so far today. She denies nausea/vomiting. No mouth sores. No hand or foot pain or redness.  Objective:  Vital signs in last 24 hours:  Blood pressure 125/73, pulse 82, temperature (!) 96.6 F (35.9 C), temperature source Oral, resp. rate 18, height '5\' 7"'$  (1.702 m), weight 130 lb 9.6 oz (59.2 kg), SpO2 97 %.    HEENT: No thrush or ulcers. Resp: Lungs clear bilaterally. Cardio: Regular rate and rhythm. GI: Abdomen soft and nontender. No hepatomegaly. Healed midline surgical incision. Vascular: No leg edema. Skin: Palms without erythema.    Lab Results:  Lab Results  Component Value Date   WBC 8.5 11/25/2016   HGB 13.3 11/25/2016   HCT 40.5 11/25/2016   MCV 79.5 11/25/2016   PLT 307 11/25/2016   NEUTROABS 4.8 11/25/2016    Imaging:  No results found.  Medications: I have reviewed the patient's current medications.  Assessment/Plan: 1. 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 Tumor noted at 15 cm on sigmoidoscopy 09/17/2016 ? CT abdomen/pelvis 09/16/2016-colon obstruction, mass at the "distal sigmoid colon " ? Elevated preoperative CEA ? MSI-stable, no loss of mismatch repair protein expression ? 0/22 lymph nodes positive, extracellular mucin without tumor on the inked serosal surface ? Initiation of Xeloda/radiation 11/21/2016  2. Colonic obstruction secondary to #1  3. Iron deficiency anemia secondary to #1. Improved.    Disposition: Ms. Yarborough appears stable. She continues radiation and Xeloda. She had diarrhea a few days ago. The diarrhea responded to  Imodium. She understands to contact the office if the diarrhea is not controlled with Imodium. She will return for a follow-up visit in 2 weeks.    Ned Card ANP/GNP-BC   12/10/2016  10:30 AM

## 2016-12-10 NOTE — Telephone Encounter (Signed)
Appointments scheduled per 12/10/16 los. °Patient was given a copy of the AVS report and appointment schedule per 12/10/16 los. °

## 2016-12-11 ENCOUNTER — Ambulatory Visit
Admission: RE | Admit: 2016-12-11 | Discharge: 2016-12-11 | Disposition: A | Discharge status: Home / Self Care | Payer: Medicaid Other | Source: Ambulatory Visit | Attending: Radiation Oncology | Admitting: Radiation Oncology

## 2016-12-11 DIAGNOSIS — Z933 Colostomy status: Secondary | ICD-10-CM | POA: Diagnosis not present

## 2016-12-11 DIAGNOSIS — C20 Malignant neoplasm of rectum: Secondary | ICD-10-CM | POA: Diagnosis not present

## 2016-12-11 DIAGNOSIS — Z510 Encounter for antineoplastic radiation therapy: Secondary | ICD-10-CM | POA: Diagnosis not present

## 2016-12-12 ENCOUNTER — Ambulatory Visit
Admission: RE | Admit: 2016-12-12 | Discharge: 2016-12-12 | Disposition: A | Discharge status: Home / Self Care | Payer: Medicaid Other | Source: Ambulatory Visit | Attending: Radiation Oncology | Admitting: Radiation Oncology

## 2016-12-12 DIAGNOSIS — C20 Malignant neoplasm of rectum: Secondary | ICD-10-CM | POA: Diagnosis not present

## 2016-12-12 DIAGNOSIS — Z510 Encounter for antineoplastic radiation therapy: Secondary | ICD-10-CM | POA: Diagnosis not present

## 2016-12-12 DIAGNOSIS — Z933 Colostomy status: Secondary | ICD-10-CM | POA: Diagnosis not present

## 2016-12-13 ENCOUNTER — Ambulatory Visit
Admission: RE | Admit: 2016-12-13 | Discharge: 2016-12-13 | Disposition: A | Discharge status: Home / Self Care | Payer: Medicaid Other | Source: Ambulatory Visit | Attending: Radiation Oncology | Admitting: Radiation Oncology

## 2016-12-13 DIAGNOSIS — Z933 Colostomy status: Secondary | ICD-10-CM | POA: Diagnosis not present

## 2016-12-13 DIAGNOSIS — Z510 Encounter for antineoplastic radiation therapy: Secondary | ICD-10-CM | POA: Diagnosis not present

## 2016-12-13 DIAGNOSIS — C20 Malignant neoplasm of rectum: Secondary | ICD-10-CM | POA: Diagnosis not present

## 2016-12-16 ENCOUNTER — Ambulatory Visit
Admission: RE | Admit: 2016-12-16 | Discharge: 2016-12-16 | Disposition: A | Discharge status: Home / Self Care | Payer: Medicaid Other | Source: Ambulatory Visit | Attending: Radiation Oncology | Admitting: Radiation Oncology

## 2016-12-16 DIAGNOSIS — Z510 Encounter for antineoplastic radiation therapy: Secondary | ICD-10-CM | POA: Diagnosis not present

## 2016-12-17 ENCOUNTER — Ambulatory Visit
Admission: RE | Admit: 2016-12-17 | Discharge: 2016-12-17 | Disposition: A | Discharge status: Home / Self Care | Payer: Medicaid Other | Source: Ambulatory Visit | Attending: Radiation Oncology | Admitting: Radiation Oncology

## 2016-12-17 DIAGNOSIS — C20 Malignant neoplasm of rectum: Secondary | ICD-10-CM | POA: Diagnosis not present

## 2016-12-17 DIAGNOSIS — Z933 Colostomy status: Secondary | ICD-10-CM | POA: Diagnosis not present

## 2016-12-17 DIAGNOSIS — Z510 Encounter for antineoplastic radiation therapy: Secondary | ICD-10-CM | POA: Diagnosis not present

## 2016-12-19 ENCOUNTER — Ambulatory Visit
Admission: RE | Admit: 2016-12-19 | Discharge: 2016-12-19 | Disposition: A | Discharge status: Home / Self Care | Payer: Medicaid Other | Source: Ambulatory Visit | Attending: Radiation Oncology | Admitting: Radiation Oncology

## 2016-12-19 DIAGNOSIS — C20 Malignant neoplasm of rectum: Secondary | ICD-10-CM | POA: Diagnosis not present

## 2016-12-19 DIAGNOSIS — Z510 Encounter for antineoplastic radiation therapy: Secondary | ICD-10-CM | POA: Diagnosis not present

## 2016-12-19 DIAGNOSIS — Z933 Colostomy status: Secondary | ICD-10-CM | POA: Diagnosis not present

## 2016-12-20 ENCOUNTER — Ambulatory Visit
Admission: RE | Admit: 2016-12-20 | Discharge: 2016-12-20 | Disposition: A | Discharge status: Home / Self Care | Payer: Medicaid Other | Source: Ambulatory Visit | Attending: Radiation Oncology | Admitting: Radiation Oncology

## 2016-12-20 DIAGNOSIS — C20 Malignant neoplasm of rectum: Secondary | ICD-10-CM | POA: Diagnosis not present

## 2016-12-20 DIAGNOSIS — Z933 Colostomy status: Secondary | ICD-10-CM | POA: Diagnosis not present

## 2016-12-20 DIAGNOSIS — Z510 Encounter for antineoplastic radiation therapy: Secondary | ICD-10-CM | POA: Diagnosis not present

## 2016-12-23 ENCOUNTER — Ambulatory Visit
Admission: RE | Admit: 2016-12-23 | Discharge: 2016-12-23 | Disposition: A | Discharge status: Home / Self Care | Payer: Medicaid Other | Source: Ambulatory Visit | Attending: Radiation Oncology | Admitting: Radiation Oncology

## 2016-12-23 DIAGNOSIS — Z510 Encounter for antineoplastic radiation therapy: Secondary | ICD-10-CM | POA: Diagnosis not present

## 2016-12-24 ENCOUNTER — Ambulatory Visit
Admission: RE | Admit: 2016-12-24 | Discharge: 2016-12-24 | Disposition: A | Discharge status: Home / Self Care | Payer: Medicaid Other | Source: Ambulatory Visit | Attending: Radiation Oncology | Admitting: Radiation Oncology

## 2016-12-24 DIAGNOSIS — Z933 Colostomy status: Secondary | ICD-10-CM | POA: Diagnosis not present

## 2016-12-24 DIAGNOSIS — Z510 Encounter for antineoplastic radiation therapy: Secondary | ICD-10-CM | POA: Diagnosis not present

## 2016-12-24 DIAGNOSIS — C20 Malignant neoplasm of rectum: Secondary | ICD-10-CM | POA: Diagnosis not present

## 2016-12-25 ENCOUNTER — Telehealth: Payer: Self-pay | Admitting: Oncology

## 2016-12-25 ENCOUNTER — Other Ambulatory Visit (HOSPITAL_BASED_OUTPATIENT_CLINIC_OR_DEPARTMENT_OTHER): Payer: Medicaid Other

## 2016-12-25 ENCOUNTER — Ambulatory Visit (HOSPITAL_BASED_OUTPATIENT_CLINIC_OR_DEPARTMENT_OTHER): Payer: Medicaid Other | Admitting: Oncology

## 2016-12-25 ENCOUNTER — Ambulatory Visit
Admission: RE | Admit: 2016-12-25 | Discharge: 2016-12-25 | Disposition: A | Discharge status: Home / Self Care | Payer: Medicaid Other | Source: Ambulatory Visit | Attending: Radiation Oncology | Admitting: Radiation Oncology

## 2016-12-25 VITALS — BP 120/68 | HR 79 | Temp 97.9°F | Resp 18 | Ht 67.0 in | Wt 130.9 lb

## 2016-12-25 DIAGNOSIS — C20 Malignant neoplasm of rectum: Secondary | ICD-10-CM

## 2016-12-25 DIAGNOSIS — Z510 Encounter for antineoplastic radiation therapy: Secondary | ICD-10-CM | POA: Diagnosis not present

## 2016-12-25 DIAGNOSIS — D630 Anemia in neoplastic disease: Secondary | ICD-10-CM

## 2016-12-25 DIAGNOSIS — Z933 Colostomy status: Secondary | ICD-10-CM | POA: Diagnosis not present

## 2016-12-25 LAB — CBC WITH DIFFERENTIAL/PLATELET
BASO%: 0.3 % (ref 0.0–2.0)
Basophils Absolute: 0 10*3/uL (ref 0.0–0.1)
EOS ABS: 0.3 10*3/uL (ref 0.0–0.5)
EOS%: 5.2 % (ref 0.0–7.0)
HCT: 40.2 % (ref 34.8–46.6)
HEMOGLOBIN: 13.6 g/dL (ref 11.6–15.9)
LYMPH%: 17.8 % (ref 14.0–49.7)
MCH: 27.8 pg (ref 25.1–34.0)
MCHC: 33.7 g/dL (ref 31.5–36.0)
MCV: 82.5 fL (ref 79.5–101.0)
MONO#: 0.5 10*3/uL (ref 0.1–0.9)
MONO%: 10.1 % (ref 0.0–14.0)
NEUT%: 66.6 % (ref 38.4–76.8)
NEUTROS ABS: 3.3 10*3/uL (ref 1.5–6.5)
PLATELETS: 242 10*3/uL (ref 145–400)
RBC: 4.88 10*6/uL (ref 3.70–5.45)
RDW: 20.1 % — AB (ref 11.2–14.5)
WBC: 4.9 10*3/uL (ref 3.9–10.3)
lymph#: 0.9 10*3/uL (ref 0.9–3.3)

## 2016-12-25 NOTE — Progress Notes (Signed)
  [facility] OFFICE PROGRESS NOTE   Diagnosis: Rectal cancer  INTERVAL HISTORY:   Wanda Baker returns as scheduled. She continues Xeloda and radiation. Diarrhea is controlled with Imodium. No mouth sores or hand/foot pain. No complaint.  Objective:  Vital signs in last 24 hours:  Blood pressure 120/68, pulse 79, temperature 97.9 F (36.6 C), temperature source Oral, resp. rate 18, height '5\' 7"'$  (1.702 m), weight 130 lb 14.4 oz (59.4 kg), SpO2 100 %.    HEENT: No thrush or ulcers Resp: Lungs clear bilaterally Cardio: Regular rate and rhythm GI: No hepatomegaly, nontender Vascular: No leg edema  Skin: Mild hyperpigmentation at the soles without skin breakdown     Lab Results:  Lab Results  Component Value Date   WBC 4.9 12/25/2016   HGB 13.6 12/25/2016   HCT 40.2 12/25/2016   MCV 82.5 12/25/2016   PLT 242 12/25/2016   NEUTROABS 3.3 12/25/2016    Lab Results  Component Value Date   CEA1 <1.00 10/14/2016     Medications: I have reviewed the patient's current medications.  Assessment/Plan: 1. 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 Tumor noted at 15 cm on sigmoidoscopy 09/17/2016 ? CT abdomen/pelvis 09/16/2016-colon obstruction, mass at the "distal sigmoid colon " ? Elevated preoperative CEA ? MSI-stable, no loss of mismatch repair protein expression ? 0/22 lymph nodes positive, extracellular mucin without tumor on the inked serosal surface ? Initiation of Xeloda/radiation 11/21/2016  2. Colonic obstruction secondary to #1  3. Iron deficiency anemia secondary to #1. Improved.    Disposition:  Wanda Baker is tolerating the Xeloda and radiation well. She will complete treatment 12/31/2016.  She will see Dr. Dalbert Batman to discuss reversal of the colostomy.  She will return for an office visit on 01/21/2017 to discuss  the indication for additional adjuvant Xeloda.  15 minutes were spent with the patient today. The majority of the time was used for counseling and coordination of care.  Donneta Romberg, MD  12/25/2016  6:28 PM

## 2016-12-25 NOTE — Telephone Encounter (Signed)
Scheduled appt per 7/11 los - Gave patient AVS and calender per los.  

## 2016-12-26 ENCOUNTER — Ambulatory Visit
Admission: RE | Admit: 2016-12-26 | Discharge: 2016-12-26 | Disposition: A | Discharge status: Home / Self Care | Payer: Medicaid Other | Source: Ambulatory Visit | Attending: Radiation Oncology | Admitting: Radiation Oncology

## 2016-12-26 ENCOUNTER — Ambulatory Visit: Payer: Medicaid Other

## 2016-12-26 DIAGNOSIS — C20 Malignant neoplasm of rectum: Secondary | ICD-10-CM | POA: Diagnosis not present

## 2016-12-26 DIAGNOSIS — Z933 Colostomy status: Secondary | ICD-10-CM | POA: Diagnosis not present

## 2016-12-26 DIAGNOSIS — Z510 Encounter for antineoplastic radiation therapy: Secondary | ICD-10-CM | POA: Diagnosis not present

## 2016-12-27 ENCOUNTER — Ambulatory Visit
Admission: RE | Admit: 2016-12-27 | Discharge: 2016-12-27 | Disposition: A | Discharge status: Home / Self Care | Payer: Medicaid Other | Source: Ambulatory Visit | Attending: Radiation Oncology | Admitting: Radiation Oncology

## 2016-12-27 ENCOUNTER — Ambulatory Visit: Payer: Medicaid Other

## 2016-12-27 DIAGNOSIS — Z933 Colostomy status: Secondary | ICD-10-CM | POA: Diagnosis not present

## 2016-12-27 DIAGNOSIS — Z510 Encounter for antineoplastic radiation therapy: Secondary | ICD-10-CM | POA: Diagnosis not present

## 2016-12-27 DIAGNOSIS — C20 Malignant neoplasm of rectum: Secondary | ICD-10-CM | POA: Diagnosis not present

## 2016-12-30 ENCOUNTER — Ambulatory Visit: Payer: Medicaid Other

## 2016-12-30 ENCOUNTER — Ambulatory Visit
Admission: RE | Admit: 2016-12-30 | Discharge: 2016-12-30 | Disposition: A | Discharge status: Home / Self Care | Payer: Medicaid Other | Source: Ambulatory Visit | Attending: Radiation Oncology | Admitting: Radiation Oncology

## 2016-12-30 DIAGNOSIS — Z510 Encounter for antineoplastic radiation therapy: Secondary | ICD-10-CM | POA: Diagnosis not present

## 2016-12-31 ENCOUNTER — Encounter: Payer: Self-pay | Admitting: Radiation Oncology

## 2016-12-31 ENCOUNTER — Ambulatory Visit
Admission: RE | Admit: 2016-12-31 | Discharge: 2016-12-31 | Disposition: A | Discharge status: Home / Self Care | Payer: Medicaid Other | Source: Ambulatory Visit | Attending: Radiation Oncology | Admitting: Radiation Oncology

## 2016-12-31 DIAGNOSIS — Z933 Colostomy status: Secondary | ICD-10-CM | POA: Diagnosis not present

## 2016-12-31 DIAGNOSIS — C20 Malignant neoplasm of rectum: Secondary | ICD-10-CM | POA: Diagnosis not present

## 2016-12-31 DIAGNOSIS — Z510 Encounter for antineoplastic radiation therapy: Secondary | ICD-10-CM | POA: Diagnosis not present

## 2017-01-06 ENCOUNTER — Encounter: Payer: Self-pay | Admitting: *Deleted

## 2017-01-08 NOTE — Progress Notes (Signed)
  Radiation Oncology         [phone] ________________________________  Name: Wanda Baker MRN: [id number]  Date: 12/31/2016  DOB: 1960-09-25  End of Treatment Note  Diagnosis:  Stage II, T3, N0, M0 adenocarcinoma of the rectum.      Indication for treatment:  curative       Radiation treatment dates:   11/21/2016 to 12/31/2016  Site/dose:    1. The rectum was treated to 45 Gy in 25 fractions at 1.8 Gy per fraction. 2. The rectum was boosted to 5.4 Gy in 3 fractions at 1.8 Gy per fraction.   Beams/energy:    1. 3D // 15X/6X 2. Photon boost // 15X/6X  Narrative: The patient tolerated radiation treatment relatively well.  Denies diarrhea. Appetite is good. Denies pain. Reports having some minimal gas.  Plan: The patient has completed radiation treatment. The patient will return to radiation oncology clinic for routine followup in one month. I advised them to call or return sooner if they have any questions or concerns related to their recovery or treatment.  ------------------------------------------------  Jodelle Gross, MD, PhD  This document serves as a record of services personally performed by [name], MD. It was created on his behalf by Arlyce Harman, a trained medical scribe. The creation of this record is based on the scribe's personal observations and the provider's statements to them. This document has been checked and approved by the attending provider.

## 2017-01-21 ENCOUNTER — Other Ambulatory Visit (HOSPITAL_BASED_OUTPATIENT_CLINIC_OR_DEPARTMENT_OTHER): Payer: Medicaid Other

## 2017-01-21 ENCOUNTER — Telehealth: Payer: Self-pay | Admitting: Nurse Practitioner

## 2017-01-21 ENCOUNTER — Ambulatory Visit (HOSPITAL_BASED_OUTPATIENT_CLINIC_OR_DEPARTMENT_OTHER): Payer: Medicaid Other | Admitting: Nurse Practitioner

## 2017-01-21 VITALS — BP 143/72 | HR 84 | Temp 98.3°F | Resp 18 | Ht 67.0 in | Wt 135.6 lb

## 2017-01-21 DIAGNOSIS — C20 Malignant neoplasm of rectum: Secondary | ICD-10-CM

## 2017-01-21 DIAGNOSIS — D630 Anemia in neoplastic disease: Secondary | ICD-10-CM | POA: Diagnosis not present

## 2017-01-21 LAB — COMPREHENSIVE METABOLIC PANEL
ALBUMIN: 3.5 g/dL (ref 3.5–5.0)
ALK PHOS: 89 U/L (ref 40–150)
ALT: 17 U/L (ref 0–55)
ANION GAP: 8 meq/L (ref 3–11)
AST: 15 U/L (ref 5–34)
BILIRUBIN TOTAL: 0.39 mg/dL (ref 0.20–1.20)
BUN: 11 mg/dL (ref 7.0–26.0)
CO2: 27 meq/L (ref 22–29)
CREATININE: 0.7 mg/dL (ref 0.6–1.1)
Calcium: 9.9 mg/dL (ref 8.4–10.4)
Chloride: 105 mEq/L (ref 98–109)
GLUCOSE: 140 mg/dL (ref 70–140)
Potassium: 4.3 mEq/L (ref 3.5–5.1)
SODIUM: 140 meq/L (ref 136–145)
TOTAL PROTEIN: 6.7 g/dL (ref 6.4–8.3)

## 2017-01-21 LAB — CBC WITH DIFFERENTIAL/PLATELET
BASO%: 0.3 % (ref 0.0–2.0)
Basophils Absolute: 0 10*3/uL (ref 0.0–0.1)
EOS ABS: 0.2 10*3/uL (ref 0.0–0.5)
EOS%: 3.2 % (ref 0.0–7.0)
HCT: 39.9 % (ref 34.8–46.6)
HEMOGLOBIN: 13.4 g/dL (ref 11.6–15.9)
LYMPH%: 18.7 % (ref 14.0–49.7)
MCH: 28.1 pg (ref 25.1–34.0)
MCHC: 33.5 g/dL (ref 31.5–36.0)
MCV: 83.9 fL (ref 79.5–101.0)
MONO#: 0.5 10*3/uL (ref 0.1–0.9)
MONO%: 10.5 % (ref 0.0–14.0)
NEUT%: 67.3 % (ref 38.4–76.8)
NEUTROS ABS: 3.5 10*3/uL (ref 1.5–6.5)
PLATELETS: 250 10*3/uL (ref 145–400)
RBC: 4.76 10*6/uL (ref 3.70–5.45)
RDW: 20.6 % — ABNORMAL HIGH (ref 11.2–14.5)
WBC: 5.2 10*3/uL (ref 3.9–10.3)
lymph#: 1 10*3/uL (ref 0.9–3.3)

## 2017-01-21 NOTE — Telephone Encounter (Signed)
Scheduled appt per 8/7 los - Gave patient AVS and calender per los.  

## 2017-01-21 NOTE — Progress Notes (Addendum)
  [place] OFFICE PROGRESS NOTE   Diagnosis:  Rectal cancer  INTERVAL HISTORY:   Wanda Baker returns as scheduled. She feels well. No diarrhea. No bleeding. No pain. She has a good appetite. She reports weight gain.  Objective:  Vital signs in last 24 hours:  Blood pressure (!) 143/72, pulse 84, temperature 98.3 F (36.8 C), temperature source Oral, resp. rate 18, height '5\' 7"'$  (1.702 m), weight 135 lb 9.6 oz (61.5 kg), SpO2 100 %.    HEENT: No thrush or ulcers. Resp: Lungs clear bilaterally. Cardio: Regular rate and rhythm. GI: Abdomen soft and nontender. No hepatomegaly. Left lower quadrant colostomy. Vascular: No leg edema. Calves soft and nontender. Skin: Palms without erythema.    Lab Results:  Lab Results  Component Value Date   WBC 5.2 01/21/2017   HGB 13.4 01/21/2017   HCT 39.9 01/21/2017   MCV 83.9 01/21/2017   PLT 250 01/21/2017   NEUTROABS 3.5 01/21/2017    Imaging:  No results found.  Medications: I have reviewed the patient's current medications.  Assessment/Plan: 1. 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 Tumor noted at 15 cm on sigmoidoscopy 09/17/2016 ? CT abdomen/pelvis 09/16/2016-colon obstruction, mass at the "distal sigmoid colon " ? Elevated preoperative CEA ? MSI-stable, no loss of mismatch repair protein expression ? 0/22 lymph nodes positive, extracellular mucin without tumor on the inked serosal surface ? Initiation of Xeloda/radiation 11/21/2016; completion of Xeloda/radiation 12/31/2016  2. Colonic obstruction secondary to #1  3. Iron deficiency anemia secondary to #1. Improved.    Disposition: Wanda Baker appears stable. She has completed the course of Xeloda/radiation. Dr. Benay Spice recommends adjuvant Xeloda for 6 additional cycles 2 weeks on/1 week off to complete 6 months of adjuvant  therapy total. Wanda Baker is in agreement with this plan. We reviewed potential toxicities associated with Xeloda including nausea, mouth sores, diarrhea, hand-foot syndrome, rash, increased sensitivity to sun. She will begin cycle 1 on 01/27/2017. We will see her in follow-up on 02/11/2017. She will contact the office in the interim with any problems.  Patient seen with Dr. Benay Spice.    Ned Card ANP/GNP-BC   01/21/2017  10:29 AM This was a shared visit with Ned Card. Wanda Baker has completed the planned course of adjuvant Xeloda/radiation. I recommend she completed a course of adjuvant Xeloda. She agrees. The plan is to begin adjuvant Xeloda 01/27/2017.  Julieanne Manson, M.D.

## 2017-01-23 ENCOUNTER — Telehealth: Payer: Self-pay | Admitting: Pharmacist

## 2017-01-23 DIAGNOSIS — C20 Malignant neoplasm of rectum: Secondary | ICD-10-CM

## 2017-01-23 MED ORDER — CAPECITABINE 500 MG PO TABS: 1500.0000 mg | ORAL_TABLET | Freq: Two times a day (BID) | ORAL | 0 refills | Status: DC

## 2017-01-23 MED FILL — XELODA 500 MG TABLET: 500 | 21 days supply | Qty: 84 | Fill #0

## 2017-01-23 NOTE — Telephone Encounter (Signed)
Oral Oncology Pharmacist Encounter  Received new prescription for Xeloda for the adjuvant treatment of rectal cancer, planned duration 6 cycles of 21 day cycle.  Labs from 01/21/17 assessed, OK for treatment.  Current medication list in Epic reviewed, no significant DDIs with Xeloda identified.  Prescription has been e-scribed to the [facility] for benefits analysis and approval. Noted patient without insurance coverage. She had obtained her Xeloda for concurrent chemoradiation with assistance of [place].  Remaining [facility] fund balance $396.08. We have reached out to the pharmacy for copayment information.   I spoke with patient for overview of: Xeloda for the adjuvant treatment of rectal cancer, planned duration 6 months.   Pt is doing well. The prescriptions have been sent to the [facility] for benefit analysis and approval.   Counseled patient on administration, dosing, side effects, safe handling, and monitoring. Patient will take Xeloda 500mg  tablets , 3 tablets (1500mg ) by mouth in AM and 3 tabs (1500mg ) in PM, within 30 minutes of finishing a meal, for 14 days on, 7 days off, repeat every 21 days for 12 planned cycles.  Side effects include but not limited to: GI upset, diarrhea, decreasd blood counts, fatigue, and hand-foot syndrome.   Patient endorses mild diarrhea only as a side effect from concurrent chemoradiation treatment.  Reviewed with patient importance of keeping a medication schedule and plan for any missed doses.  Ms. Flamm voiced understanding and appreciation.   All questions answered.  Will follow up with patient regarding copayment and when Xeloda will be ready at the pharmacy.   Patient knows to call the office with questions or concerns. Oral Oncology Clinic will continue to follow.  Thank you,  Johny Drilling, PharmD, BCPS, BCOP 01/23/2017  10:16 AM Oral Oncology Clinic [phone]

## 2017-01-27 NOTE — Progress Notes (Addendum)
Wanda Baker. [place] 56 y.o. woman with Stage II, T3, N0, M0 adenocarcinoma of the rectum radiation completed 12-31-16, FU.   Pain:None Fatigue:Denies fatigue Loss of appetite: Reports having a good appetite has gained 4 lbs. Monitor for weight loss:Gaoned four lbs. Taking Xeloda ?Yes Nausea/vomiting:No Diarrhea:No Rectal bleeding:No Skin irritation:Colsostomy site healthy pink,cleaning daily with soap and water.  Will see Dr. Fanny Skates on 03-06-17 for follow up visit. 01-21-17 Saw Dr. Benay Spice  Wanda Baker appears stable. She has completed the course of Xeloda/radiation. Dr. Benay Spice recommends adjuvant Xeloda for 6 additional cycles 2 weeks on/1 week off to complete 6 months of adjuvant therapy total. Wanda Baker is in agreement with this plan. We reviewed potential toxicities associated with Xeloda including nausea, mouth sores, diarrhea, hand-foot syndrome, rash, increased sensitivity to sun. She will begin cycle 1 on 01/27/2017. We will see her in follow-up on 02/11/2017. She will contact the office in the interim with any problems.  Wt Readings from Last 3 Encounters:  02/03/17 139 lb 3.2 oz (63.1 kg)  01/21/17 135 lb 9.6 oz (61.5 kg)  12/25/16 130 lb 14.4 oz (59.4 kg)  BP 136/71   Pulse 76   Temp 97.8 F (36.6 C) (Oral)   Resp 18   Ht 5\' 7"  (1.702 m)   Wt 139 lb 3.2 oz (63.1 kg)   SpO2 100%   BMI 21.80 kg/m

## 2017-02-03 ENCOUNTER — Ambulatory Visit
Admission: RE | Admit: 2017-02-03 | Discharge: 2017-02-03 | Disposition: A | Discharge status: Home / Self Care | Payer: Medicaid Other | Source: Ambulatory Visit | Attending: Radiation Oncology | Admitting: Radiation Oncology

## 2017-02-03 VITALS — BP 136/71 | HR 76 | Temp 97.8°F | Resp 18 | Ht 67.0 in | Wt 139.2 lb

## 2017-02-03 DIAGNOSIS — Z79899 Other long term (current) drug therapy: Secondary | ICD-10-CM | POA: Insufficient documentation

## 2017-02-03 DIAGNOSIS — Z79891 Long term (current) use of opiate analgesic: Secondary | ICD-10-CM | POA: Insufficient documentation

## 2017-02-03 DIAGNOSIS — C20 Malignant neoplasm of rectum: Secondary | ICD-10-CM | POA: Diagnosis not present

## 2017-02-03 DIAGNOSIS — Z923 Personal history of irradiation: Secondary | ICD-10-CM | POA: Insufficient documentation

## 2017-02-03 DIAGNOSIS — Z880 Allergy status to penicillin: Secondary | ICD-10-CM | POA: Insufficient documentation

## 2017-02-03 NOTE — Progress Notes (Signed)
Radiation Oncology         [phone] ________________________________  Name: Wanda Baker MRN: [id number]  Date of Service: 02/03/2017 DOB: Aug 18, 1960  Post Treatment Note  CC: Tonette Bihari, MD  Ladell Pier, MD  Diagnosis:    Stage II, T3, N0, M0 adenocarcinoma of the rectum  Interval Since Last Radiation:  5 weeks   11/21/2016 to 12/31/2016: 1. The rectum was treated to 45 Gy in 25 fractions at 1.8 Gy per fraction. 2. The rectum was boosted to 5.4 Gy in 3 fractions at 1.8 Gy per fraction.   Narrative:  The patient returns today for routine follow-up.  In summary this is a pleasant patient of Dr. Audie Pinto and Dr. Gearldine Shown with a history of rectal cancer. She was taken to the OR due to obstruction in April 2018 where she underwent a LAR with colostomy and final pathology revealed invasive adenocarcinoma with extracellular mucin, spanning 5 cm. Resection margins were negative and all lymph nodes were negative for malignancy. She completed adjuvant chemoRT with Xeloda. She tolerated treatment well and recently started single agent Xeloda back.                          On review of systems, the patient states she's feeling great. She is ready to have a colostomy reversal and will meet with Dr. Dalbert Batman on 03/06/17. She denies any rectal bleeding or mucosal leakage. She denies fevers, chills, abdominal pain, or diarrhea currently. She reports diarrhea resolved within a few weeks of completing treatment. She denies any nausea. No other complaints are verbalized.  ALLERGIES:  is allergic to prednisone; vicodin [hydrocodone-acetaminophen]; and penicillins.  Meds: Current Outpatient Prescriptions  Medication Sig Dispense Refill  . capecitabine (XELODA) 500 MG tablet Take 3 tablets (1,500 mg total) by mouth 2 (two) times daily after a meal. Take for 14 days on, 7 days off. Repeat every 21 days. 84 tablet 0  . ferrous sulfate 325 (65 FE) MG tablet Take 1 tablet (325 mg total) by mouth  3 (three) times daily with meals. 90 tablet 0  . gabapentin (NEURONTIN) 300 MG capsule Take 1 capsule (300 mg total) by mouth 3 (three) times daily. 90 capsule 2  . cetirizine (ZYRTEC) 10 MG tablet Take 10 mg by mouth daily.    . montelukast (SINGULAIR) 10 MG tablet TAKE 1 TABLET BY MOUTH AT BEDTIME (Patient not taking: Reported on 09/16/2016) 30 tablet 11  . omeprazole (PRILOSEC) 20 MG capsule TAKE ONE CAPSULE BY MOUTH EVERY DAY (Patient not taking: Reported on 09/16/2016) 30 capsule 5  . prochlorperazine (COMPAZINE) 10 MG tablet Take 1 tablet (10 mg total) by mouth every 6 (six) hours as needed for nausea or vomiting. (Patient not taking: Reported on 10/14/2016) 30 tablet 0  . saccharomyces boulardii (FLORASTOR) 250 MG capsule Take 1 capsule (250 mg total) by mouth 2 (two) times daily. (Patient not taking: Reported on 02/03/2017) 60 capsule 0  . traMADol (ULTRAM) 50 MG tablet Take 2 tablets (100 mg total) by mouth every 6 (six) hours as needed for moderate pain. (Patient not taking: Reported on 10/14/2016) 30 tablet 0  . triamcinolone (NASACORT AQ) 55 MCG/ACT nasal inhaler Place 2 sprays into the nose daily. (Patient not taking: Reported on 09/16/2016) 1 Inhaler 12   No current facility-administered medications for this encounter.     Physical Findings:  height is 5\' 7"  (1.702 m) and weight is 139 lb 3.2 oz (63.1 kg).  Her oral temperature is 97.8 F (36.6 C). Her blood pressure is 136/71 and her pulse is 76. Her respiration is 18 and oxygen saturation is 100%.  Pain Assessment Pain Score: 0-No pain/10 In general this is a well appearing caucasian female in no acute distress. She's alert and oriented x4 and appropriate throughout the examination. Cardiopulmonary assessment is negative for acute distress and she exhibits normal effort. Her midline laparotomy site is intact without separation. Her ostomy is pink and patent and gas is in the bag.  Lab Findings: Lab Results  Component Value Date   WBC 5.2  01/21/2017   HGB 13.4 01/21/2017   HCT 39.9 01/21/2017   MCV 83.9 01/21/2017   PLT 250 01/21/2017     Radiographic Findings: No results found.  Impression/Plan: 1.  Stage II, T3, N0, M0 adenocarcinoma of the rectum. The patient is doing well and appears to have recovered from the effects of radiotherapy. She will continue Xeloda with Dr. Benay Spice and Ned Card, NP. She will return to our clinic as needed moving forward. She states agreement and understanding.     Carola Rhine, PAC

## 2017-02-05 ENCOUNTER — Telehealth: Payer: Self-pay | Admitting: Oncology

## 2017-02-05 NOTE — Telephone Encounter (Signed)
Spoke to patient regarding October appointment. Patient will reschedule appointment after upcoming August appointment.

## 2017-02-10 ENCOUNTER — Other Ambulatory Visit: Payer: Self-pay | Admitting: Oncology

## 2017-02-10 DIAGNOSIS — C20 Malignant neoplasm of rectum: Secondary | ICD-10-CM

## 2017-02-11 ENCOUNTER — Other Ambulatory Visit: Payer: Self-pay

## 2017-02-11 ENCOUNTER — Ambulatory Visit (HOSPITAL_BASED_OUTPATIENT_CLINIC_OR_DEPARTMENT_OTHER): Payer: Medicaid Other | Admitting: Oncology

## 2017-02-11 ENCOUNTER — Telehealth: Payer: Self-pay | Admitting: Oncology

## 2017-02-11 ENCOUNTER — Other Ambulatory Visit (HOSPITAL_BASED_OUTPATIENT_CLINIC_OR_DEPARTMENT_OTHER): Payer: Medicaid Other

## 2017-02-11 VITALS — BP 149/82 | HR 81 | Temp 98.9°F | Resp 18 | Ht 67.0 in | Wt 140.9 lb

## 2017-02-11 DIAGNOSIS — C20 Malignant neoplasm of rectum: Secondary | ICD-10-CM

## 2017-02-11 DIAGNOSIS — D630 Anemia in neoplastic disease: Secondary | ICD-10-CM

## 2017-02-11 LAB — CBC WITH DIFFERENTIAL/PLATELET
BASO%: 0.2 % (ref 0.0–2.0)
Basophils Absolute: 0 10*3/uL (ref 0.0–0.1)
EOS%: 2.5 % (ref 0.0–7.0)
Eosinophils Absolute: 0.1 10*3/uL (ref 0.0–0.5)
HEMATOCRIT: 37.6 % (ref 34.8–46.6)
HEMOGLOBIN: 12.8 g/dL (ref 11.6–15.9)
LYMPH%: 21.2 % (ref 14.0–49.7)
MCH: 30 pg (ref 25.1–34.0)
MCHC: 34 g/dL (ref 31.5–36.0)
MCV: 88.1 fL (ref 79.5–101.0)
MONO#: 0.6 10*3/uL (ref 0.1–0.9)
MONO%: 11.5 % (ref 0.0–14.0)
NEUT%: 64.6 % (ref 38.4–76.8)
NEUTROS ABS: 3.4 10*3/uL (ref 1.5–6.5)
PLATELETS: 215 10*3/uL (ref 145–400)
RBC: 4.27 10*6/uL (ref 3.70–5.45)
RDW: 18.2 % — ABNORMAL HIGH (ref 11.2–14.5)
WBC: 5.3 10*3/uL (ref 3.9–10.3)
lymph#: 1.1 10*3/uL (ref 0.9–3.3)

## 2017-02-11 LAB — COMPREHENSIVE METABOLIC PANEL
ALBUMIN: 3.6 g/dL (ref 3.5–5.0)
ALT: 20 U/L (ref 0–55)
AST: 17 U/L (ref 5–34)
Alkaline Phosphatase: 78 U/L (ref 40–150)
Anion Gap: 6 mEq/L (ref 3–11)
BUN: 7.8 mg/dL (ref 7.0–26.0)
CHLORIDE: 106 meq/L (ref 98–109)
CO2: 28 mEq/L (ref 22–29)
Calcium: 9.7 mg/dL (ref 8.4–10.4)
Creatinine: 0.7 mg/dL (ref 0.6–1.1)
Glucose: 101 mg/dl (ref 70–140)
POTASSIUM: 4 meq/L (ref 3.5–5.1)
SODIUM: 140 meq/L (ref 136–145)
Total Bilirubin: 0.49 mg/dL (ref 0.20–1.20)
Total Protein: 7 g/dL (ref 6.4–8.3)

## 2017-02-11 MED ORDER — CAPECITABINE 500 MG PO TABS: 1500.0000 mg | ORAL_TABLET | Freq: Two times a day (BID) | ORAL | 0 refills | Status: DC

## 2017-02-11 MED FILL — XELODA 500 MG TABLET: 500 | 21 days supply | Qty: 84 | Fill #0

## 2017-02-11 NOTE — Progress Notes (Signed)
  [place] OFFICE PROGRESS NOTE   Diagnosis: Rectal cancer  INTERVAL HISTORY:   Wanda Baker returns as scheduled. She began adjuvant Xeloda 01/27/2017. No nausea, mouth sores, or hand/foot pain. She feels well.  Objective:  Vital signs in last 24 hours:  Blood pressure (!) 149/82, pulse 81, temperature 98.9 F (37.2 C), temperature source Oral, resp. rate 18, height _0  (1.702 m), weight 140 lb 14.4 oz (63.9 kg), SpO2 100 %.    HEENT: No thrush or ulcers Resp: Lungs clear bilaterally Cardio: Regular rate and rhythm GI: No hepatosplenomegaly, left lower quadrant colostomy Vascular: No leg edema  Skin: Palms and soles without erythema or skin breakdown. Mild hyperpigmentation of the soles     Lab Results:  Lab Results  Component Value Date   WBC 5.3 02/11/2017   HGB 12.8 02/11/2017   HCT 37.6 02/11/2017   MCV 88.1 02/11/2017   PLT 215 02/11/2017   NEUTROABS 3.4 02/11/2017    CMP     Component Value Date/Time   NA 140 02/11/2017 0856   K 4.0 02/11/2017 0856   CL 102 09/25/2016 0611   CO2 28 02/11/2017 0856   GLUCOSE 101 02/11/2017 0856   BUN 7.8 02/11/2017 0856   CREATININE 0.7 02/11/2017 0856   CALCIUM 9.7 02/11/2017 0856   PROT 7.0 02/11/2017 0856   ALBUMIN 3.6 02/11/2017 0856   AST 17 02/11/2017 0856   ALT 20 02/11/2017 0856   ALKPHOS 78 02/11/2017 0856   BILITOT 0.49 02/11/2017 0856   GFRNONAA >60 09/25/2016 0611   GFRNONAA >89 09/21/2015 1502   GFRAA >60 09/25/2016 0611   GFRAA >89 09/21/2015 1502    Lab Results  Component Value Date   CEA1 <1.00 10/14/2016    Medications: I have reviewed the patient's current medications.  Assessment/Plan: 1. 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 Tumor noted at 15 cm on sigmoidoscopy 09/17/2016 ? CT abdomen/pelvis 09/16/2016-colon obstruction, mass at the "distal  sigmoid colon " ? Elevated preoperative CEA ? MSI-stable, no loss of mismatch repair protein expression ? 0/22 lymph nodes positive, extracellular mucin without tumor on the inked serosal surface ? Initiation of Xeloda/radiation 11/21/2016; completion of Xeloda/radiation 12/31/2016 ? Cycle 1 adjuvant Xeloda 01/27/2017 ? Cycle 2 adjuvant Xeloda 02/17/2017  2. Colonic obstruction secondary to #1  3. Iron deficiency anemia secondary to #1. Improved.    Disposition:  Wanda Baker appears well. She tolerated the first cycle of adjuvant Xeloda without significant toxicity. She will begin cycle 2 on 02/17/2017. Wanda Baker will return for an office and lab visit on 03/04/2017.  25 minutes were spent with the patient today. The majority of the time was used for counseling and coordination of care.  Donneta Romberg, MD  02/11/2017  10:01 AM

## 2017-02-11 NOTE — Telephone Encounter (Signed)
Scheduled appt per 8/28 los - Gave patient AVS and calender per los.

## 2017-02-27 ENCOUNTER — Ambulatory Visit: Payer: Medicaid Other | Admitting: Internal Medicine

## 2017-03-04 ENCOUNTER — Other Ambulatory Visit (HOSPITAL_BASED_OUTPATIENT_CLINIC_OR_DEPARTMENT_OTHER): Payer: Medicaid Other

## 2017-03-04 ENCOUNTER — Telehealth: Payer: Self-pay | Admitting: Nurse Practitioner

## 2017-03-04 ENCOUNTER — Other Ambulatory Visit: Payer: Self-pay | Admitting: Emergency Medicine

## 2017-03-04 ENCOUNTER — Ambulatory Visit (HOSPITAL_BASED_OUTPATIENT_CLINIC_OR_DEPARTMENT_OTHER): Payer: Medicaid Other | Admitting: Nurse Practitioner

## 2017-03-04 VITALS — BP 138/68 | HR 81 | Temp 97.9°F | Resp 18 | Ht 67.0 in | Wt 145.0 lb

## 2017-03-04 DIAGNOSIS — C20 Malignant neoplasm of rectum: Secondary | ICD-10-CM | POA: Diagnosis not present

## 2017-03-04 DIAGNOSIS — D630 Anemia in neoplastic disease: Secondary | ICD-10-CM

## 2017-03-04 LAB — CBC WITH DIFFERENTIAL/PLATELET
BASO%: 0.3 % (ref 0.0–2.0)
Basophils Absolute: 0 10*3/uL (ref 0.0–0.1)
EOS%: 1.9 % (ref 0.0–7.0)
Eosinophils Absolute: 0.1 10*3/uL (ref 0.0–0.5)
HEMATOCRIT: 37.5 % (ref 34.8–46.6)
HEMOGLOBIN: 12.7 g/dL (ref 11.6–15.9)
LYMPH#: 1.1 10*3/uL (ref 0.9–3.3)
LYMPH%: 19.1 % (ref 14.0–49.7)
MCH: 31.6 pg (ref 25.1–34.0)
MCHC: 33.9 g/dL (ref 31.5–36.0)
MCV: 93.3 fL (ref 79.5–101.0)
MONO#: 0.6 10*3/uL (ref 0.1–0.9)
MONO%: 10.2 % (ref 0.0–14.0)
NEUT#: 3.9 10*3/uL (ref 1.5–6.5)
NEUT%: 68.5 % (ref 38.4–76.8)
Platelets: 212 10*3/uL (ref 145–400)
RBC: 4.02 10*6/uL (ref 3.70–5.45)
RDW: 19.6 % — AB (ref 11.2–14.5)
WBC: 5.8 10*3/uL (ref 3.9–10.3)

## 2017-03-04 MED ORDER — CAPECITABINE 500 MG PO TABS: 1500.0000 mg | ORAL_TABLET | Freq: Two times a day (BID) | ORAL | 0 refills | Status: DC

## 2017-03-04 NOTE — Progress Notes (Signed)
  [facility] OFFICE PROGRESS NOTE   Diagnosis:  Rectal cancer  INTERVAL HISTORY:   Wanda Baker returns as scheduled. She completed cycle 2 adjuvant Xeloda beginning 02/17/2017. She denies nausea/vomiting. No mouth sores. No diarrhea. No hand or foot pain or redness.  Objective:  Vital signs in last 24 hours:  Blood pressure 138/68, pulse 81, temperature 97.9 F (36.6 C), temperature source Oral, resp. rate 18, height _0  (1.702 m), weight 145 lb (65.8 kg), SpO2 100 %.    HEENT: No thrush or ulcers. Resp: Lungs clear bilaterally. Cardio: Regular rate and rhythm. GI: Abdomen soft and nontender. No hepatomegaly. Left lower quadrant colostomy. Vascular: No leg edema.  Skin: Palms without erythema.    Lab Results:  Lab Results  Component Value Date   WBC 5.8 03/04/2017   HGB 12.7 03/04/2017   HCT 37.5 03/04/2017   MCV 93.3 03/04/2017   PLT 212 03/04/2017   NEUTROABS 3.9 03/04/2017    Imaging:  No results found.  Medications: I have reviewed the patient's current medications.  Assessment/Plan: 1. 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 Tumor noted at 15 cm on sigmoidoscopy 09/17/2016 ? CT abdomen/pelvis 09/16/2016-colon obstruction, mass at the "distal sigmoid colon " ? Elevated preoperative CEA ? MSI-stable, no loss of mismatch repair protein expression ? 0/22 lymph nodes positive, extracellular mucin without tumor on the inked serosal surface ? Initiation of Xeloda/radiation 11/21/2016; completion of Xeloda/radiation 12/31/2016 ? Cycle 1 adjuvant Xeloda 01/27/2017 ? Cycle 2 adjuvant Xeloda 02/17/2017 ? Cycle 3 adjuvant Xeloda 03/10/2017  2. Colonic obstruction secondary to #1  3. Iron deficiency anemia secondary to #1. Improved.     Disposition: Wanda Baker appears stable. She has completed 2 cycles of adjuvant  Xeloda. She is tolerating treatment well. Plan to proceed with cycle 3 beginning 03/10/2017. She will return for a follow-up visit in 3 weeks.    Ned Card ANP/GNP-BC   03/04/2017  10:26 AM

## 2017-03-04 NOTE — Telephone Encounter (Signed)
Gave avs and calendar for october °

## 2017-03-05 MED FILL — XELODA 500 MG TABLET: 500 | 21 days supply | Qty: 84 | Fill #0

## 2017-03-06 ENCOUNTER — Telehealth: Payer: Self-pay | Admitting: *Deleted

## 2017-03-06 NOTE — Telephone Encounter (Signed)
Call from pt asking if it is OK to take to flu shot while taking Xeloda. YES. She voiced understanding.

## 2017-03-11 ENCOUNTER — Ambulatory Visit (INDEPENDENT_AMBULATORY_CARE_PROVIDER_SITE_OTHER): Payer: Medicaid Other | Admitting: Internal Medicine

## 2017-03-11 ENCOUNTER — Encounter: Payer: Self-pay | Admitting: Internal Medicine

## 2017-03-11 ENCOUNTER — Encounter: Payer: Self-pay | Admitting: Gastroenterology

## 2017-03-11 ENCOUNTER — Ambulatory Visit (INDEPENDENT_AMBULATORY_CARE_PROVIDER_SITE_OTHER): Payer: Medicaid Other | Admitting: Gastroenterology

## 2017-03-11 VITALS — BP 135/80 | HR 108 | Temp 98.1°F | Wt 146.8 lb

## 2017-03-11 VITALS — BP 118/66 | HR 74 | Ht 67.0 in | Wt 146.0 lb

## 2017-03-11 DIAGNOSIS — E119 Type 2 diabetes mellitus without complications: Secondary | ICD-10-CM

## 2017-03-11 DIAGNOSIS — I10 Essential (primary) hypertension: Secondary | ICD-10-CM | POA: Diagnosis not present

## 2017-03-11 DIAGNOSIS — C20 Malignant neoplasm of rectum: Secondary | ICD-10-CM

## 2017-03-11 DIAGNOSIS — Z85038 Personal history of other malignant neoplasm of large intestine: Secondary | ICD-10-CM | POA: Diagnosis not present

## 2017-03-11 DIAGNOSIS — Z23 Encounter for immunization: Secondary | ICD-10-CM

## 2017-03-11 LAB — POCT GLYCOSYLATED HEMOGLOBIN (HGB A1C): Hemoglobin A1C: 4.9

## 2017-03-11 MED ORDER — NA SULFATE-K SULFATE-MG SULF 17.5-3.13-1.6 GM/177ML PO SOLN: 1.0000 | Freq: Once | ORAL | 0 refills | Status: AC

## 2017-03-11 NOTE — Progress Notes (Signed)
RISA AUMAN    [id number]    Sep 27, 1960  Primary Care Physician:Mikell, Jeani Sow, MD  Referring Physician: Tonette Bihari, [place], [place] [address]  Chief complaint:  Status post rectal cancer  HPI:  56 year old female diagnosed with rectal cancer in April 2018 status post low anterior resection and diverting colostomy. Patient had sigmoidoscopy on 09/17/2016 and could not be extended further due to obstructive mass at 15 cm. Patient completed 2 cycles of adjuvant chemotherapy and radiation. She is tolerating chemotherapy well with no significant symptoms .She would like to Colostomy reversed. Denies any complaints. Denies any nausea, vomiting, abdominal pain, melena or bright red blood through ostomy.    Outpatient Encounter Prescriptions as of 03/11/2017  Medication Sig  . capecitabine (XELODA) 500 MG tablet Take 3 tablets (1,500 mg total) by mouth 2 (two) times daily after a meal. Take for 14 days on, 7 days off. Repeat every 21 days.  . ferrous sulfate 325 (65 FE) MG tablet Take 1 tablet (325 mg total) by mouth 3 (three) times daily with meals.  . gabapentin (NEURONTIN) 300 MG capsule Take 1 capsule (300 mg total) by mouth 3 (three) times daily.  . montelukast (SINGULAIR) 10 MG tablet TAKE 1 TABLET BY MOUTH AT BEDTIME  . omeprazole (PRILOSEC) 20 MG capsule TAKE ONE CAPSULE BY MOUTH EVERY DAY  . prochlorperazine (COMPAZINE) 10 MG tablet Take 1 tablet (10 mg total) by mouth every 6 (six) hours as needed for nausea or vomiting.  . saccharomyces boulardii (FLORASTOR) 250 MG capsule Take 1 capsule (250 mg total) by mouth 2 (two) times daily.  . traMADol (ULTRAM) 50 MG tablet Take 2 tablets (100 mg total) by mouth every 6 (six) hours as needed for moderate pain.  [name] Kitchen triamcinolone (NASACORT AQ) 55 MCG/ACT nasal inhaler Place 2 sprays into the nose daily.   No facility-administered encounter medications on file as of 03/11/2017.     Allergies as  of 03/11/2017 - Review Complete 03/11/2017  Allergen Reaction Noted  . Prednisone    . Vicodin [hydrocodone-acetaminophen] Other (See Comments) 08/06/2010  . Penicillins Swelling and Rash 01/05/2009    Past Medical History:  Diagnosis Date  . Adenocarcinoma of colon ([facility]) 09/23/2016  . Allergy    uses Nasocort daily and takes Singulair nightly   . Arthritis   . Chronic back pain    buldging disc  . Diabetes mellitus without complication ([facility])    taikes Metformin daily  . GERD (gastroesophageal reflux disease)    takes Omeprazole daily   . History of bronchitis    many yrs ago  . Hyperlipidemia    takes Atorvastatin daily  . Hypertension    takes Lisinopril daily as well HCTZ  . Weakness    left arm    Past Surgical History:  Procedure Laterality Date  . ABDOMINAL HYSTERECTOMY  2011   Supracervical Hysterectomy  . ANTERIOR CERVICAL DECOMP/DISCECTOMY FUSION N/A 12/02/2013   Procedure: ANTERIOR CERVICAL DECOMPRESSION/DISCECTOMY FUSION 3 LEVELS;  Surgeon: Erline Levine, MD;  Location: [facility];  Service: Neurosurgery;  Laterality: N/A;  C4-5 C5-6 C6-7 Anterior cervical decompression/diskectomy/fusion  . BOWEL RESECTION N/A 09/18/2016   Procedure: LOW ANTERIOR COLON RESECTION WITH COLOSTOMY;  Surgeon: Fanny Skates, MD;  Location: [facility];  Service: General;  Laterality: N/A;  . CESAREAN SECTION  86/88  . FLEXIBLE SIGMOIDOSCOPY N/A 09/17/2016   Procedure: FLEXIBLE SIGMOIDOSCOPY;  Surgeon: Mauri Pole, MD;  Location: [facility];  Service: Endoscopy;  Laterality: N/A;  . TUBAL LIGATION      Family History  Problem Relation Age of Onset  . Heart disease Maternal Grandmother   . Diabetes Maternal Grandmother   . Hypertension Maternal Grandmother   . Heart disease Maternal Grandfather   . Heart disease Paternal Grandmother   . Diabetes Paternal Grandmother   . Alzheimer's disease Paternal Grandmother     Social History   Social History  . Marital status: Divorced     Spouse name: N/A  . Number of children: N/A  . Years of education: N/A   Occupational History  . Not on file.   Social History Main Topics  . Smoking status: Former Research scientist (life sciences)  . Smokeless tobacco: Never Used     Comment: quit smoking 50yrs ago  . Alcohol use No  . Drug use: No  . Sexual activity: No   Other Topics Concern  . Not on file   Social History Narrative   lives with adult sons and mother and father   divorced   81 YO son has autism   smoked 1/2 ppd - quit 8/01      Review of systems: Review of Systems  Constitutional: Negative for fever and chills.  HENT: Negative.   Eyes: Negative for blurred vision.  Respiratory: Negative for cough, shortness of breath and wheezing.   Cardiovascular: Negative for chest pain and palpitations.  Gastrointestinal: as per HPI Genitourinary: Negative for dysuria, urgency, frequency and hematuria.  Musculoskeletal: Negative for myalgias, back pain and joint pain.  Skin: Negative for itching and rash.  Neurological: Negative for dizziness, tremors, focal weakness, seizures and loss of consciousness.  Endo/Heme/Allergies: Positive for seasonal allergies.  Psychiatric/Behavioral: Negative for depression, suicidal ideas and hallucinations.  All other systems reviewed and are negative.   Physical Exam: Vitals:   03/11/17 1453  BP: 118/66  Pulse: 74   Body mass index is 22.87 kg/m. Gen:      No acute distress HEENT:  EOMI, sclera anicteric Neck:     No masses; no thyromegaly Lungs:    Clear to auscultation bilaterally; normal respiratory effort CV:         Regular rate and rhythm; no murmurs Abd:      + bowel sounds; soft, non-tender; no palpable masses, no distension, para stomal hernia, colostomy Ext:    No edema; adequate peripheral perfusion Skin:      Warm and dry; no rash Neuro: alert and oriented x 3 Psych: normal mood and affect  Data Reviewed:  Reviewed labs, radiology imaging, old records and pertinent past GI  work up   Assessment and Plan/Recommendations:  56 year old female with rectal cancer status post low anterior resection and diverting colostomy April 2018 due to obstructive rectal cancer. She received adjuvant chemoradiation. Patient is interested in getting the colostomy reversed. We will schedule for colonoscopy prior to colostomy reversal surgery as patient never had a full colonoscopy The risks and benefits as well as alternatives of endoscopic procedure(s) have been discussed and reviewed. All questions answered. The patient agrees to proceed.     Damaris Hippo , MD [phone] Mon-Fri 8a-5p [phone] after 5p, weekends, holidays  CC: Tonette Bihari, MD

## 2017-03-11 NOTE — Patient Instructions (Addendum)
I am so happy about how well your doing. Your blood sugars and your blood pressure so well controlled. I want you to follow up in 3 months. The main thing I want you to do is at some point try to get your eyes examined and try to get your mammogram in the future

## 2017-03-11 NOTE — Patient Instructions (Signed)
You have been scheduled for a colonoscopy. Please follow written instructions given to you at your visit today.  Please pick up your prep supplies at the pharmacy within the next 1-3 days. If you use inhalers (even only as needed), please bring them with you on the day of your procedure.   

## 2017-03-11 NOTE — Progress Notes (Signed)
   [facility] Kerrin Mo, MD Phone: [phone]  Reason For Visit: Follow up   # CHRONIC DM, Type 2: Diet-controlled type 2 diabetes. Patient has lost significant amount of weight following her diagnosis of adenocarcinoma of the colon. Denies polyuria, visual changes, numbness or tingling.  #CHRONIC HTN: Blood Pressure is again diet-controlled Denies CP, dyspnea, HA, edema, dizziness / lightheadedness  #Adenocarcinoma of the colon -Patient has successfully received both radiation and chemotherapy for her adenocarcinoma -She states that she has had very few side effects associated with this -Her mood is good.She denies any depression or SI -Patient is planning on getting her colectomy re-anastomosed in the next couple months  Past Medical History Reviewed problem list.  Medications- reviewed and updated No additions to family history Social history- patient is a non-smoker  Objective: BP 135/80 (BP Location: Right Arm, Patient Position: Sitting, Cuff Size: Normal)   Pulse (!) 108   Temp 98.1 F (36.7 C) (Oral)   Wt 146 lb 12.8 oz (66.6 kg)   SpO2 97%   BMI 22.99 kg/m  Gen: NAD, alert, cooperative with exam Cardio: regular rate and rhythm, S1S2 heard, no murmurs appreciated Pulm: clear to auscultation bilaterally, no wheezes, rhonchi or rales GI: soft, non-tender, non-distended, bowel sounds present, colectomy bag with brown stool, ostomy within normal limits    Assessment/Plan: See problem based a/p  Diabetes type 2, controlled (HCC) A1c 4.9 - Diet controlled  - Obtained a diabetic foot exam  - Needs an eye exam but will wait as a lot of appointments coming up  - Follow up in 3 months   HYPERTENSION, BENIGN ESSENTIAL Diet controlled  Follow up as needed   Rectal cancer [facility]) Doing well Following with oncology  Surgery to reanastomies colostomy

## 2017-03-12 NOTE — Assessment & Plan Note (Signed)
Diet controlled  Follow up as needed

## 2017-03-12 NOTE — Assessment & Plan Note (Addendum)
Doing well Following with oncology  Surgery to reanastomies colostomy

## 2017-03-12 NOTE — Assessment & Plan Note (Addendum)
A1c 4.9 - Diet controlled  - Obtained a diabetic foot exam  - Needs an eye exam but will wait as a lot of appointments coming up  - Follow up in 3 months

## 2017-03-25 ENCOUNTER — Telehealth: Payer: Self-pay | Admitting: Oncology

## 2017-03-25 ENCOUNTER — Other Ambulatory Visit (HOSPITAL_BASED_OUTPATIENT_CLINIC_OR_DEPARTMENT_OTHER): Payer: Medicaid Other

## 2017-03-25 ENCOUNTER — Ambulatory Visit (HOSPITAL_BASED_OUTPATIENT_CLINIC_OR_DEPARTMENT_OTHER): Payer: Medicaid Other | Admitting: Oncology

## 2017-03-25 VITALS — BP 134/85 | HR 81 | Temp 98.0°F | Resp 19 | Ht 67.0 in | Wt 146.2 lb

## 2017-03-25 DIAGNOSIS — C20 Malignant neoplasm of rectum: Secondary | ICD-10-CM | POA: Diagnosis present

## 2017-03-25 DIAGNOSIS — D630 Anemia in neoplastic disease: Secondary | ICD-10-CM

## 2017-03-25 LAB — CBC WITH DIFFERENTIAL/PLATELET
BASO%: 0.4 % (ref 0.0–2.0)
Basophils Absolute: 0 10*3/uL (ref 0.0–0.1)
EOS%: 2.2 % (ref 0.0–7.0)
Eosinophils Absolute: 0.2 10*3/uL (ref 0.0–0.5)
HCT: 38 % (ref 34.8–46.6)
HGB: 13.2 g/dL (ref 11.6–15.9)
LYMPH%: 14.7 % (ref 14.0–49.7)
MCH: 33.7 pg (ref 25.1–34.0)
MCHC: 34.6 g/dL (ref 31.5–36.0)
MCV: 97.6 fL (ref 79.5–101.0)
MONO#: 0.6 10*3/uL (ref 0.1–0.9)
MONO%: 8 % (ref 0.0–14.0)
NEUT#: 5.3 10*3/uL (ref 1.5–6.5)
NEUT%: 74.7 % (ref 38.4–76.8)
Platelets: 275 10*3/uL (ref 145–400)
RBC: 3.9 10*6/uL (ref 3.70–5.45)
RDW: 20.6 % — ABNORMAL HIGH (ref 11.2–14.5)
WBC: 7.1 10*3/uL (ref 3.9–10.3)
lymph#: 1 10*3/uL (ref 0.9–3.3)

## 2017-03-25 LAB — COMPREHENSIVE METABOLIC PANEL
ALT: 17 U/L (ref 0–55)
ANION GAP: 8 meq/L (ref 3–11)
AST: 18 U/L (ref 5–34)
Albumin: 3.8 g/dL (ref 3.5–5.0)
Alkaline Phosphatase: 84 U/L (ref 40–150)
BUN: 8 mg/dL (ref 7.0–26.0)
CALCIUM: 9.7 mg/dL (ref 8.4–10.4)
CHLORIDE: 106 meq/L (ref 98–109)
CO2: 26 meq/L (ref 22–29)
Creatinine: 0.7 mg/dL (ref 0.6–1.1)
EGFR: >90 mL/min/{1.73_m2} (ref >90)
Glucose: 143 mg/dl — ABNORMAL HIGH (ref 70–140)
POTASSIUM: 3.8 meq/L (ref 3.5–5.1)
Sodium: 140 mEq/L (ref 136–145)
Total Bilirubin: 0.57 mg/dL (ref 0.20–1.20)
Total Protein: 7 g/dL (ref 6.4–8.3)

## 2017-03-25 MED ORDER — CAPECITABINE 500 MG PO TABS: ORAL_TABLET | ORAL | 0 refills | Status: DC

## 2017-03-25 MED FILL — CAPECITABINE 500 MG TABLET: 500 | 14 days supply | Qty: 98 | Fill #0

## 2017-03-25 NOTE — Addendum Note (Signed)
Addended by: Rosalio Macadamia C on: 03/25/2017 10:03 AM   Modules accepted: Orders

## 2017-03-25 NOTE — Progress Notes (Signed)
  [place] OFFICE PROGRESS NOTE   Diagnosis: Rectal cancer  INTERVAL HISTORY:   Wanda Baker returns as scheduled. She began another cycle of adjuvant Xeloda on 03/10/2017. No mouth sores, diarrhea, or hand/foot pain. She feels well. No complaint. She is scheduled to see Dr. Dema Severin to consider colostomy reversal  Objective:  Vital signs in last 24 hours:  Blood pressure 134/85, pulse 81, temperature 98 F (36.7 C), temperature source Oral, resp. rate 19, height _0  (1.702 m), weight 146 lb 3.2 oz (66.3 kg), SpO2 100 %.    HEENT: No thrush or ulcers Resp: Lungs clear bilaterally Cardio: Regular rate and rhythm GI: No hepatosplenomegaly, nontender, left lower quadrant colostomy Vascular: No leg edema  Skin: Palms and soles without erythema or skin breakdown    Lab Results:  Lab Results  Component Value Date   WBC 7.1 03/25/2017   HGB 13.2 03/25/2017   HCT 38.0 03/25/2017   MCV 97.6 03/25/2017   PLT 275 03/25/2017   NEUTROABS 5.3 03/25/2017    CMP     Component Value Date/Time   NA 140 03/25/2017 0834   K 3.8 03/25/2017 0834   CL 102 09/25/2016 0611   CO2 26 03/25/2017 0834   GLUCOSE 143 (H) 03/25/2017 0834   BUN 8.0 03/25/2017 0834   CREATININE 0.7 03/25/2017 0834   CALCIUM 9.7 03/25/2017 0834   PROT 7.0 03/25/2017 0834   ALBUMIN 3.8 03/25/2017 0834   AST 18 03/25/2017 0834   ALT 17 03/25/2017 0834   ALKPHOS 84 03/25/2017 0834   BILITOT 0.57 03/25/2017 0834   GFRNONAA >60 09/25/2016 0611   GFRNONAA >89 09/21/2015 1502   GFRAA >60 09/25/2016 0611   GFRAA >89 09/21/2015 1502    Lab Results  Component Value Date   CEA1 <1.00 10/14/2016     Medications: I have reviewed the patient's current medications.  Assessment/Plan: 1. 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 Tumor noted at 15 cm on sigmoidoscopy  09/17/2016 ? CT abdomen/pelvis 09/16/2016-colon obstruction, mass at the "distal sigmoid colon " ? Elevated preoperative CEA ? MSI-stable, no loss of mismatch repair protein expression ? 0/22 lymph nodes positive, extracellular mucin without tumor on the inked serosal surface ? Initiation of Xeloda/radiation 11/21/2016; completion of Xeloda/radiation 12/31/2016 ? Cycle 1 adjuvant Xeloda 01/27/2017 ? Cycle 2 adjuvant Xeloda 02/17/2017 ? Cycle 3 adjuvant Xeloda 03/10/2017 ? Cycle 4 adjuvant Xeloda 03/31/2017 (Xeloda dose escalated by one tablet daily)   2. Colonic obstruction secondary to #1  3. Iron deficiency anemia secondary to #1. Improved.    Disposition:  Wanda Baker appears well. She has experienced no apparent toxicity from the adjuvant Xeloda. The plan is to proceed with cycle 4 adjuvant Xeloda 03/31/2017. We will increase the Xeloda by 500 mg daily. She will return for an office visit on 04/15/2017. The plan is to complete 5 cycles of adjuvant Xeloda.  She will see Dr. Dema Severin to discuss colostomy reversal.  Donneta Romberg, MD  03/25/2017  9:31 AM

## 2017-03-25 NOTE — Telephone Encounter (Signed)
Scheduled appt per 10/9 los - Gave patient AVS and calender per los.  

## 2017-03-31 ENCOUNTER — Ambulatory Visit: Payer: Self-pay | Admitting: Oncology

## 2017-03-31 ENCOUNTER — Other Ambulatory Visit: Payer: Self-pay

## 2017-04-01 ENCOUNTER — Ambulatory Visit: Payer: Self-pay | Admitting: Oncology

## 2017-04-01 ENCOUNTER — Other Ambulatory Visit: Payer: Self-pay

## 2017-04-04 ENCOUNTER — Other Ambulatory Visit: Payer: Self-pay

## 2017-04-04 ENCOUNTER — Ambulatory Visit: Payer: Self-pay | Admitting: Oncology

## 2017-04-07 ENCOUNTER — Other Ambulatory Visit: Payer: Self-pay | Admitting: Surgery

## 2017-04-07 ENCOUNTER — Other Ambulatory Visit: Payer: Self-pay | Admitting: Internal Medicine

## 2017-04-07 DIAGNOSIS — Z1231 Encounter for screening mammogram for malignant neoplasm of breast: Secondary | ICD-10-CM

## 2017-04-07 DIAGNOSIS — C20 Malignant neoplasm of rectum: Secondary | ICD-10-CM | POA: Diagnosis not present

## 2017-04-08 ENCOUNTER — Other Ambulatory Visit: Payer: Self-pay | Admitting: Oncology

## 2017-04-08 ENCOUNTER — Other Ambulatory Visit: Payer: Self-pay | Admitting: Surgery

## 2017-04-08 DIAGNOSIS — C20 Malignant neoplasm of rectum: Secondary | ICD-10-CM

## 2017-04-10 ENCOUNTER — Ambulatory Visit
Admission: RE | Admit: 2017-04-10 | Discharge: 2017-04-10 | Disposition: A | Discharge status: Home / Self Care | Payer: Medicaid Other | Source: Ambulatory Visit | Attending: Family Medicine | Admitting: Family Medicine

## 2017-04-10 DIAGNOSIS — Z1231 Encounter for screening mammogram for malignant neoplasm of breast: Secondary | ICD-10-CM | POA: Diagnosis not present

## 2017-04-14 ENCOUNTER — Telehealth: Payer: Self-pay | Admitting: Gastroenterology

## 2017-04-14 NOTE — Telephone Encounter (Signed)
Patient has been on her probiotic for a time. She was concerned about it being a fiber. Advised it is not a fiber.

## 2017-04-15 ENCOUNTER — Other Ambulatory Visit (HOSPITAL_BASED_OUTPATIENT_CLINIC_OR_DEPARTMENT_OTHER): Payer: Medicaid Other

## 2017-04-15 ENCOUNTER — Telehealth: Payer: Self-pay | Admitting: Oncology

## 2017-04-15 ENCOUNTER — Ambulatory Visit (HOSPITAL_BASED_OUTPATIENT_CLINIC_OR_DEPARTMENT_OTHER): Payer: Medicaid Other | Admitting: Nurse Practitioner

## 2017-04-15 VITALS — BP 142/72 | HR 89 | Temp 98.0°F | Resp 18 | Ht 67.0 in | Wt 147.3 lb

## 2017-04-15 DIAGNOSIS — C189 Malignant neoplasm of colon, unspecified: Secondary | ICD-10-CM

## 2017-04-15 DIAGNOSIS — D630 Anemia in neoplastic disease: Secondary | ICD-10-CM | POA: Diagnosis not present

## 2017-04-15 DIAGNOSIS — C20 Malignant neoplasm of rectum: Secondary | ICD-10-CM | POA: Diagnosis present

## 2017-04-15 LAB — CBC WITH DIFFERENTIAL/PLATELET
BASO%: 0.6 % (ref 0.0–2.0)
Basophils Absolute: 0 10*3/uL (ref 0.0–0.1)
EOS ABS: 0.1 10*3/uL (ref 0.0–0.5)
EOS%: 2.5 % (ref 0.0–7.0)
HEMATOCRIT: 37.1 % (ref 34.8–46.6)
HEMOGLOBIN: 13.1 g/dL (ref 11.6–15.9)
LYMPH%: 20.9 % (ref 14.0–49.7)
MCH: 34.3 pg — ABNORMAL HIGH (ref 25.1–34.0)
MCHC: 35.3 g/dL (ref 31.5–36.0)
MCV: 97.2 fL (ref 79.5–101.0)
MONO#: 0.4 10*3/uL (ref 0.1–0.9)
MONO%: 7.2 % (ref 0.0–14.0)
NEUT%: 68.8 % (ref 38.4–76.8)
NEUTROS ABS: 3.9 10*3/uL (ref 1.5–6.5)
PLATELETS: 309 10*3/uL (ref 145–400)
RBC: 3.82 10*6/uL (ref 3.70–5.45)
RDW: 19.2 % — AB (ref 11.2–14.5)
WBC: 5.7 10*3/uL (ref 3.9–10.3)
lymph#: 1.2 10*3/uL (ref 0.9–3.3)

## 2017-04-15 LAB — CEA (IN HOUSE-CHCC): CEA (CHCC-IN HOUSE): 1.39 ng/mL (ref 0.00–5.00)

## 2017-04-15 MED ORDER — CAPECITABINE 500 MG PO TABS: ORAL_TABLET | ORAL | 0 refills | Status: DC

## 2017-04-15 MED FILL — CAPECITABINE 500 MG TABLET: 500 | 21 days supply | Qty: 98 | Fill #0

## 2017-04-15 NOTE — Progress Notes (Signed)
  [place] OFFICE PROGRESS NOTE   Diagnosis:  Rectal cancer  INTERVAL HISTORY:   Wanda Baker returns as scheduled. She completed cycle 4 adjuvant Xeloda beginning 03/31/2017. She denies nausea/vomiting. No mouth sores. She had minor diarrhea which resolved following Imodium. No hand or foot pain or redness.  Objective:  Vital signs in last 24 hours:  Blood pressure (!) 142/72, pulse 89, temperature 98 F (36.7 C), temperature source Oral, resp. rate 18, height '5\' 7"'$  (1.702 m), weight 147 lb 4.8 oz (66.8 kg), SpO2 100 %.    HEENT: no thrush or ulcers. Resp: lungs clear bilaterally. Cardio: regular rate and rhythm. GI: abdomen soft and nontender. No hepatomegaly. Left lower quadrant colostomy. Vascular: no leg edema.  Skin: Palms without erythema.    Lab Results:  Lab Results  Component Value Date   WBC 5.7 04/15/2017   HGB 13.1 04/15/2017   HCT 37.1 04/15/2017   MCV 97.2 04/15/2017   PLT 309 04/15/2017   NEUTROABS 3.9 04/15/2017    Imaging:  No results found.  Medications: I have reviewed the patient's current medications.  Assessment/Plan: 1. 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 Tumor noted at 15 cm on sigmoidoscopy 09/17/2016 ? CT abdomen/pelvis 09/16/2016-colon obstruction, mass at the "distal sigmoid colon " ? Elevated preoperative CEA ? MSI-stable, no loss of mismatch repair protein expression ? 0/22 lymph nodes positive, extracellular mucin without tumor on the inked serosal surface ? Initiation of Xeloda/radiation 11/21/2016; completion of Xeloda/radiation 12/31/2016 ? Cycle 1 adjuvant Xeloda 01/27/2017 ? Cycle 2 adjuvant Xeloda 02/17/2017 ? Cycle 3 adjuvant Xeloda 03/10/2017 ? Cycle 4 adjuvant Xeloda 03/31/2017 (Xeloda dose escalated by one tablet daily)  ? Cycle 5 adjuvant Xeloda 04/21/2017  2. Colonic  obstruction secondary to #1  3. Iron deficiency anemia secondary to #1. Improved.     Disposition: Wanda Baker appears stable. She has completed 4 cycles of adjuvant Xeloda. Plan to proceed with the fifth and final cycle beginning 04/21/2017. She will return for a follow-up visit in one month.    Ned Card ANP/GNP-BC   04/15/2017  9:52 AM

## 2017-04-15 NOTE — Telephone Encounter (Signed)
Scheduled appt per 10//30 los - Gave patient AVS and calender per los.  

## 2017-04-16 ENCOUNTER — Ambulatory Visit
Admission: RE | Admit: 2017-04-16 | Discharge: 2017-04-16 | Disposition: A | Discharge status: Home / Self Care | Payer: Medicaid Other | Source: Ambulatory Visit | Attending: Surgery | Admitting: Surgery

## 2017-04-16 ENCOUNTER — Other Ambulatory Visit: Payer: Self-pay | Admitting: *Deleted

## 2017-04-16 DIAGNOSIS — C20 Malignant neoplasm of rectum: Secondary | ICD-10-CM

## 2017-04-18 ENCOUNTER — Encounter: Payer: Self-pay | Admitting: Gastroenterology

## 2017-04-18 ENCOUNTER — Ambulatory Visit (AMBULATORY_SURGERY_CENTER): Payer: Medicaid Other | Admitting: Gastroenterology

## 2017-04-18 VITALS — BP 125/70 | HR 83 | Temp 98.7°F | Resp 22 | Ht 67.0 in | Wt 147.0 lb

## 2017-04-18 DIAGNOSIS — D122 Benign neoplasm of ascending colon: Secondary | ICD-10-CM

## 2017-04-18 DIAGNOSIS — Z85048 Personal history of other malignant neoplasm of rectum, rectosigmoid junction, and anus: Secondary | ICD-10-CM | POA: Diagnosis not present

## 2017-04-18 MED ORDER — SODIUM CHLORIDE 0.9 % IV SOLN: 500.0000 mL | INTRAVENOUS | Status: DC

## 2017-04-18 NOTE — Patient Instructions (Signed)
YOU HAD AN ENDOSCOPIC PROCEDURE TODAY AT [facility]:   Refer to the procedure report that was given to you for any specific questions about what was found during the examination.  If the procedure report does not answer your questions, please call your gastroenterologist to clarify.  If you requested that your care partner not be given the details of your procedure findings, then the procedure report has been included in a sealed envelope for you to review at your convenience later.  YOU SHOULD EXPECT: Some feelings of bloating in the abdomen. Passage of more gas than usual.  Walking can help get rid of the air that was put into your GI tract during the procedure and reduce the bloating. If you had a lower endoscopy (such as a colonoscopy or flexible sigmoidoscopy) you may notice spotting of blood in your stool or on the toilet paper. If you underwent a bowel prep for your procedure, you may not have a normal bowel movement for a few days.  Please Note:  You might notice some irritation and congestion in your nose or some drainage.  This is from the oxygen used during your procedure.  There is no need for concern and it should clear up in a day or so.  SYMPTOMS TO REPORT IMMEDIATELY:   Following lower endoscopy (colonoscopy or flexible sigmoidoscopy):  Excessive amounts of blood in the stool  Significant tenderness or worsening of abdominal pains  Swelling of the abdomen that is new, acute  Fever of 100F or higher   For urgent or emergent issues, a gastroenterologist can be reached at any hour by calling [phone].   DIET:  We do recommend a small meal at first, but then you may proceed to your regular diet.  Drink plenty of fluids but you should avoid alcoholic beverages for 24 hours.  ACTIVITY:  You should plan to take it easy for the rest of today and you should NOT DRIVE or use heavy machinery until tomorrow (because of the sedation medicines used during the test).     FOLLOW UP: Our staff will call the number listed on your records the next business day following your procedure to check on you and address any questions or concerns that you may have regarding the information given to you following your procedure. If we do not reach you, we will leave a message.  However, if you are feeling well and you are not experiencing any problems, there is no need to return our call.  We will assume that you have returned to your regular daily activities without incident.  If any biopsies were taken you will be contacted by phone or by letter within the next 1-3 weeks.  Please call us at [phone] if you have not heard about the biopsies in 3 weeks.    SIGNATURES/CONFIDENTIALITY: You and/or your care partner have signed paperwork which will be entered into your electronic medical record.  These signatures attest to the fact that that the information above on your After Visit Summary has been reviewed and is understood.  Full responsibility of the confidentiality of this discharge information lies with you and/or your care-partner  Polyp information given.  Recall colonoscopy 1 year.

## 2017-04-18 NOTE — Progress Notes (Signed)
Report to PACU, RN, vss, BBS= Clear.  

## 2017-04-18 NOTE — Op Note (Signed)
[facility] Patient Name: Wanda Baker Procedure Date: 04/18/2017 10:30 AM MRN: [id number] Endoscopist: Mauri Pole , MD Age: 56 Referring MD:  Date of Birth: 09-Mar-1961 Gender: Female Account #: [id number] Procedure:                Colonoscopy Indications:              High risk colon cancer surveillance: Personal                            history of colon cancer Medicines:                Monitored Anesthesia Care Procedure:                Pre-Anesthesia Assessment:                           - Prior to the procedure, a History and Physical                            was performed, and patient medications and                            allergies were reviewed. The patient's tolerance of                            previous anesthesia was also reviewed. The risks                            and benefits of the procedure and the sedation                            options and risks were discussed with the patient.                            All questions were answered, and informed consent                            was obtained. Prior Anticoagulants: The patient has                            taken no previous anticoagulant or antiplatelet                            agents. ASA Grade Assessment: II - A patient with                            mild systemic disease. After reviewing the risks                            and benefits, the patient was deemed in                            satisfactory condition to undergo the procedure.  After obtaining informed consent, the colonoscope                            was passed under direct vision. Throughout the                            procedure, the patient's blood pressure, pulse, and                            oxygen saturations were monitored continuously. The                            Model PCF-[id number]) scope was introduced                            through the sigmoid colostomy and  advanced to the                            the cecum, identified by appendiceal orifice and                            ileocecal valve. The colonoscopy was performed                            without difficulty. The patient tolerated the                            procedure well. The quality of the bowel                            preparation was excellent. The ileocecal valve,                            appendiceal orifice, and rectum were photographed. Scope In: 10:40:24 AM Scope Out: 10:54:01 AM Scope Withdrawal Time: 0 hours 10 minutes 46 seconds  Total Procedure Duration: 0 hours 13 minutes 37 seconds  Findings:                 The perianal and digital rectal examinations were                            normal.                           A 18 mm polyp was found in the ascending colon. The                            polyp was sessile. The polyp was removed with a                            piecemeal technique using a cold snare. Resection                            and retrieval were complete.  The exam was otherwise without abnormality.                           The perianal and digital rectal examinations were                            normal. Rectum was unprepped, mild proctitis                            secondary to diversion. Retroflexion was not                            performed Complications:            No immediate complications. Estimated Blood Loss:     Estimated blood loss was minimal. Impression:               - One 18 mm polyp in the ascending colon, removed                            piecemeal using a cold snare. Resected and                            retrieved.                           - The examination was otherwise normal. Recommendation:           - Patient has a contact number available for                            emergencies. The signs and symptoms of potential                            delayed complications were discussed with  the                            patient. Return to normal activities tomorrow.                            Written discharge instructions were provided to the                            patient.                           - Resume previous diet.                           - Continue present medications.                           - Await pathology results.                           - Repeat colonoscopy in 1 year for surveillance  based on pathology results. Mauri Pole, MD 04/18/2017 10:59:06 AM This report has been signed electronically.

## 2017-04-18 NOTE — Progress Notes (Signed)
Called to room to assist during endoscopic procedure.  Patient ID and intended procedure confirmed with present staff. Received instructions for my participation in the procedure from the performing physician.  

## 2017-04-18 NOTE — Progress Notes (Signed)
Patient's brother - Baldo Daub, [phone], will transport the patient home.

## 2017-04-21 ENCOUNTER — Telehealth: Payer: Self-pay

## 2017-04-21 NOTE — Telephone Encounter (Signed)

## 2017-04-25 ENCOUNTER — Encounter: Payer: Self-pay | Admitting: Gastroenterology

## 2017-05-13 ENCOUNTER — Telehealth: Payer: Self-pay | Admitting: Oncology

## 2017-05-13 ENCOUNTER — Other Ambulatory Visit (HOSPITAL_BASED_OUTPATIENT_CLINIC_OR_DEPARTMENT_OTHER): Payer: Medicaid Other

## 2017-05-13 ENCOUNTER — Ambulatory Visit (HOSPITAL_BASED_OUTPATIENT_CLINIC_OR_DEPARTMENT_OTHER): Payer: Medicaid Other | Admitting: Oncology

## 2017-05-13 VITALS — BP 153/74 | HR 88 | Temp 97.9°F | Resp 18 | Wt 151.5 lb

## 2017-05-13 DIAGNOSIS — C20 Malignant neoplasm of rectum: Secondary | ICD-10-CM

## 2017-05-13 LAB — CBC WITH DIFFERENTIAL/PLATELET
BASO%: 0.5 % (ref 0.0–2.0)
BASOS ABS: 0 10*3/uL (ref 0.0–0.1)
EOS ABS: 0.1 10*3/uL (ref 0.0–0.5)
EOS%: 2.2 % (ref 0.0–7.0)
HEMATOCRIT: 38.8 % (ref 34.8–46.6)
HEMOGLOBIN: 13.2 g/dL (ref 11.6–15.9)
LYMPH%: 21.6 % (ref 14.0–49.7)
MCH: 34 pg (ref 25.1–34.0)
MCHC: 34.2 g/dL (ref 31.5–36.0)
MCV: 99.4 fL (ref 79.5–101.0)
MONO#: 0.5 10*3/uL (ref 0.1–0.9)
MONO%: 10.3 % (ref 0.0–14.0)
NEUT#: 2.9 10*3/uL (ref 1.5–6.5)
NEUT%: 65.4 % (ref 38.4–76.8)
Platelets: 261 10*3/uL (ref 145–400)
RBC: 3.9 10*6/uL (ref 3.70–5.45)
RDW: 20.5 % — AB (ref 11.2–14.5)
WBC: 4.5 10*3/uL (ref 3.9–10.3)
lymph#: 1 10*3/uL (ref 0.9–3.3)

## 2017-05-13 LAB — CEA (IN HOUSE-CHCC): CEA (CHCC-In House): 1.46 ng/mL (ref 0.00–5.00)

## 2017-05-13 NOTE — Progress Notes (Addendum)
  [place] OFFICE PROGRESS NOTE   Diagnosis: Rectal cancer  INTERVAL HISTORY:   Wanda Baker returns as scheduled.  She completed a final cycle of Xeloda beginning 04/21/2017.  No mouth sores.  She reports mild diarrhea following chemotherapy.  She feels well at present.  She is being followed by Drs. Dalbert Batman and White to plan the ostomy reversal.  She underwent a colonoscopy 04/18/2017.  A polyp was removed from the ascending colon.  The pathology revealed a tubular adenoma.  Objective:  Vital signs in last 24 hours:  Blood pressure (!) 153/74, pulse 88, temperature 97.9 F (36.6 C), temperature source Oral, resp. rate 18, weight 151 lb 8 oz (68.7 kg), SpO2 99 %.    HEENT: No thrush or ulcers Lymphatics: No cervical, supraclavicular, or inguinal nodes.?  1 cm soft mobile high medial left axillary node Resp: Lungs clear bilaterally Cardio: Regular rate and rhythm GI: No hepatomegaly, no mass, nontender, left lower quadrant colostomy Vascular: No leg edema  Skin: Palms without erythema    Lab Results:  Lab Results  Component Value Date   WBC 4.5 05/13/2017   HGB 13.2 05/13/2017   HCT 38.8 05/13/2017   MCV 99.4 05/13/2017   PLT 261 05/13/2017   NEUTROABS 2.9 05/13/2017    CMP     Component Value Date/Time   NA 140 03/25/2017 0834   K 3.8 03/25/2017 0834   CL 102 09/25/2016 0611   CO2 26 03/25/2017 0834   GLUCOSE 143 (H) 03/25/2017 0834   BUN 8.0 03/25/2017 0834   CREATININE 0.7 03/25/2017 0834   CALCIUM 9.7 03/25/2017 0834   PROT 7.0 03/25/2017 0834   ALBUMIN 3.8 03/25/2017 0834   AST 18 03/25/2017 0834   ALT 17 03/25/2017 0834   ALKPHOS 84 03/25/2017 0834   BILITOT 0.57 03/25/2017 0834   GFRNONAA >60 09/25/2016 0611   GFRNONAA >89 09/21/2015 1502   GFRAA >60 09/25/2016 0611   GFRAA >89 09/21/2015 1502    Lab Results  Component Value Date   CEA1 1.39 04/15/2017     Medications: I have reviewed the patient's current  medications.  Assessment/Plan: 1. 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 Tumor noted at 15 cm on sigmoidoscopy 09/17/2016 ? CT abdomen/pelvis 09/16/2016-colon obstruction, mass at the "distal sigmoid colon " ? Elevated preoperative CEA ? MSI-stable, no loss of mismatch repair protein expression ? 0/22 lymph nodes positive, extracellular mucin without tumor on the inked serosal surface ? Initiation of Xeloda/radiation 11/21/2016; completion of Xeloda/radiation 12/31/2016 ? Cycle 1 adjuvant Xeloda 01/27/2017 ? Cycle 2 adjuvant Xeloda 02/17/2017 ? Cycle 3 adjuvant Xeloda 03/10/2017 ? Cycle 4 adjuvant Xeloda 03/31/2017 (Xeloda dose escalated by one tablet daily)  ? Cycle 5 adjuvant Xeloda 04/21/2017 ? Colonoscopy 04/18/2017-tubular adenoma removed from the ascending colon  2. Colonic obstruction secondary to #1  3. Iron deficiency anemia secondary to #1.  Resolved   Disposition:  [name] has completed the course of adjuvant chemotherapy.  She will be scheduled for colostomy reversal within the next few months.  She will return for an office visit and CEA in 6 months.  15 minutes were spent with the patient today.  The majority of the time was used for counseling and coordination of care.  [name], MD  05/13/2017  10:18 AM

## 2017-05-13 NOTE — Telephone Encounter (Signed)
Gave avs and calendar for May 2019 °

## 2017-06-05 ENCOUNTER — Ambulatory Visit: Payer: Medicaid Other | Admitting: Internal Medicine

## 2017-06-05 VITALS — BP 118/80 | HR 95 | Temp 98.3°F | Wt 153.4 lb

## 2017-06-05 DIAGNOSIS — C20 Malignant neoplasm of rectum: Secondary | ICD-10-CM | POA: Diagnosis not present

## 2017-06-05 DIAGNOSIS — E119 Type 2 diabetes mellitus without complications: Secondary | ICD-10-CM

## 2017-06-05 DIAGNOSIS — I10 Essential (primary) hypertension: Secondary | ICD-10-CM

## 2017-06-05 LAB — POCT GLYCOSYLATED HEMOGLOBIN (HGB A1C): HEMOGLOBIN A1C: 5.2

## 2017-06-05 NOTE — Progress Notes (Signed)
   [facility] Kerrin Mo, MD Phone: [phone]  Reason For Visit: Follow up   Denies any complaints today  # Colorectal Cancer  Patient is doing well she recently had her last colonoscopy which showed no significant cancer. She underwent a colonoscopy 04/18/2017.  A polyp was removed from the ascending colon.  The pathology revealed a tubular adenoma. She is finished up her chemotherapy and radiation.  She is looking to follow-up with surgery in January to discuss reversal of her ostomy.  # CHRONIC DM, Type 2: Diet-controlled type 2 diabetes  A1c today 5.2, no issues with her vision.  Does not plan on following up with ophthalmology anytime soon. Denies polyuria, visual changes, numbness or tingling.  #Hypertension, Well controlled on no medications  Past Medical History Reviewed problem list.  Medications- reviewed and updated No additions to family history Social history- patient is a non smoker  Objective: There were no vitals taken for this visit. Gen: NAD, alert, cooperative with exam Cardio: regular rate and rhythm, S1S2 heard, no murmurs appreciated Pulm: clear to auscultation bilaterally, no wheezes, rhonchi or rales GI: soft, non-tender, non-distended, bowel sounds present, ostomy in place no signs of infection Extremities: warm, well perfused, No edema, cyanosis or clubbing;    Assessment/Plan: See problem based a/p  Rectal cancer [facility]) Patient has completed her treatments for colorectal cancer.  Resections of colon cancer completes. last colonoscopy with a tubular adenoma no signs of cancer Follow-up in January with surgery for ostomy reversal Follow-up with oncology  HYPERTENSION, BENIGN ESSENTIAL Well-controlled on no medications  Diabetes type 2, controlled (HCC) A1c 5.2 No symptoms

## 2017-06-05 NOTE — Assessment & Plan Note (Addendum)
A1c 5.2 No symptoms Does not want to get an eye exam at this point

## 2017-06-05 NOTE — Patient Instructions (Signed)
It was lovely to see you. Your blood pressure and blood sugar looks great. Please follow up with me in June. Have a wonderful christmas

## 2017-06-05 NOTE — Assessment & Plan Note (Signed)
Patient has completed her treatments for colorectal cancer.  Resections of colon cancer completes. last colonoscopy with a tubular adenoma no signs of cancer Follow-up in January with surgery for ostomy reversal Follow-up with oncology

## 2017-06-05 NOTE — Assessment & Plan Note (Signed)
Well-controlled on no medications

## 2017-06-25 ENCOUNTER — Ambulatory Visit: Payer: Self-pay | Admitting: Surgery

## 2017-06-25 NOTE — H&P (Signed)
History of Present Illness Wanda Baker M. Wanda Pangle Baker; 06/25/2017 10:22 AM) Patient words: CC: F/u - discuss Hartmann's reversal Wanda Baker is a pleasant 66F with hx of HTN & DM now resolved with diet/exercise presents for evaluation of Hartmann's reversal.  She has a history of obstructing rectal cancer - mass found at 15cm on urgent endoscopy - then s/p exlap/LAR with diverting colostomy by Dr. Dalbert Batman 09/18/2016. Intraoperatively a mass was found just beneath the peritoneal reflection - stapling 2-3cm below the mass and noted to be near the pelvic floor. 2 #1 prolene sutures were placed on the rectal stump. No sign of metastatic cancer at that operation. 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 CT A/P 09/16/16 showed no metastatic disease in the abdomen. Subsequent CXR was normal. She has subsequently been treated with Xeloda + XRT. She completed adjuvant chemo 05/04/17. She denies any complaints today. Stoma working well. Scant mucoid discharge per rectum.  GGE 04/16/17 - normal water-soluble contrast enema-rectal stump appears to be somewhere around the mid rectum Colonoscopy 04/18/17 - Dr. Silverio Decamp - 41m polyp in the ascending colon that was sessile and removed piecemeal.  Resection and retrieval were complete.  Pathology returned tubular adenoma without high-grade dysplasia  PMH: HTN/DM now resolved  PSH: C-sx x2; laparoscopic assisted hysterectomy for fibroids 2011; 2015 - neck surgery - c-spine fusion, DJD.  Social: Denies use of tobacco/etoh/drugs  FHx: Denies FHx of malignancy.  The patient is a 57year old female.   Allergies (Wanda Baker 06/25/2017 9:46 AM) Vicodin *ANALGESICS - OPIOID*   PredniSONE *CORTICOSTEROIDS*   Penicillins   Allergies Reconciled    Medication History (Wanda Baker 06/25/2017 9:46 AM) Xeloda  (500MG Tablet, Oral) Active. Gabapentin  (300MG Capsule, Oral) Active. Singulair  (10MG Tablet, Oral) Active. PriLOSEC  (20MG  Capsule DR, Oral) Active. TraMADol HCl  (50MG Tablet, Oral) Active. Florastor  (250MG Capsule, Oral) Active. Probiotic  (Oral) Active. Ferrous Sulfate  (325 (65 Fe)MG Tablet, Oral) Active. Medications Reconciled     Review of Systems (Wanda Baker; 06/25/2017 10:22 AM) General Not Present- Appetite Loss, Chills, Fatigue, Fever, Night Sweats, Weight Gain and Weight Loss. Skin Not Present- Change in Wart/Mole, Dryness, Hives, Jaundice, New Lesions, Non-Healing Wounds, Rash and Ulcer. HEENT Present- Seasonal Allergies and Wears glasses/contact lenses. Not Present- Earache, Hearing Loss, Hoarseness, Nose Bleed, Oral Ulcers, Ringing in the Ears, Sinus Pain, Sore Throat, Visual Disturbances and Yellow Eyes. Respiratory Not Present- Bloody sputum, Chronic Cough, Difficulty Breathing, Snoring and Wheezing. Breast Not Present- Breast Mass, Breast Pain, Nipple Discharge and Skin Changes. Cardiovascular Not Present- Chest Pain, Difficulty Breathing Lying Down, Leg Cramps, Palpitations, Rapid Heart Rate, Shortness of Breath and Swelling of Extremities. Female Genitourinary Not Present- Frequency, Nocturia, Painful Urination, Pelvic Pain and Urgency. Musculoskeletal Not Present- Back Pain, Joint Pain, Joint Stiffness, Muscle Pain, Muscle Weakness and Swelling of Extremities. Neurological Not Present- Decreased Memory, Fainting, Headaches, Numbness, Seizures, Tingling, Tremor, Trouble walking and Weakness. Psychiatric Not Present- Anxiety, Bipolar, Change in Sleep Pattern, Depression, Fearful and Frequent crying. Hematology Not Present- Blood Thinners, Easy Bruising, Excessive bleeding, Gland problems, HIV and Persistent Infections.  Vitals (Wanda A. Brown RMA; 06/25/2017 9:46 AM) 06/25/2017 9:46 AM Weight: 154.6 lb   Height: 67 in  Body Surface Area: 1.81 m   Body Mass Index: 24.21 kg/m   Temp.: 98.2 F    Pulse: 102 (Regular)    BP: 142/82 (Sitting, Left Arm, Standard)  Physical  Exam Wanda Baker M. Wanda Hemmer Baker; 06/25/2017 10:23 AM) The physical exam findings are as follows: Note: Constitutional: No acute distress; conversant; no deformities Eyes: Moist conjunctiva; no lid lag; anicteric sclerae; pupils equal round and reactive to light Neck: Trachea midline; no palpable thyromegaly Lungs: Normal respiratory effort; no tactile fremitus CV: Regular rate and rhythm; no palpable thrill; no pitting edema GI: Abdomen soft, nontender, nondistended; no palpable hepatosplenomegaly; left lower quadrant colostomy with associated and reducible parastomal hernia-stool in bag, colostomy pink Anorectal: Good anal tone. No palpable masses. The distal portion of the rectal stump was not palpable on exam. MSK: Normal gait; no clubbing/cyanosis Psychiatric: Appropriate affect; alert and oriented 3 Lymphatic: No palpable cervical or axillary lymphadenopathy **A chaperone, Wanda Baker, was present for the entire physical exam    Assessment & Plan Wanda Baker M. Wanda Suthers Baker; 06/25/2017 10:25 AM) RECTUM CANCER (C20) Impression: Wanda Baker is a very pleasant 33yoF with hx of obstructing proximal rectal cancer status post Hartman's procedure for/2018 by Dr. Dalbert Batman. She then underwent adjuvant chemoradiation which she completed 05/04/17 and tolerated well. Subsequent Gastrografin enema as well as colonoscopy has since been completed. She is now here to discuss stoma reversal. -We will schedule her for exploratory laparotomy, Hartmann takedown with cystoscopy/stents by urology given history of rectal cancer resection and chemotherapy/radiation. Abx/miralax bowel prep -The anatomy and physiology of the GI tract was discussed at length with the patient. The pathophysiology of rectal cancer was again discussed at length with associated pictures and illustrations. -The planned procedure, material risks (including, but not limited to, pain, bleeding, infection, scarring, need for blood transfusion, damage  to surrounding structures- blood vessels/nerves/viscus/organs, damage to ureter, urine leak, leak from anastomosis, need for additional procedures, need for stoma which may be permanent, hernia, recurrence, pneumonia, heart attack, stroke, death) benefits and alternatives to surgery were discussed at length. I noted a good probability that the procedure would help improve their symptoms. The patient's questions were answered to their satisfaction and they elected to proceed with surgery.  Signed electronically by Ileana Roup, Baker (06/25/2017 10:26 AM)

## 2017-06-26 ENCOUNTER — Other Ambulatory Visit: Payer: Self-pay | Admitting: Urology

## 2017-08-01 NOTE — Progress Notes (Signed)
09-17-16 (Epic) EKG, CXR

## 2017-08-01 NOTE — Patient Instructions (Addendum)
Wanda Baker  08/01/2017   Your procedure is scheduled on: 08-08-17   Report to [facility] Main  Entrance Follow signs to Short Stay on first floor at 530 AM  Call this number if you have problems the morning of surgery [phone]    Remember: DRINK 2 PRESURGERY ENSURE DRINKS THE NIGHT BEFORE SURGERY AT  1000 PM AND 1 PRESURGERY DRINK THE DAY OF THE PROCEDURE 3 HOURS PRIOR TO SCHEDULED SURGERY. NO SOLIDS AFTER MIDNIGHT THE DAY PRIOR TO THE SURGERY. NOTHING BY MOUTH EXCEPT CLEAR LIQUIDS UNTIL THREE HOURS PRIOR TO SCHEDULED SURGERY. PLEASE FINISH PRESURGERY ENSURE DRINK PER SURGEON ORDER 3 HOURS PRIOR TO SCHEDULED SURGERY TIME WHICH NEEDS TO BE COMPLETED AT 4:30AM.    CLEAR LIQUID DIET   Foods Allowed                                                                     Foods Excluded  Coffee and tea, regular and decaf                             liquids that you cannot  Plain Jell-O in any flavor                                             see through such as: Fruit ices (not with fruit pulp)                                     milk, soups, orange juice  Iced Popsicles                                    All solid food Carbonated beverages, regular and diet                                    Cranberry, grape and apple juices Sports drinks like Gatorade Lightly seasoned clear broth or consume(fat free) Sugar, honey syrup  Sample Menu Breakfast                                Lunch                                     Supper Cranberry juice                    Beef broth                            Chicken broth Jell-O  Grape juice                           Apple juice Coffee or tea                        Jell-O                                      Popsicle                                                Coffee or tea                        Coffee or  tea  _____________________________________________________________________    Take these medicines the morning of surgery with A SIP OF WATER: Gabapentin (Neurontin)                               You may not have any metal on your body including hair pins and              piercings  Do not wear jewelry, make-up, lotions, powders or perfumes, deodorant             Do not wear nail polish.  Do not shave  48 hours prior to surgery.                Do not bring valuables to the hospital. Wanda Baker.  Contacts, dentures or bridgework may not be worn into surgery.  Leave suitcase in the car. After surgery it may be brought to your room.   Follow your md's instructions regarding the drink             Please read over the following fact sheets you were given: _____________________________________________________________________             [facility] - Preparing for Surgery Before surgery, you can play an important role.  Because skin is not sterile, your skin needs to be as free of germs as possible.  You can reduce the number of germs on your skin by washing with CHG (chlorahexidine gluconate) soap before surgery.  CHG is an antiseptic cleaner which kills germs and bonds with the skin to continue killing germs even after washing. Please DO NOT use if you have an allergy to CHG or antibacterial soaps.  If your skin becomes reddened/irritated stop using the CHG and inform your nurse when you arrive at Short Stay. Do not shave (including legs and underarms) for at least 48 hours prior to the first CHG shower.  You may shave your face/neck. Please follow these instructions carefully:  1.  Shower with CHG Soap the night before surgery and the  morning of Surgery.  2.  If you choose to wash your hair, wash your hair first as usual with your  normal  shampoo.  3.  After you shampoo, rinse your hair and body thoroughly to remove the  shampoo.  4.  Use CHG as you would any other liquid soap.  You can apply chg directly  to the skin and wash                       Gently with a scrungie or clean washcloth.  5.  Apply the CHG Soap to your body ONLY FROM THE NECK DOWN.   Do not use on face/ open                           Wound or open sores. Avoid contact with eyes, ears mouth and genitals (private parts).                       Wash face,  Genitals (private parts) with your normal soap.             6.  Wash thoroughly, paying special attention to the area where your surgery  will be performed.  7.  Thoroughly rinse your body with warm water from the neck down.  8.  DO NOT shower/wash with your normal soap after using and rinsing off  the CHG Soap.                9.  Pat yourself dry with a clean towel.            10.  Wear clean pajamas.            11.  Place clean sheets on your bed the night of your first shower and do not  sleep with pets. Day of Surgery : Do not apply any lotions/deodorants the morning of surgery.  Please wear clean clothes to the hospital/surgery center.  FAILURE TO FOLLOW THESE INSTRUCTIONS MAY RESULT IN THE CANCELLATION OF YOUR SURGERY PATIENT SIGNATURE_________________________________  NURSE SIGNATURE__________________________________  ________________________________________________________________________   Wanda Baker  An incentive spirometer is a tool that can help keep your lungs clear and active. This tool measures how well you are filling your lungs with each breath. Taking long deep breaths may help reverse or decrease the chance of developing breathing (pulmonary) problems (especially infection) following:  A long period of time when you are unable to move or be active. BEFORE THE PROCEDURE   If the spirometer includes an indicator to show your best effort, your nurse or respiratory therapist will set it to a desired goal.  If possible, sit up straight or lean slightly  forward. Try not to slouch.  Hold the incentive spirometer in an upright position. INSTRUCTIONS FOR USE  1. Sit on the edge of your bed if possible, or sit up as far as you can in bed or on a chair. 2. Hold the incentive spirometer in an upright position. 3. Breathe out normally. 4. Place the mouthpiece in your mouth and seal your lips tightly around it. 5. Breathe in slowly and as deeply as possible, raising the piston or the ball toward the top of the column. 6. Hold your breath for 3-5 seconds or for as long as possible. Allow the piston or ball to fall to the bottom of the column. 7. Remove the mouthpiece from your mouth and breathe out normally. 8. Rest for a few seconds and repeat Steps 1 through 7 at least 10 times every 1-2 hours when you are awake. Take your time and take a few normal breaths between deep breaths. 9. The spirometer may include an indicator to show  your best effort. Use the indicator as a goal to work toward during each repetition. 10. After each set of 10 deep breaths, practice coughing to be sure your lungs are clear. If you have an incision (the cut made at the time of surgery), support your incision when coughing by placing a pillow or rolled up towels firmly against it. Once you are able to get out of bed, walk around indoors and cough well. You may stop using the incentive spirometer when instructed by your caregiver.  RISKS AND COMPLICATIONS  Take your time so you do not get dizzy or light-headed.  If you are in pain, you may need to take or ask for pain medication before doing incentive spirometry. It is harder to take a deep breath if you are having pain. AFTER USE  Rest and breathe slowly and easily.  It can be helpful to keep track of a log of your progress. Your caregiver can provide you with a simple table to help with this. If you are using the spirometer at home, follow these instructions: [place] IF:   You are having difficultly using the  spirometer.  You have trouble using the spirometer as often as instructed.  Your pain medication is not giving enough relief while using the spirometer.  You develop fever of 100.5 F (38.1 C) or higher. SEEK IMMEDIATE MEDICAL CARE IF:   You cough up bloody sputum that had not been present before.  You develop fever of 102 F (38.9 C) or greater.  You develop worsening pain at or near the incision site. MAKE SURE YOU:   Understand these instructions.  Will watch your condition.  Will get help right away if you are not doing well or get worse. Document Released: 10/14/2006 Document Revised: 08/26/2011 Document Reviewed: 12/15/2006 Exit[employer] Patient Information 2014 ExitCare, [place].   ________________________________________________________________________  WHAT IS A BLOOD TRANSFUSION? Blood Transfusion Information  A transfusion is the replacement of blood or some of its parts. Blood is made up of multiple cells which provide different functions.  Red blood cells carry oxygen and are used for blood loss replacement.  White blood cells fight against infection.  Platelets control bleeding.  Plasma helps clot blood.  Other blood products are available for specialized needs, such as hemophilia or other clotting disorders. BEFORE THE TRANSFUSION  Who gives blood for transfusions?   Healthy volunteers who are fully evaluated to make sure their blood is safe. This is blood bank blood. Transfusion therapy is the safest it has ever been in the practice of medicine. Before blood is taken from a donor, a complete history is taken to make sure that person has no history of diseases nor engages in risky social behavior (examples are intravenous drug use or sexual activity with multiple partners). The donor's travel history is screened to minimize risk of transmitting infections, such as malaria. The donated blood is tested for signs of infectious diseases, such as HIV and hepatitis.  The blood is then tested to be sure it is compatible with you in order to minimize the chance of a transfusion reaction. If you or a relative donates blood, this is often done in anticipation of surgery and is not appropriate for emergency situations. It takes many days to process the donated blood. RISKS AND COMPLICATIONS Although transfusion therapy is very safe and saves many lives, the main dangers of transfusion include:   Getting an infectious disease.  Developing a transfusion reaction. This is an allergic reaction to  something in the blood you were given. Every precaution is taken to prevent this. The decision to have a blood transfusion has been considered carefully by your caregiver before blood is given. Blood is not given unless the benefits outweigh the risks. AFTER THE TRANSFUSION  Right after receiving a blood transfusion, you will usually feel much better and more energetic. This is especially true if your red blood cells have gotten low (anemic). The transfusion raises the level of the red blood cells which carry oxygen, and this usually causes an energy increase.  The nurse administering the transfusion will monitor you carefully for complications. HOME CARE INSTRUCTIONS  No special instructions are needed after a transfusion. You may find your energy is better. Speak with your caregiver about any limitations on activity for underlying diseases you may have. SEEK MEDICAL CARE IF:   Your condition is not improving after your transfusion.  You develop redness or irritation at the intravenous (IV) site. SEEK IMMEDIATE MEDICAL CARE IF:  Any of the following symptoms occur over the next 12 hours:  Shaking chills.  You have a temperature by mouth above 102 F (38.9 C), not controlled by medicine.  Chest, back, or muscle pain.  People around you feel you are not acting correctly or are confused.  Shortness of breath or difficulty breathing.  Dizziness and fainting.  You  get a rash or develop hives.  You have a decrease in urine output.  Your urine turns a dark color or changes to pink, red, or brown. Any of the following symptoms occur over the next 10 days:  You have a temperature by mouth above 102 F (38.9 C), not controlled by medicine.  Shortness of breath.  Weakness after normal activity.  The white part of the eye turns yellow (jaundice).  You have a decrease in the amount of urine or are urinating less often.  Your urine turns a dark color or changes to pink, red, or brown. Document Released: 05/31/2000 Document Revised: 08/26/2011 Document Reviewed: 01/18/2008 [facility] Patient Information 2014 [place], [place].  _______________________________________________________________________

## 2017-08-04 ENCOUNTER — Encounter (HOSPITAL_COMMUNITY)
Admission: RE | Admit: 2017-08-04 | Discharge: 2017-08-04 | Disposition: A | Discharge status: Home / Self Care | Payer: Medicaid Other | Source: Ambulatory Visit | Attending: Surgery | Admitting: Surgery

## 2017-08-04 ENCOUNTER — Encounter (HOSPITAL_COMMUNITY): Payer: Self-pay

## 2017-08-04 DIAGNOSIS — C20 Malignant neoplasm of rectum: Secondary | ICD-10-CM | POA: Insufficient documentation

## 2017-08-04 DIAGNOSIS — Z0183 Encounter for blood typing: Secondary | ICD-10-CM | POA: Insufficient documentation

## 2017-08-04 DIAGNOSIS — Z01812 Encounter for preprocedural laboratory examination: Secondary | ICD-10-CM | POA: Insufficient documentation

## 2017-08-04 LAB — CBC WITH DIFFERENTIAL/PLATELET
Basophils Absolute: 0 10*3/uL (ref 0.0–0.1)
Basophils Relative: 0 %
Eosinophils Absolute: 0.1 10*3/uL (ref 0.0–0.7)
Eosinophils Relative: 2 %
HEMATOCRIT: 42.3 % (ref 36.0–46.0)
HEMOGLOBIN: 14.5 g/dL (ref 12.0–15.0)
LYMPHS ABS: 1.5 10*3/uL (ref 0.7–4.0)
LYMPHS PCT: 25 %
MCH: 29.6 pg (ref 26.0–34.0)
MCHC: 34.3 g/dL (ref 30.0–36.0)
MCV: 86.3 fL (ref 78.0–100.0)
MONOS PCT: 7 %
Monocytes Absolute: 0.4 10*3/uL (ref 0.1–1.0)
NEUTROS ABS: 3.8 10*3/uL (ref 1.7–7.7)
NEUTROS PCT: 66 %
Platelets: 273 10*3/uL (ref 150–400)
RBC: 4.9 MIL/uL (ref 3.87–5.11)
RDW: 12.8 % (ref 11.5–15.5)
WBC: 5.8 10*3/uL (ref 4.0–10.5)

## 2017-08-04 LAB — COMPREHENSIVE METABOLIC PANEL
ALBUMIN: 4.1 g/dL (ref 3.5–5.0)
ALK PHOS: 83 U/L (ref 38–126)
ALT: 32 U/L (ref 14–54)
AST: 29 U/L (ref 15–41)
Anion gap: 10 (ref 5–15)
BILIRUBIN TOTAL: 0.5 mg/dL (ref 0.3–1.2)
BUN: 7 mg/dL (ref 6–20)
CALCIUM: 9.5 mg/dL (ref 8.9–10.3)
CO2: 23 mmol/L (ref 22–32)
Chloride: 105 mmol/L (ref 101–111)
Creatinine, Ser: 0.53 mg/dL (ref 0.44–1.00)
GLUCOSE: 106 mg/dL — AB (ref 65–99)
Potassium: 4.1 mmol/L (ref 3.5–5.1)
Sodium: 138 mmol/L (ref 135–145)
TOTAL PROTEIN: 7.3 g/dL (ref 6.5–8.1)

## 2017-08-04 LAB — HEMOGLOBIN A1C
Hgb A1c MFr Bld: 5.8 % — ABNORMAL HIGH (ref 4.8–5.6)
Mean Plasma Glucose: 119.76 mg/dL

## 2017-08-04 LAB — ABO/RH: ABO/RH(D): O POS

## 2017-08-04 LAB — PROTIME-INR
INR: 0.96
PROTHROMBIN TIME: 12.6 s (ref 11.4–15.2)

## 2017-08-04 LAB — APTT: aPTT: 25 seconds (ref 24–36)

## 2017-08-04 NOTE — Consult Note (Signed)
[place] Nurse ostomy consult note  [place] Nurse requested for preoperative stoma (ileostomy) site marking by Dr. Dema Severin. She has an existing LLQ colostomy from April 2018. She is hopeful for reanastomosis.  Examined patient sitting and standing in order to place the marking in the patient's visual field, away from any creases or abdominal contour issues and within the rectus muscle.  Attempted to mark below the patient's belt line.   Marked for ileostomy in the RUQ  7.5cm to the right of the umbilicus and  6.5YY above the umbilicus.  Patient's abdomen cleansed with CHG wipes at site markings, allowed to air dry prior to marking.Covered mark with thin film transparent dressing to preserve mark until date of surgery, 08/08/17.   [place] nursing team will not follow, but will remain available to this patient, the nursing, surgical and medical teams.   Please re-consult if an ostomy is created intraoperatively. Thanks, Maudie Flakes, MSN, RN, [place], Arther Abbott  Pager# [phone]

## 2017-08-06 ENCOUNTER — Telehealth: Payer: Self-pay

## 2017-08-06 NOTE — Telephone Encounter (Signed)
LVM for pt to inform that we received information regarding colostomy reversal. Information sent to Dr. Benay Spice

## 2017-08-07 MED ORDER — CLINDAMYCIN PHOSPHATE 900 MG/50ML IV SOLN
900.0000 mg | INTRAVENOUS | Status: AC
Start: 2017-08-08 — End: 2017-08-08
  Administered 2017-08-08 (×2): 900 mg via INTRAVENOUS
  Filled 2017-08-07: qty 50

## 2017-08-07 MED ORDER — GENTAMICIN SULFATE 40 MG/ML IJ SOLN
360.0000 mg | INTRAVENOUS | Status: AC
  Administered 2017-08-08: 360 mg via INTRAVENOUS
  Filled 2017-08-07: qty 9

## 2017-08-08 ENCOUNTER — Inpatient Hospital Stay (HOSPITAL_COMMUNITY): Payer: Medicaid Other | Admitting: Anesthesiology

## 2017-08-08 ENCOUNTER — Encounter (HOSPITAL_COMMUNITY)
Admission: RE | Disposition: A | Discharge status: Home / Self Care | Payer: Self-pay | Source: Ambulatory Visit | Attending: Surgery

## 2017-08-08 ENCOUNTER — Inpatient Hospital Stay (HOSPITAL_COMMUNITY)
Admission: RE | Admit: 2017-08-08 | Discharge: 2017-08-15 | DRG: 330 | Disposition: A | Discharge status: Home / Self Care | Payer: Medicaid Other | Source: Ambulatory Visit | Attending: Surgery | Admitting: Surgery

## 2017-08-08 ENCOUNTER — Inpatient Hospital Stay (HOSPITAL_COMMUNITY): Payer: Medicaid Other

## 2017-08-08 ENCOUNTER — Encounter (HOSPITAL_COMMUNITY): Payer: Self-pay | Admitting: *Deleted

## 2017-08-08 DIAGNOSIS — K624 Stenosis of anus and rectum: Secondary | ICD-10-CM

## 2017-08-08 DIAGNOSIS — Z960 Presence of urogenital implants: Secondary | ICD-10-CM | POA: Diagnosis present

## 2017-08-08 DIAGNOSIS — M1990 Unspecified osteoarthritis, unspecified site: Secondary | ICD-10-CM | POA: Diagnosis present

## 2017-08-08 DIAGNOSIS — E119 Type 2 diabetes mellitus without complications: Secondary | ICD-10-CM

## 2017-08-08 DIAGNOSIS — G709 Myoneural disorder, unspecified: Secondary | ICD-10-CM | POA: Diagnosis present

## 2017-08-08 DIAGNOSIS — E669 Obesity, unspecified: Secondary | ICD-10-CM | POA: Diagnosis present

## 2017-08-08 DIAGNOSIS — G8929 Other chronic pain: Secondary | ICD-10-CM | POA: Diagnosis present

## 2017-08-08 DIAGNOSIS — Z85048 Personal history of other malignant neoplasm of rectum, rectosigmoid junction, and anus: Secondary | ICD-10-CM

## 2017-08-08 DIAGNOSIS — K219 Gastro-esophageal reflux disease without esophagitis: Secondary | ICD-10-CM | POA: Diagnosis present

## 2017-08-08 DIAGNOSIS — Z6826 Body mass index (BMI) 26.0-26.9, adult: Secondary | ICD-10-CM

## 2017-08-08 DIAGNOSIS — Z433 Encounter for attention to colostomy: Principal | ICD-10-CM

## 2017-08-08 DIAGNOSIS — Z885 Allergy status to narcotic agent status: Secondary | ICD-10-CM

## 2017-08-08 DIAGNOSIS — E785 Hyperlipidemia, unspecified: Secondary | ICD-10-CM | POA: Diagnosis present

## 2017-08-08 DIAGNOSIS — Z886 Allergy status to analgesic agent status: Secondary | ICD-10-CM

## 2017-08-08 DIAGNOSIS — T888XXA Other specified complications of surgical and medical care, not elsewhere classified, initial encounter: Secondary | ICD-10-CM | POA: Diagnosis present

## 2017-08-08 DIAGNOSIS — Z79891 Long term (current) use of opiate analgesic: Secondary | ICD-10-CM

## 2017-08-08 DIAGNOSIS — Z9889 Other specified postprocedural states: Secondary | ICD-10-CM | POA: Diagnosis not present

## 2017-08-08 DIAGNOSIS — D62 Acute posthemorrhagic anemia: Secondary | ICD-10-CM | POA: Diagnosis not present

## 2017-08-08 DIAGNOSIS — Z87891 Personal history of nicotine dependence: Secondary | ICD-10-CM

## 2017-08-08 DIAGNOSIS — E876 Hypokalemia: Secondary | ICD-10-CM | POA: Diagnosis present

## 2017-08-08 DIAGNOSIS — K435 Parastomal hernia without obstruction or  gangrene: Secondary | ICD-10-CM | POA: Diagnosis present

## 2017-08-08 DIAGNOSIS — K567 Ileus, unspecified: Secondary | ICD-10-CM | POA: Diagnosis not present

## 2017-08-08 DIAGNOSIS — Z9221 Personal history of antineoplastic chemotherapy: Secondary | ICD-10-CM

## 2017-08-08 DIAGNOSIS — Z923 Personal history of irradiation: Secondary | ICD-10-CM

## 2017-08-08 DIAGNOSIS — J9811 Atelectasis: Secondary | ICD-10-CM | POA: Diagnosis not present

## 2017-08-08 DIAGNOSIS — D638 Anemia in other chronic diseases classified elsewhere: Secondary | ICD-10-CM | POA: Diagnosis present

## 2017-08-08 DIAGNOSIS — Z888 Allergy status to other drugs, medicaments and biological substances status: Secondary | ICD-10-CM

## 2017-08-08 DIAGNOSIS — R000 Tachycardia, unspecified: Secondary | ICD-10-CM | POA: Diagnosis not present

## 2017-08-08 DIAGNOSIS — S36409A Unspecified injury of unspecified part of small intestine, initial encounter: Secondary | ICD-10-CM | POA: Diagnosis present

## 2017-08-08 DIAGNOSIS — R310 Gross hematuria: Secondary | ICD-10-CM | POA: Diagnosis present

## 2017-08-08 DIAGNOSIS — Z90711 Acquired absence of uterus with remaining cervical stump: Secondary | ICD-10-CM

## 2017-08-08 DIAGNOSIS — C20 Malignant neoplasm of rectum: Secondary | ICD-10-CM | POA: Diagnosis not present

## 2017-08-08 DIAGNOSIS — Z980 Intestinal bypass and anastomosis status: Secondary | ICD-10-CM

## 2017-08-08 DIAGNOSIS — E860 Dehydration: Secondary | ICD-10-CM | POA: Diagnosis not present

## 2017-08-08 DIAGNOSIS — Z79899 Other long term (current) drug therapy: Secondary | ICD-10-CM

## 2017-08-08 DIAGNOSIS — I10 Essential (primary) hypertension: Secondary | ICD-10-CM | POA: Diagnosis present

## 2017-08-08 DIAGNOSIS — J90 Pleural effusion, not elsewhere classified: Secondary | ICD-10-CM | POA: Diagnosis not present

## 2017-08-08 DIAGNOSIS — M549 Dorsalgia, unspecified: Secondary | ICD-10-CM | POA: Diagnosis present

## 2017-08-08 DIAGNOSIS — Y842 Radiological procedure and radiotherapy as the cause of abnormal reaction of the patient, or of later complication, without mention of misadventure at the time of the procedure: Secondary | ICD-10-CM | POA: Diagnosis present

## 2017-08-08 DIAGNOSIS — R531 Weakness: Secondary | ICD-10-CM

## 2017-08-08 DIAGNOSIS — Z880 Allergy status to penicillin: Secondary | ICD-10-CM

## 2017-08-08 DIAGNOSIS — K660 Peritoneal adhesions (postprocedural) (postinfection): Secondary | ICD-10-CM | POA: Diagnosis present

## 2017-08-08 DIAGNOSIS — Z932 Ileostomy status: Secondary | ICD-10-CM

## 2017-08-08 DIAGNOSIS — Z981 Arthrodesis status: Secondary | ICD-10-CM

## 2017-08-08 HISTORY — PX: COLOSTOMY TAKEDOWN: SHX5783

## 2017-08-08 HISTORY — PX: BOWEL RESECTION: SHX1257

## 2017-08-08 HISTORY — PX: LYSIS OF ADHESION: SHX5961

## 2017-08-08 HISTORY — PX: FLEXIBLE SIGMOIDOSCOPY: SHX5431

## 2017-08-08 HISTORY — DX: Stenosis of anus and rectum: K62.4

## 2017-08-08 HISTORY — PX: ILEOSTOMY: SHX1783

## 2017-08-08 HISTORY — PX: CYSTOSCOPY W/ URETERAL STENT PLACEMENT: SHX1429

## 2017-08-08 HISTORY — PX: LAPAROTOMY: SHX154

## 2017-08-08 LAB — TYPE AND SCREEN
ABO/RH(D): O POS
Antibody Screen: NEGATIVE

## 2017-08-08 LAB — CBC
HCT: 41 % (ref 36.0–46.0)
Hemoglobin: 13.3 g/dL (ref 12.0–15.0)
MCH: 28.5 pg (ref 26.0–34.0)
MCHC: 32.4 g/dL (ref 30.0–36.0)
MCV: 88 fL (ref 78.0–100.0)
PLATELETS: 320 10*3/uL (ref 150–400)
RBC: 4.66 MIL/uL (ref 3.87–5.11)
RDW: 12.9 % (ref 11.5–15.5)
WBC: 6.1 10*3/uL (ref 4.0–10.5)

## 2017-08-08 LAB — GLUCOSE, CAPILLARY
GLUCOSE-CAPILLARY: 127 mg/dL — AB (ref 65–99)
Glucose-Capillary: 134 mg/dL — ABNORMAL HIGH (ref 65–99)

## 2017-08-08 LAB — BASIC METABOLIC PANEL
Anion gap: 14 (ref 5–15)
BUN: 7 mg/dL (ref 6–20)
CHLORIDE: 104 mmol/L (ref 101–111)
CO2: 23 mmol/L (ref 22–32)
CREATININE: 0.82 mg/dL (ref 0.44–1.00)
Calcium: 8.2 mg/dL — ABNORMAL LOW (ref 8.9–10.3)
GFR calc non Af Amer: >60 mL/min (ref >60)
Glucose, Bld: 169 mg/dL — ABNORMAL HIGH (ref 65–99)
POTASSIUM: 4.7 mmol/L (ref 3.5–5.1)
SODIUM: 141 mmol/L (ref 135–145)

## 2017-08-08 SURGERY — LAPAROTOMY, EXPLORATORY
Anesthesia: General | Site: Rectum

## 2017-08-08 MED ORDER — POLYETHYLENE GLYCOL 3350 17 GM/SCOOP PO POWD
1.0000 | Freq: Once | ORAL | Status: DC
  Filled 2017-08-08: qty 255

## 2017-08-08 MED ORDER — INSULIN ASPART 100 UNIT/ML ~~LOC~~ SOLN
0.0000 [IU] | SUBCUTANEOUS | Status: DC
  Administered 2017-08-09 – 2017-08-15 (×6): 2 [IU] via SUBCUTANEOUS

## 2017-08-08 MED ORDER — MEPERIDINE HCL 50 MG/ML IJ SOLN: 6.2500 mg | INTRAMUSCULAR | Status: DC | PRN

## 2017-08-08 MED ORDER — HEPARIN SODIUM (PORCINE) 5000 UNIT/ML IJ SOLN
5000.0000 [IU] | Freq: Three times a day (TID) | INTRAMUSCULAR | Status: DC
  Administered 2017-08-09 – 2017-08-12 (×8): 5000 [IU] via SUBCUTANEOUS
  Filled 2017-08-08 (×8): qty 1

## 2017-08-08 MED ORDER — SUCCINYLCHOLINE CHLORIDE 200 MG/10ML IV SOSY
PREFILLED_SYRINGE | INTRAVENOUS | Status: AC
Start: 2017-08-08 — End: 2017-08-08
  Filled 2017-08-08: qty 10

## 2017-08-08 MED ORDER — LACTATED RINGERS IV SOLN
INTRAVENOUS | Status: DC
  Administered 2017-08-08 – 2017-08-12 (×8): via INTRAVENOUS

## 2017-08-08 MED ORDER — STERILE WATER FOR IRRIGATION IR SOLN
Status: DC | PRN
  Administered 2017-08-08: 1500 mL

## 2017-08-08 MED ORDER — DIPHENHYDRAMINE HCL 12.5 MG/5ML PO ELIX: 12.5000 mg | ORAL_SOLUTION | Freq: Four times a day (QID) | ORAL | Status: DC | PRN

## 2017-08-08 MED ORDER — LACTATED RINGERS IV SOLN
INTRAVENOUS | Status: DC | PRN
  Administered 2017-08-08 (×2): via INTRAVENOUS

## 2017-08-08 MED ORDER — FENTANYL CITRATE (PF) 100 MCG/2ML IJ SOLN
INTRAMUSCULAR | Status: AC
  Filled 2017-08-08: qty 2

## 2017-08-08 MED ORDER — METOPROLOL TARTRATE 5 MG/5ML IV SOLN
5.0000 mg | Freq: Four times a day (QID) | INTRAVENOUS | Status: DC | PRN
  Filled 2017-08-08: qty 5

## 2017-08-08 MED ORDER — PHENYLEPHRINE HCL 10 MG/ML IJ SOLN
INTRAMUSCULAR | Status: DC | PRN
  Administered 2017-08-08: 40 ug/min via INTRAVENOUS

## 2017-08-08 MED ORDER — ONDANSETRON HCL 4 MG/2ML IJ SOLN
INTRAMUSCULAR | Status: AC
  Filled 2017-08-08: qty 2

## 2017-08-08 MED ORDER — SUGAMMADEX SODIUM 200 MG/2ML IV SOLN
INTRAVENOUS | Status: DC | PRN
  Administered 2017-08-08: 150 mg via INTRAVENOUS

## 2017-08-08 MED ORDER — TRAMADOL HCL 50 MG PO TABS
50.0000 mg | ORAL_TABLET | Freq: Four times a day (QID) | ORAL | Status: DC | PRN
  Administered 2017-08-10 – 2017-08-12 (×3): 50 mg via ORAL
  Filled 2017-08-08 (×3): qty 1

## 2017-08-08 MED ORDER — FENTANYL CITRATE (PF) 100 MCG/2ML IJ SOLN
INTRAMUSCULAR | Status: DC | PRN
  Administered 2017-08-08 (×4): 50 ug via INTRAVENOUS
  Administered 2017-08-08: 100 ug via INTRAVENOUS
  Administered 2017-08-08 (×3): 50 ug via INTRAVENOUS

## 2017-08-08 MED ORDER — KETAMINE HCL 10 MG/ML IJ SOLN
INTRAMUSCULAR | Status: DC | PRN
  Administered 2017-08-08: 30 mg via INTRAVENOUS

## 2017-08-08 MED ORDER — KETOROLAC TROMETHAMINE 15 MG/ML IJ SOLN
15.0000 mg | Freq: Four times a day (QID) | INTRAMUSCULAR | Status: DC
  Administered 2017-08-09 – 2017-08-10 (×5): 15 mg via INTRAVENOUS
  Filled 2017-08-08 (×4): qty 1

## 2017-08-08 MED ORDER — ALVIMOPAN 12 MG PO CAPS
12.0000 mg | ORAL_CAPSULE | ORAL | Status: AC
  Administered 2017-08-08: 12 mg via ORAL
  Filled 2017-08-08: qty 1

## 2017-08-08 MED ORDER — DIPHENHYDRAMINE HCL 50 MG/ML IJ SOLN: 12.5000 mg | Freq: Four times a day (QID) | INTRAMUSCULAR | Status: DC | PRN

## 2017-08-08 MED ORDER — FENTANYL CITRATE (PF) 100 MCG/2ML IJ SOLN
INTRAMUSCULAR | Status: AC
  Administered 2017-08-08: 50 ug via INTRAVENOUS
  Filled 2017-08-08: qty 2

## 2017-08-08 MED ORDER — LACTATED RINGERS IV SOLN: INTRAVENOUS | Status: DC

## 2017-08-08 MED ORDER — METRONIDAZOLE 500 MG PO TABS
1000.0000 mg | ORAL_TABLET | ORAL | Status: DC
  Filled 2017-08-08: qty 2

## 2017-08-08 MED ORDER — SUGAMMADEX SODIUM 200 MG/2ML IV SOLN
INTRAVENOUS | Status: AC
  Filled 2017-08-08: qty 2

## 2017-08-08 MED ORDER — ALUM & MAG HYDROXIDE-SIMETH 200-200-20 MG/5ML PO SUSP
30.0000 mL | Freq: Four times a day (QID) | ORAL | Status: DC | PRN
Start: 2017-08-08 — End: 2017-08-15

## 2017-08-08 MED ORDER — LIDOCAINE 2% (20 MG/ML) 5 ML SYRINGE
INTRAMUSCULAR | Status: AC
  Filled 2017-08-08: qty 5

## 2017-08-08 MED ORDER — SUCCINYLCHOLINE CHLORIDE 200 MG/10ML IV SOSY
PREFILLED_SYRINGE | INTRAVENOUS | Status: DC | PRN
  Administered 2017-08-08: 120 mg via INTRAVENOUS

## 2017-08-08 MED ORDER — NEOMYCIN SULFATE 500 MG PO TABS
1000.0000 mg | ORAL_TABLET | ORAL | Status: DC
  Filled 2017-08-08: qty 2

## 2017-08-08 MED ORDER — PROPOFOL 10 MG/ML IV BOLUS
INTRAVENOUS | Status: AC
Start: 2017-08-08 — End: 2017-08-08
  Filled 2017-08-08: qty 20

## 2017-08-08 MED ORDER — ALBUMIN HUMAN 25 % IV SOLN
INTRAVENOUS | Status: DC | PRN
  Administered 2017-08-08 (×2): via INTRAVENOUS

## 2017-08-08 MED ORDER — CHLORHEXIDINE GLUCONATE CLOTH 2 % EX PADS: 6.0000 | MEDICATED_PAD | Freq: Once | CUTANEOUS | Status: DC

## 2017-08-08 MED ORDER — ALVIMOPAN 12 MG PO CAPS
12.0000 mg | ORAL_CAPSULE | Freq: Two times a day (BID) | ORAL | Status: DC
  Administered 2017-08-09 – 2017-08-11 (×5): 12 mg via ORAL
  Filled 2017-08-08 (×5): qty 1

## 2017-08-08 MED ORDER — LABETALOL HCL 5 MG/ML IV SOLN
INTRAVENOUS | Status: AC
  Administered 2017-08-08: 5 mg via INTRAVENOUS
  Filled 2017-08-08: qty 4

## 2017-08-08 MED ORDER — LIDOCAINE 2% (20 MG/ML) 5 ML SYRINGE
INTRAMUSCULAR | Status: AC
  Filled 2017-08-08: qty 15

## 2017-08-08 MED ORDER — 0.9 % SODIUM CHLORIDE (POUR BTL) OPTIME
TOPICAL | Status: DC | PRN
  Administered 2017-08-08: 3000 mL

## 2017-08-08 MED ORDER — PROMETHAZINE HCL 25 MG/ML IJ SOLN
INTRAMUSCULAR | Status: AC
  Administered 2017-08-08: 6.25 mg via INTRAVENOUS
  Filled 2017-08-08: qty 1

## 2017-08-08 MED ORDER — PROPOFOL 10 MG/ML IV BOLUS
INTRAVENOUS | Status: DC | PRN
  Administered 2017-08-08: 150 mg via INTRAVENOUS

## 2017-08-08 MED ORDER — FENTANYL CITRATE (PF) 100 MCG/2ML IJ SOLN
INTRAMUSCULAR | Status: AC
  Administered 2017-08-08: 25 ug via INTRAVENOUS
  Filled 2017-08-08: qty 2

## 2017-08-08 MED ORDER — LACTATED RINGERS IV BOLUS (SEPSIS)
1000.0000 mL | Freq: Once | INTRAVENOUS | Status: AC
  Administered 2017-08-09: 1000 mL via INTRAVENOUS

## 2017-08-08 MED ORDER — MIDAZOLAM HCL 2 MG/2ML IJ SOLN
INTRAMUSCULAR | Status: AC
  Filled 2017-08-08: qty 2

## 2017-08-08 MED ORDER — LIDOCAINE 2% (20 MG/ML) 5 ML SYRINGE
INTRAMUSCULAR | Status: DC | PRN
  Administered 2017-08-08: 1.5 mg/kg/h via INTRAVENOUS

## 2017-08-08 MED ORDER — GABAPENTIN 300 MG PO CAPS
300.0000 mg | ORAL_CAPSULE | Freq: Three times a day (TID) | ORAL | Status: DC
  Administered 2017-08-09 – 2017-08-11 (×9): 300 mg via ORAL
  Filled 2017-08-08 (×9): qty 1

## 2017-08-08 MED ORDER — ONDANSETRON HCL 4 MG PO TABS
4.0000 mg | ORAL_TABLET | Freq: Four times a day (QID) | ORAL | Status: DC | PRN
  Administered 2017-08-15: 4 mg via ORAL
  Filled 2017-08-08: qty 1

## 2017-08-08 MED ORDER — PHENYLEPHRINE 40 MCG/ML (10ML) SYRINGE FOR IV PUSH (FOR BLOOD PRESSURE SUPPORT)
PREFILLED_SYRINGE | INTRAVENOUS | Status: DC | PRN
  Administered 2017-08-08 (×3): 80 ug via INTRAVENOUS

## 2017-08-08 MED ORDER — FENTANYL CITRATE (PF) 250 MCG/5ML IJ SOLN
INTRAMUSCULAR | Status: AC
  Filled 2017-08-08: qty 5

## 2017-08-08 MED ORDER — ROCURONIUM BROMIDE 10 MG/ML (PF) SYRINGE
PREFILLED_SYRINGE | INTRAVENOUS | Status: AC
  Filled 2017-08-08: qty 5

## 2017-08-08 MED ORDER — LABETALOL HCL 5 MG/ML IV SOLN
INTRAVENOUS | Status: AC
  Filled 2017-08-08: qty 4

## 2017-08-08 MED ORDER — HEPARIN SODIUM (PORCINE) 5000 UNIT/ML IJ SOLN
5000.0000 [IU] | Freq: Once | INTRAMUSCULAR | Status: AC
  Administered 2017-08-08: 5000 [IU] via SUBCUTANEOUS
  Filled 2017-08-08: qty 1

## 2017-08-08 MED ORDER — ONDANSETRON HCL 4 MG/2ML IJ SOLN
4.0000 mg | Freq: Four times a day (QID) | INTRAMUSCULAR | Status: DC | PRN
  Administered 2017-08-10 – 2017-08-14 (×11): 4 mg via INTRAVENOUS
  Filled 2017-08-08 (×12): qty 2

## 2017-08-08 MED ORDER — ALBUMIN HUMAN 5 % IV SOLN
INTRAVENOUS | Status: AC
Start: 2017-08-08 — End: 2017-08-08
  Administered 2017-08-08: 12.5 g via INTRAVENOUS
  Filled 2017-08-08: qty 250

## 2017-08-08 MED ORDER — GABAPENTIN 300 MG PO CAPS
300.0000 mg | ORAL_CAPSULE | ORAL | Status: AC
  Administered 2017-08-08: 300 mg via ORAL
  Filled 2017-08-08: qty 1

## 2017-08-08 MED ORDER — ACETAMINOPHEN 500 MG PO TABS
1000.0000 mg | ORAL_TABLET | Freq: Four times a day (QID) | ORAL | Status: AC
  Administered 2017-08-09 (×2): 1000 mg via ORAL
  Filled 2017-08-08 (×3): qty 2

## 2017-08-08 MED ORDER — FENTANYL CITRATE (PF) 100 MCG/2ML IJ SOLN
25.0000 ug | INTRAMUSCULAR | Status: DC | PRN
  Administered 2017-08-08: 25 ug via INTRAVENOUS
  Administered 2017-08-08: 50 ug via INTRAVENOUS
  Administered 2017-08-08 (×3): 25 ug via INTRAVENOUS

## 2017-08-08 MED ORDER — PROMETHAZINE HCL 25 MG/ML IJ SOLN
6.2500 mg | INTRAMUSCULAR | Status: AC | PRN
  Administered 2017-08-08 (×2): 6.25 mg via INTRAVENOUS

## 2017-08-08 MED ORDER — ALBUMIN HUMAN 5 % IV SOLN
INTRAVENOUS | Status: AC
  Filled 2017-08-08: qty 250

## 2017-08-08 MED ORDER — CLINDAMYCIN PHOSPHATE 900 MG/50ML IV SOLN
INTRAVENOUS | Status: AC
  Filled 2017-08-08: qty 50

## 2017-08-08 MED ORDER — KETAMINE HCL 10 MG/ML IJ SOLN
INTRAMUSCULAR | Status: AC
  Filled 2017-08-08: qty 1

## 2017-08-08 MED ORDER — ALBUMIN HUMAN 5 % IV SOLN
12.5000 g | Freq: Once | INTRAVENOUS | Status: AC
  Administered 2017-08-08: 12.5 g via INTRAVENOUS

## 2017-08-08 MED ORDER — HYDROMORPHONE HCL 1 MG/ML IJ SOLN
0.5000 mg | INTRAMUSCULAR | Status: DC | PRN
  Administered 2017-08-08 – 2017-08-11 (×12): 0.5 mg via INTRAVENOUS
  Filled 2017-08-08 (×12): qty 0.5

## 2017-08-08 MED ORDER — ROCURONIUM BROMIDE 10 MG/ML (PF) SYRINGE
PREFILLED_SYRINGE | INTRAVENOUS | Status: DC | PRN
  Administered 2017-08-08: 20 mg via INTRAVENOUS
  Administered 2017-08-08: 50 mg via INTRAVENOUS
  Administered 2017-08-08 (×2): 10 mg via INTRAVENOUS
  Administered 2017-08-08 (×2): 20 mg via INTRAVENOUS
  Administered 2017-08-08: 10 mg via INTRAVENOUS
  Administered 2017-08-08 (×2): 20 mg via INTRAVENOUS

## 2017-08-08 MED ORDER — SODIUM CHLORIDE 0.9 % IV BOLUS (SEPSIS): 500.0000 mL | Freq: Once | INTRAVENOUS | Status: DC | PRN

## 2017-08-08 MED ORDER — LABETALOL HCL 5 MG/ML IV SOLN
5.0000 mg | Freq: Once | INTRAVENOUS | Status: AC
  Administered 2017-08-08: 5 mg via INTRAVENOUS

## 2017-08-08 MED ORDER — ACETAMINOPHEN 500 MG PO TABS
1000.0000 mg | ORAL_TABLET | ORAL | Status: AC
  Administered 2017-08-08: 1000 mg via ORAL
  Filled 2017-08-08: qty 2

## 2017-08-08 SURGICAL SUPPLY — 81 items
ADAPTER GOLDBERG URETERAL (ADAPTER) ×2 IMPLANT
ADPR CATH 15X14FR FL DRN BG (ADAPTER) ×4
BAG URO CATCHER STRL LF (MISCELLANEOUS) ×6 IMPLANT
BLADE EXTENDED COATED 6.5IN (ELECTRODE) ×6 IMPLANT
BNDG GAUZE ELAST 4 BULKY (GAUZE/BANDAGES/DRESSINGS) ×2 IMPLANT
CATH INTERMIT  6FR 70CM (CATHETERS) ×8 IMPLANT
CATH ROBINSON RED A/P 18FR (CATHETERS) ×2 IMPLANT
CHLORAPREP W/TINT 26ML (MISCELLANEOUS) ×6 IMPLANT
CLOTH BEACON ORANGE TIMEOUT ST (SAFETY) ×6 IMPLANT
COVER FOOTSWITCH UNIV (MISCELLANEOUS) IMPLANT
COVER MAYO STAND STRL (DRAPES) ×6 IMPLANT
COVER SURGICAL LIGHT HANDLE (MISCELLANEOUS) ×8 IMPLANT
DRAIN CHANNEL 19F RND (DRAIN) ×2 IMPLANT
DRAPE LAPAROSCOPIC ABDOMINAL (DRAPES) ×4 IMPLANT
DRAPE LG THREE QUARTER DISP (DRAPES) IMPLANT
DRAPE UTILITY XL STRL (DRAPES) ×4 IMPLANT
DRAPE WARM FLUID 44X44 (DRAPE) ×4 IMPLANT
DRSG OPSITE POSTOP 4X12 (GAUZE/BANDAGES/DRESSINGS) ×2 IMPLANT
DRSG OPSITE POSTOP 4X6 (GAUZE/BANDAGES/DRESSINGS) ×2 IMPLANT
DRSG TELFA 3X8 NADH (GAUZE/BANDAGES/DRESSINGS) ×6 IMPLANT
ELECT REM PT RETURN 15FT ADLT (MISCELLANEOUS) ×6 IMPLANT
EVACUATOR SILICONE 100CC (DRAIN) ×2 IMPLANT
GAUZE SPONGE 4X4 12PLY STRL (GAUZE/BANDAGES/DRESSINGS) ×4 IMPLANT
GLOVE BIOGEL M STRL SZ7.5 (GLOVE) ×6 IMPLANT
GLOVE ECLIPSE 8.0 STRL XLNG CF (GLOVE) ×12 IMPLANT
GLOVE INDICATOR 8.0 STRL GRN (GLOVE) ×6 IMPLANT
GOWN STRL REUS W/TWL LRG LVL3 (GOWN DISPOSABLE) ×12 IMPLANT
GOWN STRL REUS W/TWL XL LVL3 (GOWN DISPOSABLE) ×12 IMPLANT
GUIDEWIRE STR DUAL SENSOR (WIRE) ×6 IMPLANT
HANDLE SUCTION POOLE (INSTRUMENTS) ×4 IMPLANT
KIT BASIN OR (CUSTOM PROCEDURE TRAY) ×4 IMPLANT
LEGGING LITHOTOMY PAIR STRL (DRAPES) ×2 IMPLANT
LIGASURE IMPACT 36 18CM CVD LR (INSTRUMENTS) ×2 IMPLANT
MANIFOLD NEPTUNE II (INSTRUMENTS) ×6 IMPLANT
PACK COLON (CUSTOM PROCEDURE TRAY) ×2 IMPLANT
PACK CYSTO (CUSTOM PROCEDURE TRAY) ×6 IMPLANT
PACK GENERAL/GYN (CUSTOM PROCEDURE TRAY) ×4 IMPLANT
PAD DRESSING TELFA 3X8 NADH (GAUZE/BANDAGES/DRESSINGS) IMPLANT
PAD POSITIONING PINK XL (MISCELLANEOUS) ×6 IMPLANT
POUCH OSTOMY FLEX CONVEX 1-1/2 (OSTOMY) ×2 IMPLANT
RELOAD PROXIMATE 75MM BLUE (ENDOMECHANICALS) ×12 IMPLANT
RELOAD PROXIMATE TA60MM BLUE (ENDOMECHANICALS) ×6 IMPLANT
RELOAD STAPLE 60 BLU REG PROX (ENDOMECHANICALS) IMPLANT
RELOAD STAPLE 75 3.8 BLU REG (ENDOMECHANICALS) IMPLANT
SEALER TISSUE X1 CVD JAW (INSTRUMENTS) IMPLANT
SPONGE DRAIN TRACH 4X4 STRL 2S (GAUZE/BANDAGES/DRESSINGS) ×2 IMPLANT
SPONGE LAP 18X18 5 PK (GAUZE/BANDAGES/DRESSINGS) ×8 IMPLANT
STAPLER CIRC CVD 29MM 37CM (STAPLE) ×2 IMPLANT
STAPLER CUT CVD 40MM GREEN (STAPLE) ×2 IMPLANT
STAPLER GUN LINEAR PROX 60 (STAPLE) ×2 IMPLANT
STAPLER PROXIMATE 75MM BLUE (STAPLE) ×4 IMPLANT
STAPLER VISISTAT 35W (STAPLE) ×4 IMPLANT
SUCTION POOLE HANDLE (INSTRUMENTS) ×6
SUT ETHILON 2 0 PS N (SUTURE) ×2 IMPLANT
SUT ETHILON 3 0 PS 1 (SUTURE) IMPLANT
SUT NOVA 1 T20/GS 25DT (SUTURE) IMPLANT
SUT PDS AB 1 CT1 27 (SUTURE) ×4 IMPLANT
SUT PDS AB 1 CTX 36 (SUTURE) IMPLANT
SUT PDS AB 1 TP1 96 (SUTURE) ×4 IMPLANT
SUT PROLENE 2 0 KS (SUTURE) ×2 IMPLANT
SUT SILK 0 (SUTURE) ×6
SUT SILK 0 30XBRD TIE 6 (SUTURE) IMPLANT
SUT SILK 0 CT 1 30 (SUTURE) IMPLANT
SUT SILK 2 0 (SUTURE) ×6
SUT SILK 2 0 SH (SUTURE) ×4 IMPLANT
SUT SILK 2 0 SH CR/8 (SUTURE) ×8 IMPLANT
SUT SILK 2-0 18XBRD TIE 12 (SUTURE) ×4 IMPLANT
SUT SILK 3 0 (SUTURE) ×6
SUT SILK 3 0 SH CR/8 (SUTURE) ×10 IMPLANT
SUT SILK 3-0 18XBRD TIE 12 (SUTURE) ×4 IMPLANT
SUT VIC AB 2-0 SH 18 (SUTURE) IMPLANT
SUT VIC AB 2-0 SH 27 (SUTURE) ×6
SUT VIC AB 2-0 SH 27X BRD (SUTURE) IMPLANT
SUT VIC AB 3-0 SH 18 (SUTURE) ×4 IMPLANT
TAPE CLOTH SURG 4X10 WHT LF (GAUZE/BANDAGES/DRESSINGS) ×2 IMPLANT
TOWEL OR 17X26 10 PK STRL BLUE (TOWEL DISPOSABLE) ×10 IMPLANT
TOWEL OR NON WOVEN STRL DISP B (DISPOSABLE) ×6 IMPLANT
TRAY FOLEY BAG SILVER LF 14FR (CATHETERS) ×2 IMPLANT
TRAY FOLEY W/METER SILVER 16FR (SET/KITS/TRAYS/PACK) IMPLANT
TUBING CONNECTING 10 (TUBING) ×2 IMPLANT
TUBING CONNECTING 10' (TUBING) ×2

## 2017-08-08 NOTE — H&P (Signed)
CC: F/u - discuss Hartmann's reversal Wanda Baker is a pleasant 32F with hx of HTN & DM now resolved with diet/exercise presents for evaluation of Hartmann's reversal.  She has a history of obstructing rectal cancer - mass found at 15cm on urgent endoscopy - then s/p exlap/LAR with diverting colostomy by Dr. Dalbert Batman 09/18/2016. Intraoperatively a mass was found just beneath the peritoneal reflection - stapling 2-3cm below the mass and noted to be near the pelvic floor. 2 #1 prolene sutures were placed on the rectal stump. No sign of metastatic cancer at that operation. 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 CT A/P 09/16/16 showed no metastatic disease in the abdomen. Subsequent CXR was normal. She has subsequently been treated with Xeloda + XRT. She completed adjuvant chemo 05/04/17. She denies any complaints today. Stoma working well. Scant mucoid discharge per rectum.  GGE 04/16/17 - normal water-soluble contrast enema-rectal stump appears to be somewhere around the mid rectum Colonoscopy 04/18/17 - Dr. Silverio Decamp - 69m polyp in the ascending colon that was sessile and removed piecemeal. Resection and retrieval were complete. Pathology returned tubular adenoma without high-grade dysplasia  PMH: HTN/DM now resolved  PSH: C-sx x2; laparoscopic assisted hysterectomy for fibroids 2011; 2015 - neck surgery - c-spine fusion, DJD.  Social: Denies use of tobacco/etoh/drugs  FHx: Denies FHx of malignancy.  The patient is a 57year old female.   Allergies Vicodin *ANALGESICS - OPIOID*  PredniSONE *CORTICOSTEROIDS*  Penicillins  Allergies Reconciled    Medication History Xeloda (500MG Tablet, Oral) Active. Gabapentin (300MG Capsule, Oral) Active. Singulair (10MG Tablet, Oral) Active. PriLOSEC (20MG Capsule DR, Oral) Active. TraMADol HCl (50MG Tablet, Oral) Active. Florastor (250MG Capsule, Oral) Active. Probiotic (Oral) Active. Ferrous Sulfate (325 (65  Fe)MG Tablet, Oral) Active. Medications Reconciled    Review of Systems General Not Present- Appetite Loss, Chills, Fatigue, Fever, Night Sweats, Weight Gain and Weight Loss. Skin Not Present- Change in Wart/Mole, Dryness, Hives, Jaundice, New Lesions, Non-Healing Wounds, Rash and Ulcer. HEENT Present- Seasonal Allergies and Wears glasses/contact lenses. Not Present- Earache, Hearing Loss, Hoarseness, Nose Bleed, Oral Ulcers, Ringing in the Ears, Sinus Pain, Sore Throat, Visual Disturbances and Yellow Eyes. Respiratory Not Present- Bloody sputum, Chronic Cough, Difficulty Breathing, Snoring and Wheezing. Breast Not Present- Breast Mass, Breast Pain, Nipple Discharge and Skin Changes. Cardiovascular Not Present- Chest Pain, Difficulty Breathing Lying Down, Leg Cramps, Palpitations, Rapid Heart Rate, Shortness of Breath and Swelling of Extremities. Female Genitourinary Not Present- Frequency, Nocturia, Painful Urination, Pelvic Pain and Urgency. Musculoskeletal Not Present- Back Pain, Joint Pain, Joint Stiffness, Muscle Pain, Muscle Weakness and Swelling of Extremities. Neurological Not Present- Decreased Memory, Fainting, Headaches, Numbness, Seizures, Tingling, Tremor, Trouble walking and Weakness. Psychiatric Not Present- Anxiety, Bipolar, Change in Sleep Pattern, Depression, Fearful and Frequent crying. Hematology Not Present- Blood Thinners, Easy Bruising, Excessive bleeding, Gland problems, HIV and Persistent Infections.  Vitals:   08/08/17 0620  Weight: 71.2 kg (157 lb)  Height: 5' 7"  (1.702 m)          Physical Exam (Harrell GaveM. Ankit Degregorio MD; 06/25/2017 10:23 AM) The physical exam findings are as follows: Note:Constitutional: No acute distress; conversant; no deformities Eyes: Moist conjunctiva; no lid lag; anicteric sclerae; pupils equal round and reactive to light Neck: Trachea midline; no palpable thyromegaly Lungs: Normal respiratory effort; no tactile  fremitus CV: Regular rate and rhythm; no palpable thrill; no pitting edema GI: Abdomen soft, nontender, nondistended; no palpable hepatosplenomegaly; left lower quadrant colostomy with associated and reducible parastomal  hernia-stool in bag, colostomy pink Anorectal: Good anal tone. No palpable masses. The distal portion of the rectal stump was not palpable on exam. MSK: Normal gait; no clubbing/cyanosis Psychiatric: Appropriate affect; alert and oriented 3 Lymphatic: No palpable cervical or axillary lymphadenopathy **A chaperone, Lennart Pall, was present for the entire physical exam    Assessment & Plan RECTUM CANCER (C20) Impression: Wanda Baker is a very pleasant 52yoF with hx of obstructing proximal rectal cancer status post Hartman's procedure for/2018 by Dr. Dalbert Batman. She then underwent adjuvant chemoradiation which she completed 05/04/17 and tolerated well. Subsequent Gastrografin enema as well as colonoscopy has since been completed. She is now here to discuss stoma reversal.   -We will schedule her for exploratory laparotomy, Hartmann takedown with cystoscopy/stents by urology given history of rectal cancer resection and chemotherapy/radiation. Abx/miralax bowel prep -The anatomy and physiology of the GI tract was discussed at length with the patient. The pathophysiology of rectal cancer was again discussed at length with associated pictures and illustrations. -The planned procedure, material risks (including, but not limited to, pain, bleeding, infection, scarring, need for blood transfusion, damage to surrounding structures- blood vessels/nerves/viscus/organs, damage to ureter, urine leak, leak from anastomosis, need for additional procedures, need for stoma which may be permanent, hernia, recurrence, pneumonia, heart attack, stroke, death) benefits and alternatives to surgery were discussed at length. I noted a good probability that the procedure would help improve their symptoms. The  patient's questions were answered to their satisfaction and they elected to proceed with surgery.  Sharon Mt. Dema Severin, M.D. General and Colorectal Surgery [facility]

## 2017-08-08 NOTE — Anesthesia Postprocedure Evaluation (Signed)
Anesthesia Post Note  Patient: Wanda Baker  Procedure(s) Performed: EXPLORATORY LAPAROTOMY (N/A Abdomen) HARTMANNS TAKEDOWN OF COLOSTOMY WITH COLORECTAL ANASTOMOIS ERAS PATHWAY (N/A Abdomen) FLEXIBLE SIGMOIDOSCOPY (N/A Rectum) DIVERTIN  ILEOSTOMY (N/A Abdomen) LYSIS OF ADHESION (Abdomen) SMALL BOWEL RESECTION x 2 (Abdomen) CYSTOSCOPY WITH URETERAL CATHETERS (N/A )     Patient location during evaluation: PACU Anesthesia Type: General Level of consciousness: awake and alert Pain management: pain level controlled Vital Signs Assessment: post-procedure vital signs reviewed and stable Respiratory status: spontaneous breathing, nonlabored ventilation, respiratory function stable and patient connected to nasal cannula oxygen Cardiovascular status: blood pressure returned to baseline and stable Postop Assessment: no apparent nausea or vomiting Anesthetic complications: no    Last Vitals:  Vitals:   08/08/17 1715 08/08/17 1730  BP: 117/79 104/74  Pulse: (!) 102 (!) 104  Resp: 16 16  Temp:    SpO2: 99% 99%    Last Pain:  Vitals:   08/08/17 1730  PainSc: Asleep                 Effie Berkshire

## 2017-08-08 NOTE — Op Note (Signed)
Preoperative diagnosis:  1. History of colorectal cancer with Hartman's pouch  Postoperative diagnosis:  1. Same  Procedure: 1. Cystoscopy, retrograde pyelogram with interpretation (bilateral), placement of temporary ureteral stents.  Surgeon: Ardis Hughs, MD  Anesthesia: General  Complications: None  Intraoperative findings:  #1: The patient's bladder was normal in appearance.  The trigone was slightly distorted, the posterior floor of the bladder appeared to be slightly retracted pushing the ureteral orifice deeper into the pelvis.  There are no radiation changes or other mucosal abnormalities. #2: The left retrograde pyelogram was performed using 10 cc of Omnipaque contrast.  This demonstrated a normal caliber ureter with no filling defects or hydroureteronephrosis.  The calyces were sharp. #3.:  The right retrograde pyelogram was performed using 10 cc of Omnipaque contrast.  This demonstrated a normal caliber ureter with no filling defects or hydroureteronephrosis.  The calyces were sharp. #4: 6 French open-ended ureteral catheters were advanced up to the renal pelvis bilaterally under fluoroscopic guidance.  The end of the left stent was cut off at a 45 degree angle, the right stent uncut at a 90 degree angle or right angle.  EBL: Minimal  Specimens: None  Indication: Wanda Baker is a 57 y.o. patient with history of colorectal cancer who has already had her cancer surgically resected and undergone chemo and radiation.  She presents today for reversal of her Hartmann's pouch.  As part of the procedure Dr. Dema Severin has asked for temporary indwelling stents to be placed.  After reviewing the management options for treatment, he elected to proceed with the above surgical procedure(s). We have discussed the potential benefits and risks of the procedure, side effects of the proposed treatment, the likelihood of the patient achieving the goals of the procedure, and any potential  problems that might occur during the procedure or recuperation. Informed consent has been obtained.  Description of procedure:  The patient was taken to the operating room and general anesthesia was induced.  The patient was placed in the dorsal lithotomy position, prepped and draped in the usual sterile fashion, and preoperative antibiotics were administered. A preoperative time-out was performed.   21 French 30 degree cystoscope was gently passed through the patient's urethra and the patient's bladder under visual guidance.  A 360 degrees cystoscopic evaluation was then performed with the above findings.  A 6 French open-ended ureteral catheter was then used to cannulate the patient's left ureteral orifice and 10 cc of Omnipaque contrast was instilled through the catheter and a retrograde pyelogram was performed under fluoroscopy with the above findings.  I then advanced the 6 French catheter up to the renal pelvis again under fluoroscopy.  I then turned my attention to the patient's right ureteral orifice and cannulated the right orifice with a second 6 [place] open-ended ureteral catheter.  Again, with 10 cc of Omnipaque contrast a retrograde pyelogram was performed with the above findings.  The 6 French catheter was then advanced up to the renal pelvis under fluoroscopic guidance.  I then backed out the cystoscope leaving the stents in good position.  I cut a 45 degree angle on the left stent to identify it leaving the right stent and a right angle.  I then placed a Foley catheter and with a suture tied the stents to the catheter.  The stents were then connected to a Lyda Kalata adapter and a Foley catheter bag was placed.  At this point the case was turned over to Dr. Dema Severin.  It is okay  for the stents to be removed at the end of the case.  Please contact me with any further questions or additional concerns.  Ardis Hughs, M.D.

## 2017-08-08 NOTE — Transfer of Care (Signed)
Immediate Anesthesia Transfer of Care Note  Patient: Wanda Baker  Procedure(s) Performed: Procedure(s): EXPLORATORY LAPAROTOMY (N/A) HARTMANNS TAKEDOWN OF COLOSTOMY WITH COLORECTAL ANASTOMOIS ERAS PATHWAY (N/A) FLEXIBLE SIGMOIDOSCOPY (N/A) DIVERTIN  ILEOSTOMY (N/A) CYSTOSCOPY WITH STENT REPLACEMENT (N/A) LYSIS OF ADHESION SMALL BOWEL RESECTION x 2  Patient Location: PACU  Anesthesia Type:General  Level of Consciousness:  sedated, patient cooperative and responds to stimulation  Airway & Oxygen Therapy:Patient Spontanous Breathing and Patient connected to face mask oxgen  Post-op Assessment:  Report given to PACU RN and Post -op Vital signs reviewed and stable  Post vital signs:  Reviewed and stable  Last Vitals: There were no vitals filed for this visit.  Complications: No apparent anesthesia complications

## 2017-08-08 NOTE — Op Note (Addendum)
08/08/2017  4:07 PM  PATIENT:  Wanda Baker  57 y.o. female  Patient Care Team: Tonette Bihari, MD as PCP - General (Family Medicine)  PRE-OPERATIVE DIAGNOSIS:  History of rectal cancer s/p Hartmann's procedure  POST-OPERATIVE DIAGNOSIS:  History of rectal cancer s/p Hartmann's procedure  PROCEDURE:   1. Exploratory laparotomy 2. Lysis of adhesions x60 minutes 3. Takedown of splenic flexure 4. Takedown of colostomy with stapled colorectal anastomosis 5. Small bowel resection with enteroenterostomy 6. Flexible sigmoidoscopy 7. Creation of ileostomy  SURGEON:  Sharon Mt. Dema Severin, MD  ASSISTANT: Michael Boston, MD - an MD assistant was necessary for complex pelvic exposure, complex retraction and additionally for creation of a stapled colorectal anastomosis.   ANESTHESIA:   general  COUNTS:  Sponge, needle and instrument counts were reported correct x2 at the conclusion of the operation.  EBL: 700cc  DRAINS: 19Fr blake drain left draining pelvis  SPECIMEN:  1. Peritoneal right pelvic nodule biopsy (frozen, permanent) 2. Descending colon 3. Radiated small bowel  FINDINGS:  Significant burden of intra-abdominal adhesions between small bowel and small bowel, small bowel and mesentery, and small bowel and colon.  Adhesiolysis x 60 minutes.  2 loops of small bowel were stuck in the deep pelvis with irradiated scar tissue around them.  These were ultimately brought up and 1 of them was mid small bowel and required resection due to radiation changes and deserosalization's.  The other was distal ileum and was used for creation of the ileostomy.  This was located ~30 cm from the cecum.  7 cm rectal cuff identified in the pelvis.  There was a peritoneal nodule encountered in the pelvis which was sent for frozen section.  Frozen analysis came back benign fibrosis.  The rectum was densely adherent to all surrounding pelvic structures.  The ureters were identified and protected  throughout their course through the pelvic sidewalls.  Ultimately, the rectum was dissected free and the colostomy taken down.  At the level of the peritoneum there was a colotomy created during dissection which was inherent to the nature of the procedure.  This was resected at this level.  The splenic flexure was mobilized and the IMV was ligated at the inferior border the pancreas.  After doing this the mid descending colon easily reached the pelvis without any tension.  A double stapled colorectal anastomosis was created.  Negative leak test. Diverting ileostomy.  [place] was left draining the pelvis.  30 cm proximal to the cecum was the second segment of irradiated and deserosalized small bowel.  The intervening irradiated/deserosalized 5 cm of small intestine was resected and the 2 ends of ileum brought up as an ileostomy.  This is essentially a double barrel ileostomy with the proximal end being brooked and the distal end serving as a mucous fistula with half of it staple line opened and matured to the skin.  DISPOSITION: PACU in satisfactory condition  INDICATION: [name] is a very pleasant 82yoF with hx of diet controlled HTN & DM who was referred to my by my partner, Dr. Dalbert Batman for consideration of colostomy reversal.  She presented to the emergency room 09/18/16 with an obstructing rectal cancer located approximately 15 cm from the verge on an urgent endoscopy.  She was taken to the operating room by Dr. Dalbert Batman for an urgent exploratory laparotomy with low anterior resection and end colostomy.  He reported stapling approximately 2-3 cm below the mass.  CT A/P demonstrated no evidence of metastatic disease;  chest imaging by oncology did not show evidence of metastatic disease in the chest.  Pathology returned T3 N0 M0 (0/22 lymph nodes) and effectively stage II rectal adenocarcinoma, MSI stable.  She subsequently underwent adjuvant Xeloda and radiation, which she tolerated well and completed  05/04/17.  She was seen in the office for evaluation of stoma reversal.  She is unhappy with her quality of life with the colostomy and desired takedown.  We obtained a Gastrografin enema which showed a normal-appearing rectal stump somewhere around the mid rectum.  Colonoscopy by Dr. Silverio Decamp 04/18/17 was remarkable for normal-appearing rectal stump with diversion proctitis, 18 mm polyp in the ascending colon that was sessile and removed completely, pathology returned tubular adenoma without dysplasia.  In addition to having recently received pelvic radiation, she has a history of 2 prior cesareans as well as laparoscopic assisted hysterectomy for fibroids.  We discussed the likely difficulty of the procedure given all of this and everything this could entail. The anatomy and physiology of the GI was described in detail using pictures/diagrams. The pathophysiology was also detailed. The technical aspects of the planned procedures, material risks (including, but not limited to, pain, bleeding, infection, scarring, damage to surrounding structures, need for additional procedures, abscess, seroma, hematoma, hernia, damage to other organs/viscus/bladder/ureter, urine leak, anastomotic leak, damage to nerves, problems urinating, sexual dysfunction, heart attack, stroke, death), benefits and alternatives were explained. The typical recovery, expectations and goals were also discussed at length. Her questions were answered to her satisfaction and she elected to proceed with surgery.  DESCRIPTION: The patient was identified in preop holding and taken to the OR where they were placed on the operating room table and SCDs were placed. General endotracheal anesthesia was induced without difficulty. The patient was repositioned into the low lithotomy position with Allen stirrups. All pressure points were carefully padded and orientation of the legs confirmed to ensure no pressure points or nerve compression. The patient was  then prepped and draped in the usual sterile fashion for cystoscopy/stents by urology. Please refer to Dr. Carlton Adam note for details regarding this portion of the procedure.  A surgical timeout was then performed for our portions of the procedure, indicating the correct patient, procedure, positioning and need for preoperative antibiotics. A flexible sigmoidoscopy was performed and the remnant rectal stump measured to be 7cm from anal verge. No remarkable findings were noted. The rectum appeared healthy.  A midline laparotomy was made and carried down to the level of the fascia. The scar from her prior open skin wound was excised. The fascia was opened superiorly to her prior surgeries. The peritoneal cavity was then entered carefully. Midline adhesions were meticulously taken down sharply. Adhesions of small bowel and mesentery were noted extending into the pelvis. After the abdominal cavity had been freed of significant adhesions which limited retraction and exposure, attention was turned to the pelvis. Two separate loops of small bowel loops entered the pelvis and were completely fibrosed in place likely from the prior radiation. The left and right ureters were palpated with stents in place and subsequently exposed to ensure protection during the pelvic portion of the procedure. The patient was placed in Trendelenburg. A Bookwalter retractor was then placed. The two separate loops of bowel were ultimately freed from their attachments to the deep pelvis. At this point the rectal stump was identified and prolene sutures were visible. The two segments of bowel removed were inspected.  Both contain deserosalization's at the apex where they were stuck in the  pelvis.  They also had chronic radiation appearing changes to their serosal surface.  One loop was from the mid small bowel and the second was distal ileum.  Given the potential need for an ileostomy the distal ileum segment was left alone for the time  being.  There was a small nodule in the deep pelvis that was visible once the loops of small bowel have been removed.  This was approximately 5 x 5 mm in size.  Given this and her history of rectal cancer, a frozen section was obtained and returned benign, fibrotic changes.  Attention was turned to the mid small bowel portion of deserosalized/radiated bowel.  The deserosalization was inherent to the procedure.  The decision was made to resect this segment of small bowel.  This was approximately 10 cm in length.  Windows were made in the mesentery on the mesenteric border of the small bowel at 2 healthy segments.  The bowel was divided using GIA blue loads.  The intervening mesentery was ligated using a LigaSure.  The excised segment of small bowel was then passed off the specimen.  Hemostasis was verified.  The bowel was then prepared for anastomosis.  The corners of each antimesenteric staple line were excised and an anastomosis was created using a GIA 75 mm blue load stapler.  The common enterotomy and existing staple lines were then excised using a TX 60 blue load.  The staple line was hemostatic.  The anastomosis was palpated and noted to be widely patent, extending for at least 5 cm.  Two "crotch" sutures of 3-0 silk were then placed.  Attention was then returned to the pelvic dissection.  The rectal stump was carefully dissected.  With the aid of an assistant, digital palpation of both the rectum and vagina was utilized to assist in identification of pertinent anatomy given the degree of fibrosis from prior surgeries and radiation.  Ultimately, the rectum was dissected free of the overlying vagina and inspection of both surfaces revealed no evidence of injury to the rectum or posterior wall the vagina.  EEA sizers were then passed into the rectal stump and it easily accommodated a 29 mm EEA.  Given the amount of scarring in the pelvis passage of a 33 mm EEA was deemed to be highly likely to injure the  rectum.  For this reason, a 29 mm EEA was ultimately planned.  Attention was then turned to taking down the colostomy.  The colostomy was circumferentially incised and dissected down the level of the fascia.  There was an empty parastomal hernia at this location and the hernia sac was excised.  At the location of the colostomy turned and went through the abdominal wall there was significant scarring and adhesions and during attempted mobilization a colotomy was created.  This was inherent to the procedure.  The segment of intervening colon from the stoma site to the level of the colotomy was then excised using a GIA blue load stapler.  The intervening mesentery was ligated with a LigaSure. The excised segment of colon was then passed off the specimen.  Hemostasis was verified.  Attention was turned to mobilizing the splenic flexure.  The attachments of the left colon to the Oliviya Gilkison line of Toldt were freed.  This was carried up to the level of the spleen and then medially mobilizing the distal transverse colon.  The omentum at this level was removed from the colon.  The midportion of the transverse colon was additionally mobilized.  To ensure  appropriate reach and a tension-free anastomosis, the decision was made to ligate the IMV.  This was done at the level of the inferior border the pancreas using a LigaSure.  At this point the left colon easily reached down to the pelvic floor without any tension whatsoever.  Attention was turned to creating the colorectal anastomosis.  The descending colon was inspected and the left colic supplied the remaining segment of left colon with a major branch at the distal end.  There was a palpable pulse in the marginal artery at this level as well. A 29 mm EEA stapler was opened.  A pursestring device was placed just beneath the staple line of the left colon.  A 2-0 Prolene suture on a Keith needle was passed through the pursestring device.  Staple line was then excised.  The  anvil was then placed in the pursestring tied.  The colon at this level was inspected and there were no diverticula extending into the staple line.  I then went below for passage of the EEA stapler. I was able to clearly see into the pelvis as well over the [facility]. Mark's retractor.  A [employer] retractor was then used, retracting the posterior wall of vagina off the anterior wall of the rectum.  The stapler was carefully passed and the spike deployed.  This was on the distal, posterior wall of the rectum.  The anvil was then mated with the stapler.  The stapler was closed, held and fired.  Stapler was then removed and the donuts were inspected.  Both donuts were noted to be complete and intact.  Attention was then turned to performing a leak test.  The colon proximally anastomosis was then occluded, the pelvis filled with water, and a flexible sigmoidoscopy was then performed.  The anastomosis was identified at approximately 6 cm from the anal verge.  The anastomosis was patent, hemostatic, and both sides of the bowel were pink.  Attention was then turned to creation of the diverting ileostomy and closure.  The site where she had previously been marked for an ileostomy was then chosen for creation of her ileostomy.  A skin disc was excised the subcu venous tissue was removed.  The anterior rectus sheath was identified and incised transversely with small cruciates superiorly and inferiorly.  The rectus muscle was then spread and the posterior sheath identified.  The underlying bowel was protected with the placement of a laparotomy pad and hand.  The posterior sheath was then incised.  The peritoneal cavity was entered.  The defect was large enough to accommodate 2 fingers.  The planned segment of distal ileum where deserosalization had occurred was selected as the site for the stoma.  Given that there was radiation changes to the segment the decision was made to excise approximately 5-7 cm of affected small bowel  and bring up the 2 ends of ileum side by side at the stoma site.  The intervening mesentery was ligated with a LigaSure stasis was verified.  Orientation of proximal and distal limbs of ileum were confirmed and then reconfirmed.  The proximal limb was placed superiorly at the stoma site and then additionally marked with a purple marker.  The small bowel and colon was then reexamined. One additional deserosalization site on the small bowel was identified that was small.  This was repaired with 3, 3-0 silk sutures in a Lembert fashion.  No additional deserosalization's or concerning findings were found.  All equipment was then passed off.  A closing  tray was then brought in the field.  Gowns and gloves of all participants were changed.  The abdomen was draped with clean sterile drapes.  The abdomen was then copiously irrigated.  Hemostasis was then verified.  A 19 [place] Blake drain was placed, draining the pelvis and anastomosis.  A tongue of omentum from the transverse colon was then passed in the pelvis as well.  The prior colostomy site was then closed using full-thickness bites of a running #1 PDS suture incorporating both the anterior and posterior rectus sheaths.  The skin of the colostomy site was then cinched down using a pursestring 0 Vicryl suture. The midline fascia was then closed with two running #1 looped PDS sutures.  The skin of the midline incision was closed with staples.  Attention was turned to creating the stoma.  The proximal limb of ileum was identified and the staple line excised.  This was then brooked and secured to the skin with 3-0 Vicryl sutures.  The distal limb was identified and approximately one half the staple line excised.  This was subsequently matured to the skin as a mucous fistula using 3-0 Vicryl sutures.  The staple line was additionally tacked to the inferomedial portion of the ileostomy site with 3-0 vicryl suture to ensure it would not retract.  All incision sites  were dressed.  The prior colostomy site was packed with a moist Kerlex.  A stoma appliance was applied to the ileostomy.  The ureteral stents were subsequently removed.  The patient was then awakened from general anesthesia, transferred to a stretcher for transport to PACU in satisfactory condition   Note: This dictation was prepared with Dragon/digital dictation along with [employer]. Any transcriptional errors that result from this process are unintentional.

## 2017-08-08 NOTE — Anesthesia Preprocedure Evaluation (Addendum)
Anesthesia Evaluation  Patient identified by MRN, date of birth, ID band Patient awake    Reviewed: Allergy & Precautions, NPO status , Patient's Chart, lab work & pertinent test results  Airway Mallampati: III  TM Distance: <3 FB Neck ROM: Full    Dental  (+) Teeth Intact, Dental Advisory Given   Pulmonary former smoker,    breath sounds clear to auscultation       Cardiovascular hypertension,  Rhythm:Regular Rate:Normal     Neuro/Psych  Neuromuscular disease negative psych ROS   GI/Hepatic Neg liver ROS, GERD  Medicated and Controlled,  Endo/Other  diabetes, Type 2, Oral Hypoglycemic Agents  Renal/GU negative Renal ROS     Musculoskeletal  (+) Arthritis ,   Abdominal (+) + obese,   Peds  Hematology   Anesthesia Other Findings   Reproductive/Obstetrics                            Lab Results  Component Value Date   WBC 5.8 08/04/2017   HGB 14.5 08/04/2017   HCT 42.3 08/04/2017   MCV 86.3 08/04/2017   PLT 273 08/04/2017   Lab Results  Component Value Date   CREATININE 0.53 08/04/2017   BUN 7 08/04/2017   NA 138 08/04/2017   K 4.1 08/04/2017   CL 105 08/04/2017   CO2 23 08/04/2017   Lab Results  Component Value Date   INR 0.96 08/04/2017   EKG: sinus tachycardia.  Anesthesia Physical Anesthesia Plan  ASA: II  Anesthesia Plan: General   Post-op Pain Management:    Induction: Intravenous  PONV Risk Score and Plan: 4 or greater and Ondansetron, Midazolam and Scopolamine patch - Pre-op  Airway Management Planned: Oral ETT and Video Laryngoscope Planned  Additional Equipment: None  Intra-op Plan:   Post-operative Plan: Extubation in OR  Informed Consent:   Dental advisory given  Plan Discussed with: CRNA  Anesthesia Plan Comments:       Anesthesia Quick Evaluation

## 2017-08-08 NOTE — Anesthesia Procedure Notes (Signed)
Procedure Name: Intubation Date/Time: 08/08/2017 7:42 AM Performed by: Anne Fu, CRNA Pre-anesthesia Checklist: Patient identified, Emergency Drugs available, Suction available, Patient being monitored and Timeout performed Patient Re-evaluated:Patient Re-evaluated prior to induction Oxygen Delivery Method: Circle system utilized Preoxygenation: Pre-oxygenation with 100% oxygen Induction Type: IV induction Ventilation: Mask ventilation without difficulty Laryngoscope Size: Mac and 3 Grade View: Grade I Tube type: Oral Tube size: 7.5 mm Number of attempts: 1 Airway Equipment and Method: Video-laryngoscopy and Stylet Placement Confirmation: ETT inserted through vocal cords under direct vision,  positive ETCO2 and breath sounds checked- equal and bilateral Secured at: 21 cm Tube secured with: Tape Dental Injury: Teeth and Oropharynx as per pre-operative assessment  Difficulty Due To: Difficulty was anticipated, Difficult Airway- due to limited oral opening and Difficult Airway- due to reduced neck mobility Future Recommendations: Recommend- induction with short-acting agent, and alternative techniques readily available Comments: Grade 1 view with glidescope MAC 3.

## 2017-08-09 ENCOUNTER — Encounter (HOSPITAL_COMMUNITY): Payer: Self-pay | Admitting: Surgery

## 2017-08-09 ENCOUNTER — Other Ambulatory Visit: Payer: Self-pay

## 2017-08-09 LAB — COMPREHENSIVE METABOLIC PANEL
ALBUMIN: 2.8 g/dL — AB (ref 3.5–5.0)
ALK PHOS: 41 U/L (ref 38–126)
ALT: 19 U/L (ref 14–54)
AST: 27 U/L (ref 15–41)
Anion gap: 11 (ref 5–15)
BUN: 15 mg/dL (ref 6–20)
CALCIUM: 8 mg/dL — AB (ref 8.9–10.3)
CHLORIDE: 102 mmol/L (ref 101–111)
CO2: 22 mmol/L (ref 22–32)
CREATININE: 0.84 mg/dL (ref 0.44–1.00)
GFR calc Af Amer: >60 mL/min (ref >60)
GFR calc non Af Amer: >60 mL/min (ref >60)
GLUCOSE: 122 mg/dL — AB (ref 65–99)
Potassium: 3.9 mmol/L (ref 3.5–5.1)
Sodium: 135 mmol/L (ref 135–145)
Total Bilirubin: 1.3 mg/dL — ABNORMAL HIGH (ref 0.3–1.2)
Total Protein: 5.2 g/dL — ABNORMAL LOW (ref 6.5–8.1)

## 2017-08-09 LAB — BASIC METABOLIC PANEL
ANION GAP: 14 (ref 5–15)
Anion gap: 12 (ref 5–15)
BUN: 10 mg/dL (ref 6–20)
BUN: 9 mg/dL (ref 6–20)
CALCIUM: 7.9 mg/dL — AB (ref 8.9–10.3)
CHLORIDE: 104 mmol/L (ref 101–111)
CO2: 22 mmol/L (ref 22–32)
CO2: 24 mmol/L (ref 22–32)
CREATININE: 0.85 mg/dL (ref 0.44–1.00)
Calcium: 8.1 mg/dL — ABNORMAL LOW (ref 8.9–10.3)
Chloride: 102 mmol/L (ref 101–111)
Creatinine, Ser: 1.08 mg/dL — ABNORMAL HIGH (ref 0.44–1.00)
GFR calc Af Amer: >60 mL/min (ref >60)
GFR calc non Af Amer: 56 mL/min — ABNORMAL LOW (ref >60)
GFR calc non Af Amer: >60 mL/min (ref >60)
Glucose, Bld: 146 mg/dL — ABNORMAL HIGH (ref 65–99)
Glucose, Bld: 151 mg/dL — ABNORMAL HIGH (ref 65–99)
POTASSIUM: 4.2 mmol/L (ref 3.5–5.1)
Potassium: 4.1 mmol/L (ref 3.5–5.1)
SODIUM: 136 mmol/L (ref 135–145)
SODIUM: 142 mmol/L (ref 135–145)

## 2017-08-09 LAB — CBC
HCT: 33.2 % — ABNORMAL LOW (ref 36.0–46.0)
HCT: 33.5 % — ABNORMAL LOW (ref 36.0–46.0)
HEMATOCRIT: 36.4 % (ref 36.0–46.0)
HEMOGLOBIN: 11 g/dL — AB (ref 12.0–15.0)
HEMOGLOBIN: 11.1 g/dL — AB (ref 12.0–15.0)
HEMOGLOBIN: 12.3 g/dL (ref 12.0–15.0)
MCH: 28.9 pg (ref 26.0–34.0)
MCH: 29.5 pg (ref 26.0–34.0)
MCH: 29.9 pg (ref 26.0–34.0)
MCHC: 32.8 g/dL (ref 30.0–36.0)
MCHC: 33.4 g/dL (ref 30.0–36.0)
MCHC: 33.8 g/dL (ref 30.0–36.0)
MCV: 88.2 fL (ref 78.0–100.0)
MCV: 88.3 fL (ref 78.0–100.0)
MCV: 88.6 fL (ref 78.0–100.0)
PLATELETS: 235 10*3/uL (ref 150–400)
Platelets: 230 10*3/uL (ref 150–400)
Platelets: 282 10*3/uL (ref 150–400)
RBC: 3.76 MIL/uL — AB (ref 3.87–5.11)
RBC: 3.8 MIL/uL — AB (ref 3.87–5.11)
RBC: 4.11 MIL/uL (ref 3.87–5.11)
RDW: 13.1 % (ref 11.5–15.5)
RDW: 13.2 % (ref 11.5–15.5)
RDW: 13.3 % (ref 11.5–15.5)
WBC: 11.7 10*3/uL — AB (ref 4.0–10.5)
WBC: 11.9 10*3/uL — AB (ref 4.0–10.5)
WBC: 7.7 10*3/uL (ref 4.0–10.5)

## 2017-08-09 LAB — PHOSPHORUS: PHOSPHORUS: 4 mg/dL (ref 2.5–4.6)

## 2017-08-09 LAB — MAGNESIUM
MAGNESIUM: 1.2 mg/dL — AB (ref 1.7–2.4)
Magnesium: 2.6 mg/dL — ABNORMAL HIGH (ref 1.7–2.4)

## 2017-08-09 LAB — GLUCOSE, CAPILLARY
GLUCOSE-CAPILLARY: 147 mg/dL — AB (ref 65–99)
GLUCOSE-CAPILLARY: 109 mg/dL — AB (ref 65–99)
Glucose-Capillary: 145 mg/dL — ABNORMAL HIGH (ref 65–99)
Glucose-Capillary: 110 mg/dL — ABNORMAL HIGH (ref 65–99)
Glucose-Capillary: 100 mg/dL — ABNORMAL HIGH (ref 65–99)

## 2017-08-09 LAB — LACTIC ACID, PLASMA
Lactic Acid, Venous: 3.9 mmol/L (ref 0.5–1.9)
Lactic Acid, Venous: 6.5 mmol/L (ref 0.5–1.9)

## 2017-08-09 MED ORDER — SODIUM CHLORIDE 0.9 % IV BOLUS (SEPSIS)
1000.0000 mL | Freq: Once | INTRAVENOUS | Status: AC
  Administered 2017-08-09: 1000 mL via INTRAVENOUS

## 2017-08-09 MED ORDER — METOPROLOL TARTRATE 5 MG/5ML IV SOLN
5.0000 mg | Freq: Once | INTRAVENOUS | Status: AC
  Administered 2017-08-09: 5 mg via INTRAVENOUS

## 2017-08-09 MED ORDER — METOPROLOL TARTRATE 12.5 MG HALF TABLET
12.5000 mg | ORAL_TABLET | Freq: Two times a day (BID) | ORAL | Status: DC
  Administered 2017-08-09 (×2): 12.5 mg via ORAL
  Filled 2017-08-09 (×2): qty 1

## 2017-08-09 MED ORDER — MAGNESIUM SULFATE 4 GM/100ML IV SOLN
4.0000 g | Freq: Once | INTRAVENOUS | Status: AC
  Administered 2017-08-09: 4 g via INTRAVENOUS
  Filled 2017-08-09: qty 100

## 2017-08-09 NOTE — Progress Notes (Addendum)
Pt drowsy, arouses easily to verbal stimuli. HR 130-140 via dinamapp, heart rate also noted elevated doing apical check. Rapid Response nurse notified to assist in evaluation of patient. See notes by Rapid Response nurse.

## 2017-08-09 NOTE — Progress Notes (Signed)
Pt's heart rate 130+,  Despite receiving pain medication previously. Rapid Response nurse notified to assist in evaluating patient. EKG performed, rhythm Sinus Tach, 500 ml LR bolus given at this time as instructed by RR.Labs drawn, awaiting results at this time.

## 2017-08-09 NOTE — Progress Notes (Signed)
PT Cancellation Note  Patient Details Name: Wanda Baker MRN: [id number] DOB: 02-Jul-1960   Cancelled Treatment:     MD note says to keep patient on bedrest today. Will follow up later date for PT evaluation.   08/09/2017   Rande Lawman, PT   Loyal Buba 08/09/2017, 1:44 PM

## 2017-08-09 NOTE — Progress Notes (Signed)
OT Cancellation Note  Patient Details Name: Wanda Baker MRN: [id number] DOB: 1960-12-05   Cancelled Treatment:     MD note says to keep patient on bedrest today. Will follow up later date for OT evaluation.  [name] A Honor Fairbank 08/09/2017, 1:12 PM

## 2017-08-09 NOTE — Progress Notes (Signed)
Pt received via gurney from PACU. Arouses easily to verbal stimuli. Report received from PACU nurse. Honeycomb dressing to midline incision remains clean, dry and intact. JP drain to left side of abdomen intact with sersanquineous drainaged noted, dressing C/D/I, ileostomy to right side intact, no drainage noted at this time.

## 2017-08-09 NOTE — Progress Notes (Signed)
Lopressor administered as ordered. Blood pressure 100/70 with heart rate ranging from 106-120. Pt denies distress at this time. Responds easily to verbal stimuli, skin warm/dry, color pale. Will continue to monitor.

## 2017-08-09 NOTE — Progress Notes (Signed)
Dr. Brantley Stage aware via phone that pt HR continues to fluctuate from 120's to 140's at rest. Recently dangled pt with a brief jump of HR to 154 then back into 130's. Reported feeling "just UGH" with slight dizziness. Back to bed. Unable to stand. MD aware and recommended to just rest in bed today.

## 2017-08-09 NOTE — Significant Event (Addendum)
Rapid Response Event Note  Overview: Time Called: 2340 Arrival Time: 2345 Event Type: Cardiac, Other (Comment)(tachycardic)  Initial Focused Assessment:  Called to room by primary RN, due to patient being tachycardic in 130s with no fever or chest pain with rate increasing from when she arrived on floor at approx 2000 (now it is 0010).  When I arrived pt resting in bed, no obvious distress noted.  Pt A/Ox4. Does state that she has some mild back pain, but that she has a history of this and that it feels the same as her chronic pain.  Lungs sound clear with Sats 98% on 2L-[place], RR-20, with no labored breathing, HR-134 Sinus Tach, no ectopy noted. Strong pulses noted in all extremities with good movement and sensation.  Surgical site looks clean dry and intact.  JP drain with moderate serosanguinous drainage noted in bulb.  Foley catheter has thin bloody looking urine in collection bag.  RN advised that she did get two stents removed today, so this type of urine would be a normal find.  Medical record reviewed.  VS all stable with BP of 119/77.  12-Lead EKG result Sinus Tach, did not see any ST elevation or depression.  566ml NS IV bolus started, due to thinking possibly dry, but does have good urine output.  Labs drawn to check if she had a low H/H, ? Sepsis, or any other causative factors of why she would be tachycardic.  Hemoglobin did drop a little but still is 11.0.  Interventions:  12-Lead EKG, 567ml NS bolus, CBC, BMP, Lactic Acid  Plan of Care (if not transferred):  Pt in no distress with stable VS, Wait for labs to result, then call the on-call surgeon to get his input on if pt needs to be transferred to tele (to watch heart rate) or if current level of care is good, and how he would like to proceed with patient's current tachycardia.  Dr. Redmond Pulling notified of situation and current labs, esp. Lactic Acid of 6.5.  No further fluid orders given.  Order for Lopressor 5mg  IV given, which now has  been given, with a HR decrease down to 112 to 116 and BP remains stable at 135/85.  [place] [facility] Care Coordinator / Rapid Response Nurse

## 2017-08-09 NOTE — Progress Notes (Signed)
Pt with tachycardia as outlined in Dr Dois Davenport note Replace Mg Recheck EKG Blood loss 700 cc  Could be intravascularly depleted  Will bolus  1 L NS   Low dose beta blockade for now   Keep on bedrest   HCT ok   Recheck labs later today

## 2017-08-09 NOTE — Progress Notes (Signed)
Received call from lab regarding "Critical lab result of 3.9 on lactic acid", which is trending down from previous one drawn at midnight that resulted at 6.5.

## 2017-08-09 NOTE — Progress Notes (Signed)
1 Day Post-Op   Subjective/Chief Complaint: No n/v. No burping. Some discomfort. Waiting for nurse to help her get out of bed. Tachy since surgery   Objective: Vital signs in last 24 hours: Temp:  [97.5 F (36.4 C)-98.9 F (37.2 C)] 98.3 F (36.8 C) (02/23 0500) Pulse Rate:  [89-136] 126 (02/23 0500) Resp:  [13-19] 18 (02/23 0500) BP: (104-155)/(66-96) 111/70 (02/23 0500) SpO2:  [97 %-100 %] 97 % (02/23 0500) Weight:  [75.6 kg (166 lb 10.7 oz)] 75.6 kg (166 lb 10.7 oz) (02/22 2320)    Intake/Output from previous day: 02/22 0701 - 02/23 0700 In: 5059 [I.V.:4350; IV Piggyback:709] Out: 2605 [Urine:1775; Drains:130; Blood:700] Intake/Output this shift: No intake/output data recorded.  Resting comfortably, nontoxic cta Tachy Soft, some distension, approp mild TTP, drain -serosang No air in ostomy bag +SCDs Gross hematuria in foley  Lab Results:  Recent Labs    08/09/17 0002 08/09/17 0319  WBC 7.7 11.9*  HGB 11.0* 12.3  HCT 33.5* 36.4  PLT 230 282   BMET Recent Labs    08/09/17 0002 08/09/17 0319  NA 142 136  K 4.2 4.1  CL 104 102  CO2 24 22  GLUCOSE 146* 151*  BUN 9 10  CREATININE 1.08* 0.85  CALCIUM 8.1* 7.9*   PT/INR No results for input(s): LABPROT, INR in the last 72 hours. ABG No results for input(s): PHART, HCO3 in the last 72 hours.  Invalid input(s): PCO2, PO2  Studies/Results: Dg C-arm 1-60 Min-no Report  Result Date: 08/08/2017 Fluoroscopy was utilized by the requesting physician.  No radiographic interpretation.    Anti-infectives: Anti-infectives (From admission, onward)   Start     Dose/Rate Route Frequency Ordered Stop   08/08/17 1346  clindamycin (CLEOCIN) 900 MG/50ML IVPB    Comments:  Waldron Session   : cabinet override      08/08/17 1346 08/08/17 1352   08/08/17 0600  clindamycin (CLEOCIN) IVPB 900 mg     900 mg 100 mL/hr over 30 Minutes Intravenous 30 min pre-op 08/07/17 1325 08/08/17 1352   08/08/17 0600  gentamicin  (GARAMYCIN) 360 mg in dextrose 5 % 100 mL IVPB     360 mg 218 mL/hr over 30 Minutes Intravenous 30 min pre-op 08/07/17 1325 08/08/17 0815   08/08/17 0552  neomycin (MYCIFRADIN) tablet 1,000 mg  Status:  Discontinued     1,000 mg Oral 3 times per day 08/08/17 0552 08/08/17 1953   08/08/17 0552  metroNIDAZOLE (FLAGYL) tablet 1,000 mg  Status:  Discontinued     1,000 mg Oral 3 times per day 08/08/17 0552 08/08/17 1953      Assessment/Plan: s/p Procedure(s): EXPLORATORY LAPAROTOMY (N/A) HARTMANNS TAKEDOWN OF COLOSTOMY WITH COLORECTAL ANASTOMOIS ERAS PATHWAY (N/A) FLEXIBLE SIGMOIDOSCOPY (N/A) DIVERTIN  ILEOSTOMY (N/A) CYSTOSCOPY WITH URETERAL CATHETERS (N/A) LYSIS OF ADHESION SMALL BOWEL RESECTION x 2 POD 1 dr white  Will monitor HR - not ill appearing. hgb ok.  Oob, IS , pulm toilet hypomagnesia - replace magnesium Will let have sips of clears, ice chips Cont foley given hematuria - ?etiology - pt did have cysto and ureteral stents - monitor Cont chemical vte prophylaxis for now  Leighton Ruff. Redmond Pulling, MD, FACS General, Bariatric, & Minimally Invasive Surgery [facility], [place]    LOS: 1 day    Greer Pickerel 08/09/2017

## 2017-08-10 ENCOUNTER — Inpatient Hospital Stay (HOSPITAL_COMMUNITY): Payer: Medicaid Other

## 2017-08-10 LAB — BASIC METABOLIC PANEL
ANION GAP: 8 (ref 5–15)
BUN: 13 mg/dL (ref 6–20)
CALCIUM: 8.2 mg/dL — AB (ref 8.9–10.3)
CHLORIDE: 103 mmol/L (ref 101–111)
CO2: 28 mmol/L (ref 22–32)
Creatinine, Ser: 0.69 mg/dL (ref 0.44–1.00)
GFR calc Af Amer: >60 mL/min (ref >60)
GFR calc non Af Amer: >60 mL/min (ref >60)
GLUCOSE: 116 mg/dL — AB (ref 65–99)
Potassium: 4 mmol/L (ref 3.5–5.1)
Sodium: 139 mmol/L (ref 135–145)

## 2017-08-10 LAB — GLUCOSE, CAPILLARY
GLUCOSE-CAPILLARY: 99 mg/dL (ref 65–99)
GLUCOSE-CAPILLARY: 89 mg/dL (ref 65–99)
GLUCOSE-CAPILLARY: 95 mg/dL (ref 65–99)
Glucose-Capillary: 95 mg/dL (ref 65–99)

## 2017-08-10 LAB — CBC
HEMATOCRIT: 27.4 % — AB (ref 36.0–46.0)
HEMOGLOBIN: 9.1 g/dL — AB (ref 12.0–15.0)
MCH: 29.8 pg (ref 26.0–34.0)
MCHC: 33.2 g/dL (ref 30.0–36.0)
MCV: 89.8 fL (ref 78.0–100.0)
Platelets: 191 10*3/uL (ref 150–400)
RBC: 3.05 MIL/uL — ABNORMAL LOW (ref 3.87–5.11)
RDW: 13.4 % (ref 11.5–15.5)
WBC: 10.6 10*3/uL — AB (ref 4.0–10.5)

## 2017-08-10 LAB — PHOSPHORUS: Phosphorus: 2.5 mg/dL (ref 2.5–4.6)

## 2017-08-10 LAB — MAGNESIUM: Magnesium: 2.3 mg/dL (ref 1.7–2.4)

## 2017-08-10 MED ORDER — METOPROLOL TARTRATE 25 MG PO TABS
25.0000 mg | ORAL_TABLET | Freq: Two times a day (BID) | ORAL | Status: DC
  Administered 2017-08-10 (×2): 25 mg via ORAL
  Filled 2017-08-10: qty 1

## 2017-08-10 MED ORDER — IOPAMIDOL (ISOVUE-370) INJECTION 76%
INTRAVENOUS | Status: AC
  Administered 2017-08-10: 100 mL via INTRAVENOUS
  Filled 2017-08-10: qty 100

## 2017-08-10 MED ORDER — METOPROLOL TARTRATE 25 MG PO TABS
25.0000 mg | ORAL_TABLET | Freq: Two times a day (BID) | ORAL | Status: DC
  Filled 2017-08-10: qty 1

## 2017-08-10 MED ORDER — LACTATED RINGERS IV BOLUS (SEPSIS)
1000.0000 mL | Freq: Once | INTRAVENOUS | Status: AC
  Administered 2017-08-10: 1000 mL via INTRAVENOUS

## 2017-08-10 NOTE — Progress Notes (Signed)
Rt gave pt a flutter valve pre order. Pt knows and understands how to use.

## 2017-08-10 NOTE — Progress Notes (Signed)
PT Cancellation Note  Patient Details Name: Wanda Baker MRN: [id number] DOB: 12-Aug-1960   Cancelled Treatment:    Reason Eval/Treat Not Completed: Other (comment)(pt on bedrest, will await activity orders. )   Philomena Doheny 08/10/2017, 7:37 AM [phone]

## 2017-08-10 NOTE — Progress Notes (Signed)
2 Days Post-Op   Subjective/Chief Complaint: Some burping, no n/v with sips of liquids No air in bag Has persistent tachycarida; worsened with ambulation - up to 140s/150s ekg showed sinus tachy No oxygenation issues so far   Objective: Vital signs in last 24 hours: Temp:  [97.7 F (36.5 C)-98.7 F (37.1 C)] 98.7 F (37.1 C) (02/24 1011) Pulse Rate:  [107-136] 130 (02/24 1011) Resp:  [18-20] 18 (02/24 1011) BP: (97-143)/(58-69) 113/62 (02/24 1011) SpO2:  [94 %-99 %] 96 % (02/24 1011) Weight:  [80.1 kg (176 lb 9.4 oz)] 80.1 kg (176 lb 9.4 oz) (02/24 0441)    Intake/Output from previous day: 02/23 0701 - 02/24 0700 In: 3179.6 [P.O.:690; I.V.:2489.6] Out: 7591 [Urine:1725; Drains:60] Intake/Output this shift: Total I/O In: 120 [P.O.:120] Out: -   Alert, nad Cta, nonlabored, symm Tachy Soft, mild distension, no air in bag, serosang No edema Foley - hematuria improved but still light red with sediment  Lab Results:  Recent Labs    08/09/17 1526 08/10/17 0517  WBC 11.7* 10.6*  HGB 11.1* 9.1*  HCT 33.2* 27.4*  PLT 235 191   BMET Recent Labs    08/09/17 1526 08/10/17 0517  NA 135 139  K 3.9 4.0  CL 102 103  CO2 22 28  GLUCOSE 122* 116*  BUN 15 13  CREATININE 0.84 0.69  CALCIUM 8.0* 8.2*   PT/INR No results for input(s): LABPROT, INR in the last 72 hours. ABG No results for input(s): PHART, HCO3 in the last 72 hours.  Invalid input(s): PCO2, PO2  Studies/Results: No results found.  Anti-infectives: Anti-infectives (From admission, onward)   Start     Dose/Rate Route Frequency Ordered Stop   08/08/17 1346  clindamycin (CLEOCIN) 900 MG/50ML IVPB    Comments:  Waldron Session   : cabinet override      08/08/17 1346 08/08/17 1352   08/08/17 0600  clindamycin (CLEOCIN) IVPB 900 mg     900 mg 100 mL/hr over 30 Minutes Intravenous 30 min pre-op 08/07/17 1325 08/08/17 1352   08/08/17 0600  gentamicin (GARAMYCIN) 360 mg in dextrose 5 % 100 mL IVPB     360 mg 218 mL/hr over 30 Minutes Intravenous 30 min pre-op 08/07/17 1325 08/08/17 0815   08/08/17 0552  neomycin (MYCIFRADIN) tablet 1,000 mg  Status:  Discontinued     1,000 mg Oral 3 times per day 08/08/17 0552 08/08/17 1953   08/08/17 0552  metroNIDAZOLE (FLAGYL) tablet 1,000 mg  Status:  Discontinued     1,000 mg Oral 3 times per day 08/08/17 0552 08/08/17 1953      Assessment/Plan: s/p Procedure(s): EXPLORATORY LAPAROTOMY (N/A) HARTMANNS TAKEDOWN OF COLOSTOMY WITH COLORECTAL ANASTOMOIS ERAS PATHWAY (N/A) FLEXIBLE SIGMOIDOSCOPY (N/A) DIVERTIN  ILEOSTOMY (N/A) CYSTOSCOPY WITH URETERAL CATHETERS (N/A) LYSIS OF ADHESION SMALL BOWEL RESECTION x 2  Suspicion for dvt low  Anemia - hgb drifting down. Repeat cbc in am, maintain type and cross Clear liquid diet Increase lopressor to 25 bid Cont subcu heparin for vte prophylaxis - even though her hgb is drifting down, I will cont subcu heparin bc her risk of dvt is high. Cont scds Cont foley today due to hematuria - improving, hopefully remove in am Stop toradol due to worsening anemia  Wanda Ruff. Redmond Pulling, Wanda Baker, Wanda Baker General, Bariatric, & Minimally Invasive Surgery [facility], [place]   LOS: 2 days    Wanda Baker 08/10/2017

## 2017-08-10 NOTE — Progress Notes (Signed)
Talked with nurse Pt again became tachy to 140-150s when got up to chair, felt poor, Oxygen sat went down to 88% on 2l.   After sitting, vitals returned to premovement status  I don't think her anemia is source of tachy, mild desat on ambulation.   She on exam is not volume overloaded and doesn't appear overloaded on cxr.   Therefore my suspicion of PE is increased.  Suspicion for intra-abd issue is quite low.   Will check CT PE Cont aggressive pulm toilet Pt still stable for floor at this time  Leighton Wanda Baker. Redmond Pulling, MD, FACS General, Bariatric, & Minimally Invasive Surgery [facility], [place]

## 2017-08-11 ENCOUNTER — Encounter (HOSPITAL_COMMUNITY): Payer: Self-pay | Admitting: Surgery

## 2017-08-11 ENCOUNTER — Ambulatory Visit: Payer: Self-pay | Admitting: Surgery

## 2017-08-11 ENCOUNTER — Encounter: Payer: Self-pay | Admitting: Surgery

## 2017-08-11 ENCOUNTER — Inpatient Hospital Stay (HOSPITAL_COMMUNITY): Payer: Medicaid Other

## 2017-08-11 DIAGNOSIS — C20 Malignant neoplasm of rectum: Secondary | ICD-10-CM

## 2017-08-11 DIAGNOSIS — I10 Essential (primary) hypertension: Secondary | ICD-10-CM

## 2017-08-11 DIAGNOSIS — E119 Type 2 diabetes mellitus without complications: Secondary | ICD-10-CM

## 2017-08-11 DIAGNOSIS — R000 Tachycardia, unspecified: Secondary | ICD-10-CM

## 2017-08-11 DIAGNOSIS — Z932 Ileostomy status: Secondary | ICD-10-CM

## 2017-08-11 DIAGNOSIS — Z9889 Other specified postprocedural states: Secondary | ICD-10-CM

## 2017-08-11 LAB — BASIC METABOLIC PANEL
Anion gap: 12 (ref 5–15)
BUN: 14 mg/dL (ref 6–20)
CHLORIDE: 103 mmol/L (ref 101–111)
CO2: 22 mmol/L (ref 22–32)
Calcium: 8.5 mg/dL — ABNORMAL LOW (ref 8.9–10.3)
Creatinine, Ser: 0.69 mg/dL (ref 0.44–1.00)
GFR calc Af Amer: >60 mL/min (ref >60)
GFR calc non Af Amer: >60 mL/min (ref >60)
GLUCOSE: 109 mg/dL — AB (ref 65–99)
POTASSIUM: 4.1 mmol/L (ref 3.5–5.1)
Sodium: 137 mmol/L (ref 135–145)

## 2017-08-11 LAB — CBC
HEMATOCRIT: 27 % — AB (ref 36.0–46.0)
HEMOGLOBIN: 8.9 g/dL — AB (ref 12.0–15.0)
MCH: 28.8 pg (ref 26.0–34.0)
MCHC: 33 g/dL (ref 30.0–36.0)
MCV: 87.4 fL (ref 78.0–100.0)
Platelets: 209 10*3/uL (ref 150–400)
RBC: 3.09 MIL/uL — AB (ref 3.87–5.11)
RDW: 13.1 % (ref 11.5–15.5)
WBC: 12.2 10*3/uL — ABNORMAL HIGH (ref 4.0–10.5)

## 2017-08-11 LAB — GLUCOSE, CAPILLARY
GLUCOSE-CAPILLARY: 90 mg/dL (ref 65–99)
GLUCOSE-CAPILLARY: 83 mg/dL (ref 65–99)
Glucose-Capillary: 102 mg/dL — ABNORMAL HIGH (ref 65–99)
Glucose-Capillary: 90 mg/dL (ref 65–99)
Glucose-Capillary: 97 mg/dL (ref 65–99)

## 2017-08-11 LAB — MAGNESIUM: MAGNESIUM: 2 mg/dL (ref 1.7–2.4)

## 2017-08-11 LAB — TSH: TSH: 0.249 u[IU]/mL — AB (ref 0.350–4.500)

## 2017-08-11 LAB — LACTIC ACID, PLASMA
LACTIC ACID, VENOUS: 0.8 mmol/L (ref 0.5–1.9)
LACTIC ACID, VENOUS: 0.9 mmol/L (ref 0.5–1.9)

## 2017-08-11 LAB — T4, FREE: FREE T4: 0.9 ng/dL (ref 0.61–1.12)

## 2017-08-11 LAB — PHOSPHORUS: Phosphorus: 2.7 mg/dL (ref 2.5–4.6)

## 2017-08-11 MED ORDER — IOPAMIDOL (ISOVUE-300) INJECTION 61%
15.0000 mL | Freq: Once | INTRAVENOUS | Status: DC | PRN
  Administered 2017-08-11: 15 mL via ORAL
  Filled 2017-08-11: qty 30

## 2017-08-11 MED ORDER — IOPAMIDOL (ISOVUE-300) INJECTION 61%
INTRAVENOUS | Status: AC
Start: 2017-08-11 — End: 2017-08-11
  Administered 2017-08-11: 15 mL
  Filled 2017-08-11: qty 30

## 2017-08-11 MED ORDER — SODIUM CHLORIDE 0.9 % IV BOLUS (SEPSIS)
1000.0000 mL | Freq: Once | INTRAVENOUS | Status: AC
  Administered 2017-08-11: 1000 mL via INTRAVENOUS

## 2017-08-11 MED ORDER — PROMETHAZINE HCL 25 MG/ML IJ SOLN
6.2500 mg | Freq: Four times a day (QID) | INTRAMUSCULAR | Status: DC | PRN
  Administered 2017-08-11 – 2017-08-13 (×4): 6.25 mg via INTRAVENOUS
  Filled 2017-08-11 (×4): qty 1

## 2017-08-11 MED ORDER — IOPAMIDOL (ISOVUE-300) INJECTION 61%
100.0000 mL | Freq: Once | INTRAVENOUS | Status: AC | PRN
  Administered 2017-08-11: 100 mL via INTRAVENOUS

## 2017-08-11 MED ORDER — IOPAMIDOL (ISOVUE-300) INJECTION 61%
INTRAVENOUS | Status: AC
  Filled 2017-08-11: qty 100

## 2017-08-11 MED ORDER — KETOROLAC TROMETHAMINE 15 MG/ML IJ SOLN
15.0000 mg | Freq: Four times a day (QID) | INTRAMUSCULAR | Status: DC
  Administered 2017-08-11 – 2017-08-12 (×4): 15 mg via INTRAVENOUS
  Filled 2017-08-11 (×4): qty 1

## 2017-08-11 MED ORDER — METOPROLOL TARTRATE 50 MG PO TABS
50.0000 mg | ORAL_TABLET | Freq: Two times a day (BID) | ORAL | Status: DC
  Administered 2017-08-11 – 2017-08-15 (×9): 50 mg via ORAL
  Filled 2017-08-11 (×9): qty 1

## 2017-08-11 NOTE — Progress Notes (Signed)
PT Cancellation Note  Patient Details Name: ONEAL BIGLOW MRN: [id number] DOB: 25-Jul-1960   Cancelled Treatment:    Reason Eval/Treat Not Completed: Patient at procedure or test/unavailable(pt off unit. Pt still has bedrest orders. MD -please update activity orders when appropriate. Will follow. )   Philomena Doheny 08/11/2017, 1:01 PM [phone]

## 2017-08-11 NOTE — Progress Notes (Signed)
OT Cancellation Note  Patient Details Name: Wanda Baker MRN: [id number] DOB: [birth year]   Cancelled Treatment:    Reason Eval/Treat Not Completed: Patient at procedure or test/ unavailable  Please update activity orders.   Thanks, Kari Baars, [place] Payton Mccallum D 08/11/2017, 1:00 PM

## 2017-08-11 NOTE — Consult Note (Signed)
History and Physical    IRISHA Baker JTT:[id number] DOB: Dec 18, 1960 DOA: 08/08/2017  PCP: Tonette Bihari, MD  Reason for consult: Persistent tachycardia Consulting team: General surgery  HPI: Wanda Baker is a 57 y.o. female with medical history significant of adenocarcinoma of the colon status post exploratory lap, small bowel resection, adhesiolysis, Hartmann's takedown with stapled low colorectal anastomosis on 08/08/2017 she is postop day 3 today.  Patient seems to be having a routine postoperative course.  She says that her ostomy started to output a lot today.  She denies any fevers or cough.  She has been persistently tachycardic with sometimes heart rate up into the 150s over the weekend since her surgery.  She says her pain is okay currently a 6 out of 10.  She has been getting up to the chair over the weekend.  She denies any vomiting.  She denies any shortness of breath or chest pain.  She has a history of hypertension however she is not on any blood pressure medications chronically at home.  She does not chronically take a beta-blocker.  She has not been eating and drinking very well.  She denies any history of any tachyarrhythmias.  Patient had a CTA of her chest yesterday which was negative for PE or any developing pneumonia.  She also had a CT of her abdomen and pelvis today which did show some bilateral pleural effusions with no other acute intra-abdominal process going on.  Her hemoglobin has been stable around 9.  Patient is being referred for consultation due to her persistent tachycardia of unclear etiology.  She was started on a beta-blocker this morning.  Heart rate now is less than 110.  Review of Systems: As per HPI otherwise 10 point review of systems negative.   Past Medical History:  Diagnosis Date  . Adenocarcinoma of colon ([place]) 09/23/2016  . Allergy    uses Nasocort daily and takes Singulair nightly   . Arthritis   . Chronic back pain    buldging disc  .  Diabetes mellitus without complication (HCC)    taikes Metformin daily  . GERD (gastroesophageal reflux disease)    takes Omeprazole daily   . History of bronchitis    many yrs ago  . Hyperlipidemia    takes Atorvastatin daily  . Hypertension    takes Lisinopril daily as well HCTZ  . Weakness    left arm    Past Surgical History:  Procedure Laterality Date  . ABDOMINAL HYSTERECTOMY  2011   Supracervical Hysterectomy  . ANTERIOR CERVICAL DECOMP/DISCECTOMY FUSION N/A 12/02/2013   Procedure: ANTERIOR CERVICAL DECOMPRESSION/DISCECTOMY FUSION 3 LEVELS;  Surgeon: Erline Levine, MD;  Location: [facility];  Service: Neurosurgery;  Laterality: N/A;  C4-5 C5-6 C6-7 Anterior cervical decompression/diskectomy/fusion  . BOWEL RESECTION N/A 09/18/2016   Procedure: LOW ANTERIOR COLON RESECTION WITH COLOSTOMY;  Surgeon: Fanny Skates, MD;  Location: [facility];  Service: General;  Laterality: N/A;  . BOWEL RESECTION  08/08/2017   Procedure: SMALL BOWEL RESECTION x 2;  Surgeon: Ileana Roup, MD;  Location: [facility];  Service: General;;  . CESAREAN SECTION  86/88  . COLOSTOMY TAKEDOWN N/A 08/08/2017   Procedure: HARTMANNS TAKEDOWN OF COLOSTOMY WITH COLORECTAL ANASTOMOIS ERAS PATHWAY;  Surgeon: Ileana Roup, MD;  Location: [facility];  Service: General;  Laterality: N/A;  . CYSTOSCOPY W/ URETERAL STENT PLACEMENT N/A 08/08/2017   Procedure: CYSTOSCOPY WITH URETERAL CATHETERS;  Surgeon: Ardis Hughs, MD;  Location: [facility];  Service:  Urology;  Laterality: N/A;  . FLEXIBLE SIGMOIDOSCOPY N/A 09/17/2016   Procedure: FLEXIBLE SIGMOIDOSCOPY;  Surgeon: Mauri Pole, MD;  Location: [facility];  Service: Endoscopy;  Laterality: N/A;  . FLEXIBLE SIGMOIDOSCOPY N/A 08/08/2017   Procedure: FLEXIBLE SIGMOIDOSCOPY;  Surgeon: Ileana Roup, MD;  Location: [facility];  Service: General;  Laterality: N/A;  . ILEOSTOMY N/A 08/08/2017   Procedure: Ladon Applebaum  ILEOSTOMY;  Surgeon: Ileana Roup, MD;   Location: [facility];  Service: General;  Laterality: N/A;  . LAPAROTOMY N/A 08/08/2017   Procedure: EXPLORATORY LAPAROTOMY;  Surgeon: Ileana Roup, MD;  Location: [facility];  Service: General;  Laterality: N/A;  . LYSIS OF ADHESION  08/08/2017   Procedure: LYSIS OF ADHESION;  Surgeon: Ileana Roup, MD;  Location: [facility];  Service: General;;  . TUBAL LIGATION       reports that she has quit smoking. she has never used smokeless tobacco. She reports that she does not drink alcohol or use drugs.  Allergies  Allergen Reactions  . Penicillins Swelling and Rash    Has patient had a PCN reaction causing immediate rash, facial/tongue/throat swelling, SOB or lightheadedness with hypotension: Yes Has patient had a PCN reaction causing severe rash involving mucus membranes or skin necrosis: No Has patient had a PCN reaction that required hospitalization No Has patient had a PCN reaction occurring within the last 10 years: No If all of the above answers are "NO", then may proceed with Cephalosporin use.   . Prednisone Rash  . Vicodin [Hydrocodone-Acetaminophen] Palpitations and Other (See Comments)    dizziness    Family History  Problem Relation Age of Onset  . Heart disease Maternal Grandmother   . Diabetes Maternal Grandmother   . Hypertension Maternal Grandmother   . Heart disease Maternal Grandfather   . Heart disease Paternal Grandmother   . Diabetes Paternal Grandmother   . Alzheimer's disease Paternal Grandmother     Prior to Admission medications   Medication Sig Start Date End Date Taking? Authorizing Provider  acetaminophen (TYLENOL 8 HOUR ARTHRITIS PAIN) 650 MG CR tablet Take 1,300 mg by mouth every 8 (eight) hours as needed for pain.   Yes [provider]  cetirizine (ZYRTEC) 10 MG tablet Take 10 mg by mouth every evening.    Yes [provider]  gabapentin (NEURONTIN) 300 MG capsule Take 1 capsule (300 mg total) by mouth 3 (three) times daily. 10/17/16   Yes Mikell, Jeani Sow, MD  saccharomyces boulardii (FLORASTOR) 250 MG capsule Take 1 capsule (250 mg total) by mouth 2 (two) times daily. 09/25/16  Yes [place] Bing, DO    Physical Exam: Vitals:   08/10/17 2149 08/11/17 0500 08/11/17 0607 08/11/17 0903  BP: 136/68  (!) 144/75 132/71  Pulse: (!) 124  (!) 122 (!) 105  Resp: 18  18 20   Temp: 98 F (36.7 C)  98.2 F (36.8 C) 97.9 F (36.6 C)  TempSrc: Oral  Oral Oral  SpO2: 94%  95% 92%  Weight:  80.1 kg (176 lb 9.4 oz)    Height:          Constitutional: NAD, calm, comfortable Vitals:   08/10/17 2149 08/11/17 0500 08/11/17 0607 08/11/17 0903  BP: 136/68  (!) 144/75 132/71  Pulse: (!) 124  (!) 122 (!) 105  Resp: 18  18 20   Temp: 98 F (36.7 C)  98.2 F (36.8 C) 97.9 F (36.6 C)  TempSrc: Oral  Oral Oral  SpO2: 94%  95%  92%  Weight:  80.1 kg (176 lb 9.4 oz)    Height:       Eyes: PERRL, lids and conjunctivae normal ENMT: Mucous membranes are moist. Posterior pharynx clear of any exudate or lesions.Normal dentition.  Neck: normal, supple, no masses, no thyromegaly Respiratory: clear to auscultation bilaterally, no wheezing, no crackles. Normal respiratory effort. No accessory muscle use.  Cardiovascular: Regular rate and rhythm, no murmurs / rubs / gallops. No extremity edema. 2+ pedal pulses. No carotid bruits.  Abdomen: no tenderness, no masses palpated. No hepatosplenomegaly. Bowel sounds positive.  Musculoskeletal: no clubbing / cyanosis. No joint deformity upper and lower extremities. Good ROM, no contractures. Normal muscle tone.  Skin: no rashes, lesions, ulcers. No induration Neurologic: CN 2-12 grossly intact. Sensation intact, DTR normal. Strength 5/5 in all 4.  Psychiatric: Normal judgment and insight. Alert and oriented x 3. Normal mood.    Labs on Admission: I have personally reviewed following labs and imaging studies  CBC: Recent Labs  Lab 08/09/17 0002 08/09/17 0319 08/09/17 1526  08/10/17 0517 08/11/17 0432  WBC 7.7 11.9* 11.7* 10.6* 12.2*  HGB 11.0* 12.3 11.1* 9.1* 8.9*  HCT 33.5* 36.4 33.2* 27.4* 27.0*  MCV 88.2 88.6 88.3 89.8 87.4  PLT 230 282 235 191 417   Basic Metabolic Panel: Recent Labs  Lab 08/09/17 0002 08/09/17 0319 08/09/17 1526 08/10/17 0517 08/11/17 0432  NA 142 136 135 139 137  K 4.2 4.1 3.9 4.0 4.1  CL 104 102 102 103 103  CO2 24 22 22 28 22   GLUCOSE 146* 151* 122* 116* 109*  BUN 9 10 15 13 14   CREATININE 1.08* 0.85 0.84 0.69 0.69  CALCIUM 8.1* 7.9* 8.0* 8.2* 8.5*  MG  --  1.2* 2.6* 2.3 2.0  PHOS  --  4.0  --  2.5 2.7   GFR: Estimated Creatinine Clearance: 84.5 mL/min (by C-G formula based on SCr of 0.69 mg/dL). Liver Function Tests: Recent Labs  Lab 08/09/17 1526  AST 27  ALT 19  ALKPHOS 41  BILITOT 1.3*  PROT 5.2*  ALBUMIN 2.8*   No results for input(s): LIPASE, AMYLASE in the last 168 hours. No results for input(s): AMMONIA in the last 168 hours. Coagulation Profile: No results for input(s): INR, PROTIME in the last 168 hours. Cardiac Enzymes: No results for input(s): CKTOTAL, CKMB, CKMBINDEX, TROPONINI in the last 168 hours. BNP (last 3 results) No results for input(s): PROBNP in the last 8760 hours. HbA1C: No results for input(s): HGBA1C in the last 72 hours. CBG: Recent Labs  Lab 08/10/17 2033 08/10/17 2327 08/11/17 0345 08/11/17 0712 08/11/17 1205  GLUCAP 89 95 102* 90 97   Lipid Profile: No results for input(s): CHOL, HDL, LDLCALC, TRIG, CHOLHDL, LDLDIRECT in the last 72 hours. Thyroid Function Tests: No results for input(s): TSH, T4TOTAL, FREET4, T3FREE, THYROIDAB in the last 72 hours. Anemia Panel: No results for input(s): VITAMINB12, FOLATE, FERRITIN, TIBC, IRON, RETICCTPCT in the last 72 hours. Urine analysis:    Component Value Date/Time   COLORURINE YELLOW 09/16/2016 1009   APPEARANCEUR CLEAR 09/16/2016 1009   LABSPEC >1.046 (H) 09/16/2016 1009   PHURINE 6.0 09/16/2016 1009   GLUCOSEU 50  (A) 09/16/2016 1009   HGBUR NEGATIVE 09/16/2016 1009   HGBUR small 04/28/2009 1049   BILIRUBINUR NEGATIVE 09/16/2016 1009   BILIRUBINUR NEG 09/21/2015 1502   KETONESUR NEGATIVE 09/16/2016 1009   PROTEINUR NEGATIVE 09/16/2016 1009   UROBILINOGEN 0.2 09/21/2015 1502   UROBILINOGEN 0.2 04/28/2009 1049  NITRITE NEGATIVE 09/16/2016 1009   LEUKOCYTESUR NEGATIVE 09/16/2016 1009   Sepsis Labs: !!!!!!!!!!!!!!!!!!!!!!!!!!!!!!!!!!!!!!!!!!!! @LABRCNTIP (procalcitonin:4,lacticidven:4) )No results found for this or any previous visit (from the past 240 hour(s)).   Radiological Exams on Admission: Ct Angio Chest Pe W Or Wo Contrast  Result Date: 08/10/2017 CLINICAL DATA:  Tachycardia and hypoxia. EXAM: CT ANGIOGRAPHY CHEST WITH CONTRAST TECHNIQUE: Multidetector CT imaging of the chest was performed using the standard protocol during bolus administration of intravenous contrast. Multiplanar CT image reconstructions and MIPs were obtained to evaluate the vascular anatomy. CONTRAST:  100 mL Isovue 370 COMPARISON:  Chest radiograph 08/10/2017 FINDINGS: Cardiovascular: Contrast injection is sufficient to demonstrate satisfactory opacification of the pulmonary arteries to the segmental level. There is no pulmonary embolus. The main pulmonary artery is within normal limits for size. There is a normal 3-vessel arch branching pattern without evidence of acute aortic syndrome. There is noaortic atherosclerosis. Heart size is normal, without pericardial effusion. Coronary artery calcification. Mediastinum/Nodes: 11 mm left thyroid nodule. Normal course of the thoracic esophagus. No mediastinal or axillary lymphadenopathy. Lungs/Pleura: Small pleural effusions with bibasilar atelectasis or consolidation. Upper Abdomen: Contrast bolus timing is not optimized for evaluation of the abdominal organs. Hepatic steatosis. Musculoskeletal: No chest wall abnormality. No acute or significant osseous findings. Review of the MIP images  confirms the above findings. IMPRESSION: 1. No pulmonary embolus. 2. Small pleural effusions with associated atelectasis or consolidation. Electronically Signed   By: Ulyses Jarred M.D.   On: 08/10/2017 18:28   Ct Abdomen Pelvis W Contrast  Result Date: 08/11/2017 CLINICAL DATA:  Abdominal distension. EXAM: CT ABDOMEN AND PELVIS WITH CONTRAST TECHNIQUE: Multidetector CT imaging of the abdomen and pelvis was performed using the standard protocol following bolus administration of intravenous contrast. CONTRAST:  65mL ISOVUE-300 IOPAMIDOL (ISOVUE-300) INJECTION 61% orally, 119mL ISOVUE-300 IOPAMIDOL (ISOVUE-300) INJECTION 61% intravenously. COMPARISON:  CT scan of September 16, 2016. FINDINGS: Lower chest: Mild bilateral pleural effusions are noted with adjacent subsegmental atelectasis. Hepatobiliary: No gallstones are noted. Fatty infiltration of the liver is noted. Pancreas: Unremarkable. No pancreatic ductal dilatation or surrounding inflammatory changes. Spleen: Normal in size without focal abnormality. Adrenals/Urinary Tract: Adrenal glands appear normal. Left kidney and ureter are unremarkable. Mild right hydroureteronephrosis is noted without evidence of obstructive calculus. Urinary bladder is decompressed secondary to Foley catheter. Stomach/Bowel: Postsurgical changes are seen in the sigmoid rectum region. Ileostomy is noted in the right lower quadrant. Mildly dilated small bowel loops are noted which may represent postoperative ileus. Vascular/Lymphatic: Aortic atherosclerosis. No enlarged abdominal or pelvic lymph nodes. Reproductive: Status post hysterectomy. No adnexal masses. Other: Surgical drain is seen involving the left lower quadrant of the abdomen with distal tip in the pelvis. Mild anasarca is noted. Old colostomy site is seen in the left lower quadrant site. Minimal amount of free fluid is noted in the pelvis and pericolic gutters. Musculoskeletal: No acute or significant osseous findings.  IMPRESSION: Mild bilateral pleural effusions with adjacent subsegmental atelectasis. Postsurgical changes are seen in the rectosigmoid region. Ileostomy is noted in right lower quadrant. Old colostomy site is seen in the left lower quadrant. Mildly dilated small bowel loops are noted which most likely represent postoperative ileus. Fatty infiltration of the liver. Aortic Atherosclerosis (ICD10-I70.0). Electronically Signed   By: Marijo Conception, M.D.   On: 08/11/2017 14:12   Dg Chest Port 1 View  Result Date: 08/10/2017 CLINICAL DATA:  Tachycardia EXAM: PORTABLE CHEST 1 VIEW COMPARISON:  09/17/2016 chest radiograph. FINDINGS: Surgical hardware from ACDF overlies the lower  cervical spine. Stable cardiomediastinal silhouette with top-normal heart size. No pneumothorax. Possible small left pleural effusion. No right pleural effusion. Low lung volumes. No overt pulmonary edema. Mild bibasilar opacities, favor atelectasis. IMPRESSION: 1. Low lung volumes with mild bibasilar opacities, favor atelectasis. 2. Possible small left pleural effusion. Electronically Signed   By: Ilona Sorrel M.D.   On: 08/10/2017 11:13    Old chart reviewed  Case discussed with general surgeon Dr. Dema Severin  Twelve-lead EKGs reviewed sinus tachycardia with no acute changes  Assessment/Plan 57 year old female who is postop day 3 colon resection with persistent sinus tachycardia Principal Problem:   Sinus tachycardia-patient is afebrile with a white count of 12 from 10 yesterday.  Her review of systems are grossly negative except for routine postoperative pain and nausea.  Agree with checking thyroid levels.  She did have a lactic acid level over 6 on Friday after 2 L of fluid this had come down to 3.9.  We will repeat this to make sure that it is normalized.  Will give patient another liter of fluid.  Will transfer patient to telemetry floor so we can monitor her rhythm more closely.  Monitor closely for any development of fever or any  other signs suggestive of infection.  At this time she does have atelectasis on imaging will provide more aggressive pulmonary toilet.  We will continue to follow along with you daily.  Active Problems:   HYPERTENSION, BENIGN ESSENTIAL-has been off medications for months now prior to this hospitalization  diabetes type 2, controlled (HCC)-normally diet controlled   Anemia of chronic disease-hemoglobin around 9   Rectal cancer (HCC)-per general surgery   Please feel free to call with any questions if necessary.    DVT prophylaxis: SCDs Code Status: Full  Family Communication: Mother Disposition Plan: Per general surgery team   Parish Augustine A MD [facility]  If 7PM-7AM, please contact night-coverage [email or web address] Password [facility]  08/11/2017, 2:19 PM

## 2017-08-11 NOTE — Progress Notes (Addendum)
Subjective Persistent tachycardia since PACU - was noted in the OR as well; afebrile, normotensive. EKG showed sinus tachycardia. Excellent UOP. Cr normal. Drain output reassuring - 55cc/24hrs serosang. CTA chest yesterday showed no PE. Hgb ~stable for last 24hrs - 8.9 from 9.1. She has had some nausea but no emesis. Tolerating clears. Stoma with some output this morning and additional after exam per our wound ostomy nurse. She has been ambulating and up to chair with assistance.  Objective: Vital signs in last 24 hours: Temp:  [97.9 F (36.6 C)-98.9 F (37.2 C)] 97.9 F (36.6 C) (02/25 0903) Pulse Rate:  [105-130] 105 (02/25 0903) Resp:  [18-20] 20 (02/25 0903) BP: (104-144)/(62-75) 132/71 (02/25 0903) SpO2:  [92 %-97 %] 92 % (02/25 0903) Weight:  [80.1 kg (176 lb 9.4 oz)] 80.1 kg (176 lb 9.4 oz) (02/25 0500)     Intake/Output from previous day: 02/24 0701 - 02/25 0700 In: 4980 [P.O.:480; I.V.:4500] Out: 3155 [Urine:3100; Drains:55] Intake/Output this shift: No intake/output data recorded.  Gen: NAD, comfortable CV: RRR Pulm: Normal work of breathing Abd: Soft, no tenderness on exam; mildly distended; incision c/d/i without erythema; stoma pink, patent and productive this morning. Distal limb/mucuous fistula visible in stoma as well - no evidence of retraction. Colostomy site packing removed; replaced with wick of moist 4x4 gauze. Change daily. JP with serosang output in bulb. Ext: SCDs in place  Lab Results: CBC  Recent Labs    08/10/17 0517 08/11/17 0432  WBC 10.6* 12.2*  HGB 9.1* 8.9*  HCT 27.4* 27.0*  PLT 191 209   BMET Recent Labs    08/10/17 0517 08/11/17 0432  NA 139 137  K 4.0 4.1  CL 103 103  CO2 28 22  GLUCOSE 116* 109*  BUN 13 14  CREATININE 0.69 0.69  CALCIUM 8.2* 8.5*    Anti-infectives: Anti-infectives (From admission, onward)   Start     Dose/Rate Route Frequency Ordered Stop   08/08/17 1346  clindamycin (CLEOCIN) 900 MG/50ML IVPB     Comments:  Whitlow, Cheryl   : cabinet override      08/08/17 1346 08/08/17 1352   08/08/17 0600  clindamycin (CLEOCIN) IVPB 900 mg     900 mg 100 mL/hr over 30 Minutes Intravenous 30 min pre-op 08/07/17 1325 08/08/17 1352   08/08/17 0600  gentamicin (GARAMYCIN) 360 mg in dextrose 5 % 100 mL IVPB     360 mg 218 mL/hr over 30 Minutes Intravenous 30 min pre-op 08/07/17 1325 08/08/17 0815   08/08/17 0552  neomycin (MYCIFRADIN) tablet 1,000 mg  Status:  Discontinued     1,000 mg Oral 3 times per day 08/08/17 0552 08/08/17 1953   08/08/17 0552  metroNIDAZOLE (FLAGYL) tablet 1,000 mg  Status:  Discontinued     1,000 mg Oral 3 times per day 08/08/17 0552 08/08/17 1953       Assessment/Plan: Patient Active Problem List   Diagnosis Date Noted  . Physical deconditioning 10/02/2016  . Rectal cancer ([place]) 10/01/2016  . Anemia of chronic disease 09/27/2016  . Cervical radiculopathy due to degenerative joint disease of spine 02/19/2013  . Diabetes type 2, controlled ([place]) 12/03/2011  . HYPERTENSION, BENIGN ESSENTIAL 11/20/2007  . GASTROESOPHAGEAL REFLUX, NO ESOPHAGITIS 08/14/2006   s/p Procedure(s): Ms. Loud is s/p exploratory laparotomy, adhesiolysis, small bowel resection, Hartmann's takedown with stapled low colorectal anastomosis (stump 6-7cm from verge), flex sig and cysto/stents 08/08/17 (POD#3)  -CT A/P today to ensure that there is nothing intra-abdominally contributing/causing her tachycardia -  if still no identifiable source, will plan to consult with medicine for further assistance. Continue metoprolol -50mg  BID at this time -Continue tylenol, tramadol, gabapentin, dilaudid prn; adding back toradol to wean dilaudid off -WOCN seeing patient today for assistance in stoma care; appears to have started putting out this morning. Continue clear liquids today; possible full liquids/diet tomorrow -D/C foley -PPx: SQH, SCDs -Dispo: Inpatient until tolerating diet, stoma output and  controlled rate, pain controlled on oral analgesics and etiology/tachycardia addressed -I explained to the patient and her family that I will be leaving town this week and my partner Dr. Johney Maine will be caring for her in my absence    LOS: 3 days   Sharon Mt. Dema Severin, M.D. General and Colorectal Surgery [facility]

## 2017-08-11 NOTE — Consult Note (Signed)
[facility] Nurse ostomy consult note Stoma type/location: RUQ Ileostomy with takedown of colostomy in LLQ.  Patient was independent in caring for her colostomy.  We review the differences in colsotomy vs ileostomy. Location in bowel diversion, difference in effluent.  She will need to empty more frequently and may have to change pouch more than twice weekly, what she was accustomed to with her colostomy,.  Stomal assessment/size: 1 1/2" pink patent and producing liquid brown effluent.  Audible flatulence noted today.  Peristomal assessment: intact Treatment options for stomal/peristomal skin: barrier ring and convex pouch due to body habitus. Output liquid brown effluent Ostomy pouching: 1pc.convex with barrier ring.  She was accustomed to using a barrier ring and flat pouching system with her colostomy. I explained rationale for convexity and she understands and agrees.  Education provided: Network engineer provided.  She understands to empty when 1/3 full and has lived with an ostomy for awhile. She indicates she has no questions.  She is nauseated this morning and doesn't feel her best.  Enrolled patient in [place] program: No Woc team will follow and remain available to patient, medical and nursing teams.  Domenic Moras RN BSN [place] Pager [phone]

## 2017-08-11 NOTE — H&P (Deleted)
[place] Documented: 08/11/2017 2:29 PM Location: [facility] Patient #: [id number] DOB: 01/18/1951 Widowed / Language: [name] / Race: Black or African American Female  History of Present Illness [name] MD; 08/11/2017 3:04 PM) The patient is a 57 year old female who presents with hemorrhoids. Note for "Hemorrhoids": ` ` ` Patient sent for surgical consultation at the request of Dr. Harlan Stains  Chief Complaint: Worsening hemorrhoids.  The patient is a woman that has had hemorrhoid surgery in the past is occasionally struggled with anal pain presumably hemorrhoids. Relocated from [place]. Has struggled with some hemorrhoid issues for the past decade. Recalls having some type of hemorrhoid surgery in 2013. She notes that she gets intermittent bleeding and feels a lump around her anus when she wipes. Has been trying to manage it with baking soda soaks. No sharp pains but irritating when she wipes. Annoying and frustrating. She wish to have something done. Surgical consultation requested. She usually moves her bowels twice a day. Some mild lactose intolerance. She's never had a colonoscopy. She claims her fecal occult bloods were normal back in [place]. She smokes a few cigarettes a day but not heavy smoker. She had her tubes tied but no other abdominal surgeries. She does get short of breath after walking about 15-20 minutes but no exertional chest pain. No history of prior heart attack or stroke.  No personal nor family history of GI/colon cancer, inflammatory bowel disease, irritable bowel syndrome, allergy such as Celiac Sprue, dietary/dairy problems, colitis, ulcers nor gastritis. No recent sick contacts/gastroenteritis. No travel outside the country. No changes in diet. No dysphagia to solids or liquids. No significant heartburn or reflux. No hematochezia, hematemesis, coffee ground emesis. No evidence of prior gastric/peptic  ulceration.  (Review of systems as stated in this history (HPI) or in the review of systems. Otherwise all other 12 point ROS are negative)   Past Surgical History [place], [place]; 08/11/2017 2:30 PM) Breast Biopsy Right. multiple Hemorrhoidectomy Spinal Surgery - Lower Back  Diagnostic Studies History [place], CMA; 08/11/2017 2:30 PM) Colonoscopy never Mammogram within last year Pap Smear 1-5 years ago  Allergies [place], [place]; 08/11/2017 2:30 PM) Penicillins Allergies Reconciled  Medication History [place], CMA; 08/11/2017 2:33 PM) Albuterol Sulfate ((2.5 MG/3ML)0.083% Nebulized Soln, Inhalation) Active. AmLODIPine Besylate (5MG  Tablet, Oral) Active. Azopt (1% Suspension, Ophthalmic) Active. Hydrocodone-Acetaminophen (Oral) Specific strength unknown - Active. Hydroxychloroquine Sulfate (200MG  Tablet, Oral) Active. Symbicort (160-4.5MCG/ACT Aerosol, Inhalation) Active. Triamcinolone Acetonide (0.1% Cream, External) Active. Tylenol Extra Strength (500MG  Tablet, Oral) Active. Vitamin B-12 (1000MCG Tablet, Oral) Active. 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 Caffeine use Tea. No drug use Tobacco use Current some day smoker.  Family History [place], [place]; 08/11/2017 2:30 PM) Cerebrovascular Accident Sister. Kidney Disease Daughter.  Pregnancy / Birth History [place], [place]; 08/11/2017 2:30 PM) Age at menarche 74 years. Age of menopause 50-55 Contraceptive History Oral contraceptives. Gravida 3 Para 3  Other Problems [place], CMA; 08/11/2017 2:30 PM) Arthritis Back Pain Chronic Obstructive Lung Disease Hemorrhoids High blood pressure     Review of Systems (Danielle Gerrigner CMA; 08/11/2017 2:30 PM) General Present- Night Sweats and Weight Gain. Not Present- Appetite Loss, Chills,  Fatigue, Fever and Weight Loss. Skin Present- Dryness. Not Present- Change in Wart/Mole, Hives, Jaundice, New Lesions, Non-Healing Wounds, Rash and Ulcer. HEENT Present- Nose Bleed and Wears glasses/contact lenses. Not Present- Earache, Hearing Loss, Hoarseness, Oral Ulcers, Ringing  in the Ears, Seasonal Allergies, Sinus Pain, Sore Throat, Visual Disturbances and Yellow Eyes. Breast Not Present- Breast Mass, Breast Pain, Nipple Discharge and Skin Changes. Cardiovascular Present- Leg Cramps, Shortness of Breath and Swelling of Extremities. Not Present- Chest Pain, Difficulty Breathing Lying Down, Palpitations and Rapid Heart Rate. Gastrointestinal Present- Bloating and Hemorrhoids. Not Present- Abdominal Pain, Bloody Stool, Change in Bowel Habits, Chronic diarrhea, Constipation, Difficulty Swallowing, Excessive gas, Gets full quickly at meals, Indigestion, Nausea, Rectal Pain and Vomiting. Musculoskeletal Present- Back Pain and Muscle Weakness. Not Present- Joint Pain, Joint Stiffness, Muscle Pain and Swelling of Extremities. Psychiatric Present- Change in Sleep Pattern. Not Present- Anxiety, Bipolar, Depression, Fearful and Frequent crying. Endocrine Present- Hair Changes and Hot flashes. Not Present- Cold Intolerance, Excessive Hunger, Heat Intolerance and New Diabetes. Hematology Not Present- Blood Thinners, Easy Bruising, Excessive bleeding, Gland problems, HIV and Persistent Infections.  Vitals [place] Gerrigner CMA; 08/11/2017 2:33 PM) 08/11/2017 2:33 PM Weight: 184 lb Height: 62in Body Surface Area: 1.84 m Body Mass Index: 33.65 kg/m  Temp.: 98.22F(Oral)  Pulse: 103 (Regular)  BP: 162/90 (Sitting, Right Arm, Standard)      Physical Exam [name] MD; 08/11/2017 2:46 PM)  General Mental Status-Alert. General Appearance-Not in acute distress, Not Sickly. Orientation-Oriented X3. Hydration-Well hydrated. Voice-Normal.  Integumentary Global  Assessment Upon inspection and palpation of skin surfaces of the - Axillae: non-tender, no inflammation or ulceration, no drainage. and Distribution of scalp and body hair is normal. General Characteristics Temperature - normal warmth is noted.  Head and Neck Head-normocephalic, atraumatic with no lesions or palpable masses. Face Global Assessment - atraumatic, no absence of expression. Neck Global Assessment - no abnormal movements, no bruit auscultated on the right, no bruit auscultated on the left, no decreased range of motion, non-tender. Trachea-midline. Thyroid Gland Characteristics - non-tender.  Eye Eyeball - Left-Extraocular movements intact, No Nystagmus. Eyeball - Right-Extraocular movements intact, No Nystagmus. Cornea - Left-No Hazy. Cornea - Right-No Hazy. Sclera/Conjunctiva - Left-No scleral icterus, No Discharge. Sclera/Conjunctiva - Right-No scleral icterus, No Discharge. Pupil - Left-Direct reaction to light normal. Pupil - Right-Direct reaction to light normal.  ENMT Ears Pinna - Left - no drainage observed, no generalized tenderness observed. Right - no drainage observed, no generalized tenderness observed. Nose and Sinuses External Inspection of the Nose - no destructive lesion observed. Inspection of the nares - Left - quiet respiration. Right - quiet respiration. Mouth and Throat Lips - Upper Lip - no fissures observed, no pallor noted. Lower Lip - no fissures observed, no pallor noted. Nasopharynx - no discharge present. Oral Cavity/Oropharynx - Tongue - no dryness observed. Oral Mucosa - no cyanosis observed. Hypopharynx - no evidence of airway distress observed.  Chest and Lung Exam Inspection Movements - Normal and Symmetrical. Accessory muscles - No use of accessory muscles in breathing. Palpation Palpation of the chest reveals - Non-tender. Auscultation Breath sounds - Normal and Clear.  Cardiovascular Auscultation Rhythm -  Regular. Murmurs & Other Heart Sounds - Auscultation of the heart reveals - No Murmurs and No Systolic Clicks.  Abdomen Inspection Inspection of the abdomen reveals - No Visible peristalsis and No Abnormal pulsations. Umbilicus - No Bleeding, No Urine drainage. Palpation/Percussion Palpation and Percussion of the abdomen reveal - Soft, Non Tender, No Rebound tenderness, No Rigidity (guarding) and No Cutaneous hyperesthesia. Note: Abdomen soft. Not severely distended. No distasis recti. No umbilical or other anterior abdominal wall hernias  Female Genitourinary Sexual Maturity Tanner 5 - Adult hair pattern. Note: No vaginal  bleeding nor discharge  Rectal Note: ` ` ` Please refer to anoscopy section. Left posterior/lateral chronically prolapsed Grade 4 hemorrhoid. Sensitive.  Peripheral Vascular Upper Extremity Inspection - Left - No Cyanotic nailbeds, Not Ischemic. Right - No Cyanotic nailbeds, Not Ischemic.  Neurologic Neurologic evaluation reveals -normal attention span and ability to concentrate, able to name objects and repeat phrases. Appropriate fund of knowledge , normal sensation and normal coordination. Mental Status Affect - not angry, not paranoid. Cranial Nerves-Normal Bilaterally. Gait-Normal.  Neuropsychiatric Mental status exam performed with findings of-able to articulate well with normal speech/language, rate, volume and coherence, thought content normal with ability to perform basic computations and apply abstract reasoning and no evidence of hallucinations, delusions, obsessions or homicidal/suicidal ideation. Note: Fair recollection but no obvious dementia  Musculoskeletal Global Assessment Spine, Ribs and Pelvis - no instability, subluxation or laxity. Right Upper Extremity - no instability, subluxation or laxity.  Lymphatic Head & Neck  General Head & Neck Lymphatics: Bilateral - Description - No Localized  lymphadenopathy. Axillary  General Axillary Region: Bilateral - Description - No Localized lymphadenopathy. Femoral & Inguinal  Generalized Femoral & Inguinal Lymphatics: Left - Description - No Localized lymphadenopathy. Right - Description - No Localized lymphadenopathy.   Results [name] MD; 08/11/2017 3:04 PM) Procedures  Name Value Date Hemorrhoids Procedure Anal exam: prolapse Internal exam: Internal Hemorroids ( non-bleeding) prolapse Other: Left prolapsed mucosal mass consistent with grade 4 left posterior lateral hemorrhoid. Sensitive. No thrombosis. No definite fissure or fistula. Anoscopy confirms grade 2 internal hemorrhoids............[name] KitchenPerianal skin clean with chronic moisture but no poor hygiene. No pruritis ani. No pilonidal disease. No fissure. No abscess/fistula. Normal sphincter tone. ....[name] KitchenMarland Kitcheno condyloma warts. Sensitive but tolerates digital and anoscopic rectal exam. No rectal masses.  Performed: 08/11/2017 2:51 PM    Assessment & Plan [name] MD; 08/11/2017 2:51 PM)  PROLAPSED INTERNAL HEMORRHOIDS, GRADE 4 (K64.3) Impression: Chronically prolapsed left posterolateral hemorrhoid. Sensitive. Chronic irritation. Doubt fistula with hypertrophy.  This will not go away without surgery. She wishes to consider this but definitely wants to be under general anesthesia. I don't blame her.  The anatomy & physiology of the anorectal region was discussed. The pathophysiology of hemorrhoids and differential diagnosis was discussed. Natural history progression was discussed. I stressed the importance of a bowel regimen to have daily soft bowel movements to minimize progression of disease. Goal of one BM / day ideal. Use of wet wipes, warm baths, avoiding straining, etc were emphasized.  Educational handouts further explaining the pathology, treatment options, and bowel regimen were given as well. The patient expressed  understanding.  Current Plans ANOSCOPY, DIAGNOSTIC (63016) Pt Education - Pamphlet Given - The Hemorrhoid Book: discussed with patient and provided information. The anatomy & physiology of the anorectal region was discussed. The pathophysiology of hemorrhoids and differential diagnosis was discussed. Natural history risks without surgery was discussed. I stressed the importance of a bowel regimen to have daily soft bowel movements to minimize progression of disease. Interventions such as sclerotherapy & banding were discussed.  The patient's symptoms are not adequately controlled by medicines and other non-operative treatments. I feel the risks & problems of no surgery outweigh the operative risks; therefore, I recommended surgery to treat the hemorrhoids by ligation, pexy, and possible resection.  Risks such as bleeding, infection, urinary difficulties, need for further treatment, heart attack, death, and other risks were discussed. I noted a good likelihood this will help address the problem. Goals of post-operative recovery were discussed as well.  Possibility that this will not correct all symptoms was explained. Post-operative pain, bleeding, constipation, and other problems after surgery were discussed. We will work to minimize complications. Educational handouts further explaining the pathology, treatment options, and bowel regimen were given as well. Questions were answered. The patient expresses understanding & wishes to proceed with surgery.  Pt Education - [facility] Hemorrhoids (Ariaunna [place]): discussed with patient and provided information.  PROLAPSED INTERNAL HEMORRHOIDS, GRADE 2 (K64.1)   ENCOUNTER FOR PREOPERATIVE EXAMINATION FOR GENERAL SURGICAL PROCEDURE (Z01.818)  Current Plans You are being scheduled for surgery- Our schedulers will call you.  You should hear from our office's scheduling department within 5 working days about the location, date, and time of surgery. We  try to make accommodations for patient's preferences in scheduling surgery, but sometimes the OR schedule or the surgeon's schedule prevents [place] from making those accommodations.  If you have not heard from our office [phone]) in 5 working days, call the office and ask for your surgeon's nurse.  If you have other questions about your diagnosis, plan, or surgery, call the office and ask for your surgeon's nurse.  Pt Education - [facility] Rectal Prep for Anorectal outpatient/office surgery: discussed with patient and provided information. Pt Education - [facility] Rectal Surgery HCI (Makalyn Lennox): discussed with patient and provided information.  [name], M.D., F.A.C.S. Gastrointestinal and Minimally Invasive Surgery [facility] [address], [place],  [id number] [phone] Main / Paging

## 2017-08-12 ENCOUNTER — Encounter (HOSPITAL_COMMUNITY): Payer: Self-pay | Admitting: Surgery

## 2017-08-12 DIAGNOSIS — R531 Weakness: Secondary | ICD-10-CM

## 2017-08-12 DIAGNOSIS — K219 Gastro-esophageal reflux disease without esophagitis: Secondary | ICD-10-CM | POA: Diagnosis present

## 2017-08-12 DIAGNOSIS — M549 Dorsalgia, unspecified: Secondary | ICD-10-CM

## 2017-08-12 DIAGNOSIS — G8929 Other chronic pain: Secondary | ICD-10-CM | POA: Diagnosis present

## 2017-08-12 DIAGNOSIS — E785 Hyperlipidemia, unspecified: Secondary | ICD-10-CM | POA: Diagnosis present

## 2017-08-12 LAB — GLUCOSE, CAPILLARY
GLUCOSE-CAPILLARY: 81 mg/dL (ref 65–99)
GLUCOSE-CAPILLARY: 78 mg/dL (ref 65–99)
GLUCOSE-CAPILLARY: 113 mg/dL — AB (ref 65–99)
Glucose-Capillary: 81 mg/dL (ref 65–99)
Glucose-Capillary: 125 mg/dL — ABNORMAL HIGH (ref 65–99)
Glucose-Capillary: 75 mg/dL (ref 65–99)

## 2017-08-12 LAB — CBC
HCT: 25.6 % — ABNORMAL LOW (ref 36.0–46.0)
Hemoglobin: 8.5 g/dL — ABNORMAL LOW (ref 12.0–15.0)
MCH: 29.6 pg (ref 26.0–34.0)
MCHC: 33.2 g/dL (ref 30.0–36.0)
MCV: 89.2 fL (ref 78.0–100.0)
PLATELETS: 226 10*3/uL (ref 150–400)
RBC: 2.87 MIL/uL — ABNORMAL LOW (ref 3.87–5.11)
RDW: 13.1 % (ref 11.5–15.5)
WBC: 8.7 10*3/uL (ref 4.0–10.5)

## 2017-08-12 LAB — PHOSPHORUS: Phosphorus: 2.6 mg/dL (ref 2.5–4.6)

## 2017-08-12 LAB — BASIC METABOLIC PANEL
Anion gap: 11 (ref 5–15)
BUN: 17 mg/dL (ref 6–20)
CO2: 20 mmol/L — ABNORMAL LOW (ref 22–32)
Calcium: 8.4 mg/dL — ABNORMAL LOW (ref 8.9–10.3)
Chloride: 108 mmol/L (ref 101–111)
Creatinine, Ser: 0.55 mg/dL (ref 0.44–1.00)
Glucose, Bld: 88 mg/dL (ref 65–99)
POTASSIUM: 3.8 mmol/L (ref 3.5–5.1)
SODIUM: 139 mmol/L (ref 135–145)

## 2017-08-12 LAB — MAGNESIUM: MAGNESIUM: 1.9 mg/dL (ref 1.7–2.4)

## 2017-08-12 MED ORDER — ACETAMINOPHEN 500 MG PO TABS
1000.0000 mg | ORAL_TABLET | Freq: Three times a day (TID) | ORAL | Status: DC
  Administered 2017-08-12 – 2017-08-15 (×10): 1000 mg via ORAL
  Filled 2017-08-12 (×10): qty 2

## 2017-08-12 MED ORDER — HYDROMORPHONE HCL 1 MG/ML IJ SOLN: 0.5000 mg | INTRAMUSCULAR | Status: DC | PRN

## 2017-08-12 MED ORDER — LIP MEDEX EX OINT
1.0000 "application " | TOPICAL_OINTMENT | Freq: Two times a day (BID) | CUTANEOUS | Status: DC
  Administered 2017-08-12 – 2017-08-15 (×5): 1 via TOPICAL
  Filled 2017-08-12 (×3): qty 7

## 2017-08-12 MED ORDER — TRAMADOL HCL 50 MG PO TABS: 50.0000 mg | ORAL_TABLET | Freq: Four times a day (QID) | ORAL | Status: DC | PRN

## 2017-08-12 MED ORDER — SACCHAROMYCES BOULARDII 250 MG PO CAPS
250.0000 mg | ORAL_CAPSULE | Freq: Two times a day (BID) | ORAL | Status: DC
  Administered 2017-08-12 – 2017-08-15 (×7): 250 mg via ORAL
  Filled 2017-08-12 (×7): qty 1

## 2017-08-12 MED ORDER — LORATADINE 10 MG PO TABS
10.0000 mg | ORAL_TABLET | Freq: Every day | ORAL | Status: DC
  Administered 2017-08-12 – 2017-08-15 (×4): 10 mg via ORAL
  Filled 2017-08-12 (×4): qty 1

## 2017-08-12 MED ORDER — PSYLLIUM 95 % PO PACK
1.0000 | PACK | Freq: Two times a day (BID) | ORAL | Status: DC
  Administered 2017-08-12 – 2017-08-14 (×6): 1 via ORAL
  Filled 2017-08-12 (×7): qty 1

## 2017-08-12 MED ORDER — MENTHOL 3 MG MT LOZG
1.0000 | LOZENGE | OROMUCOSAL | Status: DC | PRN
  Filled 2017-08-12: qty 9

## 2017-08-12 MED ORDER — HYDROCORTISONE 1 % EX CREA
1.0000 "application " | TOPICAL_CREAM | Freq: Three times a day (TID) | CUTANEOUS | Status: DC | PRN
  Filled 2017-08-12: qty 28

## 2017-08-12 MED ORDER — LACTATED RINGERS IV BOLUS (SEPSIS): 1000.0000 mL | Freq: Three times a day (TID) | INTRAVENOUS | Status: AC | PRN

## 2017-08-12 MED ORDER — ENOXAPARIN SODIUM 40 MG/0.4ML ~~LOC~~ SOLN
40.0000 mg | SUBCUTANEOUS | Status: DC
  Administered 2017-08-12 – 2017-08-15 (×4): 40 mg via SUBCUTANEOUS
  Filled 2017-08-12 (×4): qty 0.4

## 2017-08-12 MED ORDER — LOPERAMIDE HCL 2 MG PO CAPS: 2.0000 mg | ORAL_CAPSULE | Freq: Three times a day (TID) | ORAL | Status: DC | PRN

## 2017-08-12 MED ORDER — TRAMADOL HCL 50 MG PO TABS
50.0000 mg | ORAL_TABLET | Freq: Four times a day (QID) | ORAL | Status: DC | PRN
  Administered 2017-08-12 – 2017-08-15 (×7): 100 mg via ORAL
  Filled 2017-08-12 (×8): qty 2

## 2017-08-12 MED ORDER — HYDROMORPHONE HCL 1 MG/ML IJ SOLN
0.5000 mg | INTRAMUSCULAR | Status: DC | PRN
  Administered 2017-08-12 – 2017-08-13 (×2): 1 mg via INTRAVENOUS
  Filled 2017-08-12 (×2): qty 1

## 2017-08-12 MED ORDER — SODIUM CHLORIDE 0.9% FLUSH
3.0000 mL | Freq: Two times a day (BID) | INTRAVENOUS | Status: DC
  Administered 2017-08-12 – 2017-08-15 (×6): 3 mL via INTRAVENOUS

## 2017-08-12 MED ORDER — SODIUM CHLORIDE 0.9 % IV SOLN: 250.0000 mL | INTRAVENOUS | Status: DC | PRN

## 2017-08-12 MED ORDER — IBUPROFEN 800 MG PO TABS: 800.0000 mg | ORAL_TABLET | Freq: Four times a day (QID) | ORAL | Status: DC

## 2017-08-12 MED ORDER — HYDROCORTISONE 2.5 % RE CREA
1.0000 "application " | TOPICAL_CREAM | Freq: Four times a day (QID) | RECTAL | Status: DC | PRN
  Filled 2017-08-12: qty 28.35

## 2017-08-12 MED ORDER — FERROUS SULFATE 325 (65 FE) MG PO TABS
325.0000 mg | ORAL_TABLET | Freq: Two times a day (BID) | ORAL | Status: DC
  Administered 2017-08-12 – 2017-08-15 (×7): 325 mg via ORAL
  Filled 2017-08-12 (×7): qty 1

## 2017-08-12 MED ORDER — GABAPENTIN 400 MG PO CAPS
400.0000 mg | ORAL_CAPSULE | Freq: Three times a day (TID) | ORAL | Status: DC
  Administered 2017-08-12 – 2017-08-15 (×10): 400 mg via ORAL
  Filled 2017-08-12 (×10): qty 1

## 2017-08-12 MED ORDER — MAGIC MOUTHWASH
15.0000 mL | Freq: Four times a day (QID) | ORAL | Status: DC | PRN
  Filled 2017-08-12: qty 15

## 2017-08-12 MED ORDER — IBUPROFEN 800 MG PO TABS
800.0000 mg | ORAL_TABLET | Freq: Four times a day (QID) | ORAL | Status: DC | PRN
Start: 2017-08-12 — End: 2017-08-13
  Administered 2017-08-12: 800 mg via ORAL
  Filled 2017-08-12: qty 1

## 2017-08-12 MED ORDER — LOPERAMIDE HCL 2 MG PO CAPS
2.0000 mg | ORAL_CAPSULE | Freq: Every day | ORAL | Status: DC
  Administered 2017-08-12: 2 mg via ORAL
  Filled 2017-08-12: qty 1

## 2017-08-12 MED ORDER — GUAIFENESIN-DM 100-10 MG/5ML PO SYRP: 10.0000 mL | ORAL_SOLUTION | ORAL | Status: DC | PRN

## 2017-08-12 MED ORDER — METHOCARBAMOL 500 MG PO TABS: 1000.0000 mg | ORAL_TABLET | Freq: Four times a day (QID) | ORAL | Status: DC | PRN

## 2017-08-12 MED ORDER — SODIUM CHLORIDE 0.9% FLUSH: 3.0000 mL | INTRAVENOUS | Status: DC | PRN

## 2017-08-12 MED ORDER — PHENOL 1.4 % MT LIQD
1.0000 | OROMUCOSAL | Status: DC | PRN
  Filled 2017-08-12: qty 177

## 2017-08-12 MED ORDER — METHOCARBAMOL 1000 MG/10ML IJ SOLN
1000.0000 mg | Freq: Four times a day (QID) | INTRAVENOUS | Status: DC | PRN
  Filled 2017-08-12: qty 10

## 2017-08-12 NOTE — Progress Notes (Signed)
OT Note  Patient Details Name: [place] MRN: [id number] DOB: [birth year]   Cancelled Treatment:    Reason Eval/Treat Not Completed: Other (comment)  Pt had just gotten up to the chair- will check on pt later in day or next day Thanks, Kari Baars, [place]  [place] 08/12/2017, 12:42 PM

## 2017-08-12 NOTE — Progress Notes (Addendum)
[address] [place], [place] [address] Phone: [phone]  FAX: [phone]      Wanda Baker [id number] 1961/03/05  CARE TEAM:  PCP: Tonette Bihari, MD  Outpatient Care Team: Patient Care Team: Tonette Bihari, MD as PCP - General (Family Medicine) Ileana Roup, MD as Consulting Physician (General Surgery) Fanny Skates, MD as Consulting Physician (General Surgery) Mauri Pole, MD as Consulting Physician (Gastroenterology)  Inpatient Treatment Team: Treatment Team: Attending Provider: Michael Boston, MD; Technician: Abbe Amsterdam, NT; Technician: Sueanne Margarita, NT; Registered Nurse: Illene Regulus, RN; Registered Nurse: Jeannie Fend, RN; Technician: Leeanne Rio, NT; Consulting Physician: Michael Boston, MD; Occupational Therapist: Betsy Pries, OT; Technician: Layla Maw, [place]; Registered Nurse: Mortimer Fries, RN; Consulting Physician: Garner Gavel, MD   Problem List:   Principal Problem:   S/p colostomy takedown with protective loop ileostomy 08/08/2017 Active Problems:   Rectal cancer ([place])   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   Ileostomy in place for fecal diversion   HYPERTENSION, BENIGN ESSENTIAL   Diabetes type 2, controlled ([place])   Sinus tachycardia   Anemia of chronic disease   Hyperlipidemia   GERD (gastroesophageal reflux disease)   Chronic back pain   Weakness   4 Days Post-Op  08/08/2017  POST-OPERATIVE DIAGNOSIS:  History of rectal cancer s/p Hartmann's procedure  PROCEDURE:   1. Exploratory laparotomy 2. Lysis of adhesions x60 minutes 3. Takedown of splenic flexure 4. Takedown of colostomy with stapled colorectal anastomosis 5. Small bowel resection with enteroenterostomy 6. Flexible sigmoidoscopy 7. Creation of ileostomy  SURGEON:  Sharon Mt. Dema Severin, MD  ASSISTANT: Michael Boston,  MD - an MD assistant was necessary for complex pelvic exposure, complex retraction and additionally for creation of a stapled colorectal anastomosis.     Assessment  "I'm doing fine, darlin'"  Ileus resolving.  Tachycardia resolving.  Plan:  -adv diet.  Fulls/pureed, then solid -ileostomy care -bowel regimen for ileostomy -follow off IVF w aggressive PRN boluses -lytes OK - follow -inc PO pain control - scheduled tylenol.  Inc gabapentin to 400 TID.  PRN ice/heat/ibuprofen/robaxin/tramadol/dilaudid. -acute postop on chronic anemia - PO iron & follow.  D/w TR[facility] DR Erlinda Hong.  If orthostsatic or Hgb drops tomorrow, may consider blood transfusion -f/u pathology -DM control - SSI -keep drain - most likely d/c at d/c home -VTE prophylaxis- SCDs, lovenox -mobilize as tolerated to help recovery.  GET HER UP!  Mobilize on tele - if OK & HR OK, d/c telemetry tomorrow (if medicine agrees)  40 minutes spent in review, evaluation, examination, counseling, and coordination of care.  More than 50% of that time was spent in counseling.  [name], M.D., F.A.C.S. Gastrointestinal and Minimally Invasive Surgery [facility] [address] [place], [place] [address] [phone] Main / Paging   08/12/2017    Subjective: (Chief complaint)  HR better controlled No major telemetry events Not much appetite but no nausea CNA in room Walking to bathroom only - not dizzy Pain mostly controlled - h/o chronic pain  Objective:  Vital signs:  Vitals:   08/11/17 1500 08/11/17 1725 08/11/17 2023 08/12/17 0411  BP: (!) 142/71 128/75 (!) 137/58 (!) 148/81  Pulse: (!) 103 (!) 104 (!) 106 98  Resp: _0 Temp: 97.6 F (36.4 C) 99 F (37.2 C) 98.6 F (37 C) 99 F (37.2 C)  TempSrc: Oral Oral Oral Oral  SpO2: 100% 94% 96% 96%  Weight:    75.6 kg (166 lb 11.2 oz)  Height:        Last BM Date: 08/11/17  Intake/Output   Yesterday:  02/25 0701  - 02/26 0700 In: 2860 [P.O.:360; I.V.:2500] Out: 5465 [Urine:1700; Drains:31; KPTWS:5681] This shift:  Total I/O In: 1610 [P.O.:360; I.V.:1250] Out: 711 [Drains:11; Stool:700]  Bowel function:  Flatus: YES  BM:  YES - thin green  Drain: Serosanguinous   Physical Exam:  General: Pt awake/alert/oriented x4 in no acute distress.  Tired but not toxic Eyes: PERRL, normal EOM.  Sclera clear.  No icterus Neuro: CN II-XII intact w/o focal sensory/motor deficits. Lymph: No head/neck/groin lymphadenopathy Psych:  No delerium/psychosis/paranoia HENT: Normocephalic, Mucus membranes moist.  No thrush Neck: Supple, No tracheal deviation Chest: No chest wall pain w good excursion CV:  Pulses intact.  Regular rhythm MS: Normal AROM mjr joints.  No obvious deformity  Abdomen: Soft.  Mildy distended.  Mildly tender at incisions only.  RLQ ileostomy w mild duskiness.  Midline incision c/d/i.  LLQ old colostomy wound clean.  No evidence of peritonitis.  No incarcerated hernias.  Ext:  No deformity.  No mjr edema.  No cyanosis Skin: No petechiae / purpura  Results:   Labs: Results for orders placed or performed during the hospital encounter of 08/08/17 (from the past 48 hour(s))  Glucose, capillary     Status: None   Collection Time: 08/10/17  8:34 AM  Result Value Ref Range   Glucose-Capillary 99 65 - 99 mg/dL   Comment 1 Notify RN    Comment 2 Document in Chart   Glucose, capillary     Status: None   Collection Time: 08/10/17 12:13 PM  Result Value Ref Range   Glucose-Capillary 95 65 - 99 mg/dL   Comment 1 Notify RN    Comment 2 Document in Chart   Glucose, capillary     Status: None   Collection Time: 08/10/17  8:33 PM  Result Value Ref Range   Glucose-Capillary 89 65 - 99 mg/dL  Glucose, capillary     Status: None   Collection Time: 08/10/17 11:27 PM  Result Value Ref Range   Glucose-Capillary 95 65 - 99 mg/dL  Glucose, capillary     Status: Abnormal   Collection Time:  08/11/17  3:45 AM  Result Value Ref Range   Glucose-Capillary 102 (H) 65 - 99 mg/dL  CBC     Status: Abnormal   Collection Time: 08/11/17  4:32 AM  Result Value Ref Range   WBC 12.2 (H) 4.0 - 10.5 K/uL   RBC 3.09 (L) 3.87 - 5.11 MIL/uL   Hemoglobin 8.9 (L) 12.0 - 15.0 g/dL   HCT 27.0 (L) 36.0 - 46.0 %   MCV 87.4 78.0 - 100.0 fL   MCH 28.8 26.0 - 34.0 pg   MCHC 33.0 30.0 - 36.0 g/dL   RDW 13.1 11.5 - 15.5 %   Platelets 209 150 - 400 K/uL    Comment: Performed at [facility], [place] [address] [place], [place] [address]  Basic metabolic panel     Status: Abnormal   Collection Time: 08/11/17  4:32 AM  Result Value Ref Range   Sodium 137 135 - 145 mmol/L   Potassium 4.1 3.5 - 5.1 mmol/L   Chloride 103 101 - 111 mmol/L   CO2 22 22 - 32 mmol/L   Glucose, Bld 109 (H) 65 - 99  mg/dL   BUN 14 6 - 20 mg/dL   Creatinine, Ser 0.69 0.44 - 1.00 mg/dL   Calcium 8.5 (L) 8.9 - 10.3 mg/dL   GFR calc non Af Amer >60 >60 mL/min   GFR calc Af Amer >60 >60 mL/min    Comment: (NOTE) The eGFR has been calculated using the CKD EPI equation. This calculation has not been validated in all clinical situations. eGFR's persistently <60 mL/min signify possible Chronic Kidney Disease.    Anion gap 12 5 - 15    Comment: Performed at [facility], [place] [address] [place], [place] [address]  Magnesium     Status: None   Collection Time: 08/11/17  4:32 AM  Result Value Ref Range   Magnesium 2.0 1.7 - 2.4 mg/dL    Comment: Performed at [facility], [place] [address] [place], [place] [address]  Phosphorus     Status: None   Collection Time: 08/11/17  4:32 AM  Result Value Ref Range   Phosphorus 2.7 2.5 - 4.6 mg/dL    Comment: Performed at [facility], [place] [address], [place], [place] [address]  Glucose, capillary     Status: None   Collection Time: 08/11/17  7:12 AM  Result Value Ref Range   Glucose-Capillary 90 65 - 99 mg/dL   Glucose, capillary     Status: None   Collection Time: 08/11/17 12:05 PM  Result Value Ref Range   Glucose-Capillary 97 65 - 99 mg/dL  TSH     Status: Abnormal   Collection Time: 08/11/17  2:06 PM  Result Value Ref Range   TSH 0.249 (L) 0.350 - 4.500 uIU/mL    Comment: Performed by a 3rd Generation assay with a functional sensitivity of <=0.01 uIU/mL. Performed at [facility], [place] [address] [place], [place] [address]   T4, free     Status: None   Collection Time: 08/11/17  2:06 PM  Result Value Ref Range   Free T4 0.90 0.61 - 1.12 ng/dL    Comment: (NOTE) Biotin ingestion may interfere with free T4 tests. If the results are inconsistent with the TSH level, previous test results, or the clinical presentation, then consider biotin interference. If needed, order repeat testing after stopping biotin. Performed at [facility], [place] [address], [place], [place] [address]   Lactic acid, plasma     Status: None   Collection Time: 08/11/17  2:06 PM  Result Value Ref Range   Lactic Acid, Venous 0.8 0.5 - 1.9 mmol/L    Comment: Performed at [facility], [place] [address] [place], [place] [address]  Glucose, capillary     Status: None   Collection Time: 08/11/17  4:26 PM  Result Value Ref Range   Glucose-Capillary 90 65 - 99 mg/dL  Lactic acid, plasma     Status: None   Collection Time: 08/11/17  4:32 PM  Result Value Ref Range   Lactic Acid, Venous 0.9 0.5 - 1.9 mmol/L    Comment: Performed at [facility], [place] [address] [place], [place] [address]  Glucose, capillary     Status: None   Collection Time: 08/11/17  9:18 PM  Result Value Ref Range   Glucose-Capillary 83 65 - 99 mg/dL  Glucose, capillary     Status: None   Collection Time: 08/12/17  1:09 AM  Result Value Ref Range   Glucose-Capillary 81 65 - 99 mg/dL  Glucose, capillary  Status: None   Collection Time: 08/12/17  5:27 AM  Result Value Ref Range    Glucose-Capillary 81 65 - 99 mg/dL  CBC     Status: Abnormal   Collection Time: 08/12/17  5:37 AM  Result Value Ref Range   WBC 8.7 4.0 - 10.5 K/uL   RBC 2.87 (L) 3.87 - 5.11 MIL/uL   Hemoglobin 8.5 (L) 12.0 - 15.0 g/dL   HCT 25.6 (L) 36.0 - 46.0 %   MCV 89.2 78.0 - 100.0 fL   MCH 29.6 26.0 - 34.0 pg   MCHC 33.2 30.0 - 36.0 g/dL   RDW 13.1 11.5 - 15.5 %   Platelets 226 150 - 400 K/uL    Comment: Performed at [facility], [place] [address], [place], [place] [address]  Basic metabolic panel     Status: Abnormal   Collection Time: 08/12/17  5:37 AM  Result Value Ref Range   Sodium 139 135 - 145 mmol/L   Potassium 3.8 3.5 - 5.1 mmol/L   Chloride 108 101 - 111 mmol/L   CO2 20 (L) 22 - 32 mmol/L   Glucose, Bld 88 65 - 99 mg/dL   BUN 17 6 - 20 mg/dL   Creatinine, Ser 0.55 0.44 - 1.00 mg/dL   Calcium 8.4 (L) 8.9 - 10.3 mg/dL   GFR calc non Af Amer >60 >60 mL/min   GFR calc Af Amer >60 >60 mL/min    Comment: (NOTE) The eGFR has been calculated using the CKD EPI equation. This calculation has not been validated in all clinical situations. eGFR's persistently <60 mL/min signify possible Chronic Kidney Disease.    Anion gap 11 5 - 15    Comment: Performed at [facility], [place] [address] [place], [place] [address]  Magnesium     Status: None   Collection Time: 08/12/17  5:37 AM  Result Value Ref Range   Magnesium 1.9 1.7 - 2.4 mg/dL    Comment: Performed at [facility], [place] [address], [place], [place] [address]  Phosphorus     Status: None   Collection Time: 08/12/17  5:37 AM  Result Value Ref Range   Phosphorus 2.6 2.5 - 4.6 mg/dL    Comment: Performed at [facility], [place] [address] [place], [place] [address]    Imaging / Studies: Ct Angio Chest Pe W Or Wo Contrast  Result Date: 08/10/2017 CLINICAL DATA:  Tachycardia and hypoxia. EXAM: CT ANGIOGRAPHY CHEST WITH CONTRAST TECHNIQUE: Multidetector CT  imaging of the chest was performed using the standard protocol during bolus administration of intravenous contrast. Multiplanar CT image reconstructions and MIPs were obtained to evaluate the vascular anatomy. CONTRAST:  100 mL Isovue 370 COMPARISON:  Chest radiograph 08/10/2017 FINDINGS: Cardiovascular: Contrast injection is sufficient to demonstrate satisfactory opacification of the pulmonary arteries to the segmental level. There is no pulmonary embolus. The main pulmonary artery is within normal limits for size. There is a normal 3-vessel arch branching pattern without evidence of acute aortic syndrome. There is noaortic atherosclerosis. Heart size is normal, without pericardial effusion. Coronary artery calcification. Mediastinum/Nodes: 11 mm left thyroid nodule. Normal course of the thoracic esophagus. No mediastinal or axillary lymphadenopathy. Lungs/Pleura: Small pleural effusions with bibasilar atelectasis or consolidation. Upper Abdomen: Contrast bolus timing is not optimized for evaluation of the abdominal organs. Hepatic steatosis. Musculoskeletal: No chest wall abnormality. No acute or significant osseous findings. Review of the MIP images confirms the above findings. IMPRESSION: 1. No pulmonary  embolus. 2. Small pleural effusions with associated atelectasis or consolidation. Electronically Signed   By: Ulyses Jarred M.D.   On: 08/10/2017 18:28   Ct Abdomen Pelvis W Contrast  Result Date: 08/11/2017 CLINICAL DATA:  Abdominal distension. EXAM: CT ABDOMEN AND PELVIS WITH CONTRAST TECHNIQUE: Multidetector CT imaging of the abdomen and pelvis was performed using the standard protocol following bolus administration of intravenous contrast. CONTRAST:  37m ISOVUE-300 IOPAMIDOL (ISOVUE-300) INJECTION 61% orally, 1043mISOVUE-300 IOPAMIDOL (ISOVUE-300) INJECTION 61% intravenously. COMPARISON:  CT scan of September 16, 2016. FINDINGS: Lower chest: Mild bilateral pleural effusions are noted with adjacent  subsegmental atelectasis. Hepatobiliary: No gallstones are noted. Fatty infiltration of the liver is noted. Pancreas: Unremarkable. No pancreatic ductal dilatation or surrounding inflammatory changes. Spleen: Normal in size without focal abnormality. Adrenals/Urinary Tract: Adrenal glands appear normal. Left kidney and ureter are unremarkable. Mild right hydroureteronephrosis is noted without evidence of obstructive calculus. Urinary bladder is decompressed secondary to Foley catheter. Stomach/Bowel: Postsurgical changes are seen in the sigmoid rectum region. Ileostomy is noted in the right lower quadrant. Mildly dilated small bowel loops are noted which may represent postoperative ileus. Vascular/Lymphatic: Aortic atherosclerosis. No enlarged abdominal or pelvic lymph nodes. Reproductive: Status post hysterectomy. No adnexal masses. Other: Surgical drain is seen involving the left lower quadrant of the abdomen with distal tip in the pelvis. Mild anasarca is noted. Old colostomy site is seen in the left lower quadrant site. Minimal amount of free fluid is noted in the pelvis and pericolic gutters. Musculoskeletal: No acute or significant osseous findings. IMPRESSION: Mild bilateral pleural effusions with adjacent subsegmental atelectasis. Postsurgical changes are seen in the rectosigmoid region. Ileostomy is noted in right lower quadrant. Old colostomy site is seen in the left lower quadrant. Mildly dilated small bowel loops are noted which most likely represent postoperative ileus. Fatty infiltration of the liver. Aortic Atherosclerosis (ICD10-I70.0). Electronically Signed   By: JaMarijo ConceptionM.D.   On: 08/11/2017 14:12   Dg Chest Port 1 View  Result Date: 08/10/2017 CLINICAL DATA:  Tachycardia EXAM: PORTABLE CHEST 1 VIEW COMPARISON:  09/17/2016 chest radiograph. FINDINGS: Surgical hardware from ACDF overlies the lower cervical spine. Stable cardiomediastinal silhouette with top-normal heart size. No  pneumothorax. Possible small left pleural effusion. No right pleural effusion. Low lung volumes. No overt pulmonary edema. Mild bibasilar opacities, favor atelectasis. IMPRESSION: 1. Low lung volumes with mild bibasilar opacities, favor atelectasis. 2. Possible small left pleural effusion. Electronically Signed   By: JaIlona Sorrel.D.   On: 08/10/2017 11:13    Medications / Allergies: per chart  Antibiotics: Anti-infectives (From admission, onward)   Start     Dose/Rate Route Frequency Ordered Stop   08/08/17 1346  clindamycin (CLEOCIN) 900 MG/50ML IVPB    Comments:  Whitlow, Cheryl   : cabinet override      08/08/17 1346 08/08/17 1352   08/08/17 0600  clindamycin (CLEOCIN) IVPB 900 mg     900 mg 100 mL/hr over 30 Minutes Intravenous 30 min pre-op 08/07/17 1325 08/08/17 1352   08/08/17 0600  gentamicin (GARAMYCIN) 360 mg in dextrose 5 % 100 mL IVPB     360 mg 218 mL/hr over 30 Minutes Intravenous 30 min pre-op 08/07/17 1325 08/08/17 0815   08/08/17 0552  neomycin (MYCIFRADIN) tablet 1,000 mg  Status:  Discontinued     1,000 mg Oral 3 times per day 08/08/17 0552 08/08/17 1953   08/08/17 0552  metroNIDAZOLE (FLAGYL) tablet 1,000 mg  Status:  Discontinued  1,000 mg Oral 3 times per day 08/08/17 8675 08/08/17 1953        Note: Portions of this report may have been transcribed using voice recognition software. Every effort was made to ensure accuracy; however, inadvertent computerized transcription errors may be present.   Any transcriptional errors that result from this process are unintentional.     [name], M.D., F.A.C.S. Gastrointestinal and Minimally Invasive Surgery [facility] [address], [place], [place] [address] [phone] Main / Paging   08/12/2017

## 2017-08-12 NOTE — Evaluation (Signed)
Physical Therapy Evaluation Patient Details Name: Wanda Baker MRN: [id number] DOB: [birth year] Today's Date: 08/12/2017   History of Present Illness  Wanda Baker is a 57 y.o. female with medical history significant of adenocarcinoma of the colon status post exploratory lap, small bowel resection, adhesiolysis, Hartmann's takedown with stapled low colorectal anastomosis on 08/08/2017. PMH positive for HTN, DM2, HLD, GERD.  Clinical Impression  Patient presents with decreased independence with mobility due to deficits listed in PT problem list and will benefit from skilled PT in the acute setting to allow return home with family support, likely will not need follow up PT at d/c.     Follow Up Recommendations Supervision for mobility/OOB;No PT follow up    Equipment Recommendations  None recommended by PT    Recommendations for Other Services       Precautions / Restrictions Precautions Precautions: Fall      Mobility  Bed Mobility               General bed mobility comments: up in chair, reports needs help for getting in/out of bed  Transfers Overall transfer level: Needs assistance Equipment used: Rolling walker (2 wheeled) Transfers: Sit to/from Stand Sit to Stand: Min guard         General transfer comment: for safety  Ambulation/Gait Ambulation/Gait assistance: Min guard Ambulation Distance (Feet): 200 Feet Assistive device: Rolling walker (2 wheeled) Gait Pattern/deviations: Step-through pattern;Decreased stride length     General Gait Details: assist for balance  Stairs            Wheelchair Mobility    Modified Rankin (Stroke Patients Only)       Balance Overall balance assessment: Needs assistance Sitting-balance support: No upper extremity supported Sitting balance-Leahy Scale: Good       Standing balance-Leahy Scale: Fair Standing balance comment: can stand without UE support, walker for ambulation                              Pertinent Vitals/Pain Pain Assessment: Faces Faces Pain Scale: Hurts little more Pain Location: abdomen Pain Descriptors / Indicators: Tender Pain Intervention(s): Monitored during session    Home Living Family/patient expects to be discharged to:: Private residence Living Arrangements: Parent Available Help at Discharge: Family;Available 24 hours/day Type of Home: House Home Access: Stairs to enter Entrance Stairs-Rails: Right;Left;Can reach both Entrance Stairs-Number of Steps: 4 Home Layout: One level Home Equipment: Walker - 2 wheels;Shower seat      Prior Function Level of Independence: Independent               Hand Dominance        Extremity/Trunk Assessment   Upper Extremity Assessment Upper Extremity Assessment: Overall WFL for tasks assessed    Lower Extremity Assessment Lower Extremity Assessment: Generalized weakness       Communication   Communication: No difficulties  Cognition Arousal/Alertness: Awake/alert Behavior During Therapy: WFL for tasks assessed/performed Overall Cognitive Status: Within Functional Limits for tasks assessed                                        General Comments      Exercises     Assessment/Plan    PT Assessment Patient needs continued PT services  PT Problem List Decreased mobility;Decreased activity tolerance;Decreased knowledge of use of DME;Pain  PT Treatment Interventions Gait training;DME instruction;Functional mobility training;Balance training;Patient/family education;Stair training;Therapeutic exercise    PT Goals (Current goals can be found in the Care Plan section)  Acute Rehab PT Goals Patient Stated Goal: To return to independent PT Goal Formulation: With patient Time For Goal Achievement: 08/19/17 Potential to Achieve Goals: Good    Frequency Min 3X/week   Barriers to discharge        Co-evaluation               AM-PAC PT "6 Clicks" Daily  Activity  Outcome Measure Difficulty turning over in bed (including adjusting bedclothes, sheets and blankets)?: Unable Difficulty moving from lying on back to sitting on the side of the bed? : Unable Difficulty sitting down on and standing up from a chair with arms (e.g., wheelchair, bedside commode, etc,.)?: Unable Help needed moving to and from a bed to chair (including a wheelchair)?: A Little Help needed walking in hospital room?: A Little Help needed climbing 3-5 steps with a railing? : A Little 6 Click Score: 12    End of Session   Activity Tolerance: Patient tolerated treatment well Patient left: with call bell/phone within reach;in chair   PT Visit Diagnosis: Other abnormalities of gait and mobility (R26.89)    Time: 0762-2633 PT Time Calculation (min) (ACUTE ONLY): 13 min   Charges:   PT Evaluation $PT Eval Low Complexity: 1 Low     PT G CodesMagda Kiel, [place] [phone] 08/12/2017   Reginia Naas 08/12/2017, 2:59 PM

## 2017-08-12 NOTE — Progress Notes (Signed)
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 She presented to the hospital for [facility] reversal , she is s/p colostomy takedown with protective loop ileostomy 08/08/2017, postop she developed sinus tachycardia sometimes go up to 150, medicine consulted for tachycardia  Sinus tachycardia:  Tachycardia from dehydration, she received IV fluids, he is also started on Lopressor.  Rate as much improved. She does have suppressed TSH but free T4 unremarkable. CTA no PE CT abdomen pelvis no acute findings  Acute postop anemia Baseline hemoglobin 14.5, hgb 8.5 today, she is on oral iron supplement. Case discussed with general surgery Dr. Johney Maine who recommended avoid postop blood transfusion for now, we will reassess in a.m.  History of hypertension and diabetes resolved with diet and exercise. has been off medications for months  Prior to the surgery. Started on Lopressor.  Due to tachycardia and hypertension.  She does not appear in significant pain.   Body mass index is 26.11 kg/m.   Code Status: full  Family Communication: patient   Disposition Plan: Not ready to discharge   Consultants:  General  surgery primary  [facility] hospital[facility] as medical consultant  Procedures: s/p colostomy takedown with protective loop ileostomy 08/08/2017,  Antibiotics:  Perioperative clindamycin and gentamicin on 2/22   Objective: BP (!) 129/55 (BP Location: Left Arm)   Pulse 82   Temp 99.2 F (37.3 C) (Oral)   Resp 18   Ht 5\' 7"  (1.702 m)   Wt 75.6 kg (166 lb 11.2 oz)   SpO2 97%   BMI 26.11 kg/m   Intake/Output Summary (Last 24 hours) at 08/12/2017 1600 Last data filed at 08/12/2017 1546 Gross per 24 hour  Intake 1970 ml  Output 3421 ml  Net -1451 ml   Filed Weights   08/10/17 0441 08/11/17 0500 08/12/17 0411  Weight: 80.1 kg (176 lb 9.4 oz) 80.1 kg (176 lb 9.4 oz) 75.6 kg (166 lb 11.2 oz)    Exam: Patient is examined daily including today on 08/12/2017, exams remain the same as of yesterday except that has changed    General: Appear weak , but NAD  Cardiovascular: RRR, heart rate has much improved today  Respiratory: Diminished at bases, no rales, no rhonchi, no wheezing  Abdomen: Postop changes , midline incision C/D/I , right lower quadrant ileostomy with liquid stool output .  Mild tender at incision site , positive BS  Musculoskeletal: No Edema  Neuro: alert, oriented   Data Reviewed: Basic Metabolic Panel: Recent Labs  Lab 08/09/17 0319 08/09/17 1526 08/10/17 0517 08/11/17 0432 08/12/17 0537  NA 136 135 139 137 139  K 4.1 3.9 4.0 4.1 3.8  CL 102 102 103 103 108  CO2 22 22 28 22  20*  GLUCOSE 151* 122*  116* 109* 88  BUN 10 15 13 14 17   CREATININE 0.85 0.84 0.69 0.69 0.55  CALCIUM 7.9* 8.0* 8.2* 8.5* 8.4*  MG 1.2* 2.6* 2.3 2.0 1.9  PHOS 4.0  --  2.5 2.7 2.6   Liver Function Tests: Recent Labs  Lab 08/09/17 1526  AST 27  ALT 19  ALKPHOS 41  BILITOT 1.3*  PROT 5.2*  ALBUMIN 2.8*   No results for input(s): LIPASE, AMYLASE in the last 168 hours. No results for input(s): AMMONIA in the last 168 hours. CBC: Recent Labs  Lab 08/09/17 0319 08/09/17 1526  08/10/17 0517 08/11/17 0432 08/12/17 0537  WBC 11.9* 11.7* 10.6* 12.2* 8.7  HGB 12.3 11.1* 9.1* 8.9* 8.5*  HCT 36.4 33.2* 27.4* 27.0* 25.6*  MCV 88.6 88.3 89.8 87.4 89.2  PLT 282 235 191 209 226   Cardiac Enzymes:   No results for input(s): CKTOTAL, CKMB, CKMBINDEX, TROPONINI in the last 168 hours. BNP (last 3 results) No results for input(s): BNP in the last 8760 hours.  ProBNP (last 3 results) No results for input(s): PROBNP in the last 8760 hours.  CBG: Recent Labs  Lab 08/11/17 2118 08/12/17 0109 08/12/17 0527 08/12/17 0737 08/12/17 1134  GLUCAP 83 81 81 78 125*    No results found for this or any previous visit (from the past 240 hour(s)).   Studies: No results found.  Scheduled Meds: . acetaminophen  1,000 mg Oral TID  . enoxaparin (LOVENOX) injection  40 mg Subcutaneous Q24H  . ferrous sulfate  325 mg Oral BID WC  . gabapentin  400 mg Oral TID  . insulin aspart  0-15 Units Subcutaneous Q4H  . lip balm  1 application Topical BID  . loperamide  2 mg Oral QHS  . loratadine  10 mg Oral Daily  . metoprolol tartrate  50 mg Oral BID  . psyllium  1 packet Oral BID  . saccharomyces boulardii  250 mg Oral BID  . sodium chloride flush  3 mL Intravenous Q12H    Continuous Infusions: . sodium chloride    . lactated ringers    . methocarbamol (ROBAXIN)  IV    . sodium chloride       Time spent: 66mins, case discussed with general surgery Dr. Johney Maine I have personally reviewed and interpreted on  08/12/2017 daily labs, tele strips, imagings as discussed above under date review session and assessment and plans.  I reviewed all nursing notes,  vitals, pertinent old records  I have discussed plan of care as described above with RN , patient  on 08/12/2017   Florencia Reasons MD, PhD  [facility] Pager [phone]. If 7PM-7AM, please contact night-coverage at [email or web address], password [facility] 08/12/2017, 4:00 PM  LOS: 4 days

## 2017-08-13 DIAGNOSIS — Z9889 Other specified postprocedural states: Secondary | ICD-10-CM

## 2017-08-13 LAB — GLUCOSE, CAPILLARY
GLUCOSE-CAPILLARY: 84 mg/dL (ref 65–99)
GLUCOSE-CAPILLARY: 90 mg/dL (ref 65–99)
GLUCOSE-CAPILLARY: 97 mg/dL (ref 65–99)
Glucose-Capillary: 83 mg/dL (ref 65–99)
Glucose-Capillary: 90 mg/dL (ref 65–99)
Glucose-Capillary: 92 mg/dL (ref 65–99)
Glucose-Capillary: 114 mg/dL — ABNORMAL HIGH (ref 65–99)

## 2017-08-13 LAB — BASIC METABOLIC PANEL
ANION GAP: 8 (ref 5–15)
BUN: 11 mg/dL (ref 6–20)
CO2: 26 mmol/L (ref 22–32)
CREATININE: 0.53 mg/dL (ref 0.44–1.00)
Calcium: 8.2 mg/dL — ABNORMAL LOW (ref 8.9–10.3)
Chloride: 105 mmol/L (ref 101–111)
GFR calc non Af Amer: >60 mL/min (ref >60)
Glucose, Bld: 91 mg/dL (ref 65–99)
Potassium: 3.5 mmol/L (ref 3.5–5.1)
SODIUM: 139 mmol/L (ref 135–145)

## 2017-08-13 LAB — CBC
HCT: 27 % — ABNORMAL LOW (ref 36.0–46.0)
Hemoglobin: 9 g/dL — ABNORMAL LOW (ref 12.0–15.0)
MCH: 29.7 pg (ref 26.0–34.0)
MCHC: 33.3 g/dL (ref 30.0–36.0)
MCV: 89.1 fL (ref 78.0–100.0)
Platelets: 239 10*3/uL (ref 150–400)
RBC: 3.03 MIL/uL — AB (ref 3.87–5.11)
RDW: 13.2 % (ref 11.5–15.5)
WBC: 8.7 10*3/uL (ref 4.0–10.5)

## 2017-08-13 LAB — MAGNESIUM: MAGNESIUM: 1.9 mg/dL (ref 1.7–2.4)

## 2017-08-13 LAB — PHOSPHORUS: PHOSPHORUS: 3.1 mg/dL (ref 2.5–4.6)

## 2017-08-13 MED ORDER — METHOCARBAMOL 500 MG PO TABS
750.0000 mg | ORAL_TABLET | Freq: Three times a day (TID) | ORAL | Status: DC
  Administered 2017-08-13 – 2017-08-15 (×7): 750 mg via ORAL
  Filled 2017-08-13 (×7): qty 2

## 2017-08-13 MED ORDER — METOCLOPRAMIDE HCL 5 MG PO TABS
5.0000 mg | ORAL_TABLET | Freq: Three times a day (TID) | ORAL | Status: DC
  Administered 2017-08-13 – 2017-08-15 (×7): 5 mg via ORAL
  Filled 2017-08-13 (×7): qty 1

## 2017-08-13 MED ORDER — PANTOPRAZOLE SODIUM 40 MG PO TBEC
40.0000 mg | DELAYED_RELEASE_TABLET | Freq: Every day | ORAL | Status: DC
  Administered 2017-08-13 – 2017-08-15 (×3): 40 mg via ORAL
  Filled 2017-08-13 (×3): qty 1

## 2017-08-13 NOTE — Consult Note (Signed)
Visit made to continue education on Ileostomy. Patient refused visit at this time and states she will let the nurse know when she feels like seeing someone for education. All lights off, blinds closed. Bedside RN asked to call me when pt is willing to have visit.  Fara Olden, RN-C, WTA-C, [place] [facility] Ostomy Care Associate

## 2017-08-13 NOTE — Progress Notes (Signed)
[address] [place], [place] [address] Phone: [phone]  FAX: [phone]      Wanda Baker [id number] [birth year]  CARE TEAM:  PCP: Tonette Bihari, MD  Outpatient Care Team: Patient Care Team: Tonette Bihari, MD as PCP - General (Family Medicine) Ileana Roup, MD as Consulting Physician (General Surgery) Fanny Skates, MD as Consulting Physician (General Surgery) Mauri Pole, MD as Consulting Physician (Gastroenterology)  Inpatient Treatment Team: Treatment Team: Attending Provider: Michael Boston, MD; Technician: Abbe Amsterdam, NT; Technician: Sueanne Margarita, NT; Registered Nurse: Illene Regulus, RN; Registered Nurse: Jeannie Fend, RN; Consulting Physician: Michael Boston, MD; Technician: Layla Maw, NT; Consulting Physician: Garner Gavel, MD; Registered Nurse: Gentry Roch, RN; Technician: Darrick Grinder, NT; Registered Nurse: Mortimer Fries, RN; Technician: Shona Needles, NT; Registered Nurse: Layla Maw, RN   Problem List:   Principal Problem:   S/p colostomy takedown with protective loop ileostomy 08/08/2017 Active Problems:   Rectal cancer ([place])   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 Exploratory laparotomy 2. Lysis of adhesions x60 minutes 3. Takedown of splenic flexure 4. Takedown of colostomy with stapled colorectal anastomosis 5. Small bowel resection with enteroenterostomy 6. Flexible sigmoidoscopy 7. Creation of  ileostomy  SURGEON:  Sharon Mt. Dema Severin, MD  ASSISTANT: Michael Boston, MD - an MD assistant was necessary for complex pelvic exposure, complex retraction and additionally for creation of a stapled colorectal anastomosis.     Assessment  "I'm okay, dear'"  Some nausea but Ileus resolving.  Tachycardia resolving.  Plan:  -d/c tele - transfer to surgery floor -Solid diet.  -add Reglan short term for bloating/nausea - watch out for diarrhea -ileostomy care -bowel regimen for ileostomy -follow off IVF w aggressive PRN boluses -lytes OK - follow -inc PO pain control - scheduled tylenol.  Inc gabapentin to 400 TID.  No NSAIDs - GI upset.  Add sch robaxin.  PRN ice/heat/tramadol/dilaudid. -acute postop on chronic anemia - PO iron & follow.  Hgb 9 & no hypoTN.  Mild orthostasis stabilizing  If othostsatic or Hgb drops <7, may consider blood transfusion - less likely now -f/u pathology -DM control - SSI -GERD - add PPI.  Avoid NSAIDs.  Maalox PRN -keep drain - most likely d/c on d/c home -VTE prophylaxis- SCDs, lovenox -mobilize as tolerated to help recovery.  GET HER UP!    25 minutes spent in review, evaluation, examination, counseling, and coordination of care.  More than 50% of that time was spent in counseling.  [name], M.D., F.A.C.S. Gastrointestinal and Minimally Invasive Surgery [facility] [address] [place], [place] [address] [phone] Main / Paging   08/13/2017    Subjective: (Chief complaint)  No major telemetry events Tool liquids but occasional nausea Walked in hallways x1 Pain mostly controlled - h/o chronic pain.  Ibuprofen gave stomach pain  Objective:  Vital signs:  Vitals:   08/12/17 1335 08/12/17 2011 08/13/17 0423 08/13/17 0424  BP: (!) 129/55 [name] Kitchen)  151/75 131/60   Pulse: 82 99 90   Resp: _0 Temp: 99.2 F (37.3 C) 99.2 F (37.3 C) 98 F (36.7 C)   TempSrc: Oral Oral Oral   SpO2:  97% 95% 96%   Weight:    74.1 kg (163 lb 6.4 oz)  Height:        Last BM Date: 08/11/17  Intake/Output   Yesterday:  02/26 0701 - 02/27 0700 In: 480 [P.O.:480] Out: 1290 [Drains:15; CWUGQ:[id number]] This shift:  No intake/output data recorded.  Bowel function:  Flatus: YES  BM:  YES - thin green  Drain: Serosanguinous   Physical Exam:  General: Pt awake/alert/oriented x4 in no acute distress.  Tired but not toxic Eyes: PERRL, normal EOM.  Sclera clear.  No icterus Neuro: CN II-XII intact w/o focal sensory/motor deficits. Lymph: No head/neck/groin lymphadenopathy Psych:  No delerium/psychosis/paranoia HENT: Normocephalic, Mucus membranes moist.  No thrush Neck: Supple, No tracheal deviation Chest: No chest wall pain w good excursion CV:  Pulses intact.  Regular rhythm MS: Normal AROM mjr joints.  No obvious deformity  Abdomen: Soft.  Mildy distended.  Mildly tender at incisions only.  RLQ ileostomy very mild duskiness.  Midline incision c/d/i.  LLQ old colostomy wound clean.  No evidence of peritonitis.  No incarcerated hernias.  Ext:  No deformity.  No mjr edema.  No cyanosis Skin: No petechiae / purpura  Results:   Labs: Results for orders placed or performed during the hospital encounter of 08/08/17 (from the past 48 hour(s))  Glucose, capillary     Status: None   Collection Time: 08/11/17 12:05 PM  Result Value Ref Range   Glucose-Capillary 97 65 - 99 mg/dL  TSH     Status: Abnormal   Collection Time: 08/11/17  2:06 PM  Result Value Ref Range   TSH 0.249 (L) 0.350 - 4.500 uIU/mL    Comment: Performed by a 3rd Generation assay with a functional sensitivity of <=0.01 uIU/mL. Performed at [facility], [place] [address], [place], [place] [address]   T4, free     Status: None   Collection Time: 08/11/17  2:06 PM  Result Value Ref Range   Free T4 0.90 0.61 - 1.12 ng/dL    Comment: (NOTE) Biotin ingestion may interfere with free T4 tests. If the  results are inconsistent with the TSH level, previous test results, or the clinical presentation, then consider biotin interference. If needed, order repeat testing after stopping biotin. Performed at [facility], [place] [address], [place], [place] [address]   Lactic acid, plasma     Status: None   Collection Time: 08/11/17  2:06 PM  Result Value Ref Range   Lactic Acid, Venous 0.8 0.5 - 1.9 mmol/L    Comment: Performed at [facility], [place] [address], [place], [place] [address]  Glucose, capillary     Status: None   Collection Time: 08/11/17  4:26 PM  Result Value Ref Range   Glucose-Capillary 90 65 - 99 mg/dL  Lactic acid, plasma     Status: None   Collection Time: 08/11/17  4:32 PM  Result Value Ref Range   Lactic Acid, Venous 0.9 0.5 - 1.9 mmol/L    Comment: Performed at [facility], [place] [address], [place], [place] [address]  Glucose, capillary     Status: None   Collection Time: 08/11/17  9:18 PM  Result Value Ref Range   Glucose-Capillary 83 65 -  99 mg/dL  Glucose, capillary     Status: None   Collection Time: 08/12/17  1:09 AM  Result Value Ref Range   Glucose-Capillary 81 65 - 99 mg/dL  Glucose, capillary     Status: None   Collection Time: 08/12/17  5:27 AM  Result Value Ref Range   Glucose-Capillary 81 65 - 99 mg/dL  CBC     Status: Abnormal   Collection Time: 08/12/17  5:37 AM  Result Value Ref Range   WBC 8.7 4.0 - 10.5 K/uL   RBC 2.87 (L) 3.87 - 5.11 MIL/uL   Hemoglobin 8.5 (L) 12.0 - 15.0 g/dL   HCT 25.6 (L) 36.0 - 46.0 %   MCV 89.2 78.0 - 100.0 fL   MCH 29.6 26.0 - 34.0 pg   MCHC 33.2 30.0 - 36.0 g/dL   RDW 13.1 11.5 - 15.5 %   Platelets 226 150 - 400 K/uL    Comment: Performed at [facility], [place] [address] [place], [place] [address]  Basic metabolic panel     Status: Abnormal   Collection Time: 08/12/17  5:37 AM  Result Value Ref Range   Sodium 139 135 - 145 mmol/L   Potassium 3.8 3.5  - 5.1 mmol/L   Chloride 108 101 - 111 mmol/L   CO2 20 (L) 22 - 32 mmol/L   Glucose, Bld 88 65 - 99 mg/dL   BUN 17 6 - 20 mg/dL   Creatinine, Ser 0.55 0.44 - 1.00 mg/dL   Calcium 8.4 (L) 8.9 - 10.3 mg/dL   GFR calc non Af Amer >60 >60 mL/min   GFR calc Af Amer >60 >60 mL/min    Comment: (NOTE) The eGFR has been calculated using the CKD EPI equation. This calculation has not been validated in all clinical situations. eGFR's persistently <60 mL/min signify possible Chronic Kidney Disease.    Anion gap 11 5 - 15    Comment: Performed at [facility], [place] [address], [place], [place] [address]  Magnesium     Status: None   Collection Time: 08/12/17  5:37 AM  Result Value Ref Range   Magnesium 1.9 1.7 - 2.4 mg/dL    Comment: Performed at [facility], [place] [address], [place], [place] [address]  Phosphorus     Status: None   Collection Time: 08/12/17  5:37 AM  Result Value Ref Range   Phosphorus 2.6 2.5 - 4.6 mg/dL    Comment: Performed at [facility], [place] [address], [place], [place] [address]  Glucose, capillary     Status: None   Collection Time: 08/12/17  7:37 AM  Result Value Ref Range   Glucose-Capillary 78 65 - 99 mg/dL  Glucose, capillary     Status: Abnormal   Collection Time: 08/12/17 11:34 AM  Result Value Ref Range   Glucose-Capillary 125 (H) 65 - 99 mg/dL  Glucose, capillary     Status: None   Collection Time: 08/12/17  5:43 PM  Result Value Ref Range   Glucose-Capillary 75 65 - 99 mg/dL  Glucose, capillary     Status: Abnormal   Collection Time: 08/12/17  8:09 PM  Result Value Ref Range   Glucose-Capillary 113 (H) 65 - 99 mg/dL  Glucose, capillary     Status: None   Collection Time: 08/13/17 12:12 AM  Result Value Ref Range   Glucose-Capillary 97 65 - 99 mg/dL  Glucose, capillary     Status: None   Collection  Time: 08/13/17  4:23 AM  Result Value Ref Range   Glucose-Capillary 83 65 - 99 mg/dL  CBC      Status: Abnormal   Collection Time: 08/13/17  5:34 AM  Result Value Ref Range   WBC 8.7 4.0 - 10.5 K/uL   RBC 3.03 (L) 3.87 - 5.11 MIL/uL   Hemoglobin 9.0 (L) 12.0 - 15.0 g/dL   HCT 27.0 (L) 36.0 - 46.0 %   MCV 89.1 78.0 - 100.0 fL   MCH 29.7 26.0 - 34.0 pg   MCHC 33.3 30.0 - 36.0 g/dL   RDW 13.2 11.5 - 15.5 %   Platelets 239 150 - 400 K/uL    Comment: Performed at [facility], [place] [address], [place], [place] [address]  Basic metabolic panel     Status: Abnormal   Collection Time: 08/13/17  5:34 AM  Result Value Ref Range   Sodium 139 135 - 145 mmol/L   Potassium 3.5 3.5 - 5.1 mmol/L   Chloride 105 101 - 111 mmol/L   CO2 26 22 - 32 mmol/L   Glucose, Bld 91 65 - 99 mg/dL   BUN 11 6 - 20 mg/dL   Creatinine, Ser 0.53 0.44 - 1.00 mg/dL   Calcium 8.2 (L) 8.9 - 10.3 mg/dL   GFR calc non Af Amer >60 >60 mL/min   GFR calc Af Amer >60 >60 mL/min    Comment: (NOTE) The eGFR has been calculated using the CKD EPI equation. This calculation has not been validated in all clinical situations. eGFR's persistently <60 mL/min signify possible Chronic Kidney Disease.    Anion gap 8 5 - 15    Comment: Performed at [facility], [place] [address] [place], [place] [address]  Magnesium     Status: None   Collection Time: 08/13/17  5:34 AM  Result Value Ref Range   Magnesium 1.9 1.7 - 2.4 mg/dL    Comment: Performed at [facility], [place] [address] [place], [place] [address]  Phosphorus     Status: None   Collection Time: 08/13/17  5:34 AM  Result Value Ref Range   Phosphorus 3.1 2.5 - 4.6 mg/dL    Comment: Performed at [facility], [place] [address], [place], [place] [address]  Glucose, capillary     Status: None   Collection Time: 08/13/17  7:42 AM  Result Value Ref Range   Glucose-Capillary 84 65 - 99 mg/dL    Imaging / Studies: Ct Abdomen Pelvis W Contrast  Result Date: 08/11/2017 CLINICAL DATA:  Abdominal  distension. EXAM: CT ABDOMEN AND PELVIS WITH CONTRAST TECHNIQUE: Multidetector CT imaging of the abdomen and pelvis was performed using the standard protocol following bolus administration of intravenous contrast. CONTRAST:  57m ISOVUE-300 IOPAMIDOL (ISOVUE-300) INJECTION 61% orally, 1058mISOVUE-300 IOPAMIDOL (ISOVUE-300) INJECTION 61% intravenously. COMPARISON:  CT scan of September 16, 2016. FINDINGS: Lower chest: Mild bilateral pleural effusions are noted with adjacent subsegmental atelectasis. Hepatobiliary: No gallstones are noted. Fatty infiltration of the liver is noted. Pancreas: Unremarkable. No pancreatic ductal dilatation or surrounding inflammatory changes. Spleen: Normal in size without focal abnormality. Adrenals/Urinary Tract: Adrenal glands appear normal. Left kidney and ureter are unremarkable. Mild right hydroureteronephrosis is noted without evidence of obstructive calculus. Urinary bladder is decompressed secondary to Foley catheter. Stomach/Bowel: Postsurgical changes are seen in the sigmoid rectum region. Ileostomy is noted in the right lower quadrant. Mildly dilated small bowel loops are noted which may represent postoperative ileus. Vascular/Lymphatic: Aortic  atherosclerosis. No enlarged abdominal or pelvic lymph nodes. Reproductive: Status post hysterectomy. No adnexal masses. Other: Surgical drain is seen involving the left lower quadrant of the abdomen with distal tip in the pelvis. Mild anasarca is noted. Old colostomy site is seen in the left lower quadrant site. Minimal amount of free fluid is noted in the pelvis and pericolic gutters. Musculoskeletal: No acute or significant osseous findings. IMPRESSION: Mild bilateral pleural effusions with adjacent subsegmental atelectasis. Postsurgical changes are seen in the rectosigmoid region. Ileostomy is noted in right lower quadrant. Old colostomy site is seen in the left lower quadrant. Mildly dilated small bowel loops are noted which most  likely represent postoperative ileus. Fatty infiltration of the liver. Aortic Atherosclerosis (ICD10-I70.0). Electronically Signed   By: Marijo Conception, M.D.   On: 08/11/2017 14:12    Medications / Allergies: per chart  Antibiotics: Anti-infectives (From admission, onward)   Start     Dose/Rate Route Frequency Ordered Stop   08/08/17 1346  clindamycin (CLEOCIN) 900 MG/50ML IVPB    Comments:  Whitlow, Cheryl   : cabinet override      08/08/17 1346 08/08/17 1352   08/08/17 0600  clindamycin (CLEOCIN) IVPB 900 mg     900 mg 100 mL/hr over 30 Minutes Intravenous 30 min pre-op 08/07/17 1325 08/08/17 1352   08/08/17 0600  gentamicin (GARAMYCIN) 360 mg in dextrose 5 % 100 mL IVPB     360 mg 218 mL/hr over 30 Minutes Intravenous 30 min pre-op 08/07/17 1325 08/08/17 0815   08/08/17 0552  neomycin (MYCIFRADIN) tablet 1,000 mg  Status:  Discontinued     1,000 mg Oral 3 times per day 08/08/17 0552 08/08/17 1953   08/08/17 0552  metroNIDAZOLE (FLAGYL) tablet 1,000 mg  Status:  Discontinued     1,000 mg Oral 3 times per day 08/08/17 6386 08/08/17 1953        Note: Portions of this report may have been transcribed using voice recognition software. Every effort was made to ensure accuracy; however, inadvertent computerized transcription errors may be present.   Any transcriptional errors that result from this process are unintentional.     [name], M.D., F.A.C.S. Gastrointestinal and Minimally Invasive Surgery [facility] [address], [place], [place] [address] [phone] Main / Paging   08/13/2017

## 2017-08-13 NOTE — Consult Note (Signed)
[place] Nurse ostomy follow up Stoma type/location: RUQ Ileostomy. Patient independent with prior ostomy.Pt request to come back after lunch. Educated patient on need to change pouch prior to eating and drinking so that the effluent will be less therefore making it easier to apply pouch without stool actively coming out. Educated on why this is the case with an ileostomy and not with a colostomy.  Stomal assessment/size: 1 1/2" pink, round, moist, with area from 5-9 o'clock with slough.  Peristomal assessment: has partial thickness wound on medial proximal corner. Educated patient on cutting outer edge of barrier tape to avoid this area. Treatment options for stomal/peristomal skin: barrier ring, cut out of upper edge where partial thickness wound present Output 50cc dark green effluent Ostomy pouching: 1pc convex opaque pouch with window Education provided: Patient very knowledgeable about ostomy care due to previous colostomy. States she does not need all these visits and all this teaching. She states she is very nauseated, however, she ordered her lunch before we started changing her pouch and is now sitting up eating it.  Enrolled patient in [place] Start Discharge program: Yes [place] team will follow daily due to expecting to go home soon.  Fara Olden, RN-C, WTA-C, [place] Wound Treatment Associate Ostomy Care Associate

## 2017-08-13 NOTE — Progress Notes (Signed)
[facility] Consultation Progress Note  Patient: Wanda Baker ZTI:[id number]   PCP: Tonette Bihari, MD DOB: [birth year]   DOA: 08/08/2017   DOS: 08/13/2017   Date of Service: the patient was seen and examined on 08/13/2017 Primary service: Michael Boston, MD  Subjective: Feeling better, denies any acute complaint of nausea vomiting shortness of breath dizziness lightheadedness.  Pain is well controlled.  No fever no chills overnight.  Brief hospital course: History of colon cancer status post resection and Hartmann pouch in 09/2016, she finished adjuvant chemoradiation in November 2018 . She presented to the hospital for [facility] reversal , she is s/p colostomy takedown with protective loop ileostomy 08/08/2017, postop she developed sinus tachycardia sometimes go up to 150, medicine consulted for tachycardia. Currently further plan is continue advancing the diet.  Assessment and Plan: Sinus tachycardia Tachycardia from dehydration, she received IV fluids, he is also started on Lopressor.  Rate as much improved. She does have suppressed TSH but free T4 unremarkable. CTA chest no PE CT abdomen pelvis no acute findings  Acute postop anemia Baseline hemoglobin 14.5, hgb 9 today, she is on oral iron supplement. Hold off on blood transfusion unless symptomatic.  hypertension and diabetes resolved with diet and exercise. has been off medications for months  Prior to the surgery. Started on Lopressor.  Due to tachycardia and hypertension.  She does not appear in significant pain.  Diet: per General surgery  DVT Prophylaxis: subcutaneous Heparin  Family Communication: no family was present at bedside, at the time of interview.  Disposition: We will continue to follow the patient. But pt is medically stable.   Recommendation on discharge: Continue lopressor  Other Consultants: none PROCEDURE: 1. Exploratory laparotomy 2. Lysis of adhesions x60 minutes 3. Takedown of  splenic flexure 4. Takedown of colostomy with stapled colorectal anastomosis 5. Small bowel resection with enteroenterostomy 6. Flexible sigmoidoscopy 7. Creation of ileostomy   Antibiotics: Anti-infectives (From admission, onward)   Start     Dose/Rate Route Frequency Ordered Stop   08/08/17 1346  clindamycin (CLEOCIN) 900 MG/50ML IVPB    Comments:  [place] Session   : cabinet override      08/08/17 1346 08/08/17 1352   08/08/17 0600  clindamycin (CLEOCIN) IVPB 900 mg     900 mg 100 mL/hr over 30 Minutes Intravenous 30 min pre-op 08/07/17 1325 08/08/17 1352   08/08/17 0600  gentamicin (GARAMYCIN) 360 mg in dextrose 5 % 100 mL IVPB     360 mg 218 mL/hr over 30 Minutes Intravenous 30 min pre-op 08/07/17 1325 08/08/17 0815   08/08/17 0552  neomycin (MYCIFRADIN) tablet 1,000 mg  Status:  Discontinued     1,000 mg Oral 3 times per day 08/08/17 0552 08/08/17 1953   08/08/17 0552  metroNIDAZOLE (FLAGYL) tablet 1,000 mg  Status:  Discontinued     1,000 mg Oral 3 times per day 08/08/17 0552 08/08/17 1953      Objective: Physical Exam: Vitals:   08/12/17 2011 08/13/17 0423 08/13/17 0424 08/13/17 1323  BP: (!) 151/75 131/60  (!) 148/79  Pulse: 99 90  91  Resp: 18 17  18   Temp: 99.2 F (37.3 C) 98 F (36.7 C)  98.3 F (36.8 C)  TempSrc: Oral Oral  Oral  SpO2: 95% 96%  96%  Weight:   74.1 kg (163 lb 6.4 oz)   Height:        Intake/Output Summary (Last 24 hours) at 08/13/2017 1630 Last data filed at 08/13/2017 1324  Gross per 24 hour  Intake 360 ml  Output 730 ml  Net -370 ml   Filed Weights   08/11/17 0500 08/12/17 0411 08/13/17 0424  Weight: 80.1 kg (176 lb 9.4 oz) 75.6 kg (166 lb 11.2 oz) 74.1 kg (163 lb 6.4 oz)   General: Alert, Awake and Oriented to Time, Place and Person. Appear in mild distress, affect appropriate Eyes: PERRL, Conjunctiva normal ENT: Oral Mucosa clear moist. Neck: no JVD, no Abnormal Mass Or lumps Cardiovascular: S1 and S2 Present, no Murmur,  Peripheral Pulses Present Respiratory: normal respiratory effort, Bilateral Air entry equal and Decreased, no use of accessory muscle, Clear to Auscultation, no Crackles, no wheezes Abdomen: Bowel Sound present, Soft and mild tenderness, no hernia Skin: no redness, no Rash, no induration Extremities: no Pedal edema, no calf tenderness Neurologic: Grossly no focal neuro deficit. Bilaterally Equal motor strength   Data Reviewed: CBC: Recent Labs  Lab 08/09/17 1526 08/10/17 0517 08/11/17 0432 08/12/17 0537 08/13/17 0534  WBC 11.7* 10.6* 12.2* 8.7 8.7  HGB 11.1* 9.1* 8.9* 8.5* 9.0*  HCT 33.2* 27.4* 27.0* 25.6* 27.0*  MCV 88.3 89.8 87.4 89.2 89.1  PLT 235 191 209 226 585   Basic Metabolic Panel: Recent Labs  Lab 08/09/17 0319 08/09/17 1526 08/10/17 0517 08/11/17 0432 08/12/17 0537 08/13/17 0534  NA 136 135 139 137 139 139  K 4.1 3.9 4.0 4.1 3.8 3.5  CL 102 102 103 103 108 105  CO2 22 22 28 22  20* 26  GLUCOSE 151* 122* 116* 109* 88 91  BUN 10 15 13 14 17 11   CREATININE 0.85 0.84 0.69 0.69 0.55 0.53  CALCIUM 7.9* 8.0* 8.2* 8.5* 8.4* 8.2*  MG 1.2* 2.6* 2.3 2.0 1.9 1.9  PHOS 4.0  --  2.5 2.7 2.6 3.1   Liver Function Tests: Recent Labs  Lab 08/09/17 1526  AST 27  ALT 19  ALKPHOS 41  BILITOT 1.3*  PROT 5.2*  ALBUMIN 2.8*   No results for input(s): LIPASE, AMYLASE in the last 168 hours. No results for input(s): AMMONIA in the last 168 hours. Cardiac Enzymes: No results for input(s): CKTOTAL, CKMB, CKMBINDEX, TROPONINI in the last 168 hours. BNP (last 3 results) No results for input(s): BNP in the last 8760 hours. CBG: Recent Labs  Lab 08/13/17 0012 08/13/17 0423 08/13/17 0742 08/13/17 1200 08/13/17 1558  GLUCAP 97 83 84 90 92   No results found for this or any previous visit (from the past 240 hour(s)).  Studies: No results found.   Scheduled Meds: . acetaminophen  1,000 mg Oral TID  . enoxaparin (LOVENOX) injection  40 mg Subcutaneous Q24H  . ferrous  sulfate  325 mg Oral BID WC  . gabapentin  400 mg Oral TID  . insulin aspart  0-15 Units Subcutaneous Q4H  . lip balm  1 application Topical BID  . loratadine  10 mg Oral Daily  . methocarbamol  750 mg Oral TID  . metoCLOPramide  5 mg Oral TID AC  . metoprolol tartrate  50 mg Oral BID  . pantoprazole  40 mg Oral Q1200  . psyllium  1 packet Oral BID  . saccharomyces boulardii  250 mg Oral BID  . sodium chloride flush  3 mL Intravenous Q12H   Continuous Infusions: . sodium chloride    . lactated ringers    . methocarbamol (ROBAXIN)  IV    . sodium chloride     PRN Meds: sodium chloride, alum & mag hydroxide-simeth, diphenhydrAMINE **OR** diphenhydrAMINE,  guaiFENesin-dextromethorphan, hydrocortisone, hydrocortisone cream, HYDROmorphone (DILAUDID) injection, iopamidol, lactated ringers, loperamide, magic mouthwash, menthol-cetylpyridinium, methocarbamol (ROBAXIN)  IV, metoprolol tartrate, ondansetron **OR** ondansetron (ZOFRAN) IV, phenol, promethazine, sodium chloride, sodium chloride flush, traMADol  Time spent: 30 minutes  Author: Berle Mull, MD [facility] Pager: [phone] 08/13/2017 4:30 PM  If 7PM-7AM, please contact night-coverage at [email or web address], password [facility]

## 2017-08-13 NOTE — Progress Notes (Signed)
Report called to [facility] RN on 5W. Hospital course and plan of care reviewed. Transferred to 1525.

## 2017-08-13 NOTE — Evaluation (Signed)
Occupational Therapy Evaluation Patient Details Name: Wanda Baker MRN: [id number] DOB: 1961-02-21 Today's Date: 08/13/2017    History of Present Illness Wanda Baker is a 57 y.o. female with medical history significant of adenocarcinoma of the colon status post exploratory lap, small bowel resection, adhesiolysis, Hartmann's takedown with stapled low colorectal anastomosis on 08/08/2017. PMH positive for HTN, DM2, HLD, GERD.   Clinical Impression   OT education complete. Pt I with simple ADL activity.Pts mother will be with pt 24/7 initially.  Pt has not further questions for OT.    Follow Up Recommendations  No OT follow up    Equipment Recommendations  None recommended by OT    Recommendations for Other Services       Precautions / Restrictions Precautions Precautions: Fall      Mobility Bed Mobility Overal bed mobility: Independent             General bed mobility comments: increased time  Transfers Overall transfer level: Independent                    Balance Overall balance assessment: Independent   Sitting balance-Leahy Scale: Good     Standing balance support: Bilateral upper extremity supported                               ADL either performed or assessed with clinical judgement   ADL Overall ADL's : Independent                                       General ADL Comments: with increased time.  Pts needed VC with LB dressing but stated her mother would A her at home.       Vision Patient Visual Report: No change from baseline       Perception     Praxis      Pertinent Vitals/Pain Pain Score: 2  Pain Location: abdomen Pain Descriptors / Indicators: Tender Pain Intervention(s): Limited activity within patient's tolerance           Communication Communication Communication: No difficulties   Cognition Arousal/Alertness: Awake/alert Behavior During Therapy: WFL for tasks  assessed/performed Overall Cognitive Status: Within Functional Limits for tasks assessed                                                Home Living Family/patient expects to be discharged to:: Private residence Living Arrangements: Parent Available Help at Discharge: Family;Available 24 hours/day Type of Home: House Home Access: Stairs to enter [employer] of Steps: 4 Entrance Stairs-Rails: Right;Left;Can reach both Home Layout: One level     Bathroom Shower/Tub: Teacher, early years/pre: Standard     Home Equipment: Environmental consultant - 2 wheels;Shower seat          Prior Functioning/Environment Level of Independence: Independent                          OT Goals(Current goals can be found in the care plan section) Acute Rehab OT Goals Patient Stated Goal: To return to independent OT Goal Formulation: With patient  OT Frequency:     Barriers to D/C:  End of Session Equipment Utilized During Treatment: Surveyor, mining Communication: Mobility status  Activity Tolerance: Patient tolerated treatment well Patient left: in chair;with call bell/phone within reach                   Time: 1202-1214 OT Time Calculation (min): 12 min Charges:  OT General Charges $OT Visit: 1 Visit OT Evaluation $OT Eval Moderate Complexity: 1 Mod G-Codes:     Kari Baars, OT [phone]  Payton Mccallum D 08/13/2017, 1:10 PM

## 2017-08-14 LAB — GLUCOSE, CAPILLARY
GLUCOSE-CAPILLARY: 109 mg/dL — AB (ref 65–99)
Glucose-Capillary: 105 mg/dL — ABNORMAL HIGH (ref 65–99)
Glucose-Capillary: 124 mg/dL — ABNORMAL HIGH (ref 65–99)
Glucose-Capillary: 73 mg/dL (ref 65–99)
Glucose-Capillary: 90 mg/dL (ref 65–99)
Glucose-Capillary: 92 mg/dL (ref 65–99)

## 2017-08-14 MED ORDER — GABAPENTIN 400 MG PO CAPS: 400.0000 mg | ORAL_CAPSULE | Freq: Three times a day (TID) | ORAL | 0 refills | Status: DC

## 2017-08-14 MED ORDER — PANTOPRAZOLE SODIUM 40 MG PO TBEC: 40.0000 mg | DELAYED_RELEASE_TABLET | Freq: Every day | ORAL | 0 refills | Status: DC

## 2017-08-14 MED ORDER — METOPROLOL TARTRATE 50 MG PO TABS: 50.0000 mg | ORAL_TABLET | Freq: Two times a day (BID) | ORAL | 0 refills | Status: DC

## 2017-08-14 MED ORDER — SACCHAROMYCES BOULARDII 250 MG PO CAPS: 250.0000 mg | ORAL_CAPSULE | Freq: Two times a day (BID) | ORAL | 0 refills | Status: AC

## 2017-08-14 MED ORDER — LACTATED RINGERS IV BOLUS (SEPSIS): 1000.0000 mL | Freq: Three times a day (TID) | INTRAVENOUS | Status: DC | PRN

## 2017-08-14 MED ORDER — FERROUS SULFATE 325 (65 FE) MG PO TABS: 325.0000 mg | ORAL_TABLET | Freq: Two times a day (BID) | ORAL | 0 refills | Status: DC

## 2017-08-14 NOTE — Consult Note (Signed)
[facility] Nurse ostomy follow up Stoma type/location: RUQ Ileostomy Stomal assessment/size: measured yesterday Peristomal assessment: not assessed today, pouch intact Treatment options for stomal/peristomal skin: barrier ring Output 200cc dark green Ostomy pouching: 1pc.convex opaque pouch with viewing window  Education provided: Educated on diet differences with ileostomy diet, why different from colostomy diet. Printed out education sheet left with patient for further review. Enrolled patient in [place] Discharge program:prior  [place] team will follow this patient and remain available to this patient, nursing, and the medical and surgical teams.  Fara Olden, RN-C, WTA-C, [place] Wound Treatment Associate Ostomy Care Associate

## 2017-08-14 NOTE — Progress Notes (Signed)
Wanda  [address]aker[address] [place], [place] [address] Phone: [phone]  FAX: [phone]      KELLER MIKELS [id number] 1961/06/10  CARE TEAM:  PCP: Tonette Bihari, MD  Outpatient Care Team: Patient Care Team: Tonette Bihari, MD as PCP - General (Family Medicine) Ileana Roup, MD as Consulting Physician (General Surgery) Fanny Skates, MD as Consulting Physician (General Surgery) Mauri Pole, MD as Consulting Physician (Gastroenterology)  Inpatient Treatment Team: Treatment Team: Attending Provider: Michael Boston, MD; Technician: Abbe Amsterdam, NT; Technician: Sueanne Margarita, NT; Registered Nurse: Illene Regulus, RN; Registered Nurse: Jeannie Fend, RN; Consulting Physician: Michael Boston, MD; Technician: Layla Maw, NT; Consulting Physician: Garner Gavel, MD; Registered Nurse: Gentry Roch, RN; Technician: Wilder Glade, NT; Registered Nurse: Burnadette Pop D, RN; Technician: Etheleen Sia, [place]; Registered Nurse: Herma Carson, RN; Physical Therapist: Neil Crouch, PT   Problem List:   Principal Problem:   S/p colostomy takedown with protective loop ileostomy 08/08/2017 Active Problems:   Rectal cancer ([place])   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   Ileostomy in place for fecal diversion   HYPERTENSION, BENIGN ESSENTIAL   Diabetes type 2, controlled ([place])   Sinus tachycardia   Anemia of chronic disease   Hyperlipidemia   GERD (gastroesophageal reflux disease)   Chronic back pain   Weakness   6 Days Post-Op  08/08/2017  POST-OPERATIVE DIAGNOSIS:  History of rectal cancer s/p Hartmann's procedure  PROCEDURE:   1. Exploratory laparotomy 2. Lysis of adhesions x60 minutes 3. Takedown of splenic flexure 4. Takedown of colostomy with stapled colorectal anastomosis 5. Small bowel resection with enteroenterostomy 6. Flexible  sigmoidoscopy 7. Creation of ileostomy  SURGEON:  Sharon Mt. Dema Severin, MD  ASSISTANT: Michael Boston, MD - an MD assistant was necessary for complex pelvic exposure, complex retraction and additionally for creation of a stapled colorectal anastomosis.     Assessment  "I'm okay, dear'"  Some nausea but Ileus resolving.  Tachycardia resolving.  Plan:  -Solid diet.  -Reglan short term for bloating/nausea - watch out for diarrhea.  Improved -ileostomy care -bowel regimen for ileostomy -follow off IVF w aggressive PRN boluses -lytes OK - follow -PO pain control - scheduled tylenol.  Inc gabapentin to 400 TID.  No NSAIDs - GI upset.  Add sch robaxin.  PRN ice/heat/tramadol/dilaudid. -acute postop on chronic anemia - PO iron & follow.  Hgb 9 & no hypoTN.  Mild orthostasis stabilizing  If othostsatic or Hgb drops <7, may consider blood transfusion - less likely now -f/u pathology -DM control - SSI -GERD - PPI.  Avoid NSAIDs.  Maalox PRN -d/c drain -VTE prophylaxis- SCDs, lovenox -mobilize as tolerated to help recovery.  GET HER UP!    25 minutes spent in review, evaluation, examination, counseling, and coordination of care.  More than 50% of that time was spent in counseling.  Wanda Baker, M.D., F.A.C.S. Gastrointestinal and Minimally Invasive Surgery [facility] [address], [place], [place] [address] [phone] Main / Paging   08/14/2017    Subjective: (Chief complaint)  Transferred off tele IN room only  Less nausea Pain mostly controlled - h/o chronic pain.    Objective:  Vital signs:  Vitals:   08/13/17 0424 08/13/17 1323 08/13/17 2034 08/14/17 0429  BP:  (!) 148/79 (!) 144/75 125/72  Pulse:  91 (!) 115 97  Resp:  18 18  18  Temp:  98.3 F (36.8 C) 99 F (37.2 C) 98.4 F (36.9 C)  TempSrc:  Oral Oral Oral  SpO2:  96% 95% 94%  Weight: 74.1 kg (163 lb 6.4 oz)   75 kg (165 lb 5.5 oz)  Height:        Last BM  Date: 08/13/17  Intake/Output   Yesterday:  02/27 0701 - 02/28 0700 In: 513 [P.O.:510; I.V.:3] Out: 220 [Drains:20; Stool:200] This shift:  No intake/output data recorded.  Bowel function:  Flatus: YES  BM:  YES - thicker  Drain: Serosanguinous   Physical Exam:  General: Pt awake/alert/oriented x4 in no acute distress.  More alert.  Not toxic Eyes: PERRL, normal EOM.  Sclera clear.  No icterus Neuro: CN II-XII intact w/o focal sensory/motor deficits. Lymph: No head/neck/groin lymphadenopathy Psych:  No delerium/psychosis/paranoia HENT: Normocephalic, Mucus membranes moist.  No thrush Neck: Supple, No tracheal deviation Chest: No chest wall pain w good excursion CV:  Pulses intact.  Regular rhythm MS: Normal AROM mjr joints.  No obvious deformity  Abdomen: Soft.  Mildy distended.  Mildly tender at incisions only.  RLQ ileostomy clean.  Midline incision c/d/i.  LLQ old colostomy wound clean.  No evidence of peritonitis.  No incarcerated hernias.  Ext:  No deformity.  No mjr edema.  No cyanosis Skin: No petechiae / purpura  Results:   Labs: Results for orders placed or performed during the hospital encounter of 08/08/17 (from the past 48 hour(s))  Glucose, capillary     Status: None   Collection Time: 08/12/17  7:37 AM  Result Value Ref Range   Glucose-Capillary 78 65 - 99 mg/dL  Glucose, capillary     Status: Abnormal   Collection Time: 08/12/17 11:34 AM  Result Value Ref Range   Glucose-Capillary 125 (H) 65 - 99 mg/dL  Glucose, capillary     Status: None   Collection Time: 08/12/17  5:43 PM  Result Value Ref Range   Glucose-Capillary 75 65 - 99 mg/dL  Glucose, capillary     Status: Abnormal   Collection Time: 08/12/17  8:09 PM  Result Value Ref Range   Glucose-Capillary 113 (H) 65 - 99 mg/dL  Glucose, capillary     Status: None   Collection Time: 08/13/17 12:12 AM  Result Value Ref Range   Glucose-Capillary 97 65 - 99 mg/dL  Glucose, capillary     Status:  None   Collection Time: 08/13/17  4:23 AM  Result Value Ref Range   Glucose-Capillary 83 65 - 99 mg/dL  CBC     Status: Abnormal   Collection Time: 08/13/17  5:34 AM  Result Value Ref Range   WBC 8.7 4.0 - 10.5 K/uL   RBC 3.03 (L) 3.87 - 5.11 MIL/uL   Hemoglobin 9.0 (L) 12.0 - 15.0 g/dL   HCT 27.0 (L) 36.0 - 46.0 %   MCV 89.1 78.0 - 100.0 fL   MCH 29.7 26.0 - 34.0 pg   MCHC 33.3 30.0 - 36.0 g/dL   RDW 13.2 11.5 - 15.5 %   Platelets 239 150 - 400 K/uL    Comment: Performed at [facility], [place] [address] [place], [place] [address]  Basic metabolic panel     Status: Abnormal   Collection Time: 08/13/17  5:34 AM  Result Value Ref Range   Sodium 139 135 - 145 mmol/L   Potassium 3.5 3.5 - 5.1 mmol/L   Chloride 105 101 - 111 mmol/L   CO2 26  22 - 32 mmol/L   Glucose, Bld 91 65 - 99 mg/dL   BUN 11 6 - 20 mg/dL   Creatinine, Ser 0.53 0.44 - 1.00 mg/dL   Calcium 8.2 (L) 8.9 - 10.3 mg/dL   GFR calc non Af Amer >60 >60 mL/min   GFR calc Af Amer >60 >60 mL/min    Comment: (NOTE) The eGFR has been calculated using the CKD EPI equation. This calculation has not been validated in all clinical situations. eGFR's persistently <60 mL/min signify possible Chronic Kidney Disease.    Anion gap 8 5 - 15    Comment: Performed at [facility], [address] [place], [place] [address]  Magnesium     Status: None   Collection Time: 08/13/17  5:34 AM  Result Value Ref Range   Magnesium 1.9 1.7 - 2.4 mg/dL    Comment: Performed at [facility], [address] [place], [place] [address]  Phosphorus     Status: None   Collection Time: 08/13/17  5:34 AM  Result Value Ref Range   Phosphorus 3.1 2.5 - 4.6 mg/dL    Comment: Performed at [facility], [address] [place], [place] [address]  Glucose, capillary     Status: None   Collection Time: 08/13/17  7:42 AM  Result Value Ref Range   Glucose-Capillary 84 65 - 99  mg/dL  Glucose, capillary     Status: None   Collection Time: 08/13/17 12:00 PM  Result Value Ref Range   Glucose-Capillary 90 65 - 99 mg/dL  Glucose, capillary     Status: None   Collection Time: 08/13/17  3:58 PM  Result Value Ref Range   Glucose-Capillary 92 65 - 99 mg/dL  Glucose, capillary     Status: Abnormal   Collection Time: 08/13/17  8:19 PM  Result Value Ref Range   Glucose-Capillary 114 (H) 65 - 99 mg/dL  Glucose, capillary     Status: None   Collection Time: 08/13/17 11:50 PM  Result Value Ref Range   Glucose-Capillary 90 65 - 99 mg/dL  Glucose, capillary     Status: None   Collection Time: 08/14/17  4:25 AM  Result Value Ref Range   Glucose-Capillary 73 65 - 99 mg/dL    Imaging / Studies: No results found.  Medications / Allergies: per chart  Antibiotics: Anti-infectives (From admission, onward)   Start     Dose/Rate Route Frequency Ordered Stop   08/08/17 1346  clindamycin (CLEOCIN) 900 MG/50ML IVPB    Comments:  [place], Cheryl   : cabinet override      08/08/17 1346 08/08/17 1352   08/08/17 0600  clindamycin (CLEOCIN) IVPB 900 mg     900 mg 100 mL/hr over 30 Minutes Intravenous 30 min pre-op 08/07/17 1325 08/08/17 1352   08/08/17 0600  gentamicin (GARAMYCIN) 360 mg in dextrose 5 % 100 mL IVPB     360 mg 218 mL/hr over 30 Minutes Intravenous 30 min pre-op 08/07/17 1325 08/08/17 0815   08/08/17 0552  neomycin (MYCIFRADIN) tablet 1,000 mg  Status:  Discontinued     1,000 mg Oral 3 times per day 08/08/17 0552 08/08/17 1953   08/08/17 0552  metroNIDAZOLE (FLAGYL) tablet 1,000 mg  Status:  Discontinued     1,000 mg Oral 3 times per day 08/08/17 0552 08/08/17 1953        Note: Portions of this report may have been transcribed using voice recognition software. Every effort was made   to ensure accuracy; however, inadvertent computerized transcription errors may be present.   Any transcriptional errors that result from this process are  unintentional.     Wanda Baker, M.D., F.A.C.S. Gastrointestinal and Minimally Invasive Surgery [facility] [address] [place], [place] [address] [phone] Main / Paging   08/14/2017

## 2017-08-14 NOTE — Progress Notes (Signed)
[facility] Consultation Progress Note  Patient: Wanda Baker PXT:[id number]   PCP: Tonette Bihari, MD DOB: [birth year]   DOA: 08/08/2017   DOS: 08/14/2017   Primary service: Michael Boston, MD  Assessment and Plan: Sinus tachycardia Tachycardia from dehydration, she received IV fluids, he is also started on Lopressor.  Rate as much improved. She does have suppressed TSH but free T4 unremarkable. CTA chest no PE CT abdomen pelvis no acute findings  Acute postop anemia Baseline hemoglobin 14.5, hgb stable, she is on oral iron supplement. Hold off on blood transfusion unless symptomatic. Recheck with PCP in 1-2 weeks  hypertension and diabetes resolved with diet and exercise. has been off medications for months  Prior to the surgery. Started on Lopressor.  Due to tachycardia and hypertension.  She does not appear in significant pain.  Diet: per General surgery  DVT Prophylaxis: subcutaneous Heparin  Disposition: We will sign off now.  Please call with questions.  Recommendation on discharge: Continue lopressor Med rec performed for discharge for medical issues.   Other Consultants: none  PROCEDURE: 1. Exploratory laparotomy 2. Lysis of adhesions x60 minutes 3. Takedown of splenic flexure 4. Takedown of colostomy with stapled colorectal anastomosis 5. Small bowel resection with enteroenterostomy 6. Flexible sigmoidoscopy 7. Creation of ileostomy   Antibiotics: Anti-infectives (From admission, onward)   Start     Dose/Rate Route Frequency Ordered Stop   08/08/17 1346  clindamycin (CLEOCIN) 900 MG/50ML IVPB    Comments:  Whitlow, Cheryl   : cabinet override      08/08/17 1346 08/08/17 1352   08/08/17 0600  clindamycin (CLEOCIN) IVPB 900 mg     900 mg 100 mL/hr over 30 Minutes Intravenous 30 min pre-op 08/07/17 1325 08/08/17 1352   08/08/17 0600  gentamicin (GARAMYCIN) 360 mg in dextrose 5 % 100 mL IVPB     360 mg 218 mL/hr over 30 Minutes Intravenous 30  min pre-op 08/07/17 1325 08/08/17 0815   08/08/17 0552  neomycin (MYCIFRADIN) tablet 1,000 mg  Status:  Discontinued     1,000 mg Oral 3 times per day 08/08/17 0552 08/08/17 1953   08/08/17 0552  metroNIDAZOLE (FLAGYL) tablet 1,000 mg  Status:  Discontinued     1,000 mg Oral 3 times per day 08/08/17 0552 08/08/17 1953      Objective: Physical Exam: Vitals:   08/13/17 2034 08/14/17 0429 08/14/17 0742 08/14/17 0914  BP: (!) 144/75 125/72 136/72 132/84  Pulse: (!) 115 97 (!) 106 (!) 104  Resp: 18 18 18    Temp: 99 F (37.2 C) 98.4 F (36.9 C) 99.1 F (37.3 C)   TempSrc: Oral Oral Oral   SpO2: 95% 94% 96%   Weight:  75 kg (165 lb 5.5 oz)    Height:        Intake/Output Summary (Last 24 hours) at 08/14/2017 1234 Last data filed at 08/14/2017 1114 Gross per 24 hour  Intake 873 ml  Output 370 ml  Net 503 ml   Filed Weights   08/12/17 0411 08/13/17 0424 08/14/17 0429  Weight: 75.6 kg (166 lb 11.2 oz) 74.1 kg (163 lb 6.4 oz) 75 kg (165 lb 5.5 oz)   Data Reviewed: CBC: Recent Labs  Lab 08/09/17 1526 08/10/17 0517 08/11/17 0432 08/12/17 0537 08/13/17 0534  WBC 11.7* 10.6* 12.2* 8.7 8.7  HGB 11.1* 9.1* 8.9* 8.5* 9.0*  HCT 33.2* 27.4* 27.0* 25.6* 27.0*  MCV 88.3 89.8 87.4 89.2 89.1  PLT 235 191 209 226 239  Basic Metabolic Panel: Recent Labs  Lab 08/09/17 0319 08/09/17 1526 08/10/17 0517 08/11/17 0432 08/12/17 0537 08/13/17 0534  NA 136 135 139 137 139 139  K 4.1 3.9 4.0 4.1 3.8 3.5  CL 102 102 103 103 108 105  CO2 22 22 28 22  20* 26  GLUCOSE 151* 122* 116* 109* 88 91  BUN 10 15 13 14 17 11   CREATININE 0.85 0.84 0.69 0.69 0.55 0.53  CALCIUM 7.9* 8.0* 8.2* 8.5* 8.4* 8.2*  MG 1.2* 2.6* 2.3 2.0 1.9 1.9  PHOS 4.0  --  2.5 2.7 2.6 3.1   Liver Function Tests: Recent Labs  Lab 08/09/17 1526  AST 27  ALT 19  ALKPHOS 41  BILITOT 1.3*  PROT 5.2*  ALBUMIN 2.8*   No results for input(s): LIPASE, AMYLASE in the last 168 hours. No results for input(s): AMMONIA in  the last 168 hours. Cardiac Enzymes: No results for input(s): CKTOTAL, CKMB, CKMBINDEX, TROPONINI in the last 168 hours. BNP (last 3 results) No results for input(s): BNP in the last 8760 hours. CBG: Recent Labs  Lab 08/13/17 2019 08/13/17 2350 08/14/17 0425 08/14/17 0730 08/14/17 1203  GLUCAP 114* 90 73 92 109*   No results found for this or any previous visit (from the past 240 hour(s)).  Studies: No results found.   Scheduled Meds: . acetaminophen  1,000 mg Oral TID  . enoxaparin (LOVENOX) injection  40 mg Subcutaneous Q24H  . ferrous sulfate  325 mg Oral BID WC  . gabapentin  400 mg Oral TID  . insulin aspart  0-15 Units Subcutaneous Q4H  . lip balm  1 application Topical BID  . loratadine  10 mg Oral Daily  . methocarbamol  750 mg Oral TID  . metoCLOPramide  5 mg Oral TID AC  . metoprolol tartrate  50 mg Oral BID  . pantoprazole  40 mg Oral Q1200  . psyllium  1 packet Oral BID  . saccharomyces boulardii  250 mg Oral BID  . sodium chloride flush  3 mL Intravenous Q12H   Continuous Infusions: . sodium chloride    . lactated ringers    . methocarbamol (ROBAXIN)  IV    . sodium chloride     PRN Meds: sodium chloride, alum & mag hydroxide-simeth, diphenhydrAMINE **OR** diphenhydrAMINE, guaiFENesin-dextromethorphan, hydrocortisone, hydrocortisone cream, HYDROmorphone (DILAUDID) injection, iopamidol, lactated ringers, loperamide, magic mouthwash, menthol-cetylpyridinium, methocarbamol (ROBAXIN)  IV, metoprolol tartrate, ondansetron **OR** ondansetron (ZOFRAN) IV, phenol, promethazine, sodium chloride, sodium chloride flush, traMADol  Time spent: 62minutes  Author: Berle Mull, MD [facility] Pager: [phone] 08/14/2017 12:34 PM  If 7PM-7AM, please contact night-coverage at [email or web address], password [facility]

## 2017-08-14 NOTE — Progress Notes (Signed)
Physical Therapy Treatment Patient Details Name: Wanda Baker MRN: [id number] DOB: 18-May-1961 Today's Date: 08/14/2017    History of Present Illness Wanda Baker is a 57 y.o. female with medical history significant of adenocarcinoma of the colon status post exploratory lap, small bowel resection, adhesiolysis, Hartmann's takedown with stapled low colorectal anastomosis on 08/08/2017. PMH positive for HTN, DM2, HLD, GERD.    PT Comments    Pt progressing well; amb with nursing staff earlier to day; mildly unsteady with out UE support, encouraged her to use RW at home for support as needed   Follow Up Recommendations  Supervision for mobility/OOB;No PT follow up     Equipment Recommendations  None recommended by PT(has RW)    Recommendations for Other Services       Precautions / Restrictions Precautions Precautions: Fall Restrictions Weight Bearing Restrictions: No    Mobility  Bed Mobility               General bed mobility comments: pt in chair  Transfers Overall transfer level: Independent Equipment used: None Transfers: Sit to/from Stand Sit to Stand: Supervision         General transfer comment: for safety  Ambulation/Gait Ambulation/Gait assistance: Supervision Ambulation Distance (Feet): 240 Feet Assistive device: None Gait Pattern/deviations: Step-through pattern;Decreased stride length;Drifts right/left Gait velocity: decr   General Gait Details: intermittent assist for mild unsteadiness without UE support, pt wanted to push dinamap for support last 40' of distance however declined to use RW; HR max 93, SpO2=94% on RA   Stairs            Wheelchair Mobility    Modified Rankin (Stroke Patients Only)       Balance     Sitting balance-Leahy Scale: Good     Standing balance support: No upper extremity supported;During functional activity Standing balance-Leahy Scale: Fair Standing balance comment: amb without UE support alhtough  cannot tolerate perturbations                            Cognition Arousal/Alertness: Awake/alert Behavior During Therapy: WFL for tasks assessed/performed Overall Cognitive Status: Within Functional Limits for tasks assessed                                        Exercises      General Comments        Pertinent Vitals/Pain Pain Assessment: 0-10 Pain Score: 1  Pain Location: abdomen Pain Descriptors / Indicators: Tender Pain Intervention(s): Monitored during session;Premedicated before session    Home Living                      Prior Function            PT Goals (current goals can now be found in the care plan section) Acute Rehab PT Goals Patient Stated Goal: To return to independent PT Goal Formulation: With patient Time For Goal Achievement: 08/19/17 Potential to Achieve Goals: Good Progress towards PT goals: Progressing toward goals    Frequency    Min 3X/week      PT Plan Current plan remains appropriate    Co-evaluation              AM-PAC PT "6 Clicks" Daily Activity  Outcome Measure  Difficulty turning over in bed (including adjusting bedclothes, sheets and blankets)?: None Difficulty  moving from lying on back to sitting on the side of the bed? : None Difficulty sitting down on and standing up from a chair with arms (e.g., wheelchair, bedside commode, etc,.)?: None Help needed moving to and from a bed to chair (including a wheelchair)?: A Little Help needed walking in hospital room?: A Little Help needed climbing 3-5 steps with a railing? : A Little 6 Click Score: 21    End of Session Equipment Utilized During Treatment: Gait belt Activity Tolerance: Patient tolerated treatment well Patient left: with call bell/phone within reach;in chair   PT Visit Diagnosis: Other abnormalities of gait and mobility (R26.89)     Time: 3254-9826 PT Time Calculation (min) (ACUTE ONLY): 18 min  Charges:  $Gait  Training: 8-22 mins                    G CodesKenyon Ana, PT Pager: [phone] 08/14/2017    Kenyon Ana 08/14/2017, 12:49 PM

## 2017-08-15 LAB — CREATININE, SERUM
CREATININE: 0.54 mg/dL (ref 0.44–1.00)
GFR calc Af Amer: >60 mL/min (ref >60)
GFR calc non Af Amer: >60 mL/min (ref >60)

## 2017-08-15 LAB — GLUCOSE, CAPILLARY
GLUCOSE-CAPILLARY: 104 mg/dL — AB (ref 65–99)
Glucose-Capillary: 85 mg/dL (ref 65–99)
Glucose-Capillary: 125 mg/dL — ABNORMAL HIGH (ref 65–99)

## 2017-08-15 LAB — POTASSIUM: POTASSIUM: 3.2 mmol/L — AB (ref 3.5–5.1)

## 2017-08-15 LAB — HEMOGLOBIN: Hemoglobin: 8.5 g/dL — ABNORMAL LOW (ref 12.0–15.0)

## 2017-08-15 MED ORDER — METOPROLOL TARTRATE 50 MG PO TABS: 50.0000 mg | ORAL_TABLET | Freq: Every day | ORAL | 0 refills | Status: DC

## 2017-08-15 MED ORDER — POTASSIUM CHLORIDE CRYS ER 20 MEQ PO TBCR
40.0000 meq | EXTENDED_RELEASE_TABLET | Freq: Once | ORAL | Status: AC
  Administered 2017-08-15: 40 meq via ORAL
  Filled 2017-08-15: qty 2

## 2017-08-15 MED ORDER — TRAMADOL HCL 50 MG PO TABS: 50.0000 mg | ORAL_TABLET | Freq: Four times a day (QID) | ORAL | 0 refills | Status: DC | PRN

## 2017-08-15 NOTE — Discharge Instructions (Signed)
Ostomy Support Information ° °You’ve heard that people get along just fine with only one of their eyes, or one of their lungs, or one of their kidneys. But you also know that you have only one intestine and only one bladder, and that leaves you feeling awfully empty, both physically and emotionally: You think no other people go around without part of their intestine with the ends of their intestines sticking out through their abdominal walls.  ° °YOU ARE NOT ALONE.  There are nearly three quarters of a million people in the [place] who have an ostomy; people who have had surgery to remove all or part of their colons or bladders.  ° °There is even a national association, the [employer] with over 350 local affiliated support groups that are organized by volunteers who provide peer support and counseling. [employer] has a toll free telephone num-ber, 800-826-0826 and an educational, interactive website, www.ostomy.org  ° °An ostomy is an opening in the belly (abdominal wall) made by surgery. Ostomates are people who have had this procedure. The opening (stoma) allows the kidney or bowel to discharge waste. An external pouch covers the stoma to collect waste. Pouches are are a simple bag and are odor free. Different companies have disposable or reusable pouches to fit one's lifestyle. An ostomy can either be temporary or permanent.  ° °THERE ARE THREE MAIN TYPES OF OSTOMIES °· Colostomy. A colostomy is a surgically created opening in the large intestine (colon). °· Ileostomy. An ileostomy is a surgically created opening in the small intestine. °· Urostomy. A urostomy is a surgically created opening to divert urine away from the bladder. ° °OSTOMY Care ° °The following guidelines will make care of your colostomy easier. Keep this information close by for quick reference. ° °Helpful DIET hints °Eat a well-balanced diet including vegetables and fresh fruits. Eat on a regular schedule. Drink at least 6 to 8  glasses of fluids daily. °Eat slowly in a relaxed atmosphere. Chew your food thoroughly. Avoid chewing gum, smoking, and drinking from a straw. This will help decrease the amount of air you swallow, which may help reduce gas. °Eating yogurt or drinking buttermilk may help reduce gas. ° °To control gas at night, do not eat after 8 p.m. This will give your bowel time to quiet down before you go to bed. ° °If gas is a problem, you can purchase Beano. Sprinkle Beano on the first bite of food before eating to reduce gas. It has no flavor and should not change the taste of your food. You can buy Beano over the counter at your local drugstore. ° °Foods like fish, onions, garlic, broccoli, asparagus, and cabbage produce odor. Although your pouch is odor-proof, if you eat these foods you may notice a stronger odor when emptying your pouch. If this is a concern, you may want to limit these foods in your diet. ° °If you have an ileostomy, you will have chronic diarrhea & need to drink more liquids to avoid getting dehydrated.  Consider antidiarrheal medicine like imodium (loperamide) or Lomotil to help slow down bowel movements / diarrhea into your ileostomy bag. ° °GETTING TO GOOD BOWEL HEALTH WITH AN ILEOSTOMY °. °Irregular bowel habits such as constipation and diarrhea can lead to many problems over time.  The goal: 3-6 small BOWEL MOVEMENTS A DAY!  To have soft, regular bowel movements:  °• Drink plenty of fluids, consider 4-6 tall glasses of water a day.   ° °Controlling   diarrheA ° °o Switch to liquids and simpler foods for a few days to avoid stressing your intestines further. °o Avoid dairy products (especially milk & ice cream) for a short time.  The intestines often can lose the ability to digest lactose when stressed. °o Avoid foods that cause gassiness or bloating.  Typical foods include beans and other legumes, cabbage, broccoli, and dairy foods.  Every person has some sensitivity to other foods, so listen to our  body and avoid those foods that trigger problems for you. °o Adding fiber (Citrucel, Metamucil, psyllium, Miralax) gradually can help thicken stools by absorbing excess fluid and retrain the intestines to act more normally.  Slowly increase the dose over a few weeks.  Too much fiber too soon can backfire and cause cramping & bloating. °o Probiotics (such as active yogurt, Align, etc) may help repopulate the intestines and colon with normal bacteria and calm down a sensitive digestive tract.  Most studies show it to be of mild help, though, and such products can be costly. °o Medicines: °- Bismuth subsalicylate (ex. Kayopectate, Pepto Bismol) every 30 minutes for up to 6 doses can help control diarrhea.  Avoid if pregnant. °- Loperamide (Immodium) can slow down diarrhea.  Start with two tablets (4mg total) first and then try one tablet every 6 hours.  Avoid if you are having fevers or severe pain.  If you are not better or start feeling worse, stop all medicines and call your doctor for advice °o Call your doctor if you are getting worse or not better.  Sometimes further testing (cultures, endoscopy, X-ray studies, bloodwork, etc) may be needed to help diagnose and treat the cause of the diarrhea. ° °TROUBLESHOOTING IRREGULAR BOWELS °1) Avoid extremes of bowel movements (no bad constipation/diarrhea) °2) Miralax 17gm mixed in 8oz. water or juice-daily. May use twice a day as needed  °3) Gas-x,Phazyme, etc. as needed for gas & bloating.  °4) Soft,bland diet. No spicy,greasy,fried foods.  °5) Prilosec (omeprazole) over-the-counter as needed  °6) May hold gluten/wheat products from diet to see if symptoms improve.  °7)  May try probiotics (Align, Activa, etc) to help calm the bowels down °7) If symptoms become worse call back immediately. ° ° °Applying the pouching system °To apply your pouch, follow these steps: ° °Place all your equipment close at hand before removing your pouch. ° °Wash your hands. ° °Stand or sit in  front of a mirror. Use the position that works best for you. Remember that you must keep the skin around the stoma wrinkle-free for a good seal. ° °Gently remove the used pouch (1-piece system) or the pouch and old wafer (2-piece system). Empty the pouch into the toilet. Save the closure clip to use again. ° °Wash the stoma itself and the skin around the stoma. Your stoma may bleed a little when being washed. This is normal. Rinse and pat dry. You may use a wash cloth or soft paper towels (like Bounty), mild soap (like Dial, Safeguard, or Ivory), and water. Avoid soaps that contain perfumes or lotions. ° °For a new pouch (1-piece system) or a new wafer (2-piece system), measure your stoma using the stoma guide in each box of supplies. ° °Trace the shape of your stoma onto the back of the new pouch or the back of the new wafer. Cut out the opening. Remove the paper backing and set it aside. ° °Optional: Apply a skin barrier powder to surrounding skin if it is irritated (bare or weeping),   and dust off the excess. °Optional: Apply a skin-prep wipe (such as Skin Prep or All-Kare) to the skin around the stoma, and let it dry. Do not apply this solution if the skin is irritated (red, tender, or broken) or if you have shaved around the stoma. °Optional: Apply a skin barrier paste (such as Stomahesive, Coloplast, or Premium) around the opening cut in the back of the pouch or wafer. Allow it to dry for 30 to 60 seconds. ° °Hold the pouch (1-piece system) or wafer (2-piece system) with the sticky side toward your body. Make sure the skin around the stoma is wrinkle-free. Center the opening on the stoma, then press firmly to your abdomen (Fig. 4). Look in the mirror to check if you are placing the pouch, or wafer, in the right position. For a 2-piece system, snap the pouch onto the wafer. Make sure it snaps into place securely. ° °Place your hand over the stoma and the pouch or wafer for about 30 seconds. The heat from your  hand can help the pouch or wafer stick to your skin. ° °Add deodorant (such as Super Banish or Nullo) to your pouch. Other options include food extracts such as vanilla oil and peppermint extract. Add about 10 drops of the deodorant to the pouch. Then apply the closure clamp. Note: Do not use toxic ° chemicals or commercial cleaning agents in your pouch. These substances may harm the stoma. ° °Optional: For extra seal, apply tape to all 4 sides around the pouch or wafer, as if you were framing a picture. You may use any brand of medical adhesive tape. °Change your pouch every 5 to 7 days. Change it immediately if a leak occurs.  Wash your hands afterwards. ° °If you are wearing a 2-piece system, you may use 2 new pouches per week and alternate them. Rinse the pouch with mild soap and warm water and hang it to dry for the next day. Apply the fresh pouch. Alternate the 2 pouches like this for a week. After a week, change the wafer and begin with 2 new pouches. Place the old pouches in a plastic bag, and put them in the trash. ° ° ° °Tips for colostomy care ° °Applying Your Pouch °You may stand or sit to apply your pouch. ° °Keep the skin where you apply the pouch wrinkle-free. If the skin around the pouch is wrinkled, the seal may break when your skin stretches. ° °If hair grows close to your stoma, you may trim off the hair with scissors, an electric razor, or a safety razor. ° °Always have a mirror nearby so you can get a better view of your stoma. ° °When you apply a new pouch, write the date on the adhesive tape. This will remind you of when you last changed your pouch. ° °Changing Your Pouch °The best time to change your pouch is in the morning, before eating or drinking anything. Your stoma can function at any time, but it will function more after eating or drinking. ° °Emptying Your Pouch °Empty your pouch when it is one-third full (of urine, stool, and/or gas). If you wait until your pouch is fuller than this,  it will be more difficult to empty and more noticeable. °When you empty your pouch, either put toilet paper in the toilet bowl first, or flush the toilet while you empty the pouch. This will reduce splashing. You can empty the pouch between your legs or to one side while sitting,   or while standing or stooping. If you have a 2-piece system, you can snap off the pouch to empty it. Remember that your stoma may function during this time. °If you wish to rinse your pouch after you empty it, a turkey baster can be helpful. When using a baster, squirt water up into the pouch through the opening at the °bottom. With a 2-piece system, you can snap off the pouch to rinse it. After rinsing  your pouch, empty it into the toilet. °When rinsing your pouch at home, put a few granules of Dreft soap in the rinse water. This helps lubricate and freshen your pouch. °The inside of your pouch can be sprayed with non-stick cooking oil (Pam spray). This may help reduce stool sticking to the inside of the pouch. ° °Bathing °You may shower or bathe with your pouch on or off. Remember that your stoma may function during this time. ° °The materials you use to wash your stoma and the skin around it should be clean, but they do not need to be sterile. ° °Wearing Your Pouch °During hot weather, or if you perspire a lot in general, wear a cover over your pouch. This may prevent a rash on your skin under the pouch. Pouch covers are sold at ostomy supply stores. °Wear the pouch inside your underwear for better support. °Watch your weight. Any gain or loss of 10 to 15 pounds or more can change the way your pouch fits. ° °Going Away From Home °A collapsible cup (like those that come in travel kits) or a soft plastic squirt bottle with a pull-up top (like a travel bottle for shampoo) can be used for rinsing your pouch when you are away from home. Tilt the opening of the pouch at an upward angle when using a cup to rinse. ° °Carry wet wipes or extra  tissues to use in public bathrooms. ° °Carry an extra pouching system with you at all times. ° °Never keep ostomy supplies in the glove compartment of your car. Extreme heat or cold can damage the skin barriers and adhesive wafers on the pouch. ° °When you travel, carry your ostomy supplies with you at all times. Keep them within easy reach. Do not pack ostomy supplies in baggage that will be checked or otherwise separated from you, because your baggage might be lost. If you’re traveling out of the country, it is helpful to have a letter stating that you are carrying ostomy supplies as a medical necessity. ° °If you need ostomy supplies while traveling, look in the yellow pages of the telephone book under “Surgical Supplies.” Or call the local ostomy organization to find out where supplies are available. ° °Do not let your ostomy supplies get low. Always order new pouches before you use the last one. ° °Reducing Odor °Limit foods such as broccoli, cabbage, onions, fish, and garlic in your diet to help reduce odor. °Each time you empty your pouch, carefully clean the opening of the pouch, both inside and outside, with toilet paper. °Rinse your pouch 1 or 2 times daily after you empty it (see directions for emptying your pouch and going away from home). °Add deodorant (such as Super Banish or Nullo) to your pouch. °Use air deodorizers in your bathroom. °Do not add aspirin to your pouch. Even though aspirin can help prevent odor, it could cause ulcers on your stoma. ° °When to call the doctor °Call the doctor if you have any of the following symptoms: °Purple, black,   or white stoma Severe cramps lasting more than 6 hours Severe watery discharge from the stoma lasting more than 6 hours No output from the colostomy for 3 days Excessive bleeding from your stoma Swelling of your stoma to more than 1/2-inch larger than usual Pulling inward of your stoma below skin level Severe skin irritation or deep ulcers Bulging  or other changes in your abdomen  When to call your ostomy nurse Call your ostomy/enterostomal therapy (ET) nurse if any of the following occurs: Frequent leaking of your pouching system Change in size or appearance of your stoma, causing discomfort or problems with your pouch Skin rash or rawness Weight gain or loss that causes problems with your pouch      FREQUENTLY ASKED QUESTIONS   Why havent you met any of these folks who have an ostomy?  Well, maybe you have! You just did not recognize them because an ostomy doesn't show. It can be kept secret if you wish. Why, maybe some of your best friends, office associates or neighbors have an ostomy ... you never can tell.   People facing ostomy surgery have many quality-of-life questions like:  Will you bulge? Smell? Make noises? Will you feel waste leaving your body? Will you be a captive of the toilet? Will you starve? Be a social outcast? Get/stay married? Have babies? Easily bathe, go swimming, bend over?  OK, lets look at what you can expect:   Will you bulge?  Remember, without part of the intestine or bladder, and its contents, you should have a flatter tummy than before. You can expect to wear, with little exception, what you wore before surgery ... and this in-cludes tight clothing and bathing suits.   Will you smell?  Today, thanks to modern odor proof pouching systems, you can walk into an ostomy support group meeting and not smell anything that is foul or offensive. And, for those with an ileostomy or colostomy who are concerned about odor when emptying their pouch, there are in-pouch deodorants that can be used to eliminate any waste odors that may exist.   Will you make noises?  Everyone produces gas, especially if they are an air-swallower. But intestinal sounds that occur from time to time are no differ-ent than a gurgling tummy, and quite often your clothing will muffle any sounds.   Will you feel the waste discharges?   For those with a colostomy or ileostomy there might be a slight pressure when waste leaves your body, but understand that the intestines have no nerve endings, so there will be no unpleasant sensations. Those with a urostomy will probably be unaware of any kidney drainage.   Will you be a captive of the toilet?  Immediately post-op you will spend more time in the bathroom than you will after your body recovers from surgery. Every person is different, but on average those with an ileostomy or urostomy may empty their pouches 4 to 6 times a day; a little  less if you have a colostomy. The average wear time between pouch system changes is 3 to 5 days and the changing process should take less than 30 minutes.   Will I need to be on a special diet? Most people return to their normal diet when they have recovered from surgery. Be sure to chew your food well, eat a well-balanced diet and drink plenty of fluids. If you experience problems with a certain food, wait a couple of weeks and try it again.  Will there be  odor and noises? Pouching systems are designed to be odor-proof or odor-resistant. There are deodorants that can be used in the pouch. Medications are also available to help reduce odor. Limit gas-producing foods and carbonated beverages. You will experience less gas and fewer noises as you heal from surgery.  How much time will it take to care for my ostomy? At first, you may spend a lot of time learning about your ostomy and how to take care of it. As you become more comfortable and skilled at changing the pouching system, it will take very little time to care for it.   Will I be able to return to work? People with ostomies can perform most jobs. As soon as you have healed from surgery, you should be able to return to work. Heavy lifting (more than 10 pounds) may be discouraged.   What about intimacy? Sexual relationships and intimacy are important and fulfilling aspects of your life. They  should continue after ostomy surgery. Intimacy-related concerns should be discussed openly between you and your partner.   Can I wear regular clothing? You do not need to wear special clothing. Ostomy pouches are fairly flat and barely noticeable. Elastic undergarments will not hurt the stoma or prevent the ostomy from functioning.   Can I participate in sports? An ostomy should not limit your involvement in sports. Many people with ostomies are runners, skiers, swimmers or participate in other active lifestyles. Talk with your caregiver first before doing heavy physical activity.  Will you starve?  Not if you follow doctors orders at each stage of your post-op adjustment. There is no such thing as an ostomy diet. Some people with an ostomy will be able to eat and tolerate anything; others may find diffi-culty with some foods. Each person is an individual and must determine, by trial, what is best for them. A good practice for all is to drink plenty of water.   Will you be a social outcast?  Have you met anyone who has an ostomy and is a social outcast? Why should you be the first? Only your attitude and self image will effect how you are treated. No confi-dent person is an Occupational psychologist.     PROFESSIONAL HELP  Resources are available if you need help or have questions about your ostomy.    Specially trained nurses called Wound, Ostomy Continence Nurses (WOCN) are available for consultation in most major medical centers.   Consider getting an ostomy consult at one of the outpatient ostomy clinics.  [facility] one in [place] at this time   The [employer] ([employer]) is a group made up of many local chapters throughout the [place]. These local groups hold meetings and provide support to prospective and existing ostomates. They sponsor educational events and have qualified visitors to make personal or telephone visits. Contact the [employer] for the chapter nearest you and for other  educational publications.   More detailed information can be found in Colostomy Guide, a publication of the [employer] ([employer]OA). Contact [employer] at [phone] or visit their web site at [email or web address] The website contains links to other sites, suppliers and resources.   Tree surgeon Start Services:  Start at the website to enlist for support.  Your Wound Ostomy (WOCN) nurse may have started this process. [email or web address]/securestart  Secure Start services are designed to support people as they live their lives with an ostomy or neurogenic bladder. Enrolling is easy and at no cost to the patient. We realize that  each person's needs and life journey are different. Through Secure Start services, we want to help people live their life, their way.  SURGERY: POST OP INSTRUCTIONS (Surgery for small bowel obstruction, colon resection, etc)   ######################################################################  EAT Gradually transition to a high fiber diet with a fiber supplement over the next few days after discharge  WALK Walk an hour a day.  Control your pain to do that.    CONTROL PAIN Control pain so that you can walk, sleep, tolerate sneezing/coughing, go up/down stairs.  HAVE A BOWEL MOVEMENT DAILY Keep your bowels regular to avoid problems.  OK to try a laxative to override constipation.  OK to use an antidairrheal to slow down diarrhea.  Call if not better after 2 tries  CALL IF YOU HAVE PROBLEMS/CONCERNS Call if you are still struggling despite following these instructions. Call if you have concerns not answered by these instructions  ######################################################################   DIET Follow a light diet the first few days at home.  Start with a bland diet such as soups, liquids, starchy foods, low fat foods, etc.  If you feel full, bloated, or constipated, stay on a ful liquid or pureed/blenderized diet for a few days  until you feel better and no longer constipated. Be sure to drink plenty of fluids every day to avoid getting dehydrated (feeling dizzy, not urinating, etc.). Gradually add a fiber supplement to your diet over the next week.  Gradually get back to a regular solid diet.  Avoid fast food or heavy meals the first week as you are more likely to get nauseated. It is expected for your digestive tract to need a few months to get back to normal.  It is common for your bowel movements and stools to be irregular.  You will have occasional bloating and cramping that should eventually fade away.  Until you are eating solid food normally, off all pain medications, and back to regular activities; your bowels will not be normal. Focus on eating a low-fat, high fiber diet the rest of your life (See Getting to [place], below).  CARE of your INCISION or WOUND It is good for closed incision and even open wounds to be washed every day.  Shower every day.  Short baths are fine.  Wash the incisions and wounds clean with soap & water.    If you have a closed incision(s), wash the incision with soap & water every day.  You may leave closed incisions open to air if it is dry.   You may cover the incision with clean gauze & replace it after your daily shower for comfort. If you have skin tapes (Steristrips) or skin glue (Dermabond) on your incision, leave them in place.  They will fall off on their own like a scab.  You may trim any edges that curl up with clean scissors.  If you have staples, set up an appointment for them to be removed in the office in 10 days after surgery.  If you have a drain, wash around the skin exit site with soap & water and place a new dressing of gauze or band aid around the skin every day.  Keep the drain site clean & dry.    If you have an open wound with packing, see wound care instructions.  In general, it is encouraged that you remove your dressing and packing, shower with soap & water,  and replace your dressing once a day.  Pack the wound with clean gauze  moistened with normal (0.9%) saline to keep the wound moist & uninfected.  Pressure on the dressing for 30 minutes will stop most wound bleeding.  Eventually your body will heal & pull the open wound closed over the next few months.  Raw open wounds will occasionally bleed or secrete yellow drainage until it heals closed.  Drain sites will drain a little until the drain is removed.  Even closed incisions can have mild bleeding or drainage the first few days until the skin edges scab over & seal.   If you have an open wound with a wound vac, see wound vac care instructions.     ACTIVITIES as tolerated Start light daily activities --- self-care, walking, climbing stairs-- beginning the day after surgery.  Gradually increase activities as tolerated.  Control your pain to be active.  Stop when you are tired.  Ideally, walk several times a day, eventually an hour a day.   Most people are back to most day-to-day activities in a few weeks.  It takes 4-8 weeks to get back to unrestricted, intense activity. If you can walk 30 minutes without difficulty, it is safe to try more intense activity such as jogging, treadmill, bicycling, low-impact aerobics, swimming, etc. Save the most intensive and strenuous activity for last (Usually 4-8 weeks after surgery) such as sit-ups, heavy lifting, contact sports, etc.  Refrain from any intense heavy lifting or straining until you are off narcotics for pain control.  You will have off days, but things should improve week-by-week. DO NOT PUSH THROUGH PAIN.  Let pain be your guide: If it hurts to do something, don't do it.  Pain is your body warning you to avoid that activity for another week until the pain goes down. You may drive when you are no longer taking narcotic prescription pain medication, you can comfortably wear a seatbelt, and you can safely make sudden turns/stops to protect yourself without  hesitating due to pain. You may have sexual intercourse when it is comfortable. If it hurts to do something, stop.  MEDICATIONS Take your usually prescribed home medications unless otherwise directed.   Blood thinners:  Usually you can restart any strong blood thinners after the second postoperative day.  It is OK to take aspirin right away.     If you are on strong blood thinners (warfarin/Coumadin, Plavix, Xerelto, Eliquis, Pradaxa, etc), discuss with your surgeon, medicine PCP, and/or cardiologist for instructions on when to restart the blood thinner & if blood monitoring is needed (PT/INR blood check, etc).     PAIN CONTROL Pain after surgery or related to activity is often due to strain/injury to muscle, tendon, nerves and/or incisions.  This pain is usually short-term and will improve in a few months.  To help speed the process of healing and to get back to regular activity more quickly, DO THE FOLLOWING THINGS TOGETHER: 1. Increase activity gradually.  DO NOT PUSH THROUGH PAIN 2. Use Ice and/or Heat 3. Try Gentle Massage and/or Stretching 4. Take over the counter pain medication 5. Take Narcotic prescription pain medication for more severe pain  Good pain control = faster recovery.  It is better to take more medicine to be more active than to stay in bed all day to avoid medications. 1.  Increase activity gradually Avoid heavy lifting at first, then increase to lifting as tolerated over the next 6 weeks. Do not push through the pain.  Listen to your body and avoid positions and maneuvers than reproduce the pain.  Wait a few days before trying something more intense Walking an hour a day is encouraged to help your body recover faster and more safely.  Start slowly and stop when getting sore.  If you can walk 30 minutes without stopping or pain, you can try more intense activity (running, jogging, aerobics, cycling, swimming, treadmill, sex, sports, weightlifting, etc.) Remember: If it  hurts to do it, then dont do it! 2. Use Ice and/or Heat You will have swelling and bruising around the incisions.  This will take several weeks to resolve. Ice packs or heating pads (6-8 times a day, 30-60 minutes at a time) will help sooth soreness & bruising. Some people prefer to use ice alone, heat alone, or alternate between ice & heat.  Experiment and see what works best for you.  Consider trying ice for the first few days to help decrease swelling and bruising; then, switch to heat to help relax sore spots and speed recovery. Shower every day.  Short baths are fine.  It feels good!  Keep the incisions and wounds clean with soap & water.   3. Try Gentle Massage and/or Stretching Massage at the area of pain many times a day Stop if you feel pain - do not overdo it 4. Take over the counter pain medication This helps the muscle and nerve tissues become less irritable and calm down faster Choose ONE of the following over-the-counter anti-inflammatory medications: Acetaminophen 542m tabs (Tylenol) 1-2 pills with every meal and just before bedtime (avoid if you have liver problems or if you have acetaminophen in you narcotic prescription) Naproxen 2256mtabs (ex. Aleve, Naprosyn) 1-2 pills twice a day (avoid if you have kidney, stomach, IBD, or bleeding problems) Ibuprofen 2004mabs (ex. Advil, Motrin) 3-4 pills with every meal and just before bedtime (avoid if you have kidney, stomach, IBD, or bleeding problems) Take with food/snack several times a day as directed for at least 2 weeks to help keep pain / soreness down & more manageable. 5. Take Narcotic prescription pain medication for more severe pain A prescription for strong pain control is often given to you upon discharge (for example: oxycodone/Percocet, hydrocodone/Norco/Vicodin, or tramadol/Ultram) Take your pain medication as prescribed. Be mindful that most narcotic prescriptions contain Tylenol (acetaminophen) as well - avoid taking  too much Tylenol. If you are having problems/concerns with the prescription medicine (does not control pain, nausea, vomiting, rash, itching, etc.), please call us [phone] see if we need to switch you to a different pain medicine that will work better for you and/or control your side effects better. If you need a refill on your pain medication, you must call the office before 4 pm and on weekdays only.  By federal law, prescriptions for narcotics cannot be called into a pharmacy.  They must be filled out on paper & picked up from our office by the patient or authorized caretaker.  Prescriptions cannot be filled after 4 pm nor on weekends.    WHEN TO CALL US [phone] uncontrolled or worsening pain  Fever over 101 F (38.5 C) Concerns with the incision: Worsening pain, redness, rash/hives, swelling, bleeding, or drainage Reactions / problems with new medications (itching, rash, hives, nausea, etc.) Nausea and/or vomiting Difficulty urinating Difficulty breathing Worsening fatigue, dizziness, lightheadedness, blurred vision Other concerns If you are not getting better after two weeks or are noticing you are getting worse, contact our office [phone] for further advice.  We may need to adjust your medications,  your medications, re-evaluate you in the office, send you to the emergency room, or see what other things we can do to help. °The clinic staff is available to answer your questions during regular business hours (8:30am-5pm).  Please don’t hesitate to call and ask to speak to one of our nurses for clinical concerns.    °A surgeon from [facility] is always on call at the hospitals 24 hours/day °If you have a medical emergency, go to the nearest emergency room or call 911. ° °FOLLOW UP in our office °One the day of your discharge from the hospital (or the next business weekday), please call [facility] to set up or confirm an appointment to see your surgeon in the  office for a follow-up appointment.  Usually it is 2-3 weeks after your surgery.   °If you have skin staples at your incision(s), let the office know so we can set up a time in the office for the nurse to remove them (usually around 10 days after surgery). °Make sure that you call for appointments the day of discharge (or the next business weekday) from the hospital to ensure a convenient appointment time. °IF YOU HAVE DISABILITY OR FAMILY LEAVE FORMS, BRING THEM TO THE OFFICE FOR PROCESSING.  DO NOT GIVE THEM TO YOUR DOCTOR. ° °[facility] °[address], [address], [place], [place]  [address] ? °[phone] - Main °[phone] - Toll Free,  [phone] - Fax °[email or web address] ° °GETTING TO GOOD BOWEL HEALTH. °It is expected for your digestive tract to need a few months to get back to normal.  It is common for your bowel movements and stools to be irregular.  You will have occasional bloating and cramping that should eventually fade away.  Until you are eating solid food normally, off all pain medications, and back to regular activities; your bowels will not be normal.   °Avoiding constipation °The goal: ONE SOFT BOWEL MOVEMENT A DAY!    °Drink plenty of fluids.  Choose water first. °TAKE A FIBER SUPPLEMENT EVERY DAY THE REST OF YOUR LIFE °During your first week back home, gradually add back a fiber supplement every day °Experiment which form you can tolerate.   There are many forms such as powders, tablets, wafers, gummies, etc °Psyllium bran (Metamucil), methylcellulose (Citrucel), Miralax or Glycolax, Benefiber, Flax Seed.  °Adjust the dose week-by-week (1/2 dose/day to 6 doses a day) until you are moving your bowels 1-2 times a day.  Cut back the dose or try a different fiber product if it is giving you problems such as diarrhea or bloating. °Sometimes a laxative is needed to help jump-start bowels if constipated until the fiber supplement can help regulate your  bowels.  If you are tolerating eating & you are farting, it is okay to try a gentle laxative such as double dose MiraLax, prune juice, or Milk of Magnesia.  Avoid using laxatives too often. °Stool softeners can sometimes help counteract the constipating effects of narcotic pain medicines.  It can also cause diarrhea, so avoid using for too long. °If you are still constipated despite taking fiber daily, eating solids, and a few doses of laxatives, call our office. °Controlling diarrhea °Try drinking liquids and eating bland foods for a few days to avoid stressing your intestines further. °Avoid dairy products (especially milk & ice cream) for a short time.  The intestines often can lose the ability to digest lactose when stressed. °Avoid foods that cause gassiness or   bloating.  Typical foods include beans and other legumes, cabbage, broccoli, and dairy foods.  Avoid greasy, spicy, fast foods.  Every person has some sensitivity to other foods, so listen to your body and avoid those foods that trigger problems for you. °Probiotics (such as active yogurt, Align, etc) may help repopulate the intestines and colon with normal bacteria and calm down a sensitive digestive tract °Adding a fiber supplement gradually can help thicken stools by absorbing excess fluid and retrain the intestines to act more normally.  Slowly increase the dose over a few weeks.  Too much fiber too soon can backfire and cause cramping & bloating. °It is okay to try and slow down diarrhea with a few doses of antidiarrheal medicines.   °Bismuth subsalicylate (ex. Kayopectate, Pepto Bismol) for a few doses can help control diarrhea.  Avoid if pregnant.   °Loperamide (Imodium) can slow down diarrhea.  Start with one tablet (2mg) first.  Avoid if you are having fevers or severe pain.  °ILEOSTOMY PATIENTS WILL HAVE CHRONIC DIARRHEA since their colon is not in use.    °Drink plenty of liquids.  You will need to drink even more glasses of water/liquid a day  to avoid getting dehydrated. °Record output from your ileostomy.  Expect to empty the bag every 3-4 hours at first.  Most people with a permanent ileostomy empty their bag 4-6 times at the least.   °Use antidiarrheal medicine (especially Imodium) several times a day to avoid getting dehydrated.  Start with a dose at bedtime & breakfast.  Adjust up or down as needed.  Increase antidiarrheal medications as directed to avoid emptying the bag more than 8 times a day (every 3 hours). °Work with your wound ostomy nurse to learn care for your ostomy.  See ostomy care instructions. °TROUBLESHOOTING IRREGULAR BOWELS °1) Start with a soft & bland diet. No spicy, greasy, or fried foods.  °2) Avoid gluten/wheat or dairy products from diet to see if symptoms improve. °3) Miralax 17gm or flax seed mixed in 8oz. water or juice-daily. May use 2-4 times a day as needed. °4) Gas-X, Phazyme, etc. as needed for gas & bloating.  °5) Prilosec (omeprazole) over-the-counter as needed °6)  Consider probiotics (Align, Activa, etc) to help calm the bowels down ° °Call your doctor if you are getting worse or not getting better.  Sometimes further testing (cultures, endoscopy, X-ray studies, CT scans, bloodwork, etc.) may be needed to help diagnose and treat the cause of the diarrhea. °[facility] °[address], [address], [place], [place]  [address] °[phone] - Main.    °[phone]  - Toll Free.   [phone] - Fax °[email or web address] ° °

## 2017-08-15 NOTE — Plan of Care (Signed)
Nutrition Education Note  RD consulted for nutrition education regarding ileostomy.   RD provided "Ileostomy Nutrition Therapy" handout from the [employer].  Reviewed patient's dietary recall. Pt already familiar with much of the information RD discussed.  Advised patient on ways to reduce gas output such as eating meals and snacks at the same times everyday, avoiding acidic, fried, sugary, fatty, and greasy foods. Advised patient to avoid straws, carbonated beverages, chewing gum or tobacco, eating too fast, and skipping meals. Provided patient with a list of gas and odor producing foods and advised patient to reintroduce fiber slowly. Recommended patient drink plenty of water and made patient aware of the increased risk of dehydration. Advised patient to chew foods well and to use chewable multivitamins for better absorption.   Teach back method used.  Expect fair compliance.  Body mass index is 25.9 kg/m. Pt meets criteria for Overweight based on current BMI.  Current diet order is Heart Healthy, patient is consuming approximately 50% of meals at this time.  Labs and medications reviewed. No further nutrition interventions warranted at this time. If additional nutrition issues arise, please re-consult RD.  Gaynell Face, MS, RD, LDN Pager: [phone] Weekend/After Hours: [phone]

## 2017-08-15 NOTE — Discharge Summary (Signed)
Physician Discharge Summary  Patient ID: Wanda Baker MRN: [id number] DOB/AGE: 12/31/1960  57 y.o.  Admit date: 08/08/2017 Discharge date: 08/15/2017   Patient Care Team: Tonette Bihari, MD as PCP - General (Family Medicine) Ileana Roup, MD as Consulting Physician (General Surgery) Fanny Skates, MD as Consulting Physician (General Surgery) Mauri Pole, MD as Consulting Physician (Gastroenterology)  Discharge Diagnoses:  Principal Problem:   S/p colostomy takedown with protective loop ileostomy 08/08/2017 Active Problems:   Rectal cancer ([facility])   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   Ileostomy in place for fecal diversion   HYPERTENSION, BENIGN ESSENTIAL   Diabetes type 2, controlled ([place])   Sinus tachycardia   Anemia of chronic disease   Hyperlipidemia   GERD (gastroesophageal reflux disease)   Chronic back pain   Weakness   7 Days Post-Op  08/08/2017  POST-OPERATIVE DIAGNOSIS:   Rectal cancer s/p Hartmann's procedure  SURGERY:  08/08/2017  Procedure(s): EXPLORATORY LAPAROTOMY HARTMANNS TAKEDOWN OF COLOSTOMY WITH COLORECTAL ANASTOMOIS ERAS PATHWAY FLEXIBLE SIGMOIDOSCOPY DIVERTIN  ILEOSTOMY CYSTOSCOPY WITH URETERAL CATHETERS LYSIS OF ADHESION SMALL BOWEL RESECTION x 2  SURGEON:    Surgeon(s): Ileana Roup, MD Ardis Hughs, MD Michael Boston, MD  Consults: Internal Medicine  Hospital Course:   The patient is a pleasant woman with a obstructing rectal cancer requiring urgent low anterior Hartmann resection.  Post adjuvant chemo radiation therapy.  No evidence of recurrent disease.  Underwent prolonged lysis of adhesions with bowel resection and colostomy takedown.  Diverting loop ileostomy done given the low anastomosis and fibrotic poor tissues.  Postoperatively, the patient had persistent tachycardia.  No evidence of any bleeding leak or abscess.  Medical sole consultation made.   Beta-blocker started.  Tachycardia improved.  Hemoglobin remained stable.  Hypokalemia corrected.  She was transferred to the floor.  She was mobilizing in the hallways.  She is back on her chronic pain medications.  She had advanced to a solid diet.  Wound ostomy consultation made.  She was familiar with ostomy care.  Clarification made on differences between ileostomy and colostomy done.  It by the time of discharge she was walking the hallways, pain controlled with medications by mouth, tolerating a solid diet, no severe output from the ileostomy.  Controlled with Metamucil and as needed Imodium.      By the time of discharge, the patient was walking well the hallways, eating food, having flatus.  Pain was well-controlled on an oral medications.  Based on meeting discharge criteria and continuing to recover, I felt it was safe for the patient to be discharged from the hospital to further recover with close followup. Postoperative recommendations were discussed in detail.  They are written as well.  Discharged Condition: good  Disposition:  Follow-up Information    Tonette Bihari, MD. Schedule an appointment as soon as possible for a visit in 1 week(s).   Specialty:  Family Medicine Contact information: [place] [address] [phone]        Ileana Roup, MD. Schedule an appointment as soon as possible for a visit in 3 week(s).   Specialty:  General Surgery Why:  to follow up on surgery Contact information: [place] [address] [phone]        [facility], [place]. Schedule an appointment as soon as possible for a visit in 5 day(s).   Specialty:  General Surgery Why:  Call for a nurse only visit  to have the nurses remove the staples from your incision Contact information: [address] [place] [phone]          06-Home-Health Care Svc  Discharge Instructions     Call MD for:   Complete by:  As directed    FEVER > 101.5 F  (temperatures < 101.5 F are not significant)   Call MD for:  extreme fatigue   Complete by:  As directed    Call MD for:  persistant dizziness or light-headedness   Complete by:  As directed    Call MD for:  persistant nausea and vomiting   Complete by:  As directed    Call MD for:  redness, tenderness, or signs of infection (pain, swelling, redness, odor or green/yellow discharge around incision site)   Complete by:  As directed    Call MD for:  severe uncontrolled pain   Complete by:  As directed    Diet - low sodium heart healthy   Complete by:  As directed    Start with a bland diet such as soups, liquids, starchy foods, low fat foods, etc. the first few days at home. Gradually advance to a solid, low-fat, high fiber diet by the end of the first week at home.   Add a fiber supplement to your diet (Metamucil, etc) If you feel full, bloated, or constipated, stay on a full liquid or pureed/blenderized diet for a few days until you feel better and are no longer constipated.   Discharge instructions   Complete by:  As directed    See Discharge Instructions If you are not getting better after two weeks or are noticing you are getting worse, contact our office [phone] for further advice.  We may need to adjust your medications, re-evaluate you in the office, send you to the emergency room, or see what other things we can do to help. The clinic staff is available to answer your questions during regular business hours (8:30am-5pm).  Please don't hesitate to call and ask to speak to one of our nurses for clinical concerns.    A surgeon from [facility] is always on call at the hospitals 24 hours/day If you have a medical emergency, go to the nearest emergency room or call 911.   Discharge wound care:   Complete by:  As directed    It is good for closed incision and even open wounds to be washed every day.  Shower  every day.  Short baths are fine.  Wash the incisions and wounds clean with soap & water.    If you have a closed incision(s), wash the incision with soap & water every day.  You may leave closed incisions open to air if it is dry.   You may cover the incision with clean gauze & replace it after your daily shower for comfort. If you have skin tapes (Steristrips) or skin glue (Dermabond) on your incision, leave them in place.  They will fall off on their own like a scab.  You may trim any edges that curl up with clean scissors.  If you have staples, set up an appointment for them to be removed in the office in 10 days after surgery.  If you have a drain, wash around the skin exit site with soap & water and place a new dressing of gauze or band aid around the skin every day.  Keep the drain site clean & dry.  Driving Restrictions   Complete by:  As directed    You may drive when: - you are no longer taking narcotic prescription pain medication - you can comfortably wear a seatbelt - you can safely make sudden turns/stops without pain.   Increase activity slowly   Complete by:  As directed    Start light daily activities --- self-care, walking, climbing stairs- beginning the day after surgery.  Gradually increase activities as tolerated.  Control your pain to be active.  Stop when you are tired.  Ideally, walk several times a day, eventually an hour a day.   Most people are back to most day-to-day activities in a few weeks.  It takes 4-6 weeks to get back to unrestricted, intense activity. If you can walk 30 minutes without difficulty, it is safe to try more intense activity such as jogging, treadmill, bicycling, low-impact aerobics, swimming, etc. Save the most intensive and strenuous activity for last (Usually 4-8 weeks after surgery) such as sit-ups, heavy lifting, contact sports, etc.  Refrain from any intense heavy lifting or straining until you are off narcotics for pain control.  You will have  off days, but things should improve week-by-week. DO NOT PUSH THROUGH PAIN.  Let pain be your guide: If it hurts to do something, don't do it.   Lifting restrictions   Complete by:  As directed    If you can walk 30 minutes without difficulty, it is safe to try more intense activity such as jogging, treadmill, bicycling, low-impact aerobics, swimming, etc. Save the most intensive and strenuous activity for last (Usually 4-8 weeks after surgery) such as sit-ups, heavy lifting, contact sports, etc.   Refrain from any intense heavy lifting or straining until you are off narcotics for pain control.  You will have off days, but things should improve week-by-week. DO NOT PUSH THROUGH PAIN.  Let pain be your guide: If it hurts to do something, don't do it.  Pain is your body warning you to avoid that activity for another week until the pain goes down.   May shower / Bathe   Complete by:  As directed    May walk up steps   Complete by:  As directed    Sexual Activity Restrictions   Complete by:  As directed    You may have sexual intercourse when it is comfortable. If it hurts to do something, stop.      Allergies as of 08/15/2017      Reactions   Hydrocodone    Palpitations / dizziness   Nsaids    GERD "tears my stomach up"   Penicillins Swelling, Rash   Has patient had a PCN reaction causing immediate rash, facial/tongue/throat swelling, SOB or lightheadedness with hypotension: Yes Has patient had a PCN reaction causing severe rash involving mucus membranes or skin necrosis: No Has patient had a PCN reaction that required hospitalization No Has patient had a PCN reaction occurring within the last 10 years: No If all of the above answers are "NO", then may proceed with Cephalosporin use.   Prednisone Rash      Medication List    TAKE these medications   cetirizine 10 MG tablet Commonly known as:  ZYRTEC Take 10 mg by mouth every evening.   ferrous sulfate 325 (65 FE) MG tablet Take 1  tablet (325 mg total) by mouth 2 (two) times daily with a meal.   gabapentin 400 MG capsule Commonly known as:  NEURONTIN Take 1 capsule (400 mg  total) by mouth 3 (three) times daily. What changed:    medication strength  how much to take   metoprolol tartrate 50 MG tablet Commonly known as:  LOPRESSOR Take 1 tablet (50 mg total) by mouth daily.   pantoprazole 40 MG tablet Commonly known as:  PROTONIX Take 1 tablet (40 mg total) by mouth daily at 12 noon.   saccharomyces boulardii 250 MG capsule Commonly known as:  FLORASTOR Take 1 capsule (250 mg total) by mouth 2 (two) times daily.   traMADol 50 MG tablet Commonly known as:  ULTRAM Take 1-2 tablets (50-100 mg total) by mouth every 6 (six) hours as needed for moderate pain or severe pain (pain not controlled with tylenol, gabapentin, ibuprofen).   TYLENOL 8 HOUR ARTHRITIS PAIN 650 MG CR tablet Generic drug:  acetaminophen Take 1,300 mg by mouth every 8 (eight) hours as needed for pain.            Discharge Care Instructions  (From admission, onward)        Start     Ordered   08/15/17 0000  Discharge wound care:    Comments:  It is good for closed incision and even open wounds to be washed every day.  Shower every day.  Short baths are fine.  Wash the incisions and wounds clean with soap & water.    If you have a closed incision(s), wash the incision with soap & water every day.  You may leave closed incisions open to air if it is dry.   You may cover the incision with clean gauze & replace it after your daily shower for comfort. If you have skin tapes (Steristrips) or skin glue (Dermabond) on your incision, leave them in place.  They will fall off on their own like a scab.  You may trim any edges that curl up with clean scissors.  If you have staples, set up an appointment for them to be removed in the office in 10 days after surgery.  If you have a drain, wash around the skin exit site with soap & water and place a  new dressing of gauze or band aid around the skin every day.  Keep the drain site clean & dry.   08/15/17 0800      Significant Diagnostic Studies:  Results for orders placed or performed during the hospital encounter of 08/08/17 (from the past 72 hour(s))  Glucose, capillary     Status: Abnormal   Collection Time: 08/12/17 11:34 AM  Result Value Ref Range   Glucose-Capillary 125 (H) 65 - 99 mg/dL  Glucose, capillary     Status: None   Collection Time: 08/12/17  5:43 PM  Result Value Ref Range   Glucose-Capillary 75 65 - 99 mg/dL  Glucose, capillary     Status: Abnormal   Collection Time: 08/12/17  8:09 PM  Result Value Ref Range   Glucose-Capillary 113 (H) 65 - 99 mg/dL  Glucose, capillary     Status: None   Collection Time: 08/13/17 12:12 AM  Result Value Ref Range   Glucose-Capillary 97 65 - 99 mg/dL  Glucose, capillary     Status: None   Collection Time: 08/13/17  4:23 AM  Result Value Ref Range   Glucose-Capillary 83 65 - 99 mg/dL  CBC     Status: Abnormal   Collection Time: 08/13/17  5:34 AM  Result Value Ref Range   WBC 8.7 4.0 - 10.5 K/uL   RBC 3.03 (L) 3.87 -  5.11 MIL/uL   Hemoglobin 9.0 (L) 12.0 - 15.0 g/dL   HCT 27.0 (L) 36.0 - 46.0 %   MCV 89.1 78.0 - 100.0 fL   MCH 29.7 26.0 - 34.0 pg   MCHC 33.3 30.0 - 36.0 g/dL   RDW 13.2 11.5 - 15.5 %   Platelets 239 150 - 400 K/uL    Comment: Performed at [facility], [place] [address], [place], [place] [address]  Basic metabolic panel     Status: Abnormal   Collection Time: 08/13/17  5:34 AM  Result Value Ref Range   Sodium 139 135 - 145 mmol/L   Potassium 3.5 3.5 - 5.1 mmol/L   Chloride 105 101 - 111 mmol/L   CO2 26 22 - 32 mmol/L   Glucose, Bld 91 65 - 99 mg/dL   BUN 11 6 - 20 mg/dL   Creatinine, Ser 0.53 0.44 - 1.00 mg/dL   Calcium 8.2 (L) 8.9 - 10.3 mg/dL   GFR calc non Af Amer >60 >60 mL/min   GFR calc Af Amer >60 >60 mL/min    Comment: (NOTE) The eGFR has been calculated using the CKD EPI  equation. This calculation has not been validated in all clinical situations. eGFR's persistently <60 mL/min signify possible Chronic Kidney Disease.    Anion gap 8 5 - 15    Comment: Performed at [facility], [place] [address] [place], [place] [address]  Magnesium     Status: None   Collection Time: 08/13/17  5:34 AM  Result Value Ref Range   Magnesium 1.9 1.7 - 2.4 mg/dL    Comment: Performed at [facility], [place] [address], [place], [place] [address]  Phosphorus     Status: None   Collection Time: 08/13/17  5:34 AM  Result Value Ref Range   Phosphorus 3.1 2.5 - 4.6 mg/dL    Comment: Performed at [facility], [place] [address], [place], [place] [address]  Glucose, capillary     Status: None   Collection Time: 08/13/17  7:42 AM  Result Value Ref Range   Glucose-Capillary 84 65 - 99 mg/dL  Glucose, capillary     Status: None   Collection Time: 08/13/17 12:00 PM  Result Value Ref Range   Glucose-Capillary 90 65 - 99 mg/dL  Glucose, capillary     Status: None   Collection Time: 08/13/17  3:58 PM  Result Value Ref Range   Glucose-Capillary 92 65 - 99 mg/dL  Glucose, capillary     Status: Abnormal   Collection Time: 08/13/17  8:19 PM  Result Value Ref Range   Glucose-Capillary 114 (H) 65 - 99 mg/dL  Glucose, capillary     Status: None   Collection Time: 08/13/17 11:50 PM  Result Value Ref Range   Glucose-Capillary 90 65 - 99 mg/dL  Glucose, capillary     Status: None   Collection Time: 08/14/17  4:25 AM  Result Value Ref Range   Glucose-Capillary 73 65 - 99 mg/dL  Glucose, capillary     Status: None   Collection Time: 08/14/17  7:30 AM  Result Value Ref Range   Glucose-Capillary 92 65 - 99 mg/dL  Glucose, capillary     Status: Abnormal   Collection Time: 08/14/17 12:03 PM  Result Value Ref Range   Glucose-Capillary 109 (H) 65 - 99 mg/dL  Glucose, capillary     Status: Abnormal   Collection Time: 08/14/17  5:24 PM   Result Value Ref  Range   Glucose-Capillary 105 (H) 65 - 99 mg/dL  Glucose, capillary     Status: Abnormal   Collection Time: 08/14/17  8:05 PM  Result Value Ref Range   Glucose-Capillary 124 (H) 65 - 99 mg/dL  Glucose, capillary     Status: None   Collection Time: 08/14/17 11:47 PM  Result Value Ref Range   Glucose-Capillary 90 65 - 99 mg/dL  Glucose, capillary     Status: Abnormal   Collection Time: 08/15/17  3:59 AM  Result Value Ref Range   Glucose-Capillary 104 (H) 65 - 99 mg/dL  Potassium     Status: Abnormal   Collection Time: 08/15/17  4:45 AM  Result Value Ref Range   Potassium 3.2 (L) 3.5 - 5.1 mmol/L    Comment: Performed at [facility], [place] [address], [place], [place] [address]  Creatinine, serum     Status: None   Collection Time: 08/15/17  4:45 AM  Result Value Ref Range   Creatinine, Ser 0.54 0.44 - 1.00 mg/dL   GFR calc non Af Amer >60 >60 mL/min   GFR calc Af Amer >60 >60 mL/min    Comment: (NOTE) The eGFR has been calculated using the CKD EPI equation. This calculation has not been validated in all clinical situations. eGFR's persistently <60 mL/min signify possible Chronic Kidney Disease. Performed at [facility], [place] [address] [place], [place] [address]   Hemoglobin     Status: Abnormal   Collection Time: 08/15/17  4:45 AM  Result Value Ref Range   Hemoglobin 8.5 (L) 12.0 - 15.0 g/dL    Comment: Performed at [facility], [place] [address] [place], [place] [address]  Glucose, capillary     Status: None   Collection Time: 08/15/17  7:45 AM  Result Value Ref Range   Glucose-Capillary 85 65 - 99 mg/dL    Ct Abdomen Pelvis W Contrast  Result Date: 08/11/2017 CLINICAL DATA:  Abdominal distension. EXAM: CT ABDOMEN AND PELVIS WITH CONTRAST TECHNIQUE: Multidetector CT imaging of the abdomen and pelvis was performed using the standard protocol following bolus administration of intravenous contrast.  CONTRAST:  10m ISOVUE-300 IOPAMIDOL (ISOVUE-300) INJECTION 61% orally, 1030mISOVUE-300 IOPAMIDOL (ISOVUE-300) INJECTION 61% intravenously. COMPARISON:  CT scan of September 16, 2016. FINDINGS: Lower chest: Mild bilateral pleural effusions are noted with adjacent subsegmental atelectasis. Hepatobiliary: No gallstones are noted. Fatty infiltration of the liver is noted. Pancreas: Unremarkable. No pancreatic ductal dilatation or surrounding inflammatory changes. Spleen: Normal in size without focal abnormality. Adrenals/Urinary Tract: Adrenal glands appear normal. Left kidney and ureter are unremarkable. Mild right hydroureteronephrosis is noted without evidence of obstructive calculus. Urinary bladder is decompressed secondary to Foley catheter. Stomach/Bowel: Postsurgical changes are seen in the sigmoid rectum region. Ileostomy is noted in the right lower quadrant. Mildly dilated small bowel loops are noted which may represent postoperative ileus. Vascular/Lymphatic: Aortic atherosclerosis. No enlarged abdominal or pelvic lymph nodes. Reproductive: Status post hysterectomy. No adnexal masses. Other: Surgical drain is seen involving the left lower quadrant of the abdomen with distal tip in the pelvis. Mild anasarca is noted. Old colostomy site is seen in the left lower quadrant site. Minimal amount of free fluid is noted in the pelvis and pericolic gutters. Musculoskeletal: No acute or significant osseous findings. IMPRESSION: Mild bilateral pleural effusions with adjacent subsegmental atelectasis. Postsurgical changes are seen in the rectosigmoid region. Ileostomy is noted in right lower quadrant. Old colostomy site is seen in the left lower quadrant.  Mildly dilated small bowel loops are noted which most likely represent postoperative ileus. Fatty infiltration of the liver. Aortic Atherosclerosis (ICD10-I70.0). Electronically Signed   By: Marijo Conception, M.D.   On: 08/11/2017 14:12    Discharge Exam: Blood pressure  129/63, pulse 97, temperature 99.2 F (37.3 C), temperature source Oral, resp. rate 18, height 5' 7"  (1.702 m), weight 75 kg (165 lb 5.5 oz), SpO2 100 %.  General: Pt awake/alert/oriented x4 in No acute distress Eyes: PERRL, normal EOM.  Sclera clear.  No icterus Neuro: CN II-XII intact w/o focal sensory/motor deficits. Lymph: No head/neck/groin lymphadenopathy Psych:  No delerium/psychosis/paranoia HENT: Normocephalic, Mucus membranes moist.  No thrush Neck: Supple, No tracheal deviation Chest: No chest wall pain w good excursion CV:  Pulses intact.  Regular rhythm MS: Normal AROM mjr joints.  No obvious deformity Abdomen: Soft.  Nondistended.  Mildly tender at incisions only.  Staples intact.  Ileostomy in place.  Pink with gas and succus.  Drain has been removed.  No evidence of peritonitis.  No incarcerated hernias. Ext:  SCDs BLE.  No mjr edema.  No cyanosis Skin: No petechiae / purpura  Past Medical History:  Diagnosis Date  . Adenocarcinoma of colon ([place]) 09/23/2016  . Allergy    uses Nasocort daily and takes Singulair nightly   . Arthritis   . Chronic back pain    buldging disc  . Diabetes mellitus without complication ([facility])    taikes Metformin daily  . GERD (gastroesophageal reflux disease)    takes Omeprazole daily   . History of bronchitis    many yrs ago  . Hyperlipidemia    takes Atorvastatin daily  . Hypertension    takes Lisinopril daily as well HCTZ  . 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 Weakness    left arm    Past Surgical History:  Procedure Laterality Date  . ABDOMINAL HYSTERECTOMY  2011   Supracervical Hysterectomy  . ANTERIOR CERVICAL DECOMP/DISCECTOMY FUSION N/A 12/02/2013   Procedure: ANTERIOR CERVICAL DECOMPRESSION/DISCECTOMY FUSION 3 LEVELS;  Surgeon: Erline Levine, MD;  Location: [facility];  Service: Neurosurgery;  Laterality: N/A;  C4-5 C5-6 C6-7 Anterior cervical decompression/diskectomy/fusion  .  BOWEL RESECTION N/A 09/18/2016   Procedure: LOW ANTERIOR COLON RESECTION WITH COLOSTOMY;  Surgeon: Fanny Skates, MD;  Location: [facility];  Service: General;  Laterality: N/A;  . BOWEL RESECTION  08/08/2017   Procedure: SMALL BOWEL RESECTION x 2;  Surgeon: Ileana Roup, MD;  Location: [facility];  Service: General;;  . CESAREAN SECTION  86/88  . COLOSTOMY TAKEDOWN N/A 08/08/2017   Procedure: HARTMANNS TAKEDOWN OF COLOSTOMY WITH COLORECTAL ANASTOMOIS ERAS PATHWAY;  Surgeon: Ileana Roup, MD;  Location: [facility];  Service: General;  Laterality: N/A;  . CYSTOSCOPY W/ URETERAL STENT PLACEMENT N/A 08/08/2017   Procedure: CYSTOSCOPY WITH URETERAL CATHETERS;  Surgeon: Ardis Hughs, MD;  Location: [facility];  Service: Urology;  Laterality: N/A;  . FLEXIBLE SIGMOIDOSCOPY N/A 09/17/2016   Procedure: FLEXIBLE SIGMOIDOSCOPY;  Surgeon: Mauri Pole, MD;  Location: [facility];  Service: Endoscopy;  Laterality: N/A;  . FLEXIBLE SIGMOIDOSCOPY N/A 08/08/2017   Procedure: FLEXIBLE SIGMOIDOSCOPY;  Surgeon: Ileana Roup, MD;  Location: [facility];  Service: General;  Laterality: N/A;  . ILEOSTOMY N/A 08/08/2017   Procedure: Ladon Applebaum  ILEOSTOMY;  Surgeon: Ileana Roup, MD;  Location: [facility];  Service: General;  Laterality: N/A;  . LAPAROTOMY N/A 08/08/2017   Procedure: EXPLORATORY LAPAROTOMY;  Surgeon: Ileana Roup, MD;  Location: [facility];  Service: General;  Laterality: N/A;  . LYSIS OF ADHESION  08/08/2017   Procedure: LYSIS OF ADHESION;  Surgeon: Ileana Roup, MD;  Location: [facility];  Service: General;;  . TUBAL LIGATION      Social History   Socioeconomic History  . Marital status: Divorced    Spouse name: Not on file  . Number of children: Not on file  . Years of education: Not on file  . Highest education level: Not on file  Social Needs  . Financial resource strain: Not on file  . Food insecurity - worry: Not on file  . Food insecurity - inability: Not on file   . Transportation needs - medical: Not on file  . Transportation needs - non-medical: Not on file  Occupational History  . Not on file  Tobacco Use  . Smoking status: Former Research scientist (life sciences)  . Smokeless tobacco: Never Used  . Tobacco comment: quit smoking 63yr ago  Substance and Sexual Activity  . Alcohol use: No  . Drug use: No  . Sexual activity: No    Birth control/protection: Surgical  Other Topics Concern  . Not on file  Social History Narrative   lives with adult sons and mother and father   divorced   242YO son has autism    Family History  Problem Relation Age of Onset  . Heart disease Maternal Grandmother   . Diabetes Maternal Grandmother   . Hypertension Maternal Grandmother   . Heart disease Maternal Grandfather   . Heart disease Paternal Grandmother   . Diabetes Paternal Grandmother   . Alzheimer's disease Paternal Grandmother     Current Facility-Administered Medications  Medication Dose Route Frequency Provider Last Rate Last Dose  . 0.9 %  sodium chloride infusion  250 mL Intravenous PRN GMichael Boston MD      . acetaminophen (TYLENOL) tablet 1,000 mg  1,000 mg Oral TID GMichael Boston MD   1,000 mg at 08/14/17 2119  . alum & mag hydroxide-simeth (MAALOX/MYLANTA) 200-200-20 MG/5ML suspension 30 mL  30 mL Oral Q6H PRN WIleana Roup MD      . diphenhydrAMINE (BENADRYL) 12.5 MG/5ML elixir 12.5 mg  12.5 mg Oral Q6H PRN WIleana Roup MD       Or  . diphenhydrAMINE (BENADRYL) injection 12.5 mg  12.5 mg Intravenous Q6H PRN WIleana Roup MD      . enoxaparin (LOVENOX) injection 40 mg  40 mg Subcutaneous Q24H GMichael Boston MD   40 mg at 08/14/17 0917  . ferrous sulfate tablet 325 mg  325 mg Oral BID WC GMichael Boston MD   325 mg at 08/14/17 1718  . gabapentin (NEURONTIN) capsule 400 mg  400 mg Oral TID GMichael Boston MD   400 mg at 08/14/17 2120  . guaiFENesin-dextromethorphan (ROBITUSSIN DM) 100-10 MG/5ML syrup 10 mL  10 mL Oral Q4H PRN GMichael Boston  MD      . hydrocortisone (ANUSOL-HC) 2.5 % rectal cream 1 application  1 application Topical QID PRN GMichael Boston MD      . hydrocortisone cream 1 % 1 application  1 application Topical TID PRN GMichael Boston MD      . HYDROmorphone (DILAUDID) injection 0.5-1 mg  0.5-1 mg Intravenous Q2H PRN GMichael Boston MD   1 mg at 08/13/17 1326  . insulin aspart (novoLOG) injection 0-15 Units  0-15 Units Subcutaneous Q4H WGreer Pickerel MD   2 Units at 08/14/17 2034  .  iopamidol (ISOVUE-300) 61 % injection 15 mL  15 mL Oral Once PRN Ileana Roup, MD   15 mL at 08/11/17 1130  . lactated ringers bolus 1,000 mL  1,000 mL Intravenous Q8H PRN Michael Boston, MD      . lip balm (CARMEX) ointment 1 application  1 application Topical BID Michael Boston, MD   1 application at 09/38/18 2245  . loperamide (IMODIUM) capsule 2-4 mg  2-4 mg Oral Q8H PRN Michael Boston, MD      . loratadine (CLARITIN) tablet 10 mg  10 mg Oral Daily Michael Boston, MD   10 mg at 08/14/17 2993  . magic mouthwash  15 mL Oral QID PRN Michael Boston, MD      . menthol-cetylpyridinium (CEPACOL) lozenge 3 mg  1 lozenge Oral PRN Michael Boston, MD      . methocarbamol (ROBAXIN) 1,000 mg in dextrose 5 % 50 mL IVPB  1,000 mg Intravenous Q6H PRN Michael Boston, MD      . methocarbamol (ROBAXIN) tablet 750 mg  750 mg Oral TID Michael Boston, MD   750 mg at 08/14/17 2118  . metoCLOPramide (REGLAN) tablet 5 mg  5 mg Oral TID Rogelia Mire, MD   5 mg at 08/14/17 1718  . metoprolol tartrate (LOPRESSOR) injection 5 mg  5 mg Intravenous Q6H PRN Ileana Roup, MD      . metoprolol tartrate (LOPRESSOR) tablet 50 mg  50 mg Oral BID Ileana Roup, MD   50 mg at 08/14/17 2123  . ondansetron (ZOFRAN) tablet 4 mg  4 mg Oral Q6H PRN Ileana Roup, MD   4 mg at 08/15/17 7169   Or  . ondansetron [facility]) injection 4 mg  4 mg Intravenous Q6H PRN Ileana Roup, MD   4 mg at 08/14/17 0841  . pantoprazole (PROTONIX) EC tablet 40 mg  40 mg  Oral Q1200 Michael Boston, MD   40 mg at 08/14/17 1251  . phenol (CHLORASEPTIC) mouth spray 1-2 spray  1-2 spray Mouth/Throat PRN Michael Boston, MD      . potassium chloride SA (K-DUR,KLOR-CON) CR tablet 40 mEq  40 mEq Oral Once Michael Boston, MD      . promethazine (PHENERGAN) injection 6.25 mg  6.25 mg Intravenous Q6H PRN Earnstine Regal, PA-C   6.25 mg at 08/13/17 0747  . psyllium (HYDROCIL/METAMUCIL) packet 1 packet  1 packet Oral BID Michael Boston, MD   1 packet at 08/14/17 2209  . saccharomyces boulardii (FLORASTOR) capsule 250 mg  250 mg Oral BID Michael Boston, MD   250 mg at 08/14/17 2119  . sodium chloride 0.9 % bolus 500 mL  500 mL Intravenous Once PRN Ileana Roup, MD      . sodium chloride flush (NS) 0.9 % injection 3 mL  3 mL Intravenous Gorden Harms, MD   3 mL at 08/14/17 2210  . sodium chloride flush (NS) 0.9 % injection 3 mL  3 mL Intravenous PRN Michael Boston, MD      . traMADol Veatrice Bourbon) tablet 50-100 mg  50-100 mg Oral Q6H PRN Michael Boston, MD   100 mg at 08/15/17 0421     Allergies  Allergen Reactions  . Hydrocodone     Palpitations / dizziness  . Nsaids     GERD "tears my stomach up"  . Penicillins Swelling and Rash    Has patient had a PCN reaction causing immediate rash, facial/tongue/throat swelling, SOB or lightheadedness with hypotension: Yes Has patient  had a PCN reaction causing severe rash involving mucus membranes or skin necrosis: No Has patient had a PCN reaction that required hospitalization No Has patient had a PCN reaction occurring within the last 10 years: No If all of the above answers are "NO", then may proceed with Cephalosporin use.   . Prednisone Rash    Signed: Morton Peters, M.D., F.A.C.S. Gastrointestinal and Minimally Invasive Surgery [facility] [address], [place], [place] [address] [phone] Main / Paging   08/15/2017, 8:01 AM

## 2017-08-15 NOTE — Progress Notes (Signed)
Received a referral for [facility] assistance with ileostomy care, patient has had a colostomy previously. Previously active with [facility], contacted them for referral, they are unable to accept at this time. Contacted [facility], [facility], [facility], and [facility] all who were unable to accept. Contacted Michelle with [facility] who was able to accept referral and f/u with the patient this weekend. Contacted nurse to obtain orders and let her know arrangements. [phone]

## 2017-09-01 ENCOUNTER — Other Ambulatory Visit: Payer: Self-pay

## 2017-09-01 ENCOUNTER — Encounter (HOSPITAL_COMMUNITY): Payer: Self-pay | Admitting: Emergency Medicine

## 2017-09-01 ENCOUNTER — Emergency Department (HOSPITAL_COMMUNITY)
Admission: EM | Admit: 2017-09-01 | Discharge: 2017-09-02 | Disposition: A | Discharge status: Home / Self Care | Payer: Medicaid Other | Attending: Physician Assistant | Admitting: Physician Assistant

## 2017-09-01 DIAGNOSIS — E785 Hyperlipidemia, unspecified: Secondary | ICD-10-CM | POA: Insufficient documentation

## 2017-09-01 DIAGNOSIS — I10 Essential (primary) hypertension: Secondary | ICD-10-CM | POA: Insufficient documentation

## 2017-09-01 DIAGNOSIS — R112 Nausea with vomiting, unspecified: Secondary | ICD-10-CM | POA: Diagnosis present

## 2017-09-01 DIAGNOSIS — N10 Acute tubulo-interstitial nephritis: Secondary | ICD-10-CM | POA: Insufficient documentation

## 2017-09-01 DIAGNOSIS — Z87891 Personal history of nicotine dependence: Secondary | ICD-10-CM | POA: Diagnosis not present

## 2017-09-01 DIAGNOSIS — N12 Tubulo-interstitial nephritis, not specified as acute or chronic: Secondary | ICD-10-CM

## 2017-09-01 DIAGNOSIS — Z79899 Other long term (current) drug therapy: Secondary | ICD-10-CM | POA: Diagnosis not present

## 2017-09-01 DIAGNOSIS — E119 Type 2 diabetes mellitus without complications: Secondary | ICD-10-CM | POA: Insufficient documentation

## 2017-09-01 LAB — URINALYSIS, ROUTINE W REFLEX MICROSCOPIC
Bilirubin Urine: NEGATIVE
GLUCOSE, UA: NEGATIVE mg/dL
KETONES UR: 20 mg/dL — AB
Nitrite: POSITIVE — AB
Protein, ur: 100 mg/dL — AB
Specific Gravity, Urine: 1.014 (ref 1.005–1.030)
pH: 6 (ref 5.0–8.0)

## 2017-09-01 LAB — CBC
HCT: 35.5 % — ABNORMAL LOW (ref 36.0–46.0)
Hemoglobin: 11.4 g/dL — ABNORMAL LOW (ref 12.0–15.0)
MCH: 27.1 pg (ref 26.0–34.0)
MCHC: 32.1 g/dL (ref 30.0–36.0)
MCV: 84.5 fL (ref 78.0–100.0)
PLATELETS: 602 10*3/uL — AB (ref 150–400)
RBC: 4.2 MIL/uL (ref 3.87–5.11)
RDW: 13.6 % (ref 11.5–15.5)
WBC: 9.8 10*3/uL (ref 4.0–10.5)

## 2017-09-01 LAB — COMPREHENSIVE METABOLIC PANEL
ALK PHOS: 104 U/L (ref 38–126)
ALT: 62 U/L — AB (ref 14–54)
AST: 49 U/L — ABNORMAL HIGH (ref 15–41)
Albumin: 3.4 g/dL — ABNORMAL LOW (ref 3.5–5.0)
Anion gap: 13 (ref 5–15)
BILIRUBIN TOTAL: 1 mg/dL (ref 0.3–1.2)
BUN: 12 mg/dL (ref 6–20)
CO2: 24 mmol/L (ref 22–32)
CREATININE: 0.64 mg/dL (ref 0.44–1.00)
Calcium: 9.7 mg/dL (ref 8.9–10.3)
Chloride: 96 mmol/L — ABNORMAL LOW (ref 101–111)
Glucose, Bld: 118 mg/dL — ABNORMAL HIGH (ref 65–99)
Potassium: 4.2 mmol/L (ref 3.5–5.1)
Sodium: 133 mmol/L — ABNORMAL LOW (ref 135–145)
Total Protein: 8 g/dL (ref 6.5–8.1)

## 2017-09-01 LAB — LIPASE, BLOOD: Lipase: 39 U/L (ref 11–51)

## 2017-09-01 MED ORDER — SODIUM CHLORIDE 0.9 % IV BOLUS (SEPSIS)
1000.0000 mL | Freq: Once | INTRAVENOUS | Status: AC
  Administered 2017-09-01: 1000 mL via INTRAVENOUS

## 2017-09-01 MED ORDER — CIPROFLOXACIN HCL 500 MG PO TABS: 500.0000 mg | ORAL_TABLET | Freq: Two times a day (BID) | ORAL | 0 refills | Status: DC

## 2017-09-01 MED ORDER — ONDANSETRON HCL 4 MG/2ML IJ SOLN
4.0000 mg | Freq: Once | INTRAMUSCULAR | Status: AC
  Administered 2017-09-01: 4 mg via INTRAVENOUS
  Filled 2017-09-01: qty 2

## 2017-09-01 MED ORDER — ONDANSETRON HCL 4 MG PO TABS: 4.0000 mg | ORAL_TABLET | Freq: Three times a day (TID) | ORAL | 0 refills | Status: DC | PRN

## 2017-09-01 MED ORDER — AZTREONAM 1 G IJ SOLR
1.0000 g | Freq: Once | INTRAMUSCULAR | Status: AC
  Administered 2017-09-01: 1 g via INTRAVENOUS
  Filled 2017-09-01: qty 1

## 2017-09-01 MED ORDER — FENTANYL CITRATE (PF) 100 MCG/2ML IJ SOLN
100.0000 ug | Freq: Once | INTRAMUSCULAR | Status: AC
Start: 2017-09-01 — End: 2017-09-01
  Administered 2017-09-01: 100 ug via INTRAVENOUS
  Filled 2017-09-01: qty 2

## 2017-09-01 NOTE — ED Triage Notes (Signed)
Patient c/o burning sensation with urination. Nausea relieved after Zofran from EMS. Pt states concerned of dehydration from emesis.

## 2017-09-01 NOTE — ED Triage Notes (Signed)
Per EMS-patient had a reverse colostomy and a ileostomy placed in February-states out of the blue she started vomiting and having urinary pain about 3 pm-patient called surgeon and he told her to come to ED-4 mg of Zofran given IV-nausea has resolved-no bleeding noted

## 2017-09-01 NOTE — Discharge Instructions (Signed)
Follow attached handout Your urine showed evidence of infection during today's visit. I suspect this is the cause of your symptoms.   Please take all of your antibiotics until finished!   You may develop abdominal discomfort or diarrhea from the antibiotic.  You may help offset this with probiotics which you can buy or get in yogurt. Do not eat or take the probiotics until 2 hours after your antibiotic. Do not take your medicine if develop an itchy rash, swelling in your mouth or lips, or difficulty breathing.   Please follow up with you PCP in the next 1 week for repeat testing of  your plat lets. This was noted to be elevated during your visit today.   Take zofran as needed for nausea and vomiting.   Return if your symptoms are not improving or are worsening in 48 hours.

## 2017-09-01 NOTE — ED Provider Notes (Signed)
[facility] [place] [facility] DEPT Provider Note   CSN: [id number] Arrival date & time: 09/01/17  1617     History   Chief Complaint Chief Complaint  Patient presents with  . Nausea    HPI SECRET KRISTENSEN is a 57 y.o. female with past medical history for diabetes, hypertension, hyperlipidemia presenting to the emergency department today for urinary symptoms.  Patient states over the last 1 week she has developed urinary frequency, urinary urgency and dysuria.  She notes that today she started developing nausea and had one episode of emesis.  She has not taken anything for symptoms.  Nothing makes her symptoms better or worse. Patient recently underwent a reversal of a colostomy by Dr. Dema Severin on 08/08/2017.  She notes that since that time she has been doing well.  She denies any abdominal pain.  She reports that her ileostomy tube has been draining as appropriate.  She denies signs of infection around this area.  No flank pain, fever, chills, chest pain, shortness of breath, cough, diarrhea, vaginal discharge, vaginal bleeding at home.  HPI  Past Medical History:  Diagnosis Date  . Adenocarcinoma of colon ([place]) 09/23/2016  . Allergy    uses Nasocort daily and takes Singulair nightly   . Arthritis   . Chronic back pain    buldging disc  . Diabetes mellitus without complication (HCC)    taikes Metformin daily  . GERD (gastroesophageal reflux disease)    takes Omeprazole daily   . History of bronchitis    many yrs ago  . Hyperlipidemia    takes Atorvastatin daily  . Hypertension    takes Lisinopril daily as well HCTZ  . 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 Weakness    left arm    Patient Active Problem List   Diagnosis Date Noted  . Hyperlipidemia   . GERD (gastroesophageal reflux disease)   . Chronic back pain   . Weakness   . 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 S/p colostomy takedown with protective  loop ileostomy 08/08/2017 08/11/2017  . Physical deconditioning 10/02/2016  . Rectal cancer ([place]) 10/01/2016  . Anemia of chronic disease 09/27/2016  . 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 Sinus tachycardia 12/16/2013  . Cervical radiculopathy due to degenerative joint disease of spine 02/19/2013  . Diabetes type 2, controlled ([place]) 12/03/2011  . HYPERTENSION, BENIGN ESSENTIAL 11/20/2007    Past Surgical History:  Procedure Laterality Date  . ABDOMINAL HYSTERECTOMY  2011   Supracervical Hysterectomy  . ANTERIOR CERVICAL DECOMP/DISCECTOMY FUSION N/A 12/02/2013   Procedure: ANTERIOR CERVICAL DECOMPRESSION/DISCECTOMY FUSION 3 LEVELS;  Surgeon: Erline Levine, MD;  Location: [facility];  Service: Neurosurgery;  Laterality: N/A;  C4-5 C5-6 C6-7 Anterior cervical decompression/diskectomy/fusion  . BOWEL RESECTION N/A 09/18/2016   Procedure: LOW ANTERIOR COLON RESECTION WITH COLOSTOMY;  Surgeon: Fanny Skates, MD;  Location: [facility];  Service: General;  Laterality: N/A;  . BOWEL RESECTION  08/08/2017   Procedure: SMALL BOWEL RESECTION x 2;  Surgeon: Ileana Roup, MD;  Location: [facility];  Service: General;;  . CESAREAN SECTION  86/88  . COLOSTOMY TAKEDOWN N/A 08/08/2017   Procedure: HARTMANNS TAKEDOWN OF COLOSTOMY WITH COLORECTAL ANASTOMOIS ERAS PATHWAY;  Surgeon: Ileana Roup, MD;  Location: [facility];  Service: General;  Laterality: N/A;  . CYSTOSCOPY W/ URETERAL STENT PLACEMENT N/A 08/08/2017   Procedure: CYSTOSCOPY WITH URETERAL CATHETERS;  Surgeon: Ardis Hughs, MD;  Location: [facility];  Service: Urology;  Laterality: N/A;  . FLEXIBLE SIGMOIDOSCOPY N/A 09/17/2016   Procedure: FLEXIBLE SIGMOIDOSCOPY;  Surgeon: Mauri Pole, MD;  Location: [facility];  Service: Endoscopy;  Laterality: N/A;  . FLEXIBLE SIGMOIDOSCOPY N/A 08/08/2017   Procedure: FLEXIBLE SIGMOIDOSCOPY;  Surgeon: Ileana Roup, MD;  Location: [facility];  Service:  General;  Laterality: N/A;  . ILEOSTOMY N/A 08/08/2017   Procedure: Ladon Applebaum  ILEOSTOMY;  Surgeon: Ileana Roup, MD;  Location: [facility];  Service: General;  Laterality: N/A;  . LAPAROTOMY N/A 08/08/2017   Procedure: EXPLORATORY LAPAROTOMY;  Surgeon: Ileana Roup, MD;  Location: [facility];  Service: General;  Laterality: N/A;  . LYSIS OF ADHESION  08/08/2017   Procedure: LYSIS OF ADHESION;  Surgeon: Ileana Roup, MD;  Location: [facility];  Service: General;;  . TUBAL LIGATION      OB History    No data available       Home Medications    Prior to Admission medications   Medication Sig Start Date End Date Taking? Authorizing Provider  acetaminophen (TYLENOL 8 HOUR ARTHRITIS PAIN) 650 MG CR tablet Take 1,300 mg by mouth every 8 (eight) hours as needed for pain.   Yes [provider]  ferrous sulfate 325 (65 FE) MG tablet Take 1 tablet (325 mg total) by mouth 2 (two) times daily with a meal. 08/14/17  Yes Lavina Hamman, MD  gabapentin (NEURONTIN) 400 MG capsule Take 1 capsule (400 mg total) by mouth 3 (three) times daily. 08/14/17  Yes Lavina Hamman, MD  metoprolol tartrate (LOPRESSOR) 50 MG tablet Take 1 tablet (50 mg total) by mouth daily. 08/15/17  Yes Danine Hor Boston, MD  Multiple Vitamins-Minerals (ALIVE ONCE DAILY WOMENS 50+ PO) Take 1 each by mouth daily.   Yes [provider]  pantoprazole (PROTONIX) 40 MG tablet Take 1 tablet (40 mg total) by mouth daily at 12 noon. 08/14/17  Yes Lavina Hamman, MD  saccharomyces boulardii (FLORASTOR) 250 MG capsule Take 1 capsule (250 mg total) by mouth 2 (two) times daily. 08/14/17  Yes Lavina Hamman, MD  traMADol (ULTRAM) 50 MG tablet Take 1-2 tablets (50-100 mg total) by mouth every 6 (six) hours as needed for moderate pain or severe pain (pain not controlled with tylenol, gabapentin, ibuprofen). 08/15/17  Liston Alba, MD    Family History Family History  Problem Relation Age of Onset  . Heart disease  Maternal Grandmother   . Diabetes Maternal Grandmother   . Hypertension Maternal Grandmother   . Heart disease Maternal Grandfather   . Heart disease Paternal Grandmother   . Diabetes Paternal Grandmother   . Alzheimer's disease Paternal Grandmother     Social History Social History   Tobacco Use  . Smoking status: Former Research scientist (life sciences)  . Smokeless tobacco: Never Used  . Tobacco comment: quit smoking 30yrs ago  Substance Use Topics  . Alcohol use: No  . Drug use: No     Allergies   Hydrocodone; Nsaids; Penicillins; and Prednisone   Review of Systems Review of Systems  All other systems reviewed and are negative.    Physical Exam Updated Vital Signs BP 129/77 (BP Location: Right Arm)   Pulse 100   Temp 98.2 F (36.8 C) (Oral)   Resp 11   Ht 5\' 7"  (1.702 m)   Wt 59.9 kg (132 lb)   SpO2 98%   BMI 20.67 kg/m   Physical Exam  Constitutional: She appears well-developed and  well-nourished.  HENT:  Head: Normocephalic and atraumatic.  Right Ear: External ear normal.  Left Ear: External ear normal.  Nose: Nose normal.  Mouth/Throat: Uvula is midline, oropharynx is clear and moist and mucous membranes are normal. No tonsillar exudate.  Eyes: Pupils are equal, round, and reactive to light. Right eye exhibits no discharge. Left eye exhibits no discharge. No scleral icterus.  Neck: Trachea normal. Neck supple. No spinous process tenderness present. No neck rigidity. Normal range of motion present.  Cardiovascular: Normal rate, regular rhythm and intact distal pulses.  No murmur heard. Pulses:      Radial pulses are 2+ on the right side, and 2+ on the left side.       Dorsalis pedis pulses are 2+ on the right side, and 2+ on the left side.       Posterior tibial pulses are 2+ on the right side, and 2+ on the left side.  No lower extremity swelling or edema. Calves symmetric in size bilaterally.  Pulmonary/Chest: Effort normal and breath sounds normal. She exhibits no  tenderness.  Abdominal: Soft. Bowel sounds are normal. There is no tenderness. There is no rebound and no guarding.  Patient with midline incisional scar that is well healing.  No evidence of dehiscence of the incision.  There is no drainage.  No surrounding erythema or warmth.  Ileostomy tube with no drainage.  No tenderness palpation of the abdomen.  Musculoskeletal: She exhibits no edema.  Lymphadenopathy:    She has no cervical adenopathy.  Neurological: She is alert.  Skin: Skin is warm and dry. No rash noted. She is not diaphoretic.  Psychiatric: She has a normal mood and affect.  Nursing note and vitals reviewed.    ED Treatments / Results  Labs (all labs ordered are listed, but only abnormal results are displayed) Labs Reviewed  COMPREHENSIVE METABOLIC PANEL - Abnormal; Notable for the following components:      Result Value   Sodium 133 (*)    Chloride 96 (*)    Glucose, Bld 118 (*)    Albumin 3.4 (*)    AST 49 (*)    ALT 62 (*)    All other components within normal limits  CBC - Abnormal; Notable for the following components:   Hemoglobin 11.4 (*)    HCT 35.5 (*)    Platelets 602 (*)    All other components within normal limits  URINALYSIS, ROUTINE W REFLEX MICROSCOPIC - Abnormal; Notable for the following components:   Color, Urine AMBER (*)    APPearance HAZY (*)    Hgb urine dipstick LARGE (*)    Ketones, ur 20 (*)    Protein, ur 100 (*)    Nitrite POSITIVE (*)    Leukocytes, UA TRACE (*)    Bacteria, UA MANY (*)    Squamous Epithelial / LPF 0-5 (*)    All other components within normal limits  URINE CULTURE  LIPASE, BLOOD    EKG  EKG Interpretation None       Radiology No results found.  Procedures Procedures (including critical care time)  Medications Ordered in ED Medications  sodium chloride 0.9 % bolus 1,000 mL (not administered)  fentaNYL (SUBLIMAZE) injection 100 mcg (not administered)  ondansetron (ZOFRAN) injection 4 mg (not  administered)  aztreonam (AZACTAM) 1 g in sodium chloride 0.9 % 100 mL IVPB (not administered)     Initial Impression / Assessment and Plan / ED Course  I have reviewed the triage vital signs  and the nursing notes.  Pertinent labs & imaging results that were available during my care of the patient were reviewed by me and considered in my medical decision making (see chart for details).     57 y.o. female who presents to the emergency room today for 1 week history of urinary symptoms including urinary frequency, dysuria and urgency.  She notes that today she had the onset of nausea and one episode of nonbilious, nonbloody emesis.  Patient did recently have a reversal of a colostomy by Dr. Dema Severin in February.  She notes that this is been doing well.  She notes no change in drainage.  On exam there is no signs of superficial infection.  Patient is not complaining of the abdominal pain and abdominal exam is without tenderness palpation.  No peritoneal signs. Patient with urinary symptoms and urinalysis consistent with UTI with nitrate positive, trace leukocytes, TNTC wbc and many bacteria.  Question pyelonephritis given systemic symptoms.  Patient is not complaining of any flank pain or abdominal pain. No history of kidney stone.  Do not suspect kidney stone. Do not feel patient will benefit from CT scan at this time.  Will give IV fluids, nausea medication and IV antibiotics.  Urine culture sent.  Patient's labs without leukocytosis.  She is noted to have a elevated plt count of 602.  She will need repeat outpatient testing of this in 2 days. No AKI. No DKA. Lipase wnl. Patient tolerating PO fluids. No emesis in the department. Will discharge the patient home on Cipro. Patient has follow up with surgery tomorrow. She is to follow up with PCP in 2 days for repeat blood testing. Specific return precautions discussed. Time was given for all questions to be answered. The patient verbalized understanding and  agreement with plan. The patient appears safe for discharge home.  Final Clinical Impressions(s) / ED Diagnoses   Final diagnoses:  Pyelonephritis    ED Discharge Orders        Ordered    ciprofloxacin (CIPRO) 500 MG tablet  2 times daily     09/01/17 2315    ondansetron (ZOFRAN) 4 MG tablet  Every 8 hours PRN     09/01/17 2315       Lorelle Gibbs 09/01/17 2329    Macarthur Critchley, MD 09/07/17 [phone]

## 2017-09-04 ENCOUNTER — Ambulatory Visit: Payer: Medicaid Other | Admitting: Internal Medicine

## 2017-09-04 ENCOUNTER — Other Ambulatory Visit: Payer: Self-pay

## 2017-09-04 ENCOUNTER — Encounter: Payer: Self-pay | Admitting: Internal Medicine

## 2017-09-04 ENCOUNTER — Telehealth: Payer: Self-pay

## 2017-09-04 VITALS — BP 140/75 | HR 120 | Temp 98.4°F | Wt 130.2 lb

## 2017-09-04 DIAGNOSIS — N12 Tubulo-interstitial nephritis, not specified as acute or chronic: Secondary | ICD-10-CM

## 2017-09-04 DIAGNOSIS — I10 Essential (primary) hypertension: Secondary | ICD-10-CM

## 2017-09-04 DIAGNOSIS — R000 Tachycardia, unspecified: Secondary | ICD-10-CM

## 2017-09-04 LAB — URINE CULTURE: Culture: >=100000 — AB

## 2017-09-04 MED ORDER — SULFAMETHOXAZOLE-TRIMETHOPRIM 800-160 MG PO TABS: 1.0000 | ORAL_TABLET | Freq: Two times a day (BID) | ORAL | 0 refills | Status: DC

## 2017-09-04 MED ORDER — METOPROLOL TARTRATE 50 MG PO TABS: 100.0000 mg | ORAL_TABLET | Freq: Every day | ORAL | 0 refills | Status: DC

## 2017-09-04 NOTE — Telephone Encounter (Signed)
Wanda Baker with [facility] called requesting verbal orders:  Increase visit frequency to 2x/wk and add PRN visits for vital sign monitoring and medication education.  Call back is [phone].  Danley Danker, RN [facility] RN)

## 2017-09-04 NOTE — Progress Notes (Signed)
   [facility] Kerrin Mo, MD Phone: [phone]  Reason For Visit: SDA for ED follow up   #Patient was feeling crappy a couple of days ago.  Had an episode of nausea and vomiting.  Was also noting pain with urination.  Went to the ED on 3/18 where she was diagnosed with pyelonephritis.  Patient received a single dose of aztreonam, and was sent home with ciprofloxacin.  Patient states that overall she is feeling better.  She denies any pain with urination any frequency or urgency.  She denies any fevers or chills nausea or vomiting.  Past Medical History Reviewed problem list.  Medications- reviewed and updated No additions to family history Social history- patient is a non-smoker  Objective: BP 140/75 (BP Location: Right Arm, Patient Position: Sitting, Cuff Size: Normal)   Pulse (!) 120   Temp 98.4 F (36.9 C) (Oral)   Wt 130 lb 3.2 oz (59.1 kg)   SpO2 98%   BMI 20.39 kg/m  Gen: NAD, alert, cooperative with exam Cardio: regular rate and rhythm, S1S2 heard, no murmurs appreciated Pulm: clear to auscultation bilaterally, no wheezes, rhonchi or rales GI: soft, non-tender, non-distended, bowel sounds present, no hepatomegaly, no splenomegaly, negative for CVA tenderness   Assessment/Plan: See problem based a/p  Pyelonephritis Recently treated for pyelonephritis in the ED, urine culture positive for E. Coli, resistant to ciprofloxacin.  No systemic symptoms since seen in the ED.  No symptoms of dysuria, frequency or urgency -will switch patient to Bactrim for a total of 14 days -Follow-up as needed  Sinus tachycardia Notable for sinus tachycardia.  Has had this previously and was recently restarted on Lopressor by surgeon.  EKGs reviewed from recent analysis of tachycardia notable for sinus tachycardia.  Patient's rhythm was regular on examination -Will increase metoprolol 100 mg BID  -Follow-up in about 1 month

## 2017-09-04 NOTE — Patient Instructions (Signed)
Want you to start taking Bactrim for your UTI.  You need to take this for 14 days.  Follow-up if you are having any further symptoms.  Increase your Lopressor to 100 mg from 50 mg follow-up in about 1 month

## 2017-09-05 ENCOUNTER — Other Ambulatory Visit: Payer: Self-pay | Admitting: Internal Medicine

## 2017-09-05 ENCOUNTER — Telehealth: Payer: Self-pay | Admitting: *Deleted

## 2017-09-05 DIAGNOSIS — I10 Essential (primary) hypertension: Secondary | ICD-10-CM

## 2017-09-05 NOTE — Telephone Encounter (Signed)
Lisa informed. Deseree Kennon Holter, CMA

## 2017-09-05 NOTE — Progress Notes (Signed)
ED Antimicrobial Stewardship Positive Culture Follow Up   Wanda Baker is an 57 y.o. female who presented to [facility] on 09/01/2017 with a chief complaint of  Chief Complaint  Patient presents with  . Nausea    Recent Results (from the past 720 hour(s))  Urine culture     Status: Abnormal   Collection Time: 09/01/17  9:38 PM  Result Value Ref Range Status   Specimen Description   Final    URINE, RANDOM Performed at [facility] [address] [place], [place] [address]    Special Requests   Final    NONE Performed at [facility], [place] [address], [place], [place] [address]    Culture >=100,000 COLONIES/mL ESCHERICHIA COLI (A)  Final   Report Status 09/04/2017 FINAL  Final   Organism ID, Bacteria ESCHERICHIA COLI (A)  Final      Susceptibility   Escherichia coli - MIC*    AMPICILLIN >=32 RESISTANT Resistant     CEFAZOLIN <=4 SENSITIVE Sensitive     CEFTRIAXONE <=1 SENSITIVE Sensitive     CIPROFLOXACIN >=4 RESISTANT Resistant     GENTAMICIN <=1 SENSITIVE Sensitive     IMIPENEM <=0.25 SENSITIVE Sensitive     NITROFURANTOIN <=16 SENSITIVE Sensitive     TRIMETH/SULFA <=20 SENSITIVE Sensitive     AMPICILLIN/SULBACTAM >=32 RESISTANT Resistant     PIP/TAZO <=4 SENSITIVE Sensitive     Extended ESBL NEGATIVE Sensitive     * >=100,000 COLONIES/mL ESCHERICHIA COLI    [x]  Treated with Ciprofloxacin, organism resistant to prescribed antimicrobial []  Patient discharged originally without antimicrobial agent and treatment is now indicated  New antibiotic prescription: D/c Cipro and start Bactrim DS 1 tab po bid x 7 days  ED Provider: Wyn Quaker, PA-C  Lawson Radar 09/05/2017, 8:44 AM Infectious Diseases Pharmacist Phone# [phone]

## 2017-09-05 NOTE — Telephone Encounter (Signed)
Please provide verbal order.

## 2017-09-05 NOTE — Telephone Encounter (Signed)
Post ED Visit - Positive Culture Follow-up: Successful Patient Follow-Up  Culture assessed and recommendations reviewed by: []  Elenor Quinones, Pharm.D. []  Heide Guile, Pharm.D., BCPS AQ-ID []  Parks Neptune, Pharm.D., BCPS [x]  Alycia Rossetti, Pharm.D., BCPS []  [place], [place].D., BCPS, AAHIVP []  Legrand Como, Pharm.D., BCPS, AAHIVP []  Salome Arnt, PharmD, BCPS []  Dimitri Ped, PharmD, BCPS []  Vincenza Hews, PharmD, BCPS  Positive urine culture  []  Patient discharged without antimicrobial prescription and treatment is now indicated [x]  Organism is resistant to prescribed ED discharge antimicrobial []  Patient with positive blood cultures  Changes discussed with ED provider, Wyn Quaker, PA-C New antibiotic prescription Bactrim DS 1 tab BID x 7 days  Contacted patient, date 09/05/2017, time 0945.  Patient states she was seen by PCP on 09/04/2017 and antibiotic was changed to Bactrim DS.   Harlon Flor [facility] 09/05/2017, 9:45 AM

## 2017-09-07 ENCOUNTER — Encounter: Payer: Self-pay | Admitting: Internal Medicine

## 2017-09-07 NOTE — Assessment & Plan Note (Addendum)
Recently treated for pyelonephritis in the ED, urine culture positive for E. Coli, resistant to ciprofloxacin.  No systemic symptoms since seen in the ED.  No symptoms of dysuria, frequency or urgency -will switch patient to Bactrim for a total of 14 days -Follow-up as needed

## 2017-09-07 NOTE — Assessment & Plan Note (Signed)
Notable for sinus tachycardia.  Has had this previously and was recently restarted on Lopressor by surgeon.  EKGs reviewed from recent analysis of tachycardia notable for sinus tachycardia.  Patient's rhythm was regular on examination -Will increase metoprolol 100 mg BID  -Follow-up in about 1 month

## 2017-10-01 ENCOUNTER — Other Ambulatory Visit: Payer: Self-pay

## 2017-10-01 MED ORDER — GABAPENTIN 400 MG PO CAPS: 400.0000 mg | ORAL_CAPSULE | Freq: Three times a day (TID) | ORAL | 0 refills | Status: DC

## 2017-10-01 NOTE — Telephone Encounter (Signed)
Pt calling to check on status of refill request.

## 2017-10-06 ENCOUNTER — Ambulatory Visit: Payer: Medicaid Other | Admitting: Internal Medicine

## 2017-10-06 ENCOUNTER — Encounter: Payer: Self-pay | Admitting: Internal Medicine

## 2017-10-06 VITALS — BP 100/60 | HR 71 | Temp 98.1°F | Ht 67.0 in | Wt 132.4 lb

## 2017-10-06 DIAGNOSIS — I10 Essential (primary) hypertension: Secondary | ICD-10-CM

## 2017-10-06 NOTE — Patient Instructions (Signed)
Doing great. Follow up in June

## 2017-10-06 NOTE — Progress Notes (Signed)
   [facility] Kerrin Mo, MD Phone: [phone]  Reason For Visit: Follow up HTN  # CHRONIC HTN Current Meds - Lopressor 50 mg  Reports good compliance, took meds today. Tolerating well, w/o complaints. Denies CP, dyspnea, HA, edema, dizziness / lightheadedness  Past Medical History Reviewed problem list.  Medications- reviewed and updated No additions to family history Social history- patient is a non- smoker  Objective: BP 100/60 (BP Location: Left Arm, Patient Position: Sitting, Cuff Size: Normal)   Pulse 71   Temp 98.1 F (36.7 C) (Oral)   Ht 5\' 7"  (1.702 m)   Wt 132 lb 6.4 oz (60.1 kg)   SpO2 100%   BMI 20.74 kg/m  Gen: NAD, alert, cooperative with exam Cardio: regular rate and rhythm, S1S2 heard, no murmurs appreciated Pulm: clear to auscultation bilaterally, no wheezes, rhonchi or rales Skin: dry, intact, no rashes or lesions  Assessment/Plan: See problem based a/p  HYPERTENSION, BENIGN ESSENTIAL Currently on Lopressor 50 mg - this was chosen to help with blood pressure and sinus tachy  Follow up in 3 months for recheck of blood pressure

## 2017-10-06 NOTE — Assessment & Plan Note (Signed)
Currently on Lopressor 50 mg - this was chosen to help with blood pressure and sinus tachy  Follow up in 3 months for recheck of blood pressure

## 2017-11-03 ENCOUNTER — Other Ambulatory Visit: Payer: Self-pay | Admitting: Internal Medicine

## 2017-11-03 DIAGNOSIS — I10 Essential (primary) hypertension: Secondary | ICD-10-CM

## 2017-11-04 ENCOUNTER — Ambulatory Visit: Payer: Self-pay | Admitting: Surgery

## 2017-11-08 ENCOUNTER — Other Ambulatory Visit: Payer: Self-pay | Admitting: Surgery

## 2017-11-08 DIAGNOSIS — Z932 Ileostomy status: Secondary | ICD-10-CM

## 2017-11-11 ENCOUNTER — Inpatient Hospital Stay: Payer: Medicaid Other

## 2017-11-11 ENCOUNTER — Telehealth: Payer: Self-pay | Admitting: Oncology

## 2017-11-11 ENCOUNTER — Inpatient Hospital Stay: Payer: Medicaid Other | Attending: Oncology | Admitting: Oncology

## 2017-11-11 VITALS — BP 118/72 | HR 68 | Temp 98.0°F | Resp 17 | Ht 67.0 in | Wt 131.7 lb

## 2017-11-11 DIAGNOSIS — C20 Malignant neoplasm of rectum: Secondary | ICD-10-CM

## 2017-11-11 DIAGNOSIS — Z79899 Other long term (current) drug therapy: Secondary | ICD-10-CM | POA: Diagnosis not present

## 2017-11-11 DIAGNOSIS — D122 Benign neoplasm of ascending colon: Secondary | ICD-10-CM | POA: Diagnosis not present

## 2017-11-11 DIAGNOSIS — D509 Iron deficiency anemia, unspecified: Secondary | ICD-10-CM | POA: Diagnosis not present

## 2017-11-11 DIAGNOSIS — Z933 Colostomy status: Secondary | ICD-10-CM | POA: Diagnosis not present

## 2017-11-11 LAB — CEA (IN HOUSE-CHCC)

## 2017-11-11 NOTE — Telephone Encounter (Signed)
Appointments scheduled AVS/Calendar printed per 5/28 los °

## 2017-11-11 NOTE — Progress Notes (Signed)
  [facility] OFFICE PROGRESS NOTE   Diagnosis: Rectal cancer  INTERVAL HISTORY:   Wanda Baker returns for a scheduled visit.  She underwent colostomy takedown with creation of an ileostomy on 08/08/2017.  She reports feeling well.  Good appetite and energy level.  She is scheduled to undergo evaluation prior to ileostomy takedown.  Objective:  Vital signs in last 24 hours:  Blood pressure 118/72, pulse 68, temperature 98 F (36.7 C), temperature source Oral, resp. rate 17, height '5\' 7"'$  (1.702 m), weight 131 lb 11.2 oz (59.7 kg), SpO2 100 %.    HEENT: Neck without mass Lymphatics: No cervical, supraclavicular, axillary, or inguinal nodes Resp: Lungs clear bilaterally Cardio: Regular rate and rhythm GI: Right lower quadrant ileostomy, healed midline incision, no hepatomegaly, no mass Vascular: No leg edema  Lab Results:    Lab Results  Component Value Date   CEA1 1.46 05/13/2017     Medications: I have reviewed the patient's current medications.   Assessment/Plan: 1. 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 Tumor noted at 15 cm on sigmoidoscopy 09/17/2016 ? CT abdomen/pelvis 09/16/2016-colon obstruction, mass at the "distal sigmoid colon " ? Elevated preoperative CEA ? MSI-stable, no loss of mismatch repair protein expression ? 0/22 lymph nodes positive, extracellular mucin without tumor on the inked serosal surface ? Initiation of Xeloda/radiation 11/21/2016; completion of Xeloda/radiation 12/31/2016 ? Cycle 1 adjuvant Xeloda 01/27/2017 ? Cycle 2 adjuvant Xeloda 02/17/2017 ? Cycle 3 adjuvant Xeloda 03/10/2017 ? Cycle 4 adjuvant Xeloda 03/31/2017 (Xeloda dose escalated by one tablet daily) ? Cycle 5 adjuvant Xeloda 04/21/2017 ? Colonoscopy 04/18/2017-tubular adenoma removed from the ascending colon ? Colostomy takedown and creation  of an ileostomy 08/08/2017  2. History of a colonic obstruction secondary to #1  3. Iron deficiency anemia secondary to #1.  Resolved   Disposition: Wanda Baker is in clinical remission from rectal cancer.  We will follow-up on the CEA from today.  She will return for office visit and CEA in 6 months.  15 minutes were spent with the patient today.  The majority of the time was used for counseling and coordination of care.  [name], MD  11/11/2017  8:21 AM

## 2017-11-17 ENCOUNTER — Ambulatory Visit: Payer: Medicaid Other | Admitting: Internal Medicine

## 2017-11-17 ENCOUNTER — Encounter: Payer: Self-pay | Admitting: Internal Medicine

## 2017-11-17 ENCOUNTER — Other Ambulatory Visit: Payer: Self-pay | Admitting: Internal Medicine

## 2017-11-17 ENCOUNTER — Other Ambulatory Visit: Payer: Self-pay

## 2017-11-17 VITALS — BP 102/68 | HR 59 | Temp 97.9°F | Ht 67.0 in | Wt 133.4 lb

## 2017-11-17 DIAGNOSIS — K219 Gastro-esophageal reflux disease without esophagitis: Secondary | ICD-10-CM | POA: Diagnosis not present

## 2017-11-17 DIAGNOSIS — I10 Essential (primary) hypertension: Secondary | ICD-10-CM

## 2017-11-17 DIAGNOSIS — E119 Type 2 diabetes mellitus without complications: Secondary | ICD-10-CM | POA: Diagnosis present

## 2017-11-17 DIAGNOSIS — D638 Anemia in other chronic diseases classified elsewhere: Secondary | ICD-10-CM | POA: Diagnosis not present

## 2017-11-17 DIAGNOSIS — E785 Hyperlipidemia, unspecified: Secondary | ICD-10-CM | POA: Diagnosis not present

## 2017-11-17 LAB — POCT GLYCOSYLATED HEMOGLOBIN (HGB A1C): HbA1c, POC (controlled diabetic range): 5.1 % (ref 0.0–7.0)

## 2017-11-17 MED ORDER — FERROUS SULFATE 325 (65 FE) MG PO TABS: 325.0000 mg | ORAL_TABLET | Freq: Two times a day (BID) | ORAL | 0 refills | Status: DC

## 2017-11-17 MED ORDER — CETIRIZINE HCL 10 MG PO TABS: 10.0000 mg | ORAL_TABLET | Freq: Every day | ORAL | 4 refills | Status: AC

## 2017-11-17 MED ORDER — GABAPENTIN 400 MG PO CAPS: 400.0000 mg | ORAL_CAPSULE | Freq: Three times a day (TID) | ORAL | 2 refills | Status: DC

## 2017-11-17 MED ORDER — METOPROLOL TARTRATE 50 MG PO TABS: 100.0000 mg | ORAL_TABLET | Freq: Every day | ORAL | 3 refills | Status: DC

## 2017-11-17 MED ORDER — FAMOTIDINE 20 MG PO TABS: 20.0000 mg | ORAL_TABLET | Freq: Two times a day (BID) | ORAL | 0 refills | Status: DC

## 2017-11-17 NOTE — Assessment & Plan Note (Signed)
Doing well  Continue Lopressor

## 2017-11-17 NOTE — Patient Instructions (Signed)
I think you are doing great. I will check your blood to see if you need to continue the iron or not and we will let you know. Please follow up 2-3 months.

## 2017-11-17 NOTE — Assessment & Plan Note (Signed)
Discussed benefits versus risks of PPI  will place patient on Pepcid twice daily -see if this helps with her reflux symptoms, if not can restart PPI

## 2017-11-17 NOTE — Assessment & Plan Note (Signed)
History of iron deficiency anemia due to colorectal cancer -Will repeat CBC to see if she needs to continue taking iron

## 2017-11-17 NOTE — Progress Notes (Signed)
   [facility] Kerrin Mo, MD Phone: [phone]  Reason For Visit: Follow up   # GERD  -Patient was previously placed on Protonix during her hospital stay.  She has opted.  She states she is having reflux symptoms including burping and burning sensation in her throat.  She would like to restart a reflux medication.  # Anemia  -Patient has not been taking her iron pills lately.  She needs a refill on these medications.  Her last hemoglobin was low, given her diagnosis with colorectal cancer.  Overall she denies any dizziness.  She does indicate still having palpitations at times but is currently on Lopressor.   # CHRONIC HTN: Denies CP, dyspnea, HA, edema, dizziness / lightheadedness  Past Medical History Reviewed problem list.  Medications- reviewed and updated No additions to family history Social history- patient is a non- smoker  Objective: BP 102/68   Pulse (!) 59   Temp 97.9 F (36.6 C) (Oral)   Ht 5\' 7"  (1.702 m)   Wt 133 lb 6.4 oz (60.5 kg)   SpO2 98%   BMI 20.89 kg/m  Gen: NAD, alert, cooperative with exam Cardio: regular rate and rhythm, S1S2 heard, no murmurs appreciated Pulm: clear to auscultation bilaterally, no wheezes, rhonchi or rales Skin: dry, intact, no rashes or lesions   Assessment/Plan: See problem based a/p  Anemia of chronic disease History of iron deficiency anemia due to colorectal cancer -Will repeat CBC to see if she needs to continue taking iron  GERD (gastroesophageal reflux disease) Discussed benefits versus risks of PPI  will place patient on Pepcid twice daily -see if this helps with her reflux symptoms, if not can restart PPI   HYPERTENSION, BENIGN ESSENTIAL Doing well  Continue Lopressor

## 2017-11-18 ENCOUNTER — Ambulatory Visit
Admission: RE | Admit: 2017-11-18 | Discharge: 2017-11-18 | Disposition: A | Discharge status: Home / Self Care | Payer: Medicaid Other | Source: Ambulatory Visit | Attending: Surgery | Admitting: Surgery

## 2017-11-18 DIAGNOSIS — Z932 Ileostomy status: Secondary | ICD-10-CM

## 2017-11-18 LAB — CBC
Hematocrit: 40.6 % (ref 34.0–46.6)
Hemoglobin: 12.9 g/dL (ref 11.1–15.9)
MCH: 25.8 pg — ABNORMAL LOW (ref 26.6–33.0)
MCHC: 31.8 g/dL (ref 31.5–35.7)
MCV: 81 fL (ref 79–97)
Platelets: 280 10*3/uL (ref 150–450)
RBC: 5 x10E6/uL (ref 3.77–5.28)
RDW: 16.1 % — ABNORMAL HIGH (ref 12.3–15.4)
WBC: 6.5 10*3/uL (ref 3.4–10.8)

## 2017-11-19 ENCOUNTER — Encounter: Payer: Self-pay | Admitting: Internal Medicine

## 2017-11-21 ENCOUNTER — Other Ambulatory Visit: Payer: Self-pay

## 2017-11-21 ENCOUNTER — Encounter (HOSPITAL_COMMUNITY): Payer: Self-pay | Admitting: Emergency Medicine

## 2017-11-24 ENCOUNTER — Other Ambulatory Visit: Payer: Self-pay

## 2017-11-24 ENCOUNTER — Ambulatory Visit (HOSPITAL_COMMUNITY)
Admission: RE | Admit: 2017-11-24 | Discharge: 2017-11-24 | Disposition: A | Discharge status: Home / Self Care | Payer: Medicaid Other | Source: Ambulatory Visit | Attending: Surgery | Admitting: Surgery

## 2017-11-24 ENCOUNTER — Ambulatory Visit (HOSPITAL_COMMUNITY): Payer: Medicaid Other | Admitting: Anesthesiology

## 2017-11-24 ENCOUNTER — Encounter (HOSPITAL_COMMUNITY)
Admission: RE | Disposition: A | Discharge status: Home / Self Care | Payer: Self-pay | Source: Ambulatory Visit | Attending: Surgery

## 2017-11-24 ENCOUNTER — Encounter (HOSPITAL_COMMUNITY): Payer: Self-pay | Admitting: *Deleted

## 2017-11-24 DIAGNOSIS — Z885 Allergy status to narcotic agent status: Secondary | ICD-10-CM | POA: Insufficient documentation

## 2017-11-24 DIAGNOSIS — Z880 Allergy status to penicillin: Secondary | ICD-10-CM | POA: Insufficient documentation

## 2017-11-24 DIAGNOSIS — Z886 Allergy status to analgesic agent status: Secondary | ICD-10-CM | POA: Insufficient documentation

## 2017-11-24 DIAGNOSIS — E119 Type 2 diabetes mellitus without complications: Secondary | ICD-10-CM | POA: Insufficient documentation

## 2017-11-24 DIAGNOSIS — K219 Gastro-esophageal reflux disease without esophagitis: Secondary | ICD-10-CM | POA: Diagnosis not present

## 2017-11-24 DIAGNOSIS — Z8249 Family history of ischemic heart disease and other diseases of the circulatory system: Secondary | ICD-10-CM | POA: Insufficient documentation

## 2017-11-24 DIAGNOSIS — E785 Hyperlipidemia, unspecified: Secondary | ICD-10-CM | POA: Insufficient documentation

## 2017-11-24 DIAGNOSIS — I10 Essential (primary) hypertension: Secondary | ICD-10-CM | POA: Diagnosis not present

## 2017-11-24 DIAGNOSIS — Z87891 Personal history of nicotine dependence: Secondary | ICD-10-CM | POA: Insufficient documentation

## 2017-11-24 DIAGNOSIS — Z79899 Other long term (current) drug therapy: Secondary | ICD-10-CM | POA: Diagnosis not present

## 2017-11-24 DIAGNOSIS — M1990 Unspecified osteoarthritis, unspecified site: Secondary | ICD-10-CM | POA: Insufficient documentation

## 2017-11-24 DIAGNOSIS — Z90710 Acquired absence of both cervix and uterus: Secondary | ICD-10-CM | POA: Insufficient documentation

## 2017-11-24 DIAGNOSIS — Z09 Encounter for follow-up examination after completed treatment for conditions other than malignant neoplasm: Secondary | ICD-10-CM | POA: Insufficient documentation

## 2017-11-24 DIAGNOSIS — Z980 Intestinal bypass and anastomosis status: Secondary | ICD-10-CM | POA: Diagnosis not present

## 2017-11-24 DIAGNOSIS — Z7984 Long term (current) use of oral hypoglycemic drugs: Secondary | ICD-10-CM | POA: Diagnosis not present

## 2017-11-24 DIAGNOSIS — Z85048 Personal history of other malignant neoplasm of rectum, rectosigmoid junction, and anus: Secondary | ICD-10-CM | POA: Diagnosis not present

## 2017-11-24 HISTORY — PX: FLEXIBLE SIGMOIDOSCOPY: SHX5431

## 2017-11-24 HISTORY — PX: RECTAL EXAM UNDER ANESTHESIA: SHX6399

## 2017-11-24 SURGERY — SIGMOIDOSCOPY, FLEXIBLE
Anesthesia: General

## 2017-11-24 MED ORDER — SODIUM CHLORIDE 0.9 % IV SOLN: INTRAVENOUS | Status: DC

## 2017-11-24 MED ORDER — PROPOFOL 10 MG/ML IV BOLUS
INTRAVENOUS | Status: AC
  Filled 2017-11-24: qty 40

## 2017-11-24 MED ORDER — PROPOFOL 10 MG/ML IV BOLUS
INTRAVENOUS | Status: DC | PRN
  Administered 2017-11-24 (×3): 20 mg via INTRAVENOUS

## 2017-11-24 MED ORDER — LACTATED RINGERS IV SOLN
INTRAVENOUS | Status: DC
  Administered 2017-11-24: 09:00:00 via INTRAVENOUS

## 2017-11-24 MED ORDER — ONDANSETRON HCL 4 MG/2ML IJ SOLN
INTRAMUSCULAR | Status: DC | PRN
  Administered 2017-11-24: 4 mg via INTRAVENOUS

## 2017-11-24 MED ORDER — LIDOCAINE 2% (20 MG/ML) 5 ML SYRINGE
INTRAMUSCULAR | Status: DC | PRN
  Administered 2017-11-24: 100 mg via INTRAVENOUS

## 2017-11-24 MED ORDER — PROPOFOL 500 MG/50ML IV EMUL
INTRAVENOUS | Status: DC | PRN
  Administered 2017-11-24: 120 ug/kg/min via INTRAVENOUS

## 2017-11-24 NOTE — Op Note (Signed)
[facility] Patient Name: Skyeler Scalese Procedure Date: 11/24/2017 MRN: [id number] Attending MD: Ileana Roup MD, MD Date of Birth: 1961/05/18 CSN: [id number] Age: 57 Admit Type: Outpatient Procedure:                Flexible Sigmoidoscopy Indications:              Monitoring for anastomosis integrity Providers:                Sharon Mt. Nessa Ramaker MD, MD, Vista Lawman, RN,                            Elspeth Cho Tech., Technician, Danley Danker,                            CRNA Referring MD:              Medicines:                Monitored Anesthesia Care Complications:            No immediate complications. Estimated Blood Loss:     Estimated blood loss was minimal. Procedure:                Pre-Anesthesia Assessment:                           - ASA Grade Assessment: II - A patient with mild                            systemic disease.                           - After reviewing the risks and benefits, the                            patient was deemed in satisfactory condition to                            undergo the procedure.                           - The anesthesia plan was to use monitored                            anesthesia care (MAC).                           After obtaining informed consent, the scope was                            passed under direct vision. The EC-[id number] ([id number])                            scope was introduced through the anus and advanced                            to the the descending colon. The flexible  sigmoidoscopy was accomplished without difficulty.                            The patient tolerated the procedure well. The                            quality of the bowel preparation was adequate. Findings:      The perianal and digital rectal examinations were normal. Pertinent       negatives include no palpable rectal lesions.      A post-surgical anastomosis was found on digital exam. This  was found to       be patent.      There was evidence of a prior end-to-end colorectal anastomosis in the       mid rectum. This was patent and was characterized by healthy appearing       mucosa, an intact appearance and an intact staple line. The anastomosis       was traversed. Estimated blood loss was minimal.      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 Impression:               - Patent post-surgical anastomosis found on digital                            exam.                           - No specimens collected. Moderate Sedation:      N/A- Per Anesthesia Care Recommendation:           - Continue present medications. Procedure Code(s):        --- Professional ---                           [phone], Sigmoidoscopy, flexible; diagnostic,                            including collection of specimen(s) by brushing or                            washing, when performed (separate procedure) Diagnosis Code(s):        --- Professional ---                           Z09, Encounter for follow-up examination after                            completed treatment for conditions other than                            malignant neoplasm                           Z98.0, Intestinal bypass and anastomosis status CPT copyright 2017 American Medical Association. All rights reserved.  The codes documented in this report are preliminary and upon coder review may  be revised to meet current compliance requirements. Nadeen Landau, MD Ileana Roup MD, MD 11/24/2017 10:21:20 AM This report has been signed electronically. Number of Addenda: 0

## 2017-11-24 NOTE — Discharge Instructions (Signed)
Flexible Sigmoidoscopy, Care After This sheet gives you information about how to care for yourself after your procedure. Your health care provider may also give you more specific instructions. If you have problems or questions, contact your health care provider. What can I expect after the procedure? After the procedure, it is common to have:  Abdominal cramping or pain.  Bloating.  A small amount of rectal bleeding if you had a biopsy.  Follow these instructions at home:  Take over-the-counter and prescription medicines only as told by your health care provider.  Do not drive for 24 hours if you received a medicine to help you relax (sedative).  Keep all follow-up visits as told by your health care provider. This is important. Contact a health care provider if:  You have abdominal pain or cramping that gets worse or is not helped with medicine.  You continue to have small amounts of rectal bleeding after 24 hours.  You have nausea or vomiting.  You feel weak or dizzy.  You have a fever. Get help right away if:  You pass large blood clots or see a large amount of blood in the toilet after having a bowel movement.  You have nausea or vomiting for more than 24 hours after the procedure. This information is not intended to replace advice given to you by your health care provider. Make sure you discuss any questions you have with your health care provider. Document Released: 06/08/2013 Document Revised: 12/22/2015 Document Reviewed: 09/02/2015 Elsevier Interactive Patient Education  2018 [place], Care After These instructions provide you with information about caring for yourself after your procedure. Your health care provider may also give you more specific instructions. Your treatment has been planned according to current medical practices, but problems sometimes occur. Call your health care provider if you have any problems or questions  after your procedure. What can I expect after the procedure? After your procedure, it is common to:  Feel sleepy for several hours.  Feel clumsy and have poor balance for several hours.  Feel forgetful about what happened after the procedure.  Have poor judgment for several hours.  Feel nauseous or vomit.  Have a sore throat if you had a breathing tube during the procedure.  Follow these instructions at home: For at least 24 hours after the procedure:   Do not: ? Participate in activities in which you could fall or become injured. ? Drive. ? Use heavy machinery. ? Drink alcohol. ? Take sleeping pills or medicines that cause drowsiness. ? Make important decisions or sign legal documents. ? Take care of children on your own.  Rest. Eating and drinking  Follow the diet that is recommended by your health care provider.  If you vomit, drink water, juice, or soup when you can drink without vomiting.  Make sure you have little or no nausea before eating solid foods. General instructions  Have a responsible adult stay with you until you are awake and alert.  Take over-the-counter and prescription medicines only as told by your health care provider.  If you smoke, do not smoke without supervision.  Keep all follow-up visits as told by your health care provider. This is important. Contact a health care provider if:  You keep feeling nauseous or you keep vomiting.  You feel light-headed.  You develop a rash.  You have a fever. Get help right away if:  You have trouble breathing. This information is not intended to replace  advice given to you by your health care provider. Make sure you discuss any questions you have with your health care provider. Document Released: 09/24/2015 Document Revised: 01/24/2016 Document Reviewed: 09/24/2015 Elsevier Interactive Patient Education  [employer].

## 2017-11-24 NOTE — Anesthesia Preprocedure Evaluation (Signed)
Anesthesia Evaluation  Patient identified by MRN, date of birth, ID band Patient awake    Reviewed: Allergy & Precautions, NPO status , Patient's Chart, lab work & pertinent test results  Airway Mallampati: III  TM Distance: <3 FB Neck ROM: Full    Dental  (+) Teeth Intact, Dental Advisory Given   Pulmonary former smoker,    breath sounds clear to auscultation       Cardiovascular hypertension, Pt. on medications  Rhythm:Regular Rate:Normal     Neuro/Psych  Neuromuscular disease negative psych ROS   GI/Hepatic Neg liver ROS, GERD  Medicated and Controlled,  Endo/Other  diabetes, Type 2, Oral Hypoglycemic Agents  Renal/GU negative Renal ROS     Musculoskeletal  (+) Arthritis ,   Abdominal (+) + obese,   Peds  Hematology   Anesthesia Other Findings   Reproductive/Obstetrics                             Lab Results  Component Value Date   WBC 6.5 11/17/2017   HGB 12.9 11/17/2017   HCT 40.6 11/17/2017   MCV 81 11/17/2017   PLT 280 11/17/2017   Lab Results  Component Value Date   CREATININE 0.64 09/01/2017   BUN 12 09/01/2017   NA 133 (L) 09/01/2017   K 4.2 09/01/2017   CL 96 (L) 09/01/2017   CO2 24 09/01/2017   Lab Results  Component Value Date   INR 0.96 08/04/2017   EKG: sinus tachycardia.  Anesthesia Physical  Anesthesia Plan  ASA: II  Anesthesia Plan: General   Post-op Pain Management:    Induction: Intravenous  PONV Risk Score and Plan: 4 or greater and Ondansetron  Airway Management Planned: Nasal Cannula, Natural Airway and Mask  Additional Equipment: None  Intra-op Plan:   Post-operative Plan:   Informed Consent:   Dental advisory given  Plan Discussed with: CRNA, Anesthesiologist and Surgeon  Anesthesia Plan Comments:         Anesthesia Quick Evaluation

## 2017-11-24 NOTE — Anesthesia Postprocedure Evaluation (Signed)
Anesthesia Post Note  Patient: [facility]anda [facility]  Procedure(s) Performed: FLEXIBLE SIGMOIDOSCOPY (N/A ) RECTAL EXAM UNDER ANESTHESIA     Patient location during evaluation: PACU Anesthesia Type: MAC Level of consciousness: awake and alert Pain management: pain level controlled Vital Signs Assessment: post-procedure vital signs reviewed and stable Respiratory status: spontaneous breathing, nonlabored ventilation, respiratory function stable and patient connected to nasal cannula oxygen Cardiovascular status: stable and blood pressure returned to baseline Postop Assessment: no apparent nausea or vomiting Anesthetic complications: no    Last Vitals:  Vitals:   11/24/17 1025 11/24/17 1030  BP: 118/75 131/75  Pulse:    Resp: 17 19  Temp:    SpO2: 100% 100%    Last Pain:  Vitals:   11/24/17 1030  TempSrc:   PainSc: 0-No pain                 Myrissa Chipley

## 2017-11-24 NOTE — Anesthesia Procedure Notes (Signed)
Date/Time: 11/24/2017 10:00 AM Performed by: Sharlette Dense, CRNA Oxygen Delivery Method: Simple face mask

## 2017-11-24 NOTE — Anesthesia Postprocedure Evaluation (Deleted)
Anesthesia Post Note  Patient: Wanda [facility]  Procedure(s) Performed: FLEXIBLE SIGMOIDOSCOPY (N/A ) RECTAL EXAM UNDER ANESTHESIA     Patient location during evaluation: PACU Anesthesia Type: General Level of consciousness: awake and alert Pain management: pain level controlled Vital Signs Assessment: post-procedure vital signs reviewed and stable Respiratory status: spontaneous breathing, nonlabored ventilation, respiratory function stable and patient connected to nasal cannula oxygen Cardiovascular status: blood pressure returned to baseline and stable Postop Assessment: no apparent nausea or vomiting Anesthetic complications: no    Last Vitals:  Vitals:   11/24/17 1025 11/24/17 1030  BP: 118/75 131/75  Pulse:    Resp: 17 19  Temp:    SpO2: 100% 100%    Last Pain:  Vitals:   11/24/17 1030  TempSrc:   PainSc: 0-No pain                 Antione Obar

## 2017-11-24 NOTE — Transfer of Care (Signed)
Immediate Anesthesia Transfer of Care Note  Patient: Wanda [facility]  Procedure(s) Performed: FLEXIBLE SIGMOIDOSCOPY (N/A ) RECTAL EXAM UNDER ANESTHESIA  Patient Location: Endoscopy Unit  Anesthesia Type:MAC  Level of Consciousness: awake, alert  and oriented  Airway & Oxygen Therapy: Patient Spontanous Breathing  Post-op Assessment: Report given to RN and Post -op Vital signs reviewed and stable  Post vital signs: Reviewed and stable  Last Vitals:  Vitals Value Taken Time  BP    Temp    Pulse 76 11/24/2017 10:15 AM  Resp 19 11/24/2017 10:15 AM  SpO2 100 % 11/24/2017 10:15 AM  Vitals shown include unvalidated device data.  Last Pain:  Vitals:   11/24/17 0915  TempSrc: Oral  PainSc: 0-No pain         Complications: No apparent anesthesia complications

## 2017-11-24 NOTE — H&P (Signed)
CC: Here for flex sig  HPI: 30yoF hx of obstructing rectal ca s/p Hartmann's (Dr. Dalbert Batman), adjuvant chemoXRT and then most recently Hartmann's reversal by myself with exlap, 97min LOA, splenic flexure takedown, colostomy takedown with double stapled colorectal anastomosis, small bowel resection with enteroenterostomy, diverting ileostomy, flex sig on 08/08/17. Postoperatively she had an expected ileus but ultimately recovered and was discharged 08/15/17. she is seen back in the office in March with ostomy output controlled on 3 times a day Imodium as well as Metamucil. She is here for f/u  Initially had some issues with her stoma appliance not fitting appropriately and having some skin irritation - has since resolved  Denies any complaints today. She denies any problems with her stoma or appliance. She is tolerating a diet, weight stable, ostomy output controlled on when necessary Imodium. She is still taking Metamucil.  GGE 11/18/17 Satisfactory appearance of the rectosigmoid anastomosis and large bowel. No contrast leak or adverse features identified.  Past Medical History:  Diagnosis Date  . Adenocarcinoma of colon ([place]) 09/23/2016  . Allergy    uses Nasocort daily and takes Singulair nightly   . Arthritis   . Chronic back pain    buldging disc  . Diabetes mellitus without complication (HCC)    taikes Metformin daily  . GERD (gastroesophageal reflux disease)    takes Omeprazole daily   . History of bronchitis    many yrs ago  . Hyperlipidemia    takes Atorvastatin daily  . Hypertension    takes Lisinopril daily as well HCTZ  . 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 Weakness    left arm    Past Surgical History:  Procedure Laterality Date  . ABDOMINAL HYSTERECTOMY  2011   Supracervical Hysterectomy  . ANTERIOR CERVICAL DECOMP/DISCECTOMY FUSION N/A 12/02/2013   Procedure: ANTERIOR CERVICAL DECOMPRESSION/DISCECTOMY FUSION 3 LEVELS;   Surgeon: Erline Levine, MD;  Location: [place] [facility];  Service: Neurosurgery;  Laterality: N/A;  C4-5 C5-6 C6-7 Anterior cervical decompression/diskectomy/fusion  . BOWEL RESECTION N/A 09/18/2016   Procedure: LOW ANTERIOR COLON RESECTION WITH COLOSTOMY;  Surgeon: Fanny Skates, MD;  Location: [facility];  Service: General;  Laterality: N/A;  . BOWEL RESECTION  08/08/2017   Procedure: SMALL BOWEL RESECTION x 2;  Surgeon: Ileana Roup, MD;  Location: [facility];  Service: General;;  . CESAREAN SECTION  86/88  . COLOSTOMY TAKEDOWN N/A 08/08/2017   Procedure: HARTMANNS TAKEDOWN OF COLOSTOMY WITH COLORECTAL ANASTOMOIS ERAS PATHWAY;  Surgeon: Ileana Roup, MD;  Location: [facility];  Service: General;  Laterality: N/A;  . CYSTOSCOPY W/ URETERAL STENT PLACEMENT N/A 08/08/2017   Procedure: CYSTOSCOPY WITH URETERAL CATHETERS;  Surgeon: Ardis Hughs, MD;  Location: [facility];  Service: Urology;  Laterality: N/A;  . FLEXIBLE SIGMOIDOSCOPY N/A 09/17/2016   Procedure: FLEXIBLE SIGMOIDOSCOPY;  Surgeon: Mauri Pole, MD;  Location: [facility];  Service: Endoscopy;  Laterality: N/A;  . FLEXIBLE SIGMOIDOSCOPY N/A 08/08/2017   Procedure: FLEXIBLE SIGMOIDOSCOPY;  Surgeon: Ileana Roup, MD;  Location: [facility];  Service: General;  Laterality: N/A;  . ILEOSTOMY N/A 08/08/2017   Procedure: Ladon Applebaum  ILEOSTOMY;  Surgeon: Ileana Roup, MD;  Location: [facility];  Service: General;  Laterality: N/A;  . LAPAROTOMY N/A 08/08/2017   Procedure: EXPLORATORY LAPAROTOMY;  Surgeon: Ileana Roup, MD;  Location: [facility];  Service: General;  Laterality: N/A;  . LYSIS OF ADHESION  08/08/2017   Procedure: LYSIS OF ADHESION;  Surgeon: Dema Severin,  Sharon Mt, MD;  Location: [facility];  Service: General;;  . TUBAL LIGATION      Family History  Problem Relation Age of Onset  . Heart disease Maternal Grandmother   . Diabetes Maternal Grandmother   . Hypertension Maternal Grandmother   . Heart disease Maternal  Grandfather   . Heart disease Paternal Grandmother   . Diabetes Paternal Grandmother   . Alzheimer's disease Paternal Grandmother     Social:  reports that she has quit smoking. She has never used smokeless tobacco. She reports that she does not drink alcohol or use drugs.  Allergies:  Allergies  Allergen Reactions  . Hydrocodone Other (See Comments)    Palpitations / dizziness  . Nsaids Other (See Comments)    GERD "tears my stomach up"  . Penicillins Swelling, Rash and Other (See Comments)    Has patient had a PCN reaction causing immediate rash, facial/tongue/throat swelling, SOB or lightheadedness with hypotension: Yes Has patient had a PCN reaction causing severe rash involving mucus membranes or skin necrosis: No Has patient had a PCN reaction that required hospitalization No Has patient had a PCN reaction occurring within the last 10 years: No If all of the above answers are "NO", then may proceed with Cephalosporin use.   . Prednisone Rash    Medications: I have reviewed the patient's current medications.  No results found for this or any previous visit (from the past 48 hour(s)).  No results found.  ROS - all of the below systems have been reviewed with the patient and positives are indicated with bold text General: chills, fever or night sweats Eyes: blurry vision or double vision ENT: epistaxis or sore throat Allergy/Immunology: itchy/watery eyes or nasal congestion Hematologic/Lymphatic: bleeding problems, blood clots or swollen lymph nodes Endocrine: temperature intolerance or unexpected weight changes Breast: new or changing breast lumps or nipple discharge Resp: cough, shortness of breath, or wheezing CV: chest pain or dyspnea on exertion GI: as per HPI GU: dysuria, trouble voiding, or hematuria MSK: joint pain or joint stiffness Neuro: TIA or stroke symptoms Derm: pruritus and skin lesion changes Psych: anxiety and depression  PE Blood pressure (!)  144/86, pulse 68, temperature 97.9 F (36.6 C), temperature source Oral, resp. rate 18, height 5\' 7"  (1.702 m), weight 60.5 kg (133 lb 6.4 oz), SpO2 100 %. Constitutional: NAD; conversant; no deformities Eyes: Moist conjunctiva; no lid lag; anicteric; PERRL Neck: Trachea midline; no thyromegaly Lungs: Normal respiratory effort; no tactile fremitus CV: RRR; no palpable thrills; no pitting edema GI: Abd soft, NT/ND, stoma appliance in place; no palpable hepatosplenomegaly MSK: Normal gait; no clubbing/cyanosis Psychiatric: Appropriate affect; alert and oriented x3 Lymphatic: No palpable cervical or axillary lymphadenopathy   A/P: [name] is a very pleasant 39yoF with hx of obstructing proximal rectal cancer status post Hartman's procedure for/2018 by Dr. Dalbert Batman. She then underwent adjuvant chemoradiation which she completed 05/04/17 and tolerated well. Now s/p exlap/LOA/Hartmann's takedown/SBR/ileostomy (which is technically an end-loop - distal end in wound but disconnected from proximal)  -GGE clear -Plan flexible sigmoidoscopy to assess her colorectal anastomosis today -She is due for a complete colonoscopy 04/2018 for surveillance -The procedure, flexible sigmoidoscopy-was discussed with the patient length. We discussed the technical aspects of the procedure. We discussed material risks( including, but not limited to, bleeding, perforation, need for additional procedures, damage to surrounding structures) benefits and alternatives. Her questions were answered to her satisfaction. She voiced understanding. She elected to proceed  Sharon Mt. Dema Severin, M.D. [facility]

## 2017-11-25 ENCOUNTER — Encounter (HOSPITAL_COMMUNITY): Payer: Self-pay | Admitting: Surgery

## 2017-12-17 ENCOUNTER — Other Ambulatory Visit: Payer: Self-pay | Admitting: Family Medicine

## 2017-12-17 ENCOUNTER — Other Ambulatory Visit: Payer: Self-pay | Admitting: Internal Medicine

## 2017-12-17 DIAGNOSIS — K219 Gastro-esophageal reflux disease without esophagitis: Secondary | ICD-10-CM

## 2017-12-24 DIAGNOSIS — Z933 Colostomy status: Secondary | ICD-10-CM | POA: Diagnosis not present

## 2018-01-15 ENCOUNTER — Encounter: Payer: Self-pay | Admitting: Family Medicine

## 2018-01-15 ENCOUNTER — Ambulatory Visit: Payer: Medicaid Other | Admitting: Family Medicine

## 2018-01-15 VITALS — BP 100/65 | HR 61 | Temp 98.0°F | Ht 67.0 in | Wt 134.8 lb

## 2018-01-15 DIAGNOSIS — Z7689 Persons encountering health services in other specified circumstances: Secondary | ICD-10-CM

## 2018-01-15 DIAGNOSIS — C20 Malignant neoplasm of rectum: Secondary | ICD-10-CM | POA: Diagnosis not present

## 2018-01-15 DIAGNOSIS — Z0000 Encounter for general adult medical examination without abnormal findings: Secondary | ICD-10-CM | POA: Diagnosis not present

## 2018-01-15 MED ORDER — GABAPENTIN 400 MG PO CAPS: 400.0000 mg | ORAL_CAPSULE | Freq: Three times a day (TID) | ORAL | 2 refills | Status: DC

## 2018-01-15 NOTE — Patient Instructions (Addendum)
It was great meeting you! I am glad that things have been going well with regards to your cancer treatment and surgeries. I refilled your gabapentin today. In regards to the shingles vaccine, it is recommended for adults above 58 years of age.  When you decide you are ready to get it, I will give you a prescription and you can get it at the pharmacy. I went over some of the potential side effects you can expect after getting it. I am glad that everything looks very good with your health right now. Please let me know if there is anything I can do.

## 2018-01-18 ENCOUNTER — Encounter: Payer: Self-pay | Admitting: Family Medicine

## 2018-01-18 DIAGNOSIS — Z0000 Encounter for general adult medical examination without abnormal findings: Secondary | ICD-10-CM | POA: Insufficient documentation

## 2018-01-18 NOTE — Assessment & Plan Note (Signed)
Patient s/p hartmann's. Planned re-anastamosis of colon in near future. Patient to follow up with me after this procedure done.

## 2018-01-18 NOTE — Progress Notes (Signed)
   HPI 57 year old female who presents to meet her new PCP. From chart review patient with rectal cancer. S/P hartmann's pouch. Now with one surgery left to re-anastamose her colon. She is having no issues today. Refilled gabapentin. Discussed singles vaccine.  CC: meet new pcp   ROS:   Review of Systems See HPI for ROS.   CC, SH/smoking status, and VS noted  Objective: BP 100/65 (BP Location: Right Arm, Patient Position: Sitting, Cuff Size: Normal)   Pulse 61   Temp 98 F (36.7 C) (Oral)   Ht 5\' 7"  (1.702 m)   Wt 134 lb 12.8 oz (61.1 kg)   SpO2 99%   BMI 21.11 kg/m  Gen: NAD, alert, cooperative, and pleasant. HEENT: NCAT, EOMI, PERRL CV: RRR, no murmur Resp: CTAB, no wheezes, non-labored Abd: SNTND, BS present, no guarding or organomegaly. RLQ ostomy bag noted. Brown, liquid stool noted in bag Ext: No edema, warm Neuro: Alert and oriented, Speech clear, No gross deficits   Assessment and plan:  Healthcare maintenance Discussed shingles vaccine. Patient to decide when she wants to get it, due to the known flu-like symptoms for 1-2 days. Will send in script to her pharamacy when she lets me know she is ready.  Rectal cancer [facility]) Patient s/p hartmann's. Planned re-anastamosis of colon in near future. Patient to follow up with me after this procedure done.   No orders of the defined types were placed in this encounter.   Meds ordered this encounter  Medications  . gabapentin (NEURONTIN) 400 MG capsule    Sig: Take 1 capsule (400 mg total) by mouth 3 (three) times daily.    Dispense:  120 capsule    Refill:  2     [name] MD PGY-2 Family Medicine Resident  01/18/2018 1:24 PM

## 2018-01-18 NOTE — Assessment & Plan Note (Signed)
Discussed shingles vaccine. Patient to decide when she wants to get it, due to the known flu-like symptoms for 1-2 days. Will send in script to her pharamacy when she lets me know she is ready.

## 2018-01-20 ENCOUNTER — Ambulatory Visit: Payer: Self-pay | Admitting: Surgery

## 2018-01-20 ENCOUNTER — Other Ambulatory Visit: Payer: Self-pay | Admitting: Pediatrics

## 2018-01-20 ENCOUNTER — Other Ambulatory Visit: Payer: Self-pay | Admitting: Family Medicine

## 2018-01-20 DIAGNOSIS — Z1231 Encounter for screening mammogram for malignant neoplasm of breast: Secondary | ICD-10-CM

## 2018-01-20 DIAGNOSIS — C20 Malignant neoplasm of rectum: Secondary | ICD-10-CM | POA: Diagnosis not present

## 2018-01-20 NOTE — H&P (Signed)
CC: Here today for f/u  57yoF hx of obstructing rectal ca s/p Hartmann's (Dr. Dalbert Batman), adjuvant chemoXRT and then most recently Hartmann's reversal by myself with exlap, 57min LOA, splenic flexure takedown, colostomy takedown with double stapled colorectal anastomosis, small bowel resection with enteroenterostomy, diverting ileostomy, flex sig on 08/08/17. Postoperatively she had an expected ileus but ultimately recovered and was discharged 08/15/17. she is seen back in the office in March with ostomy output controlled on 3 times a day Imodium as well as Metamucil. She is here for f/u regarding surgery for stoma reversal.  Initially had some issues with her stoma appliance not fitting appropriately and having some skin irritation - has since resolved  Denies any complaints today. She denies any problems with her stoma or appliance. She is tolerating a diet, weight stable, ostomy output controlled on when necessary Imodium. She is still taking Metamucil.  She underwent GGE 11/18/17 which showed satisfactory appearance of the colorectal anastomosis. No contrast leak or adverse features. She underwent flexible sigmoidoscopy by myself 11/24/17 which showed a normal-appearing end-to-end, patent colorectal anastomosis in the mid rectum that was 6-7 cm in the anal verge and palpable on exam.  The patient is a 57 year old female.   Allergies Alean Rinne, [place]; 01/20/2018 10:37 AM) Vicodin *ANALGESICS - OPIOID*  PredniSONE *CORTICOSTEROIDS*  Penicillins  Allergies Reconciled   Medication History Alean Rinne, RMA; 01/20/2018 10:38 AM) Metoprolol Tartrate (50MG  Tablet, Oral) Active. Gabapentin (400MG  Capsule, Oral) Active. Pantoprazole Sodium (40MG  Tablet DR, Oral) Active. Xeloda (500MG  Tablet, Oral) Active. Gabapentin (300MG  Capsule, Oral) Active. Singulair (10MG  Tablet, Oral) Active. PriLOSEC (20MG  Capsule DR, Oral) Active. TraMADol HCl (50MG  Tablet, Oral) Active. Florastor (250MG   Capsule, Oral) Active. Probiotic (Oral) Active. Ferrous Sulfate (325 (65 Fe)MG Tablet, Oral) Active. Famotidine (20MG  Tablet, Oral) Active. Medications Reconciled    Review of Systems Harrell Gave M. Mickey Esguerra MD; 01/20/2018 10:59 AM) General Not Present- Chills and Fever. Skin Not Present- Bruising and Coarse Hair. Respiratory Not Present- Decreased Exercise Tolerance and Difficulty Breathing. Cardiovascular Not Present- Chest Pain and Difficulty Breathing On Exertion. Gastrointestinal Not Present- Abdominal Pain, Nausea and Vomiting. Musculoskeletal Not Present- Arm Weakness and Leg Cramps. Neurological Not Present- Decreased Memory and Difficulty Speaking. Psychiatric Not Present- Anxiety and Depression. Endocrine Not Present- Appetite Changes and Cold Intolerance. Hematology Not Present- Blood Clots and Blood Thinners.  Vitals Mardene Celeste King RMA; 01/20/2018 10:37 AM) 01/20/2018 10:37 AM Weight: 136.2 lb Height: 67in Body Surface Area: 1.72 m Body Mass Index: 21.33 kg/m  Temp.: 98.72F  Pulse: 98 (Regular)  BP: 130/72 (Sitting, Left Arm, Standard)       Physical Exam Harrell Gave M. Miklo Aken MD; 01/20/2018 11:00 AM) The physical exam findings are as follows: Note:Constitutional: No acute distress; conversant; no deformities Eyes: Moist conjunctiva; no lid lag; anicteric sclerae; pupils equal round and reactive to light Neck: Trachea midline; no palpable thyromegaly Lungs: Normal respiratory effort; no tactile fremitus CV: Regular rate and rhythm; no palpable thrill; no pitting edema GI: Abdomen soft, nontender, nondistended; no palpable hepatosplenomegaly. Ileostomy pink with stool in appliance. MSK: Normal gait; no clubbing/cyanosis Psychiatric: Appropriate affect; alert and oriented 3 Lymphatic: No palpable cervical or axillary lymphadenopathy    Assessment & Plan Harrell Gave M. Ryhanna Dunsmore MD; 01/20/2018 11:03 AM) RE[facility]CER ([facility]) Story: [name] is a very  pleasant 10yoF with hx of obstructing proximal rectal cancer status post Hartman's procedure 09/2016 by Dr. Dalbert Batman. She then underwent adjuvant chemoradiation which she completed 05/04/17 and tolerated well. Now s/p exlap/LOA/Hartmann's takedown/SBR/ileostomy (which is  technically an end-loop - distal end in wound but disconnected from proximal) 08/08/17 -Flex sig clear; GGE normal in appearance without leak/sinus -She is due for a complete colonoscopy 04/2018 for surveillance Impression: -The anatomy and physiology of the GI tract was discussed at length with the patient. The pathophysiology of ileostomy/ileostomy reversal was discussed at length with associated pictures. -We discussed at length ileostomy reversal surgery. We discussed the potential issues that we may encounter with her surgery given that she has 2 separate ends of small bowel brought up. We discussed peristomal incision, possible laparoscopic approaches as well as open techniques. We discussed that which approach we'll gently use will be dictated by intraoperative findings -The planned procedure, material risks (including, but not limited to, pain, bleeding, infection, scarring, need for blood transfusion, damage to surrounding structures- blood vessels/nerves/viscus/organs, damage to ureter, urine leak, leak from small bowel anastomosis, need for additional procedures, need for stoma which may be permanent, hernia, recurrence, pneumonia, heart attack, stroke, death) benefits and alternatives to surgery were discussed at length. The patient's questions were answered to her satisfaction, she voiced understanding and elected to proceed with surgery. Additionally, we discussed typical postoperative expectations and the recovery process.  Signed electronically by Ileana Roup, MD (01/20/2018 11:04 AM)

## 2018-01-23 DIAGNOSIS — Z933 Colostomy status: Secondary | ICD-10-CM | POA: Diagnosis not present

## 2018-01-30 ENCOUNTER — Other Ambulatory Visit: Payer: Self-pay | Admitting: Family Medicine

## 2018-01-30 DIAGNOSIS — K219 Gastro-esophageal reflux disease without esophagitis: Secondary | ICD-10-CM

## 2018-02-17 ENCOUNTER — Ambulatory Visit (INDEPENDENT_AMBULATORY_CARE_PROVIDER_SITE_OTHER): Payer: Medicaid Other

## 2018-02-17 DIAGNOSIS — Z23 Encounter for immunization: Secondary | ICD-10-CM | POA: Diagnosis not present

## 2018-03-13 ENCOUNTER — Encounter (HOSPITAL_COMMUNITY): Payer: Self-pay

## 2018-03-13 NOTE — Patient Instructions (Addendum)
Wanda Baker  03/13/2018   Your procedure is scheduled on: 03-27-18  Report to [facility] Main  Entrance  Report to admitting at 530  AM    Call this number if you have problems the morning of surgery [phone]   Remember: Do not eat food :After Midnight. BRUSH YOUR TEETH MORNING OF SURGERY AND RINSE YOUR MOUTH OUT, NO CHEWING GUM CANDY OR MINTS.  DRINK 2 PRESURGERY ENSURE DRINKS THE NIGHT BEFORE SURGERY AT  1000 PM AND 1 PRESURGERY DRINK THE DAY OF THE PROCEDURE 3 HOURS PRIOR TO SCHEDULED SURGERY. NO SOLIDS AFTER MIDNIGHT THE DAY PRIOR TO THE SURGERY. NOTHING BY MOUTH EXCEPT CLEAR LIQUIDS UNTIL THREE HOURS PRIOR TO SCHEDULED SURGERY. PLEASE FINISH PRESURGERY ENSURE DRINK PER SURGEON ORDER 3 HOURS PRIOR TO SCHEDULED SURGERY TIME WHICH NEEDS TO BE COMPLETED AT 430 AM.  CALL DR WHITE AND GET BOWEL PREP INSTRUCTIONS PHONE [phone] CLEAR LIQUID DIET   Foods Allowed                                                                     Foods Excluded  Coffee and tea, regular and decaf                             liquids that you cannot  Plain Jell-O in any flavor                                             see through such as: Fruit ices (not with fruit pulp)                                     milk, soups, orange juice  Iced Popsicles                                    All solid food Carbonated beverages, regular and diet                                    Cranberry, grape and apple juices Sports drinks like Gatorade Lightly seasoned clear broth or consume(fat free) Sugar, honey syrup  Sample Menu Breakfast                                Lunch                                     Supper Cranberry juice                    Beef broth  Chicken broth Jell-O                                     Grape juice                           Apple juice Coffee or tea                        Jell-O                                      Popsicle                                                 Coffee or tea                        Coffee or tea  _____________________________________________________________________     Take these medicines the morning of surgery with A SIP OF WATER: GABAPENTIN (NEURONTIN), METOPROLOL TARTRATE, FAMOTIDINE (PEPCID), TYLENOL ARTHRITIS                                You may not have any metal on your body including hair pins and              piercings  Do not wear jewelry, make-up, lotions, powders or perfumes, deodorant             Do not wear nail polish.  Do not shave  48 hours prior to surgery.              Men may shave face and neck.   Do not bring valuables to the hospital. [place].  Contacts, dentures or bridgework may not be worn into surgery.  Leave suitcase in the car. After surgery it may be brought to your room.               Please read over the following fact sheets you were given: _____________________________________________________________________   [facility] - Preparing for Surgery Before surgery, you can play an important role.  Because skin is not sterile, your skin needs to be as free of germs as possible.  You can reduce the number of germs on your skin by washing with CHG (chlorahexidine gluconate) soap before surgery.  CHG is an antiseptic cleaner which kills germs and bonds with the skin to continue killing germs even after washing. Please DO NOT use if you have an allergy to CHG or antibacterial soaps.  If your skin becomes reddened/irritated stop using the CHG and inform your nurse when you arrive at Short Stay. Do not shave (including legs and underarms) for at least 48 hours prior to the first CHG shower.  You may shave your face/neck. Please follow these instructions carefully:  1.  Shower with CHG Soap the night before surgery and the  morning of Surgery.  2.  If you choose to wash your hair, wash your hair  first as usual with  your  normal  shampoo.  3.  After you shampoo, rinse your hair and body thoroughly to remove the  shampoo.                           4.  Use CHG as you would any other liquid soap.  You can apply chg directly  to the skin and wash                       Gently with a scrungie or clean washcloth.  5.  Apply the CHG Soap to your body ONLY FROM THE NECK DOWN.   Do not use on face/ open                           Wound or open sores. Avoid contact with eyes, ears mouth and genitals (private parts).                       Wash face,  Genitals (private parts) with your normal soap.             6.  Wash thoroughly, paying special attention to the area where your surgery  will be performed.  7.  Thoroughly rinse your body with warm water from the neck down.  8.  DO NOT shower/wash with your normal soap after using and rinsing off  the CHG Soap.                9.  Pat yourself dry with a clean towel.            10.  Wear clean pajamas.            11.  Place clean sheets on your bed the night of your first shower and do not  sleep with pets. Day of Surgery : Do not apply any lotions/deodorants the morning of surgery.  Please wear clean clothes to the hospital/surgery center.  FAILURE TO FOLLOW THESE INSTRUCTIONS MAY RESULT IN THE CANCELLATION OF YOUR SURGERY PATIENT SIGNATURE_________________________________  NURSE SIGNATURE__________________________________  ________________________________________________________________________   Wanda Baker  An incentive spirometer is a tool that can help keep your lungs clear and active. This tool measures how well you are filling your lungs with each breath. Taking long deep breaths may help reverse or decrease the chance of developing breathing (pulmonary) problems (especially infection) following:  A long period of time when you are unable to move or be active. BEFORE THE PROCEDURE   If the spirometer includes an indicator to show your best effort,  your nurse or respiratory therapist will set it to a desired goal.  If possible, sit up straight or lean slightly forward. Try not to slouch.  Hold the incentive spirometer in an upright position. INSTRUCTIONS FOR USE  1. Sit on the edge of your bed if possible, or sit up as far as you can in bed or on a chair. 2. Hold the incentive spirometer in an upright position. 3. Breathe out normally. 4. Place the mouthpiece in your mouth and seal your lips tightly around it. 5. Breathe in slowly and as deeply as possible, raising the piston or the ball toward the top of the column. 6. Hold your breath for 3-5 seconds or for as long as possible. Allow the piston or ball to fall to the bottom of the column. 7. Remove  the mouthpiece from your mouth and breathe out normally. 8. Rest for a few seconds and repeat Steps 1 through 7 at least 10 times every 1-2 hours when you are awake. Take your time and take a few normal breaths between deep breaths. 9. The spirometer may include an indicator to show your best effort. Use the indicator as a goal to work toward during each repetition. 10. After each set of 10 deep breaths, practice coughing to be sure your lungs are clear. If you have an incision (the cut made at the time of surgery), support your incision when coughing by placing a pillow or rolled up towels firmly against it. Once you are able to get out of bed, walk around indoors and cough well. You may stop using the incentive spirometer when instructed by your caregiver.  RISKS AND COMPLICATIONS  Take your time so you do not get dizzy or light-headed.  If you are in pain, you may need to take or ask for pain medication before doing incentive spirometry. It is harder to take a deep breath if you are having pain. AFTER USE  Rest and breathe slowly and easily.  It can be helpful to keep track of a log of your progress. Your caregiver can provide you with a simple table to help with this. If you are  using the spirometer at home, follow these instructions: [place] IF:   You are having difficultly using the spirometer.  You have trouble using the spirometer as often as instructed.  Your pain medication is not giving enough relief while using the spirometer.  You develop fever of 100.5 F (38.1 C) or higher. SEEK IMMEDIATE MEDICAL CARE IF:   You cough up bloody sputum that had not been present before.  You develop fever of 102 F (38.9 C) or greater.  You develop worsening pain at or near the incision site. MAKE SURE YOU:   Understand these instructions.  Will watch your condition.  Will get help right away if you are not doing well or get worse. Document Released: 10/14/2006 Document Revised: 08/26/2011 Document Reviewed: 12/15/2006 [facility] Patient Information 2014 [place], [place].   ________________________________________________________________________

## 2018-03-17 ENCOUNTER — Encounter (HOSPITAL_COMMUNITY)
Admission: RE | Admit: 2018-03-17 | Discharge: 2018-03-17 | Disposition: A | Discharge status: Home / Self Care | Payer: Medicaid Other | Source: Ambulatory Visit | Attending: Surgery | Admitting: Surgery

## 2018-03-17 ENCOUNTER — Other Ambulatory Visit: Payer: Self-pay

## 2018-03-17 ENCOUNTER — Encounter (HOSPITAL_COMMUNITY): Payer: Self-pay

## 2018-03-17 DIAGNOSIS — Z01818 Encounter for other preprocedural examination: Secondary | ICD-10-CM | POA: Diagnosis not present

## 2018-03-17 HISTORY — DX: Unspecified osteoarthritis, unspecified site: M19.90

## 2018-03-17 LAB — COMPREHENSIVE METABOLIC PANEL
ALT: 27 U/L (ref 0–44)
AST: 32 U/L (ref 15–41)
Albumin: 4.1 g/dL (ref 3.5–5.0)
Alkaline Phosphatase: 73 U/L (ref 38–126)
Anion gap: 8 (ref 5–15)
BUN: 10 mg/dL (ref 6–20)
CHLORIDE: 107 mmol/L (ref 98–111)
CO2: 27 mmol/L (ref 22–32)
Calcium: 9.8 mg/dL (ref 8.9–10.3)
Creatinine, Ser: 0.69 mg/dL (ref 0.44–1.00)
GFR calc Af Amer: >60 mL/min (ref >60)
Glucose, Bld: 93 mg/dL (ref 70–99)
POTASSIUM: 4.6 mmol/L (ref 3.5–5.1)
Sodium: 142 mmol/L (ref 135–145)
Total Bilirubin: 0.9 mg/dL (ref 0.3–1.2)
Total Protein: 7.3 g/dL (ref 6.5–8.1)

## 2018-03-17 LAB — CBC WITH DIFFERENTIAL/PLATELET
BASOS ABS: 0 10*3/uL (ref 0.0–0.1)
BASOS PCT: 0 %
EOS ABS: 0.1 10*3/uL (ref 0.0–0.7)
EOS PCT: 2 %
HCT: 41.9 % (ref 36.0–46.0)
Hemoglobin: 14.1 g/dL (ref 12.0–15.0)
LYMPHS PCT: 25 %
Lymphs Abs: 1.5 10*3/uL (ref 0.7–4.0)
MCH: 29.1 pg (ref 26.0–34.0)
MCHC: 33.7 g/dL (ref 30.0–36.0)
MCV: 86.6 fL (ref 78.0–100.0)
MONO ABS: 0.4 10*3/uL (ref 0.1–1.0)
Monocytes Relative: 7 %
Neutro Abs: 4.1 10*3/uL (ref 1.7–7.7)
Neutrophils Relative %: 66 %
PLATELETS: 263 10*3/uL (ref 150–400)
RBC: 4.84 MIL/uL (ref 3.87–5.11)
RDW: 13 % (ref 11.5–15.5)
WBC: 6.2 10*3/uL (ref 4.0–10.5)

## 2018-03-17 LAB — GLUCOSE, CAPILLARY: GLUCOSE-CAPILLARY: 84 mg/dL (ref 70–99)

## 2018-03-17 NOTE — Progress Notes (Signed)
Chart left with misty moore rn to follow up with hemaglobin a1c result

## 2018-03-17 NOTE — Progress Notes (Signed)
SPOKE WITH DR [facility] ANESTHESIA AND MADE WARE 08-10-17 CHEST CT RESULTS, ANESTHESIA TO ASSESS AM OF SURGERY IF PATIENT NEEDS CHEST XRAY.

## 2018-03-18 LAB — HEMOGLOBIN A1C
Hgb A1c MFr Bld: 5.5 % (ref 4.8–5.6)
Mean Plasma Glucose: 111 mg/dL

## 2018-03-26 MED ORDER — GENTAMICIN SULFATE 40 MG/ML IJ SOLN
320.0000 mg | INTRAVENOUS | Status: AC
  Administered 2018-03-27: 10 mg via INTRAVENOUS
  Filled 2018-03-26: qty 8

## 2018-03-26 MED ORDER — CLINDAMYCIN PHOSPHATE 900 MG/50ML IV SOLN
900.0000 mg | INTRAVENOUS | Status: AC
  Administered 2018-03-27: 900 mg via INTRAVENOUS
  Filled 2018-03-26: qty 50

## 2018-03-26 MED ORDER — BUPIVACAINE LIPOSOME 1.3 % IJ SUSP
20.0000 mL | INTRAMUSCULAR | Status: DC
  Filled 2018-03-26: qty 20

## 2018-03-26 NOTE — Anesthesia Preprocedure Evaluation (Addendum)
Anesthesia Evaluation  Patient identified by MRN, date of birth, ID band Patient awake    Reviewed: Allergy & Precautions, H&P , NPO status , Patient's Chart, lab work & pertinent test results  Airway Mallampati: II  TM Distance: >3 FB Neck ROM: Full    Dental no notable dental hx. (+) Teeth Intact, Dental Advisory Given   Pulmonary neg pulmonary ROS, former smoker,    Pulmonary exam normal breath sounds clear to auscultation       Cardiovascular hypertension, Pt. on medications and Pt. on home beta blockers Normal cardiovascular exam Rhythm:Regular Rate:Normal     Neuro/Psych negative neurological ROS  negative psych ROS   GI/Hepatic Neg liver ROS, GERD  Medicated,  Endo/Other  diabetes  Renal/GU negative Renal ROS     Musculoskeletal  (+) Arthritis ,   Abdominal   Peds  Hematology   Anesthesia Other Findings   Reproductive/Obstetrics                           Lab Results  Component Value Date   WBC 6.2 03/17/2018   HGB 14.1 03/17/2018   HCT 41.9 03/17/2018   MCV 86.6 03/17/2018   PLT 263 03/17/2018   Lab Results  Component Value Date   CREATININE 0.69 03/17/2018   BUN 10 03/17/2018   NA 142 03/17/2018   K 4.6 03/17/2018   CL 107 03/17/2018   CO2 27 03/17/2018      Anesthesia Physical Anesthesia Plan  ASA: III  Anesthesia Plan: General   Post-op Pain Management:    Induction: Intravenous  PONV Risk Score and Plan: 4 or greater and Treatment may vary due to age or medical condition, Ondansetron, Scopolamine patch - Pre-op and Dexamethasone  Airway Management Planned: Oral ETT  Additional Equipment:   Intra-op Plan:   Post-operative Plan:   Informed Consent: I have reviewed the patients History and Physical, chart, labs and discussed the procedure including the risks, benefits and alternatives for the proposed anesthesia with the patient or authorized  representative who has indicated his/her understanding and acceptance.   Dental advisory given  Plan Discussed with: CRNA  Anesthesia Plan Comments: (Lidocaine, Ketamine Eras)       Anesthesia Quick Evaluation

## 2018-03-27 ENCOUNTER — Encounter (HOSPITAL_COMMUNITY)
Admission: RE | Disposition: A | Discharge status: Home / Self Care | Payer: Self-pay | Source: Ambulatory Visit | Attending: Surgery

## 2018-03-27 ENCOUNTER — Encounter (HOSPITAL_COMMUNITY): Payer: Self-pay | Admitting: Certified Registered Nurse Anesthetist

## 2018-03-27 ENCOUNTER — Inpatient Hospital Stay (HOSPITAL_COMMUNITY): Payer: Medicaid Other | Admitting: Anesthesiology

## 2018-03-27 ENCOUNTER — Other Ambulatory Visit: Payer: Self-pay

## 2018-03-27 ENCOUNTER — Inpatient Hospital Stay (HOSPITAL_COMMUNITY)
Admission: RE | Admit: 2018-03-27 | Discharge: 2018-04-01 | DRG: 331 | Disposition: A | Discharge status: Home / Self Care | Payer: Medicaid Other | Source: Ambulatory Visit | Attending: Surgery | Admitting: Surgery

## 2018-03-27 DIAGNOSIS — Z981 Arthrodesis status: Secondary | ICD-10-CM | POA: Diagnosis not present

## 2018-03-27 DIAGNOSIS — R000 Tachycardia, unspecified: Secondary | ICD-10-CM | POA: Diagnosis not present

## 2018-03-27 DIAGNOSIS — Z888 Allergy status to other drugs, medicaments and biological substances status: Secondary | ICD-10-CM

## 2018-03-27 DIAGNOSIS — E119 Type 2 diabetes mellitus without complications: Secondary | ICD-10-CM | POA: Diagnosis not present

## 2018-03-27 DIAGNOSIS — Z880 Allergy status to penicillin: Secondary | ICD-10-CM

## 2018-03-27 DIAGNOSIS — E785 Hyperlipidemia, unspecified: Secondary | ICD-10-CM | POA: Diagnosis present

## 2018-03-27 DIAGNOSIS — Z9221 Personal history of antineoplastic chemotherapy: Secondary | ICD-10-CM

## 2018-03-27 DIAGNOSIS — I10 Essential (primary) hypertension: Secondary | ICD-10-CM | POA: Diagnosis present

## 2018-03-27 DIAGNOSIS — Z885 Allergy status to narcotic agent status: Secondary | ICD-10-CM | POA: Diagnosis not present

## 2018-03-27 DIAGNOSIS — Z932 Ileostomy status: Secondary | ICD-10-CM

## 2018-03-27 DIAGNOSIS — Z85048 Personal history of other malignant neoplasm of rectum, rectosigmoid junction, and anus: Secondary | ICD-10-CM | POA: Diagnosis not present

## 2018-03-27 DIAGNOSIS — Z923 Personal history of irradiation: Secondary | ICD-10-CM | POA: Diagnosis not present

## 2018-03-27 DIAGNOSIS — Z87891 Personal history of nicotine dependence: Secondary | ICD-10-CM | POA: Diagnosis not present

## 2018-03-27 DIAGNOSIS — K660 Peritoneal adhesions (postprocedural) (postinfection): Secondary | ICD-10-CM | POA: Diagnosis not present

## 2018-03-27 DIAGNOSIS — C20 Malignant neoplasm of rectum: Secondary | ICD-10-CM | POA: Diagnosis not present

## 2018-03-27 DIAGNOSIS — Z432 Encounter for attention to ileostomy: Principal | ICD-10-CM

## 2018-03-27 HISTORY — PX: LAPAROSCOPY: SHX197

## 2018-03-27 LAB — TYPE AND SCREEN
ABO/RH(D): O POS
ANTIBODY SCREEN: NEGATIVE

## 2018-03-27 LAB — CBC
HEMATOCRIT: 40.5 % (ref 36.0–46.0)
Hemoglobin: 13.4 g/dL (ref 12.0–15.0)
MCH: 28.8 pg (ref 26.0–34.0)
MCHC: 33.1 g/dL (ref 30.0–36.0)
MCV: 86.9 fL (ref 80.0–100.0)
PLATELETS: 290 10*3/uL (ref 150–400)
RBC: 4.66 MIL/uL (ref 3.87–5.11)
RDW: 12.8 % (ref 11.5–15.5)
WBC: 12.3 10*3/uL — AB (ref 4.0–10.5)
nRBC: 0 % (ref 0.0–0.2)

## 2018-03-27 LAB — CREATININE, SERUM
Creatinine, Ser: 0.69 mg/dL (ref 0.44–1.00)
GFR calc Af Amer: >60 mL/min (ref >60)

## 2018-03-27 LAB — GLUCOSE, CAPILLARY
GLUCOSE-CAPILLARY: 93 mg/dL (ref 70–99)
Glucose-Capillary: 143 mg/dL — ABNORMAL HIGH (ref 70–99)

## 2018-03-27 SURGERY — LAPAROSCOPY, DIAGNOSTIC
Anesthesia: General | Site: Abdomen

## 2018-03-27 MED ORDER — ONDANSETRON HCL 4 MG PO TABS: 4.0000 mg | ORAL_TABLET | Freq: Four times a day (QID) | ORAL | Status: DC | PRN

## 2018-03-27 MED ORDER — LIDOCAINE HCL 2 % IJ SOLN
INTRAMUSCULAR | Status: AC
  Filled 2018-03-27: qty 20

## 2018-03-27 MED ORDER — PROPOFOL 10 MG/ML IV BOLUS
INTRAVENOUS | Status: DC | PRN
  Administered 2018-03-27: 180 mg via INTRAVENOUS

## 2018-03-27 MED ORDER — KETAMINE HCL 10 MG/ML IJ SOLN
INTRAMUSCULAR | Status: DC | PRN
  Administered 2018-03-27 (×2): 10 mg via INTRAVENOUS
  Administered 2018-03-27: 20 mg via INTRAVENOUS
  Administered 2018-03-27: 10 mg via INTRAVENOUS

## 2018-03-27 MED ORDER — PHENYLEPHRINE 40 MCG/ML (10ML) SYRINGE FOR IV PUSH (FOR BLOOD PRESSURE SUPPORT)
PREFILLED_SYRINGE | INTRAVENOUS | Status: DC | PRN
  Administered 2018-03-27 (×6): 80 ug via INTRAVENOUS

## 2018-03-27 MED ORDER — SODIUM CHLORIDE 0.9 % IJ SOLN
INTRAMUSCULAR | Status: DC | PRN
  Administered 2018-03-27: 15 mL

## 2018-03-27 MED ORDER — LACTATED RINGERS IV SOLN
INTRAVENOUS | Status: DC
  Administered 2018-03-27 – 2018-03-28 (×3): via INTRAVENOUS

## 2018-03-27 MED ORDER — HYDROCODONE-ACETAMINOPHEN 7.5-325 MG PO TABS: 1.0000 | ORAL_TABLET | Freq: Once | ORAL | Status: DC | PRN

## 2018-03-27 MED ORDER — TRAMADOL HCL 50 MG PO TABS: 50.0000 mg | ORAL_TABLET | Freq: Four times a day (QID) | ORAL | Status: DC | PRN

## 2018-03-27 MED ORDER — METOPROLOL TARTRATE 50 MG PO TABS
100.0000 mg | ORAL_TABLET | Freq: Every day | ORAL | Status: DC
  Administered 2018-03-28 – 2018-04-01 (×5): 100 mg via ORAL
  Filled 2018-03-27 (×5): qty 2

## 2018-03-27 MED ORDER — ROCURONIUM BROMIDE 100 MG/10ML IV SOLN
INTRAVENOUS | Status: AC
  Filled 2018-03-27: qty 1

## 2018-03-27 MED ORDER — LIDOCAINE 2% (20 MG/ML) 5 ML SYRINGE
INTRAMUSCULAR | Status: AC
  Filled 2018-03-27: qty 5

## 2018-03-27 MED ORDER — BUPIVACAINE-EPINEPHRINE (PF) 0.5% -1:200000 IJ SOLN
INTRAMUSCULAR | Status: AC
  Filled 2018-03-27: qty 30

## 2018-03-27 MED ORDER — ALUM & MAG HYDROXIDE-SIMETH 200-200-20 MG/5ML PO SUSP: 30.0000 mL | Freq: Four times a day (QID) | ORAL | Status: DC | PRN

## 2018-03-27 MED ORDER — BUPIVACAINE-EPINEPHRINE 0.5% -1:200000 IJ SOLN
INTRAMUSCULAR | Status: DC | PRN
  Administered 2018-03-27: 15 mL

## 2018-03-27 MED ORDER — ENSURE SURGERY PO LIQD
237.0000 mL | Freq: Two times a day (BID) | ORAL | Status: DC
  Filled 2018-03-27 (×11): qty 237

## 2018-03-27 MED ORDER — CHLORHEXIDINE GLUCONATE CLOTH 2 % EX PADS
6.0000 | MEDICATED_PAD | Freq: Once | CUTANEOUS | Status: AC
  Administered 2018-03-27: 6 via TOPICAL

## 2018-03-27 MED ORDER — ONDANSETRON HCL 4 MG/2ML IJ SOLN: 4.0000 mg | Freq: Once | INTRAMUSCULAR | Status: DC | PRN

## 2018-03-27 MED ORDER — DEXAMETHASONE SODIUM PHOSPHATE 10 MG/ML IJ SOLN
INTRAMUSCULAR | Status: DC | PRN
  Administered 2018-03-27: 10 mg via INTRAVENOUS

## 2018-03-27 MED ORDER — FENTANYL CITRATE (PF) 100 MCG/2ML IJ SOLN: 25.0000 ug | INTRAMUSCULAR | Status: DC | PRN

## 2018-03-27 MED ORDER — DIPHENHYDRAMINE HCL 12.5 MG/5ML PO ELIX: 12.5000 mg | ORAL_SOLUTION | Freq: Four times a day (QID) | ORAL | Status: DC | PRN

## 2018-03-27 MED ORDER — PROPOFOL 10 MG/ML IV BOLUS
INTRAVENOUS | Status: AC
  Filled 2018-03-27: qty 20

## 2018-03-27 MED ORDER — FERROUS SULFATE 325 (65 FE) MG PO TABS
325.0000 mg | ORAL_TABLET | Freq: Two times a day (BID) | ORAL | Status: DC
  Administered 2018-03-28 – 2018-04-01 (×9): 325 mg via ORAL
  Filled 2018-03-27 (×9): qty 1

## 2018-03-27 MED ORDER — FENTANYL CITRATE (PF) 250 MCG/5ML IJ SOLN
INTRAMUSCULAR | Status: DC | PRN
  Administered 2018-03-27 (×3): 50 ug via INTRAVENOUS
  Administered 2018-03-27: 100 ug via INTRAVENOUS

## 2018-03-27 MED ORDER — ACETAMINOPHEN 500 MG PO TABS
1000.0000 mg | ORAL_TABLET | Freq: Four times a day (QID) | ORAL | Status: DC
  Administered 2018-03-28 – 2018-04-01 (×14): 1000 mg via ORAL
  Filled 2018-03-27 (×18): qty 2

## 2018-03-27 MED ORDER — BUPIVACAINE LIPOSOME 1.3 % IJ SUSP
INTRAMUSCULAR | Status: DC | PRN
  Administered 2018-03-27: 20 mL

## 2018-03-27 MED ORDER — PHENYLEPHRINE HCL 10 MG/ML IJ SOLN
INTRAMUSCULAR | Status: AC
  Filled 2018-03-27: qty 2

## 2018-03-27 MED ORDER — LIDOCAINE 2% (20 MG/ML) 5 ML SYRINGE
INTRAMUSCULAR | Status: DC | PRN
  Administered 2018-03-27: 1.5 mg/kg/h via INTRAVENOUS

## 2018-03-27 MED ORDER — ALVIMOPAN 12 MG PO CAPS
12.0000 mg | ORAL_CAPSULE | Freq: Two times a day (BID) | ORAL | Status: DC
  Administered 2018-03-28 (×2): 12 mg via ORAL
  Filled 2018-03-27 (×2): qty 1

## 2018-03-27 MED ORDER — LACTATED RINGERS IR SOLN
Status: DC | PRN
  Administered 2018-03-27: 1000 mL

## 2018-03-27 MED ORDER — ONDANSETRON HCL 4 MG/2ML IJ SOLN
INTRAMUSCULAR | Status: DC | PRN
  Administered 2018-03-27: 4 mg via INTRAVENOUS

## 2018-03-27 MED ORDER — DEXAMETHASONE SODIUM PHOSPHATE 10 MG/ML IJ SOLN
INTRAMUSCULAR | Status: AC
  Filled 2018-03-27: qty 1

## 2018-03-27 MED ORDER — HYDROMORPHONE HCL 1 MG/ML IJ SOLN
0.5000 mg | INTRAMUSCULAR | Status: DC | PRN
  Administered 2018-03-27 – 2018-03-28 (×5): 0.5 mg via INTRAVENOUS
  Filled 2018-03-27 (×5): qty 0.5

## 2018-03-27 MED ORDER — KETAMINE HCL 10 MG/ML IJ SOLN
INTRAMUSCULAR | Status: AC
  Filled 2018-03-27: qty 1

## 2018-03-27 MED ORDER — ONDANSETRON HCL 4 MG/2ML IJ SOLN
INTRAMUSCULAR | Status: AC
  Filled 2018-03-27: qty 2

## 2018-03-27 MED ORDER — FENTANYL CITRATE (PF) 250 MCG/5ML IJ SOLN
INTRAMUSCULAR | Status: AC
  Filled 2018-03-27: qty 5

## 2018-03-27 MED ORDER — FAMOTIDINE 20 MG PO TABS
20.0000 mg | ORAL_TABLET | Freq: Every day | ORAL | Status: DC
  Administered 2018-03-28 – 2018-04-01 (×5): 20 mg via ORAL
  Filled 2018-03-27 (×5): qty 1

## 2018-03-27 MED ORDER — LIDOCAINE 2% (20 MG/ML) 5 ML SYRINGE
INTRAMUSCULAR | Status: DC | PRN
  Administered 2018-03-27: 60 mg via INTRAVENOUS

## 2018-03-27 MED ORDER — KETOROLAC TROMETHAMINE 30 MG/ML IJ SOLN: 30.0000 mg | Freq: Once | INTRAMUSCULAR | Status: DC | PRN

## 2018-03-27 MED ORDER — 0.9 % SODIUM CHLORIDE (POUR BTL) OPTIME
TOPICAL | Status: DC | PRN
  Administered 2018-03-27: 2000 mL

## 2018-03-27 MED ORDER — MEPERIDINE HCL 50 MG/ML IJ SOLN: 6.2500 mg | INTRAMUSCULAR | Status: DC | PRN

## 2018-03-27 MED ORDER — HYDRALAZINE HCL 20 MG/ML IJ SOLN: 10.0000 mg | INTRAMUSCULAR | Status: DC | PRN

## 2018-03-27 MED ORDER — GABAPENTIN 300 MG PO CAPS
300.0000 mg | ORAL_CAPSULE | ORAL | Status: AC
  Administered 2018-03-27: 300 mg via ORAL
  Filled 2018-03-27: qty 1

## 2018-03-27 MED ORDER — SODIUM CHLORIDE 0.9 % IV SOLN
INTRAVENOUS | Status: DC | PRN
  Administered 2018-03-27: 50 ug/min via INTRAVENOUS

## 2018-03-27 MED ORDER — ACETAMINOPHEN 500 MG PO TABS
1000.0000 mg | ORAL_TABLET | ORAL | Status: AC
  Administered 2018-03-27: 1000 mg via ORAL
  Filled 2018-03-27: qty 2

## 2018-03-27 MED ORDER — MIDAZOLAM HCL 5 MG/5ML IJ SOLN
INTRAMUSCULAR | Status: DC | PRN
  Administered 2018-03-27: 2 mg via INTRAVENOUS

## 2018-03-27 MED ORDER — DIPHENHYDRAMINE HCL 50 MG/ML IJ SOLN: 12.5000 mg | Freq: Four times a day (QID) | INTRAMUSCULAR | Status: DC | PRN

## 2018-03-27 MED ORDER — ONDANSETRON HCL 4 MG/2ML IJ SOLN
4.0000 mg | Freq: Four times a day (QID) | INTRAMUSCULAR | Status: DC | PRN
  Administered 2018-03-27: 4 mg via INTRAVENOUS
  Filled 2018-03-27: qty 2

## 2018-03-27 MED ORDER — LACTATED RINGERS IV SOLN
INTRAVENOUS | Status: DC
  Administered 2018-03-27 (×2): via INTRAVENOUS

## 2018-03-27 MED ORDER — HEPARIN SODIUM (PORCINE) 5000 UNIT/ML IJ SOLN
5000.0000 [IU] | Freq: Once | INTRAMUSCULAR | Status: AC
  Administered 2018-03-27: 5000 [IU] via SUBCUTANEOUS
  Filled 2018-03-27: qty 1

## 2018-03-27 MED ORDER — SUGAMMADEX SODIUM 200 MG/2ML IV SOLN
INTRAVENOUS | Status: DC | PRN
  Administered 2018-03-27: 250 mg via INTRAVENOUS

## 2018-03-27 MED ORDER — MIDAZOLAM HCL 2 MG/2ML IJ SOLN
INTRAMUSCULAR | Status: AC
  Filled 2018-03-27: qty 2

## 2018-03-27 MED ORDER — SODIUM CHLORIDE 0.9 % IJ SOLN
INTRAMUSCULAR | Status: AC
  Filled 2018-03-27: qty 10

## 2018-03-27 MED ORDER — GABAPENTIN 400 MG PO CAPS
400.0000 mg | ORAL_CAPSULE | Freq: Three times a day (TID) | ORAL | Status: DC
  Administered 2018-03-27 – 2018-04-01 (×14): 400 mg via ORAL
  Filled 2018-03-27 (×14): qty 1

## 2018-03-27 MED ORDER — HEPARIN SODIUM (PORCINE) 5000 UNIT/ML IJ SOLN
5000.0000 [IU] | Freq: Three times a day (TID) | INTRAMUSCULAR | Status: DC
  Administered 2018-03-27 – 2018-04-01 (×14): 5000 [IU] via SUBCUTANEOUS
  Filled 2018-03-27 (×14): qty 1

## 2018-03-27 MED ORDER — ALVIMOPAN 12 MG PO CAPS
12.0000 mg | ORAL_CAPSULE | ORAL | Status: AC
  Administered 2018-03-27: 12 mg via ORAL
  Filled 2018-03-27: qty 1

## 2018-03-27 MED ORDER — SACCHAROMYCES BOULARDII 250 MG PO CAPS
250.0000 mg | ORAL_CAPSULE | Freq: Two times a day (BID) | ORAL | Status: DC
  Administered 2018-03-27 – 2018-04-01 (×10): 250 mg via ORAL
  Filled 2018-03-27 (×10): qty 1

## 2018-03-27 MED ORDER — ROCURONIUM BROMIDE 10 MG/ML (PF) SYRINGE
PREFILLED_SYRINGE | INTRAVENOUS | Status: DC | PRN
  Administered 2018-03-27: 60 mg via INTRAVENOUS
  Administered 2018-03-27: 20 mg via INTRAVENOUS

## 2018-03-27 SURGICAL SUPPLY — 76 items
APPLIER CLIP 5 13 M/L LIGAMAX5 (MISCELLANEOUS)
APPLIER CLIP ROT 10 11.4 M/L (STAPLE)
APR CLP MED LRG 11.4X10 (STAPLE)
APR CLP MED LRG 5 ANG JAW (MISCELLANEOUS)
BLADE EXTENDED COATED 6.5IN (ELECTRODE) IMPLANT
BNDG GAUZE ELAST 4 BULKY (GAUZE/BANDAGES/DRESSINGS) ×3 IMPLANT
CABLE HIGH FREQUENCY MONO STRZ (ELECTRODE) ×4 IMPLANT
CATH MUSHROOM 28FR (CATHETERS) IMPLANT
CATH MUSHROOM 30FR (CATHETERS) IMPLANT
CELLS DAT CNTRL 66122 CELL SVR (MISCELLANEOUS) IMPLANT
CLIP APPLIE 5 13 M/L LIGAMAX5 (MISCELLANEOUS) IMPLANT
CLIP APPLIE ROT 10 11.4 M/L (STAPLE) IMPLANT
COVER WAND RF STERILE (DRAPES) IMPLANT
DECANTER SPIKE VIAL GLASS SM (MISCELLANEOUS) ×4 IMPLANT
DISSECTOR BLUNT TIP ENDO 5MM (MISCELLANEOUS) IMPLANT
DRAIN CHANNEL 19F RND (DRAIN) IMPLANT
DRAPE SURG IRRIG POUCH 19X23 (DRAPES) ×4 IMPLANT
DRSG OPSITE POSTOP 4X10 (GAUZE/BANDAGES/DRESSINGS) IMPLANT
DRSG OPSITE POSTOP 4X6 (GAUZE/BANDAGES/DRESSINGS) IMPLANT
DRSG OPSITE POSTOP 4X8 (GAUZE/BANDAGES/DRESSINGS) IMPLANT
ELECT REM PT RETURN 15FT ADLT (MISCELLANEOUS) ×4 IMPLANT
EVACUATOR SILICONE 100CC (DRAIN) IMPLANT
GAUZE SPONGE 4X4 12PLY STRL (GAUZE/BANDAGES/DRESSINGS) ×3 IMPLANT
GLOVE BIO SURGEON STRL SZ7.5 (GLOVE) ×8 IMPLANT
GLOVE INDICATOR 8.0 STRL GRN (GLOVE) ×8 IMPLANT
GOWN STRL REUS W/TWL XL LVL3 (GOWN DISPOSABLE) ×16 IMPLANT
HOLDER FOLEY CATH W/STRAP (MISCELLANEOUS) ×4 IMPLANT
LIGASURE IMPACT 36 18CM CVD LR (INSTRUMENTS) IMPLANT
NDL INSUFFLATION 14GA 120MM (NEEDLE) IMPLANT
NEEDLE INSUFFLATION 14GA 120MM (NEEDLE) IMPLANT
PACK COLON (CUSTOM PROCEDURE TRAY) ×4 IMPLANT
PAD ABD 8X10 STRL (GAUZE/BANDAGES/DRESSINGS) ×3 IMPLANT
PAD POSITIONING PINK XL (MISCELLANEOUS) ×4 IMPLANT
PORT LAP GEL ALEXIS MED 5-9CM (MISCELLANEOUS) IMPLANT
RETRACTOR WND ALEXIS 18 MED (MISCELLANEOUS) IMPLANT
RTRCTR WOUND ALEXIS 18CM MED (MISCELLANEOUS)
SCISSORS LAP 5X35 DISP (ENDOMECHANICALS) ×4 IMPLANT
SEALER TISSUE G2 STRG ARTC 35C (ENDOMECHANICALS) ×4 IMPLANT
SET IRRIG TUBING LAPAROSCOPIC (IRRIGATION / IRRIGATOR) ×4 IMPLANT
SLEEVE ADV FIXATION 5X100MM (TROCAR) ×8 IMPLANT
SPONGE DRAIN TRACH 4X4 STRL 2S (GAUZE/BANDAGES/DRESSINGS) IMPLANT
STAPLER 90 3.5 STAND SLIM (STAPLE) ×4
STAPLER 90 3.5 STD SLIM (STAPLE) ×1 IMPLANT
STAPLER PROXIMATE 75MM BLUE (STAPLE) ×3 IMPLANT
STAPLER VISISTAT 35W (STAPLE) IMPLANT
SUT ETHILON 3 0 PS 1 (SUTURE) IMPLANT
SUT PDS AB 1 CT1 27 (SUTURE) ×6 IMPLANT
SUT PDS AB 1 CTX 36 (SUTURE) IMPLANT
SUT PDS AB 1 TP1 54 (SUTURE) IMPLANT
SUT PDS AB 1 TP1 96 (SUTURE) IMPLANT
SUT PROLENE 2 0 KS (SUTURE) ×4 IMPLANT
SUT PROLENE 2 0 SH DA (SUTURE) ×4 IMPLANT
SUT SILK 2 0 (SUTURE) ×4
SUT SILK 2 0 SH CR/8 (SUTURE) ×4 IMPLANT
SUT SILK 2-0 18XBRD TIE 12 (SUTURE) ×2 IMPLANT
SUT SILK 3 0 (SUTURE) ×4
SUT SILK 3 0 SH CR/8 (SUTURE) ×4 IMPLANT
SUT SILK 3-0 18XBRD TIE 12 (SUTURE) ×2 IMPLANT
SUT VIC AB 2-0 SH 27 (SUTURE)
SUT VIC AB 2-0 SH 27X BRD (SUTURE) IMPLANT
SUT VIC AB 3-0 SH 18 (SUTURE) IMPLANT
SUT VIC AB 3-0 SH 27 (SUTURE)
SUT VIC AB 3-0 SH 27X BRD (SUTURE) IMPLANT
SUT VICRYL 2 0 18  UND BR (SUTURE) ×2
SUT VICRYL 2 0 18 UND BR (SUTURE) ×2 IMPLANT
SYS LAPSCP GELPORT 120MM (MISCELLANEOUS)
SYSTEM LAPSCP GELPORT 120MM (MISCELLANEOUS) IMPLANT
TAPE CLOTH SURG 6X10 WHT LF (GAUZE/BANDAGES/DRESSINGS) ×3 IMPLANT
TOWEL OR 17X26 10 PK STRL BLUE (TOWEL DISPOSABLE) IMPLANT
TOWEL OR NON WOVEN STRL DISP B (DISPOSABLE) ×4 IMPLANT
TRAY FOLEY MTR SLVR 14FR STAT (SET/KITS/TRAYS/PACK) ×3 IMPLANT
TRAY FOLEY MTR SLVR 16FR STAT (SET/KITS/TRAYS/PACK) IMPLANT
TRAY IRRIG W/60CC SYR STRL (SET/KITS/TRAYS/PACK) ×1 IMPLANT
TROCAR ADV FIXATION 5X100MM (TROCAR) ×4 IMPLANT
TROCAR XCEL BLUNT TIP 100MML (ENDOMECHANICALS) IMPLANT
TUBING INSUF HEATED (TUBING) ×4 IMPLANT

## 2018-03-27 NOTE — Transfer of Care (Signed)
Immediate Anesthesia Transfer of Care Note  Patient: Wanda Baker  Procedure(s) Performed: CLOSURE OF ILEOSTOMY (N/A Abdomen)  Patient Location: PACU  Anesthesia Type:General  Level of Consciousness: awake, alert , oriented and patient cooperative  Airway & Oxygen Therapy: Patient Spontanous Breathing and Patient connected to face mask oxygen  Post-op Assessment: Report given to RN, Post -op Vital signs reviewed and stable and Patient moving all extremities  Post vital signs: Reviewed and stable  Last Vitals:  Vitals Value Taken Time  BP 158/88 03/27/2018 10:45 AM  Temp    Pulse 97 03/27/2018 10:48 AM  Resp 19 03/27/2018 10:48 AM  SpO2 100 % 03/27/2018 10:48 AM  Vitals shown include unvalidated device data.  Last Pain:  Vitals:   03/27/18 0639  TempSrc:   PainSc: 0-No pain         Complications: No apparent anesthesia complications

## 2018-03-27 NOTE — Anesthesia Procedure Notes (Signed)
Procedure Name: Intubation Date/Time: 03/27/2018 7:39 AM Performed by: Mitzie Na, CRNA Pre-anesthesia Checklist: Patient identified, Emergency Drugs available, Suction available, Patient being monitored and Timeout performed Patient Re-evaluated:Patient Re-evaluated prior to induction Oxygen Delivery Method: Circle system utilized Preoxygenation: Pre-oxygenation with 100% oxygen Induction Type: IV induction Ventilation: Mask ventilation without difficulty Laryngoscope Size: Glidescope Grade View: Grade I Tube type: Oral Tube size: 7.0 mm Number of attempts: 1 Airway Equipment and Method: Video-laryngoscopy Placement Confirmation: ETT inserted through vocal cords under direct vision,  CO2 detector and breath sounds checked- equal and bilateral Secured at: 23 cm Tube secured with: Tape Dental Injury: Teeth and Oropharynx as per pre-operative assessment

## 2018-03-27 NOTE — Op Note (Signed)
03/27/2018  10:27 AM  PATIENT:  Wanda Baker  57 y.o. female  Patient Care Team: [name], MD as PCP - General (Family Medicine) Ileana Roup, MD as Consulting Physician (General Surgery) Fanny Skates, MD as Consulting Physician (General Surgery) Mauri Pole, MD as Consulting Physician (Gastroenterology)  PRE-OPERATIVE DIAGNOSIS:  Ileostomy status, history of rectal cancer  POST-OPERATIVE DIAGNOSIS:  Same  PROCEDURE:  Takedown of diverting ileostomy  SURGEON:  Sharon Mt. Dema Severin, MD  ASSISTANT: Michael Boston, MD  ANESTHESIA:   general  COUNTS:  Sponge, needle and instrument counts were reported correct x2 at the conclusion of the operation.  EBL: 100cc  DRAINS: None  SPECIMEN: Ileostomy trim  COMPLICATIONS: None  COUNTS: Sponge, needle and instrument counts were reported correct x2.  FINDINGS: Ileostomy with 2 ends at stoma site. Taken down via stomal incision around ileostomy. Adhesions of ileostomy to rectus and abdominal wall. Side to side, functional end to end ileo-ileal anastomosis fashioned.  DISPOSITION: PACU in satisfactory condition  INDICATION: [name] is a very pleasant 10yoF with hx of obstructing rectal ca s/p Hartmann's (Dr. Dalbert Batman), adjuvant chemoXRT and then most recently Hartmann's reversal by myself with exlap, 64min LOA, splenic flexure takedown, colostomy takedown with double stapled colorectal anastomosis, small bowel resection with enteroenterostomy, diverting ileostomy, flex sig on 08/08/17. Postoperatively she had an expected ileus but ultimately recovered and was discharged 08/15/17. she is seen back in the office in March with ostomy output controlled on 3 times a day Imodium as well as Metamucil. She is here for f/u regarding surgery for stoma reversal.  Initially had some issues with her stoma appliance not fitting appropriately and having some skin irritation - has since resolved  She denies any problems with her  stoma or appliance. She is tolerating a diet, weight stable, ostomy output controlled on when necessary Imodium. She is still taking Metamucil.  She underwent GGE 11/18/17 which showed satisfactory appearance of the colorectal anastomosis. No contrast leak or adverse features. She underwent flexible sigmoidoscopy by myself 11/24/17 which showed a normal-appearing end-to-end, patent colorectal anastomosis in the mid rectum that was 6-7 cm in the anal verge and palpable on exam. She desired reversal of her ileostomy. Options were discussed and she elected to proceed with surgery. Please refer to H&P for details regarding this discussion.  DESCRIPTION: The patient was identified in preop holding and taken to the OR where she was placed on the operating room table and SCDs were placed. General endotracheal anesthesia was induced without difficulty. The patient was then positioned in low lithotomy using Allen stirrups.  All pressure points were padded and verified. A foley catheter was placed.  A digital rectal exam was performed and demonstrated a patent anastomosis at the tip of my finger.  The patient was then prepped and draped in the usual sterile fashion. A surgical timeout was performed indicating the correct patient, procedure, positioning and need for preoperative antibiotics.   An incision was made around the ileostomy incorporating a very small cuff of skin with it.  Dissection was carried down through the subcutaneous tissue.  The plane between the proximal and distal limbs of ileum and subcutaneous tissue was identified and developed.  This was carried down to the level of the anterior rectus fascia.  The fascia was carefully dissected free of the ileum on both sides as well as the associated mesentery.  The rectus muscle was quite adherent to the ileum and was gently dissected free.  All dissection was  done sharply.  The peritoneum was identified and dissected free and entered.  Laterally,  everything was quite free.  Medially, there was a loop of small bowel which came up along the proximal limb of the ileostomy.  Adhesio lysis of this was done using Metzenbaum scissors.  This loop of bowel was inspected and there are no serosal injuries noted.  After adequate ileum was mobilized and approximately 80 mm reached above the level of the skin, attention was turned to performing the anastomosis.  The 2 ends of ileum were dissected free from one another.  Both limbs were injected with Betadine and no serosal tears were present.  An enterotomy was made on both respective limbs approximately 1 cm below the stoma orifice.  The loops were aligned and the mesentery was free of the stapler.  Using a 75 mm GIA blue load, a side-to-side, functional end-to-end anastomosis was created.  This was done on the antimesenteric side of the bowel loops.  The anastomosis was then inspected and noted to be hemostatic.  The respective loops of bowel were both pink and well perfused.  The ileostomy and common enterotomies were then divided using a TA 90 blue load stapler.  This was passed off the specimen.  The anastomosis was approximately the breath of 3 fingers and widely patent.  The TA staple line was inspected and noted to be hemostatic.  The corners of the staple line were then dunked using 2-0 silk suture.  A "crotch" suture was placed at the distal aspect of the anastomosis.  This was all then dunked back into the abdomen and laid nicely without kinking or twists.  This area the abdomen was then irrigated with saline and reinspection of all visible loops of intestine demonstrated no evidence of serosal injuries.  The fascia had been widened/opened inferiorly to facilitate exposure during takedown of the stoma.  Sponge, needle, and instrument counts were reported correct x2.  The fascia was then closed in a vertical manner using two #1 PDS sutures.  The fascia was palpated and noted to be completely closed.  Local  anesthetic consisting of 0.25% Marcaine with epinephrine and Exparel was infiltrated into rectus and subcutaneous tissues.  A pursestring 2-0 Vicryl suture was then used to tighten the prior ileostomy orifice at the level of the skin.  This was tied down tight enough to accommodate the tip of the finger.  A moist Kerlix was then packed into the subcutaneous tissue, really was covered with 4 x 4 gauze, and secured with an ABD pad. The patient was then awakened from general anesthesia, extubated, and transferred to a stretcher for transport to PACU in satisfactory condition.

## 2018-03-27 NOTE — Anesthesia Postprocedure Evaluation (Signed)
Anesthesia Post Note  Patient: Wanda Baker  Procedure(s) Performed: CLOSURE OF ILEOSTOMY (N/Wanda Abdomen)     Patient location during evaluation: PACU Anesthesia Type: General Level of consciousness: awake and alert Pain management: pain level controlled Vital Signs Assessment: post-procedure vital signs reviewed and stable Respiratory status: spontaneous breathing, nonlabored ventilation, respiratory function stable and patient connected to nasal cannula oxygen Cardiovascular status: blood pressure returned to baseline and stable Postop Assessment: no apparent nausea or vomiting Anesthetic complications: no    Last Vitals:  Vitals:   03/27/18 0552 03/27/18 1045  BP: (!) 154/85 (!) 158/88  Pulse: 77 93  Resp: 16 11  Temp: 36.6 C 36.9 C  SpO2: 100% 100%    Last Pain:  Vitals:   03/27/18 1045  TempSrc:   PainSc: Wanda Baker Wanda Baker

## 2018-03-27 NOTE — Anesthesia Postprocedure Evaluation (Signed)
Anesthesia Post Note  Patient: SHATERRIA SAGER  Procedure(s) Performed: CLOSURE OF ILEOSTOMY (N/A Abdomen)     Patient location during evaluation: PACU Anesthesia Type: General Level of consciousness: awake and alert Pain management: pain level controlled Vital Signs Assessment: post-procedure vital signs reviewed and stable Respiratory status: spontaneous breathing, nonlabored ventilation, respiratory function stable and patient connected to nasal cannula oxygen Cardiovascular status: blood pressure returned to baseline and stable Postop Assessment: no apparent nausea or vomiting Anesthetic complications: no    Last Vitals:  Vitals:   03/27/18 0552 03/27/18 1045  BP: (!) 154/85 (!) 158/88  Pulse: 77 93  Resp: 16 11  Temp: 36.6 C 36.9 C  SpO2: 100% 100%    Last Pain:  Vitals:   03/27/18 1045  TempSrc:   PainSc: [place] A [place]

## 2018-03-27 NOTE — H&P (Signed)
CC: Here today for surgery  HPI: Ms. Kraft is a very pleasant 68yoF with hx of obstructing rectal ca s/p Hartmann's (Dr. Dalbert Batman), adjuvant chemoXRT and then most recently Hartmann's reversal by myself with exlap, 59min LOA, splenic flexure takedown, colostomy takedown with double stapled colorectal anastomosis, small bowel resection with enteroenterostomy, diverting ileostomy, flex sig on 08/08/17. Postoperatively she had an expected ileus but ultimately recovered and was discharged 08/15/17. she is seen back in the office in March with ostomy output controlled on 3 times a day Imodium as well as Metamucil. She is here for f/u regarding surgery for stoma reversal.  Initially had some issues with her stoma appliance not fitting appropriately and having some skin irritation - has since resolved  She denies any problems with her stoma or appliance. She is tolerating a diet, weight stable, ostomy output controlled on when necessary Imodium. She is still taking Metamucil.  She underwent GGE 11/18/17 which showed satisfactory appearance of the colorectal anastomosis. No contrast leak or adverse features. She underwent flexible sigmoidoscopy by myself 11/24/17 which showed a normal-appearing end-to-end, patent colorectal anastomosis in the mid rectum that was 6-7 cm in the anal verge and palpable on exam.  Past Medical History:  Diagnosis Date  . Adenocarcinoma of colon ([place]) 09/23/2016  . Allergy    uses Nasocort daily and takes Singulair nightly   . Arthritis   . Chronic back pain    buldging disc  . Diabetes mellitus without complication (HCC)    diet controlled  . DJD (degenerative joint disease)    whole spine  . GERD (gastroesophageal reflux disease)    takes Omeprazole daily   . History of bronchitis    many yrs ago  . Hyperlipidemia    takes Atorvastatin daily  . Hypertension   . 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 Weakness    left arm  resolved now    Past Surgical History:  Procedure Laterality Date  . ABDOMINAL HYSTERECTOMY  2011   Supracervical Hysterectomy complete  . ANTERIOR CERVICAL DECOMP/DISCECTOMY FUSION N/A 12/02/2013   Procedure: ANTERIOR CERVICAL DECOMPRESSION/DISCECTOMY FUSION 3 LEVELS;  Surgeon: Erline Levine, MD;  Location: [facility];  Service: Neurosurgery;  Laterality: N/A;  C4-5 C5-6 C6-7 Anterior cervical decompression/diskectomy/fusion  . BOWEL RESECTION N/A 09/18/2016   Procedure: LOW ANTERIOR COLON RESECTION WITH COLOSTOMY;  Surgeon: Fanny Skates, MD;  Location: [facility];  Service: General;  Laterality: N/A;  . BOWEL RESECTION  08/08/2017   Procedure: SMALL BOWEL RESECTION x 2;  Surgeon: Ileana Roup, MD;  Location: [facility];  Service: General;;  . CESAREAN SECTION  86/88  . COLOSTOMY TAKEDOWN N/A 08/08/2017   Procedure: HARTMANNS TAKEDOWN OF COLOSTOMY WITH COLORECTAL ANASTOMOIS ERAS PATHWAY;  Surgeon: Ileana Roup, MD;  Location: [facility];  Service: General;  Laterality: N/A;  . CYSTOSCOPY W/ URETERAL STENT PLACEMENT N/A 08/08/2017   Procedure: CYSTOSCOPY WITH URETERAL CATHETERS;  Surgeon: Ardis Hughs, MD;  [facility];  Service: Urology;  Laterality: N/A;  . FLEXIBLE SIGMOIDOSCOPY N/A 09/17/2016   Procedure: FLEXIBLE SIGMOIDOSCOPY;  Surgeon: Mauri Pole, MD;  Location: [facility];  Service: Endoscopy;  Laterality: N/A;  . FLEXIBLE SIGMOIDOSCOPY N/A 08/08/2017   Procedure: FLEXIBLE SIGMOIDOSCOPY;  Surgeon: Ileana Roup, MD;  [facility];  Service: General;  Laterality: N/A;  . FLEXIBLE SIGMOIDOSCOPY N/A 11/24/2017   Procedure: Beryle Quant;  Surgeon: Ileana Roup, MD;  [facility];  Service: General;  Laterality:  N/A;  . ILEOSTOMY N/A 08/08/2017   Procedure: DIVERTIN  ILEOSTOMY;  Surgeon: Ileana Roup, MD;  [facility];  Service: General;  Laterality: N/A;  . LAPAROTOMY N/A 08/08/2017   Procedure: EXPLORATORY LAPAROTOMY;   Surgeon: Ileana Roup, MD;  [facility];  Service: General;  Laterality: N/A;  . LYSIS OF ADHESION  08/08/2017   Procedure: LYSIS OF ADHESION;  Surgeon: Ileana Roup, MD;  [facility];  Service: General;;  . RECTAL EXAM UNDER ANESTHESIA  11/24/2017   Procedure: RECTAL EXAM UNDER ANESTHESIA;  Surgeon: Ileana Roup, MD;  [facility];  Service: General;;  . TUBAL LIGATION      Family History  Problem Relation Age of Onset  . Heart disease Maternal Grandmother   . Diabetes Maternal Grandmother   . Hypertension Maternal Grandmother   . Heart disease Maternal Grandfather   . Heart disease Paternal Grandmother   . Diabetes Paternal Grandmother   . Alzheimer's disease Paternal Grandmother     Social:  reports that she has quit smoking. Her smoking use included cigarettes. She has a 7.50 pack-year smoking history. She has never used smokeless tobacco. She reports that she does not drink alcohol or use drugs.  Allergies:  Allergies  Allergen Reactions  . Hydrocodone Other (See Comments)    Palpitations / dizziness  . Nsaids Other (See Comments)    GERD "tears my stomach up"  . Penicillins Swelling, Rash and Other (See Comments)    Has patient had a PCN reaction causing immediate rash, facial/tongue/throat swelling, SOB or lightheadedness with hypotension: Yes Has patient had a PCN reaction causing severe rash involving mucus membranes or skin necrosis: No Has patient had a PCN reaction that required hospitalization No Has patient had a PCN reaction occurring within the last 10 years: No If all of the above answers are "NO", then may proceed with Cephalosporin use.   . Prednisone Rash    Medications: I have reviewed the patient's current medications.  Results for orders placed or performed during the hospital encounter of 03/27/18 (from the past 48 hour(s))  Glucose, capillary     Status: None   Collection Time: 03/27/18  5:51 AM  Result  Value Ref Range   Glucose-Capillary 93 70 - 99 mg/dL   Comment 1 Notify RN    Comment 2 Document in Chart     No results found.  ROS - all of the below systems have been reviewed with the patient and positives are indicated with bold text General: chills, fever or night sweats Eyes: blurry vision or double vision ENT: epistaxis or sore throat Allergy/Immunology: itchy/watery eyes or nasal congestion Hematologic/Lymphatic: bleeding problems, blood clots or swollen lymph nodes Endocrine: temperature intolerance or unexpected weight changes Breast: new or changing breast lumps or nipple discharge Resp: cough, shortness of breath, or wheezing CV: chest pain or dyspnea on exertion GI: as per HPI GU: dysuria, trouble voiding, or hematuria MSK: joint pain or joint stiffness Neuro: TIA or stroke symptoms Derm: pruritus and skin lesion changes Psych: anxiety and depression  PE Blood pressure (!) 154/85, pulse 77, temperature 97.9 F (36.6 C), temperature source Oral, resp. rate 16, height 5\' 7"  (1.702 m), weight 62.6 kg, SpO2 100 %. Constitutional: NAD; conversant; no deformities Eyes: Moist conjunctiva; no lid lag; anicteric; PERRL Neck: Trachea midline; no thyromegaly Lungs: Normal respiratory effort; no tactile fremitus CV: RRR; no palpable thrills; no pitting edema GI: Abd soft, NT/ND; no palpable hepatosplenomegaly MSK: Normal gait;  no clubbing/cyanosis Psychiatric: Appropriate affect; alert and oriented x3 Lymphatic: No palpable cervical or axillary lymphadenopathy  Results for orders placed or performed during the hospital encounter of 03/27/18 (from the past 48 hour(s))  Glucose, capillary     Status: None   Collection Time: 03/27/18  5:51 AM  Result Value Ref Range   Glucose-Capillary 93 70 - 99 mg/dL   Comment 1 Notify RN    Comment 2 Document in Chart    A/P: Ms. Zervas is a very pleasant 27yoF with hx of obstructing proximal rectal cancer status post Hartman's  procedure 09/2016 by Dr. Dalbert Batman. She then underwent adjuvant chemoradiation which she completed 05/04/17 and tolerated well. Now s/p exlap/LOA/Hartmann's takedown/SBR/ileostomy (which is technically an end-loop - distal end in wound but disconnected from proximal) 08/08/17 -Flex sig clear; GGE normal in appearance without leak/sinus -The anatomy and physiology of the GI tract was discussed at length with the patient again today. The pathophysiology of ileostomy/ileostomy reversal was discussed at length with associated pictures. -We discussed at length ileostomy reversal surgery. We discussed the potential issues that we may encounter with her surgery given that she has 2 separate ends of small bowel brought up. We discussed peristomal incision, possible laparoscopic approaches as well as open techniques. We discussed that which approach we use will be dictated by intraoperative findings. -The planned procedure, material risks (including, but not limited to, pain, bleeding, infection, scarring, need for blood transfusion, damage to surrounding structures- blood vessels/nerves/viscus/organs, damage to ureter, urine leak, leak from small bowel anastomosis, need for additional procedures, need for stoma which may be permanent, hernia, recurrence, pneumonia, heart attack, stroke, death) benefits and alternatives to surgery were discussed at length. The patient's questions were answered to her satisfaction, she voiced understanding and elected to proceed with surgery. Additionally, we discussed typical postoperative expectations and the recovery process.  Sharon Mt. Dema Severin, M.D. General and Colorectal Surgery [facility]

## 2018-03-28 ENCOUNTER — Encounter (HOSPITAL_COMMUNITY): Payer: Self-pay | Admitting: Surgery

## 2018-03-28 LAB — BASIC METABOLIC PANEL
ANION GAP: 11 (ref 5–15)
BUN: 11 mg/dL (ref 6–20)
CALCIUM: 9.3 mg/dL (ref 8.9–10.3)
CO2: 28 mmol/L (ref 22–32)
CREATININE: 0.65 mg/dL (ref 0.44–1.00)
Chloride: 101 mmol/L (ref 98–111)
GFR calc non Af Amer: >60 mL/min (ref >60)
Glucose, Bld: 98 mg/dL (ref 70–99)
Potassium: 4.4 mmol/L (ref 3.5–5.1)
SODIUM: 140 mmol/L (ref 135–145)

## 2018-03-28 LAB — CBC
HCT: 38.6 % (ref 36.0–46.0)
Hemoglobin: 13 g/dL (ref 12.0–15.0)
MCH: 29 pg (ref 26.0–34.0)
MCHC: 33.7 g/dL (ref 30.0–36.0)
MCV: 86 fL (ref 80.0–100.0)
NRBC: 0 % (ref 0.0–0.2)
Platelets: 241 10*3/uL (ref 150–400)
RBC: 4.49 MIL/uL (ref 3.87–5.11)
RDW: 13 % (ref 11.5–15.5)
WBC: 15.5 10*3/uL — AB (ref 4.0–10.5)

## 2018-03-28 MED ORDER — TRAMADOL HCL 50 MG PO TABS: 50.0000 mg | ORAL_TABLET | Freq: Four times a day (QID) | ORAL | Status: DC

## 2018-03-28 MED ORDER — TRAMADOL HCL 50 MG PO TABS
50.0000 mg | ORAL_TABLET | Freq: Four times a day (QID) | ORAL | Status: DC | PRN
  Administered 2018-03-29 – 2018-04-01 (×7): 50 mg via ORAL
  Filled 2018-03-28 (×7): qty 1

## 2018-03-28 MED ORDER — OXYCODONE HCL 5 MG PO TABS: 5.0000 mg | ORAL_TABLET | Freq: Four times a day (QID) | ORAL | Status: DC | PRN

## 2018-03-28 NOTE — Progress Notes (Signed)
Subjective Emesis x1 immediately postop, none since. Denies n/v overnight. Feeling well this morning. Up in chair 3x since OR. Reports soreness at ileostomy closure site but denies discomfort elsewhere in abdomen.  Objective: Vital signs in last 24 hours: Temp:  [97.4 F (36.3 C)-98.5 F (36.9 C)] 98.1 F (36.7 C) (10/12 0548) Pulse Rate:  [83-116] 104 (10/12 0548) Resp:  [11-18] 15 (10/12 0548) BP: (84-180)/(52-97) 120/78 (10/12 0548) SpO2:  [95 %-100 %] 97 % (10/12 0548) Weight:  [68 kg] 68 kg (10/12 0629)    Intake/Output from previous day: 10/11 0701 - 10/12 0700 In: 2664.4 [I.V.:2514.4; IV Piggyback:150] Out: 2725 [Urine:2575; Emesis/NG output:50; Blood:100] Intake/Output this shift: No intake/output data recorded.  Gen: NAD, comfortable CV: RRR Pulm: Normal work of breathing Abd: Soft, mildly ttp around prior stoma site, none elsewhere. Nondistended. No rebound. No guarding Ext: SCDs in place  Lab Results: CBC  Recent Labs    03/27/18 1153 03/28/18 0436  WBC 12.3* 15.5*  HGB 13.4 13.0  HCT 40.5 38.6  PLT 290 241   BMET Recent Labs    03/27/18 1153 03/28/18 0436  NA  --  140  K  --  4.4  CL  --  101  CO2  --  28  GLUCOSE  --  98  BUN  --  11  CREATININE 0.69 0.65  CALCIUM  --  9.3   PT/INR No results for input(s): LABPROT, INR in the last 72 hours. ABG No results for input(s): PHART, HCO3 in the last 72 hours.  Invalid input(s): PCO2, PO2  Studies/Results:  Anti-infectives: Anti-infectives (From admission, onward)   Start     Dose/Rate Route Frequency Ordered Stop   03/27/18 0600  gentamicin (GARAMYCIN) 320 mg in dextrose 5 % 100 mL IVPB     320 mg 108 mL/hr over 60 Minutes Intravenous On call to O.R. 03/26/18 1446 03/27/18 0752   03/27/18 0600  clindamycin (CLEOCIN) IVPB 900 mg     900 mg 100 mL/hr over 30 Minutes Intravenous On call to O.R. 03/26/18 1446 03/27/18 0750       Assessment/Plan: Patient Active Problem List   Diagnosis  Date Noted  . Ileostomy status ([place]) 03/27/2018  . Healthcare maintenance 01/18/2018  . Pyelonephritis 09/04/2017  . Hyperlipidemia   . GERD (gastroesophageal reflux disease)   . Chronic back pain   . Weakness   . 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 S/p colostomy takedown with protective loop ileostomy 08/08/2017 08/11/2017  . Physical deconditioning 10/02/2016  . Rectal cancer ([place]) 10/01/2016  . Anemia of chronic disease 09/27/2016  . 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 Sinus tachycardia 12/16/2013  . Cervical radiculopathy due to degenerative joint disease of spine 02/19/2013  . Diabetes type 2, controlled ([place]) 12/03/2011  . HYPERTENSION, BENIGN ESSENTIAL 11/20/2007   s/p Procedure(s): CLOSURE OF ILEOSTOMY 03/27/2018  -Clear liquids today -D/C Foley -Continue MIVF -Continue Entereg until good bowel function -Ambulate 5x/day; out of bed to chair during day -PPx: SQH, SCDs -Dispo: Pending return of bowel function, toleration of diet, pain control on oral analgesics   LOS: 1 day   Sharon Mt. Dema Severin, M.D. General and Colorectal Surgery [facility]

## 2018-03-28 NOTE — Plan of Care (Signed)
Patient lying in bed this morning. No complaints or concerns voiced at this time. IV infusing. Will continue to monitor.

## 2018-03-29 LAB — CBC WITH DIFFERENTIAL/PLATELET
ABS IMMATURE GRANULOCYTES: 0.04 10*3/uL (ref 0.00–0.07)
BASOS PCT: 0 %
Basophils Absolute: 0 10*3/uL (ref 0.0–0.1)
Eosinophils Absolute: 0.3 10*3/uL (ref 0.0–0.5)
Eosinophils Relative: 3 %
HCT: 38.3 % (ref 36.0–46.0)
Hemoglobin: 12.5 g/dL (ref 12.0–15.0)
Immature Granulocytes: 0 %
LYMPHS PCT: 15 %
Lymphs Abs: 1.4 10*3/uL (ref 0.7–4.0)
MCH: 29 pg (ref 26.0–34.0)
MCHC: 32.6 g/dL (ref 30.0–36.0)
MCV: 88.9 fL (ref 80.0–100.0)
MONOS PCT: 9 %
Monocytes Absolute: 0.9 10*3/uL (ref 0.1–1.0)
NEUTROS PCT: 73 %
NRBC: 0 % (ref 0.0–0.2)
Neutro Abs: 6.8 10*3/uL (ref 1.7–7.7)
Platelets: 221 10*3/uL (ref 150–400)
RBC: 4.31 MIL/uL (ref 3.87–5.11)
RDW: 12.7 % (ref 11.5–15.5)
WBC: 9.4 10*3/uL (ref 4.0–10.5)

## 2018-03-29 LAB — BASIC METABOLIC PANEL
ANION GAP: 9 (ref 5–15)
BUN: 10 mg/dL (ref 6–20)
CHLORIDE: 101 mmol/L (ref 98–111)
CO2: 30 mmol/L (ref 22–32)
Calcium: 9.2 mg/dL (ref 8.9–10.3)
Creatinine, Ser: 0.58 mg/dL (ref 0.44–1.00)
GFR calc Af Amer: >60 mL/min (ref >60)
GFR calc non Af Amer: >60 mL/min (ref >60)
Glucose, Bld: 92 mg/dL (ref 70–99)
POTASSIUM: 3.9 mmol/L (ref 3.5–5.1)
SODIUM: 140 mmol/L (ref 135–145)

## 2018-03-29 NOTE — Progress Notes (Signed)
Subjective Tolerating clear liquids well - denies n/v. Soreness at ileostomy closure site but reasonably controlled. Ambulating without difficulty. Having bowel movements - multiple.   Objective: Vital signs in last 24 hours: Temp:  [97.5 F (36.4 C)-98.7 F (37.1 C)] 98.7 F (37.1 C) (10/13 0404) Pulse Rate:  [72-104] 102 (10/13 0404) Resp:  [16-18] 16 (10/13 0404) BP: (109-152)/(72-89) 134/89 (10/13 0404) SpO2:  [96 %-98 %] 96 % (10/13 0404)    Intake/Output from previous day: 10/12 0701 - 10/13 0700 In: 2750.6 [P.O.:660; I.V.:2090.6] Out: 1750 [Urine:1750] Intake/Output this shift: No intake/output data recorded.  Gen: NAD, comfortable CV: RRR Pulm: Normal work of breathing Abd: Soft, mildly ttp around prior stoma site, none elsewhere. Dressing removed. Abdomen nondistended. No rebound. No guarding Ext: SCDs in place  Lab Results: CBC  Recent Labs    03/28/18 0436 03/29/18 0640  WBC 15.5* 9.4  HGB 13.0 12.5  HCT 38.6 38.3  PLT 241 221   BMET Recent Labs    03/28/18 0436 03/29/18 0640  NA 140 140  K 4.4 3.9  CL 101 101  CO2 28 30  GLUCOSE 98 92  BUN 11 10  CREATININE 0.65 0.58  CALCIUM 9.3 9.2   PT/INR No results for input(s): LABPROT, INR in the last 72 hours. ABG No results for input(s): PHART, HCO3 in the last 72 hours.  Invalid input(s): PCO2, PO2  Studies/Results:  Anti-infectives: Anti-infectives (From admission, onward)   Start     Dose/Rate Route Frequency Ordered Stop   03/27/18 0600  gentamicin (GARAMYCIN) 320 mg in dextrose 5 % 100 mL IVPB     320 mg 108 mL/hr over 60 Minutes Intravenous On call to O.R. 03/26/18 1446 03/27/18 0752   03/27/18 0600  clindamycin (CLEOCIN) IVPB 900 mg     900 mg 100 mL/hr over 30 Minutes Intravenous On call to O.R. 03/26/18 1446 03/27/18 0750       Assessment/Plan: Patient Active Problem List   Diagnosis Date Noted  . Ileostomy status ([place]) 03/27/2018  . Healthcare maintenance 01/18/2018  .  Pyelonephritis 09/04/2017  . Hyperlipidemia   . GERD (gastroesophageal reflux disease)   . Chronic back pain   . Weakness   . 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 S/p colostomy takedown with protective loop ileostomy 08/08/2017 08/11/2017  . Physical deconditioning 10/02/2016  . Rectal cancer ([place]) 10/01/2016  . Anemia of chronic disease 09/27/2016  . 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 Sinus tachycardia 12/16/2013  . Cervical radiculopathy due to degenerative joint disease of spine 02/19/2013  . Diabetes type 2, controlled ([place]) 12/03/2011  . HYPERTENSION, BENIGN ESSENTIAL 11/20/2007   s/p Procedure(s): CLOSURE OF ILEOSTOMY 03/27/2018  -Advance to full liquids - if tolerates well, ok for GI soft diet at dinner -D/C IVF -D/C Entereg -Ambulate 5x/day; out of bed to chair during day -PPx: SQH, SCDs -Dispo: Pending return of bowel function, toleration of diet, pain control on oral analgesics   LOS: 2 days   Sharon Mt. Dema Severin, M.D. General and Colorectal Surgery [facility]

## 2018-03-29 NOTE — Progress Notes (Signed)
I have reviewed and concur with this student's documentation.   

## 2018-03-29 NOTE — Plan of Care (Signed)
Patient in bed this morning. Assisted up to bathroom and then back to recliner. No complaints or concerns noted at this time. Will continue to monitor.

## 2018-03-30 NOTE — Progress Notes (Signed)
Subjective Tolerated full liquids yesterday - denies n/v. Had some bites of solid food at dinner as well. Soreness at ileostomy closure site. Ambulating without difficulty. 2 BMs yesterday - reports good control with some urgency as would be expected.  Objective: Vital signs in last 24 hours: Temp:  [97.6 F (36.4 C)-98.3 F (36.8 C)] 98.1 F (36.7 C) (10/14 0523) Pulse Rate:  [71-97] 97 (10/14 0523) Resp:  [16-18] 18 (10/14 0523) BP: (117-126)/(64-77) 123/77 (10/14 0523) SpO2:  [97 %-99 %] 98 % (10/14 0523) Weight:  [63 kg] 63 kg (10/13 1000) Last BM Date: 03/29/18  Intake/Output from previous day: 10/13 0701 - 10/14 0700 In: 530.1 [P.O.:240; I.V.:290.1] Out: -  Intake/Output this shift: No intake/output data recorded.  Gen: NAD, comfortable CV: RRR Pulm: Normal work of breathing Abd: Soft, mildly ttp around prior stoma site, none elsewhere. Ileostomy site site clean. Abdomen nondistended. No rebound. No guarding Ext: SCDs in place  Lab Results: CBC  Recent Labs    03/28/18 0436 03/29/18 0640  WBC 15.5* 9.4  HGB 13.0 12.5  HCT 38.6 38.3  PLT 241 221   BMET Recent Labs    03/28/18 0436 03/29/18 0640  NA 140 140  K 4.4 3.9  CL 101 101  CO2 28 30  GLUCOSE 98 92  BUN 11 10  CREATININE 0.65 0.58  CALCIUM 9.3 9.2   PT/INR No results for input(s): LABPROT, INR in the last 72 hours. ABG No results for input(s): PHART, HCO3 in the last 72 hours.  Invalid input(s): PCO2, PO2  Studies/Results:  Anti-infectives: Anti-infectives (From admission, onward)   Start     Dose/Rate Route Frequency Ordered Stop   03/27/18 0600  gentamicin (GARAMYCIN) 320 mg in dextrose 5 % 100 mL IVPB     320 mg 108 mL/hr over 60 Minutes Intravenous On call to O.R. 03/26/18 1446 03/27/18 0752   03/27/18 0600  clindamycin (CLEOCIN) IVPB 900 mg     900 mg 100 mL/hr over 30 Minutes Intravenous On call to O.R. 03/26/18 1446 03/27/18 0750       Assessment/Plan: Patient Active  Problem List   Diagnosis Date Noted  . Ileostomy status ([place]) 03/27/2018  . Healthcare maintenance 01/18/2018  . Pyelonephritis 09/04/2017  . Hyperlipidemia   . GERD (gastroesophageal reflux disease)   . Chronic back pain   . Weakness   . 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 S/p colostomy takedown with protective loop ileostomy 08/08/2017 08/11/2017  . Physical deconditioning 10/02/2016  . Rectal cancer ([place]) 10/01/2016  . Anemia of chronic disease 09/27/2016  . 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 Sinus tachycardia 12/16/2013  . Cervical radiculopathy due to degenerative joint disease of spine 02/19/2013  . Diabetes type 2, controlled ([place]) 12/03/2011  . HYPERTENSION, BENIGN ESSENTIAL 11/20/2007   s/p Procedure(s): CLOSURE OF ILEOSTOMY 03/27/2018  -Diet as tolerated today -Ambulate 5x/day; out of bed to chair during day -PPx: SQH, SCDs -Dispo: If reliably tolerates solids today and pain well controlled on oral analgesia, will plan discharge home tomorrow   LOS: 3 days   Sharon Mt. Dema Severin, M.D. General and Colorectal Surgery [facility]

## 2018-03-31 MED ORDER — PSYLLIUM 95 % PO PACK
1.0000 | PACK | Freq: Two times a day (BID) | ORAL | Status: DC
  Administered 2018-03-31 – 2018-04-01 (×3): 1 via ORAL
  Filled 2018-03-31 (×3): qty 1

## 2018-03-31 MED ORDER — LACTATED RINGERS IV BOLUS
500.0000 mL | Freq: Once | INTRAVENOUS | Status: AC
  Administered 2018-03-31: 500 mL via INTRAVENOUS

## 2018-03-31 NOTE — Progress Notes (Signed)
Subjective Tolerated solid foods well - denies n/v. Mild soreness at stoma closure site; denies any pain elsewhere. Ambulating without difficulty. Multiple BMs yesterday - reports good control with some urgency. Thinks she may be dehydrated from the amount of bowel movements as her thirst has increased.  Objective: Vital signs in last 24 hours: Temp:  [98.7 F (37.1 C)-99.1 F (37.3 C)] 99.1 F (37.3 C) (10/15 0510) Pulse Rate:  [70-112] 112 (10/15 0510) Resp:  [17-98] 17 (10/14 2103) BP: (117-132)/(65-87) 121/71 (10/15 0510) SpO2:  [96 %-98 %] 96 % (10/15 0510) Weight:  [63 kg-63.6 kg] 63.6 kg (10/15 0500) Last BM Date: 03/31/18  Intake/Output from previous day: 10/14 0701 - 10/15 0700 In: 1065 [P.O.:1065] Out: 1575 [Urine:1475; Stool:100] Intake/Output this shift: Total I/O In: -  Out: 200 [Urine:200]  Gen: NAD, comfortable CV: RRR Pulm: Normal work of breathing Abd: Soft, mildly ttp around prior stoma site, none elsewhere. Ileostomy site site clean. Abdomen nondistended. No rebound. No guarding Ext: SCDs in place  Lab Results: CBC  Recent Labs    03/29/18 0640  WBC 9.4  HGB 12.5  HCT 38.3  PLT 221   BMET Recent Labs    03/29/18 0640  NA 140  K 3.9  CL 101  CO2 30  GLUCOSE 92  BUN 10  CREATININE 0.58  CALCIUM 9.2   PT/INR No results for input(s): LABPROT, INR in the last 72 hours. ABG No results for input(s): PHART, HCO3 in the last 72 hours.  Invalid input(s): PCO2, PO2  Studies/Results:  Anti-infectives: Anti-infectives (From admission, onward)   Start     Dose/Rate Route Frequency Ordered Stop   03/27/18 0600  gentamicin (GARAMYCIN) 320 mg in dextrose 5 % 100 mL IVPB     320 mg 108 mL/hr over 60 Minutes Intravenous On call to O.R. 03/26/18 1446 03/27/18 0752   03/27/18 0600  clindamycin (CLEOCIN) IVPB 900 mg     900 mg 100 mL/hr over 30 Minutes Intravenous On call to O.R. 03/26/18 1446 03/27/18 0750       Assessment/Plan: Patient  Active Problem List   Diagnosis Date Noted  . Ileostomy status ([place]) 03/27/2018  . Healthcare maintenance 01/18/2018  . Pyelonephritis 09/04/2017  . Hyperlipidemia   . GERD (gastroesophageal reflux disease)   . Chronic back pain   . Weakness   . 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 S/p colostomy takedown with protective loop ileostomy 08/08/2017 08/11/2017  . Physical deconditioning 10/02/2016  . Rectal cancer ([place]) 10/01/2016  . Anemia of chronic disease 09/27/2016  . 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 Sinus tachycardia 12/16/2013  . Cervical radiculopathy due to degenerative joint disease of spine 02/19/2013  . Diabetes type 2, controlled ([place]) 12/03/2011  . HYPERTENSION, BENIGN ESSENTIAL 11/20/2007   s/p Procedure(s): CLOSURE OF ILEOSTOMY 03/27/2018  -Diet as tolerated -Tachycardia - low 100s on my exam this morning; will get EKG -Increased frequency of BMs - will restart her metamucil, BID; 500cc bolus -Ambulate 5x/day; out of bed to chair during day -PPx: SQH, SCDs -Dispo: Monitor today; possible d/c tomorrow if well, HR normal, tolerating diet   LOS: 4 days   Sharon Mt. Dema Severin, M.D. General and Colorectal Surgery [facility]

## 2018-04-01 MED ORDER — TRAMADOL HCL 50 MG PO TABS: 50.0000 mg | ORAL_TABLET | Freq: Four times a day (QID) | ORAL | 0 refills | Status: AC | PRN

## 2018-04-01 NOTE — Discharge Instructions (Signed)
POST OP INSTRUCTIONS  1. DIET: As tolerated. Avoid raw/uncooked fruits and vegetables for the first 4 weeks. Ok to have these but make sure they are cooked until soft enough to mash on the roof of your mouth and that you chew your food very well  2. Take your usually prescribed home medications unless otherwise directed.  3. PAIN CONTROL: a. Pain is best controlled by a usual combination of three different methods TOGETHER: i. Ice/Heat ii. Over the counter pain medication iii. Prescription pain medication b. Most patients will experience some swelling and bruising around the surgical site.  Ice packs or heating pads (30-60 minutes up to 6 times a day) will help. Some people prefer to use ice alone, heat alone, alternating between ice & heat.  Experiment to what works for you.  Swelling and bruising can take several weeks to resolve.   c. It is helpful to take an over-the-counter pain medication regularly for the first few weeks: i. Ibuprofen (Motrin/Advil) - 200mg  tabs - take 3 tabs (600mg ) every 6 hours as needed for pain ii. Acetaminophen (Tylenol) - you may take 650mg  every 6 hours as needed. You can take this with motrin as they act differently on the body. If you are taking a narcotic pain medication that has acetaminophen in it, do not take over the counter tylenol at the same time.  Iii. NOTE: You may take both of these medications together - most patients  find it most helpful when alternating between the two (i.e. Ibuprofen at 6am,  tylenol at 9am, ibuprofen at 12pm ...) d. A  prescription for pain medication should be given to you upon discharge.  Take your pain medication as prescribed if your pain is not adequatly controlled with the over-the-counter pain reliefs mentioned above.  4. Avoid getting constipated.  Between the surgery and the pain medications, it is common to experience some constipation.  Increasing fluid intake and taking a fiber supplement (such as Metamucil, Citrucel,  FiberCon, MiraLax, etc) 1-2 times a day regularly will usually help prevent this problem from occurring.  A mild laxative (prune juice, Milk of Magnesia, MiraLax, etc) should be taken according to package directions if there are no bowel movements after 48 hours.    5. Dressing: You may cover the ileostomy site with dry gauze to protect your clothing. You may shower normally with soap and water in the wound.  Avoid baths/pools/lakes/oceans until your wounds have fully healed.  6. ACTIVITIES as tolerated:   a. Avoid heavy lifting (>10lbs or 1 gallon of milk) for the next 6 weeks. b. You may resume regular (light) daily activities beginning the next day--such as daily self-care, walking, climbing stairs--gradually increasing activities as tolerated.  If you can walk 30 minutes without difficulty, it is safe to try more intense activity such as jogging, treadmill, bicycling, low-impact aerobics.  c. DO NOT PUSH THROUGH PAIN.  Let pain be your guide: If it hurts to do something, don't do it. d. Dennis Bast may drive when you are no longer taking prescription pain medication, you can comfortably wear a seatbelt, and you can safely maneuver your car and apply brakes.  7. FOLLOW UP in our office a. Please call [facility] at [phone] to set up an appointment to see your surgeon in the office for a follow-up appointment approximately 2 weeks after your surgery. b. Make sure that you call for this appointment the day you arrive home to insure a convenient appointment time.  9. If you have  disability or family leave forms that need to be completed, you may have them completed by your primary care physician's office; for return to work instructions, please ask our office staff and they will be happy to assist you in obtaining this documentation   When to call us [phone]: 1. Poor pain control 2. Reactions / problems with new medications (rash/itching, etc)  3. Fever over 101.5 F (38.5 C) 4. Inability to  urinate 5. Nausea/vomiting 6. Worsening swelling or bruising 7. Continued bleeding from incision. 8. Increased pain, redness, or drainage from the incision  The clinic staff is available to answer your questions during regular business hours (8:30am-5pm).  Please dont hesitate to call and ask to speak to one of our nurses for clinical concerns.   A surgeon from [facility] is always on call at the hospitals   If you have a medical emergency, go to the nearest emergency room or call 911.  [facility], [place] [address] [place], [place], [place]  [address] MAIN: [phone] FAX: [phone] [email or web address]

## 2018-04-01 NOTE — Progress Notes (Signed)
Subjective Tolerated solid foods well - denies n/v. Mild soreness at stoma closure site; denies any pain elsewhere. Ambulating without difficulty. Multiple BMs - reports good control with some urgency. Ready to go home today.  Objective: Vital signs in last 24 hours: Temp:  [97.5 F (36.4 C)-98.8 F (37.1 C)] 97.8 F (36.6 C) (10/16 0903) Pulse Rate:  [85-93] 93 (10/16 0903) Resp:  [13-18] 18 (10/16 0903) BP: (123-138)/(66-76) 124/76 (10/16 0903) SpO2:  [98 %-100 %] 98 % (10/16 0519) Weight:  [64.7 kg-64.8 kg] 64.7 kg (10/16 0523) Last BM Date: 04/01/18  Intake/Output from previous day: 10/15 0701 - 10/16 0700 In: 840.5 [P.O.:840; IV Piggyback:0.5] Out: 600 [Urine:600] Intake/Output this shift: No intake/output data recorded.  Gen: NAD, comfortable CV: RRR Pulm: Normal work of breathing Abd: Soft, mildly ttp around prior stoma site, none elsewhere. Ileostomy site site clean. Abdomen nondistended. No rebound. No guarding Ext: SCDs in place  Lab Results: CBC  No results for input(s): WBC, HGB, HCT, PLT in the last 72 hours. BMET No results for input(s): NA, K, CL, CO2, GLUCOSE, BUN, CREATININE, CALCIUM in the last 72 hours. PT/INR No results for input(s): LABPROT, INR in the last 72 hours. ABG No results for input(s): PHART, HCO3 in the last 72 hours.  Invalid input(s): PCO2, PO2  Studies/Results:  Anti-infectives: Anti-infectives (From admission, onward)   Start     Dose/Rate Route Frequency Ordered Stop   03/27/18 0600  gentamicin (GARAMYCIN) 320 mg in dextrose 5 % 100 mL IVPB     320 mg 108 mL/hr over 60 Minutes Intravenous On call to O.R. 03/26/18 1446 03/27/18 0752   03/27/18 0600  clindamycin (CLEOCIN) IVPB 900 mg     900 mg 100 mL/hr over 30 Minutes Intravenous On call to O.R. 03/26/18 1446 03/27/18 0750       Assessment/Plan: Patient Active Problem List   Diagnosis Date Noted  . Ileostomy status ([place]) 03/27/2018  . Healthcare maintenance 01/18/2018   . Pyelonephritis 09/04/2017  . Hyperlipidemia   . GERD (gastroesophageal reflux disease)   . Chronic back pain   . Weakness   . 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 S/p colostomy takedown with protective loop ileostomy 08/08/2017 08/11/2017  . Physical deconditioning 10/02/2016  . Rectal cancer ([place]) 10/01/2016  . Anemia of chronic disease 09/27/2016  . 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 Sinus tachycardia 12/16/2013  . Cervical radiculopathy due to degenerative joint disease of spine 02/19/2013  . Diabetes type 2, controlled ([place]) 12/03/2011  . HYPERTENSION, BENIGN ESSENTIAL 11/20/2007   s/p Procedure(s): CLOSURE OF ILEOSTOMY 03/27/2018  -Diet as tolerated -Tachycardia resolved - unclear why this occurred, possibly was behind on fluids? She is drinking much more now. -Continue BID metamucil -Ambulate 5x/day; out of bed to chair during day -PPx: SQH, SCDs -Dispo: Discharge home today   LOS: 5 days   Sharon Mt. Dema Severin, M.D. General and Colorectal Surgery [facility]

## 2018-04-02 NOTE — Discharge Summary (Signed)
Patient ID: Wanda Baker MRN: [id number] DOB/AGE: 58-Aug-1962 58 y.o.  Admit date: 03/27/2018 Discharge date: 04/02/2018  Discharge Diagnoses Patient Active Problem List   Diagnosis Date Noted  . Ileostomy status ([place]) 03/27/2018  . Healthcare maintenance 01/18/2018  . Pyelonephritis 09/04/2017  . Hyperlipidemia   . GERD (gastroesophageal reflux disease)   . Chronic back pain   . Weakness   . 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 S/p colostomy takedown with protective loop ileostomy 08/08/2017 08/11/2017  . Physical deconditioning 10/02/2016  . Rectal cancer ([place]) 10/01/2016  . Anemia of chronic disease 09/27/2016  . 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 Sinus tachycardia 12/16/2013  . Cervical radiculopathy due to degenerative joint disease of spine 02/19/2013  . Diabetes type 2, controlled ([place]) 12/03/2011  . HYPERTENSION, BENIGN ESSENTIAL 11/20/2007    Consultants None  Procedures Closure of ileostomy 03/27/18  Hospital Course: She underwent the above procedure without issue. She was admitted to the hospital postoperatively. She began having spontaneous bowel function on 03/28/18. Her diet as advanced. On 03/31/18, she was tolerating a regular diet, her pain was controlled on oral analgesics, she was having flatus and BMs, she was ambulating without issue and deemed stable for discharge home.   Allergies as of 04/01/2018      Reactions   Hydrocodone Other (See Comments)   Palpitations / dizziness   Nsaids Other (See Comments)   GERD "tears my stomach up"   Penicillins Swelling, Rash, Other (See Comments)   Has patient had a PCN reaction causing immediate rash, facial/tongue/throat swelling, SOB or lightheadedness with hypotension: Yes Has patient had a PCN reaction causing severe rash involving mucus membranes or skin necrosis: No Has patient had a PCN reaction that required hospitalization No Has patient  had a PCN reaction occurring within the last 10 years: No If all of the above answers are "NO", then may proceed with Cephalosporin use.   Prednisone Rash      Medication List    TAKE these medications   ALIVE ONCE DAILY WOMENS 50+ PO Take 1 tablet by mouth daily.   cetirizine 10 MG tablet Commonly known as:  ZYRTEC Take 1 tablet (10 mg total) by mouth at bedtime.   famotidine 20 MG tablet Commonly known as:  PEPCID TAKE 1 TABLET(20 MG) BY MOUTH TWICE DAILY What changed:  See the new instructions.   famotidine 20 MG tablet Commonly known as:  PEPCID TAKE 1 TABLET(20 MG) BY MOUTH TWICE DAILY What changed:  Another medication with the same name was changed. Make sure you understand how and when to take each.   famotidine 20 MG tablet Commonly known as:  PEPCID TAKE 1 TABLET(20 MG) BY MOUTH TWICE DAILY What changed:  Another medication with the same name was changed. Make sure you understand how and when to take each.   ferrous sulfate 325 (65 FE) MG tablet Take 1 tablet (325 mg total) by mouth 2 (two) times daily with a meal.   gabapentin 400 MG capsule Commonly known as:  NEURONTIN Take 1 capsule (400 mg total) by mouth 3 (three) times daily.   loperamide 2 MG capsule Commonly known as:  IMODIUM Take 2 mg by mouth daily as needed for diarrhea or loose stools.   metoprolol tartrate 50 MG tablet Commonly known as:  LOPRESSOR Take 2 tablets (100 mg total) by mouth daily.   psyllium 58.6 % packet Commonly known as:  METAMUCIL Take 1 packet  by mouth daily.   saccharomyces boulardii 250 MG capsule Commonly known as:  FLORASTOR Take 1 capsule (250 mg total) by mouth 2 (two) times daily.   traMADol 50 MG tablet Commonly known as:  ULTRAM Take 1 tablet (50 mg total) by mouth every 6 (six) hours as needed for up to 7 days (postop pain not controlled with tylenol/ibuprofen).   TYLENOL 8 HOUR ARTHRITIS PAIN 650 MG CR tablet Generic drug:  acetaminophen Take 1,300 mg by  mouth every 8 (eight) hours as needed for pain.       Sharon Mt. Dema Severin, M.D. [facility]

## 2018-04-06 ENCOUNTER — Other Ambulatory Visit: Payer: Self-pay

## 2018-04-06 DIAGNOSIS — I10 Essential (primary) hypertension: Secondary | ICD-10-CM

## 2018-04-06 MED ORDER — METOPROLOL TARTRATE 50 MG PO TABS: 100.0000 mg | ORAL_TABLET | Freq: Every day | ORAL | 3 refills | Status: DC

## 2018-04-23 ENCOUNTER — Ambulatory Visit
Admission: RE | Admit: 2018-04-23 | Discharge: 2018-04-23 | Disposition: A | Discharge status: Home / Self Care | Payer: Medicaid Other | Source: Ambulatory Visit | Attending: Pediatrics | Admitting: Pediatrics

## 2018-04-23 DIAGNOSIS — Z1231 Encounter for screening mammogram for malignant neoplasm of breast: Secondary | ICD-10-CM

## 2018-05-11 ENCOUNTER — Encounter: Payer: Self-pay | Admitting: Nurse Practitioner

## 2018-05-11 ENCOUNTER — Telehealth: Payer: Self-pay | Admitting: *Deleted

## 2018-05-11 ENCOUNTER — Inpatient Hospital Stay: Payer: Medicaid Other | Attending: Nurse Practitioner

## 2018-05-11 ENCOUNTER — Telehealth: Payer: Self-pay

## 2018-05-11 ENCOUNTER — Inpatient Hospital Stay (HOSPITAL_BASED_OUTPATIENT_CLINIC_OR_DEPARTMENT_OTHER): Payer: Medicaid Other | Admitting: Nurse Practitioner

## 2018-05-11 VITALS — BP 132/89 | HR 71 | Temp 97.0°F | Resp 19 | Ht 67.0 in | Wt 147.1 lb

## 2018-05-11 DIAGNOSIS — C20 Malignant neoplasm of rectum: Secondary | ICD-10-CM

## 2018-05-11 DIAGNOSIS — Z933 Colostomy status: Secondary | ICD-10-CM | POA: Insufficient documentation

## 2018-05-11 DIAGNOSIS — D509 Iron deficiency anemia, unspecified: Secondary | ICD-10-CM | POA: Insufficient documentation

## 2018-05-11 LAB — CEA (IN HOUSE-CHCC): CEA (CHCC-IN HOUSE): 1.41 ng/mL (ref 0.00–5.00)

## 2018-05-11 NOTE — Telephone Encounter (Signed)
Dr Silverio Decamp,  This pt had her Ileostomy closure 03-27-2018.  She has a colonoscopy scheduled for 12-19 Thursday.  I just want to be sure this  is okay to proceed as scheduled. She has a hx of rectal cancer and TA polyps- last colon 04-18-17, Flex 11-24-17.  [name] Kitchenplease advise,   Thanks so much for your time, Lelan Pons

## 2018-05-11 NOTE — Telephone Encounter (Signed)
Printed avs and calender of upcoming appointment. Per 11/25 los 

## 2018-05-11 NOTE — Progress Notes (Signed)
  Wanda Baker OFFICE PROGRESS NOTE   Diagnosis: Rectal cancer  INTERVAL HISTORY:   [name] returns as scheduled.  She feels well.  Bowels overall moving regularly.  No bleeding.  She denies abdominal pain.  No nausea or vomiting.  She has a good appetite.  Objective:  Vital signs in last 24 hours:  Blood pressure 132/89, pulse 71, temperature (!) 97 F (36.1 C), temperature source Oral, resp. rate 19, height '5\' 7"'$  (1.702 m), weight 147 lb 1.6 oz (66.7 kg), SpO2 100 %.    HEENT: Neck without mass. Lymphatics: No palpable cervical, supraclavicular, axillary or inguinal lymph nodes. Resp: Lungs clear bilaterally. Cardio: Regular rate and rhythm. GI: Abdomen soft and nontender.  No hepatomegaly.  Small scab at surgical incision right abdomen.  Appears to have 2 abdominal hernias, soft and reducible. Vascular: No leg edema.   Lab Results:  Lab Results  Component Value Date   WBC 9.4 03/29/2018   HGB 12.5 03/29/2018   HCT 38.3 03/29/2018   MCV 88.9 03/29/2018   PLT 221 03/29/2018   NEUTROABS 6.8 03/29/2018    Imaging:  No results found.  Medications: I have reviewed the patient's current medications.  Assessment/Plan: 1. 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 Tumor noted at 15 cm on sigmoidoscopy 09/17/2016 ? CT abdomen/pelvis 09/16/2016-colon obstruction, mass at the "distal sigmoid colon " ? Elevated preoperative CEA ? MSI-stable, no loss of mismatch repair protein expression ? 0/22 lymph nodes positive, extracellular mucin without tumor on the inked serosal surface ? Initiation of Xeloda/radiation 11/21/2016; completion of Xeloda/radiation 12/31/2016 ? Cycle 1 adjuvant Xeloda 01/27/2017 ? Cycle 2 adjuvant Xeloda 02/17/2017 ? Cycle 3 adjuvant Xeloda 03/10/2017 ? Cycle 4 adjuvant Xeloda 03/31/2017 (Xeloda dose escalated by  one tablet daily) ? Cycle 5 adjuvant Xeloda 04/21/2017 ? Colonoscopy 04/18/2017-tubular adenoma removed from the ascending colon ? Colostomy takedown and creation of an ileostomy 08/08/2017 ? Takedown of diverting ileostomy 03/27/2018  2. History of a colonic obstruction secondary to #1  3. Iron deficiency anemia secondary to #1. Resolved   Disposition: Wanda Baker appears well.  She remains in clinical remission from rectal cancer.  We will follow-up on the CEA from today.  She will return for a CEA and follow-up visit in 6 months.  She will contact the office in the interim with any problems.  She will discuss the hernias with Dr. Dema Severin.  Plan reviewed with Dr. Benay Spice.    Ned Card ANP/GNP-BC   05/11/2018  8:55 AM

## 2018-05-12 ENCOUNTER — Telehealth: Payer: Self-pay | Admitting: *Deleted

## 2018-05-12 NOTE — Telephone Encounter (Signed)
-----   Message from Owens Shark, NP sent at 05/12/2018  8:34 AM EST ----- Please let her know CEA is normal. Follow up as scheduled.

## 2018-05-12 NOTE — Telephone Encounter (Signed)
Notified patient that her CEA is normal.

## 2018-05-18 NOTE — Telephone Encounter (Signed)
Please advise patient to check with surgeon, if ok to proceed or not given it will be only 8 weeks post op. Thanks

## 2018-05-18 NOTE — Telephone Encounter (Signed)
Patient notified of Dr.Nandigams recommendations. She states she is waiting to here back from the surgeons office now because she has developed a hernia. So she will okay this colonoscopy and she will call us back today or tomorrow.

## 2018-05-19 NOTE — Telephone Encounter (Signed)
Pt called and said she has left message with Dr. Orest Dikes office and waiting on call back.  If she has not heard from there office by Thursday she will call and reschedule

## 2018-05-20 ENCOUNTER — Ambulatory Visit (HOSPITAL_COMMUNITY)
Admission: EM | Admit: 2018-05-20 | Discharge: 2018-05-20 | Disposition: A | Discharge status: Home / Self Care | Payer: Medicaid Other | Attending: Family Medicine | Admitting: Family Medicine

## 2018-05-20 ENCOUNTER — Encounter (HOSPITAL_COMMUNITY): Payer: Self-pay | Admitting: Emergency Medicine

## 2018-05-20 DIAGNOSIS — N3090 Cystitis, unspecified without hematuria: Secondary | ICD-10-CM | POA: Diagnosis not present

## 2018-05-20 LAB — POCT URINALYSIS DIP (DEVICE)
Bilirubin Urine: NEGATIVE
Glucose, UA: NEGATIVE mg/dL
Ketones, ur: NEGATIVE mg/dL
NITRITE: POSITIVE — AB
PROTEIN: NEGATIVE mg/dL
Specific Gravity, Urine: 1.02 (ref 1.005–1.030)
UROBILINOGEN UA: 0.2 mg/dL (ref 0.0–1.0)
pH: 7 (ref 5.0–8.0)

## 2018-05-20 MED ORDER — NITROFURANTOIN MONOHYD MACRO 100 MG PO CAPS: 100.0000 mg | ORAL_CAPSULE | Freq: Two times a day (BID) | ORAL | 0 refills | Status: AC

## 2018-05-20 NOTE — Discharge Instructions (Addendum)
Urine showed signs of UTI.   Urine culture sent.   Push fluids and get plenty of rest.   Prescribed macrobid, take as directed and to completion  Follow up with PCP in 1-2 weeks to ensure urine does not show signs of UTI Return here or go to ER if you have any new or worsening symptoms such as fever, worsening abdominal pain, nausea/vomiting, flank pain, etc..[name] Kitchen

## 2018-05-20 NOTE — ED Provider Notes (Signed)
[facility]   CC: UTI symptoms  SUBJECTIVE:  Wanda Baker is a 57 y.o. female hx significant for colorectal cancer, now in remission, who complains of urinary frequency, and dysuria that began yesterday.  Patient denies a precipitating event, recent sexual encounter, excessive caffeine intake. Denies associated abdominal or flank pain. Has NOT tried OTC medications.  Symptoms are made worse with urination.  Admits to similar symptoms in the past.  Denies fever, chills, nausea, vomiting, abdominal pain, flank pain, abnormal vaginal discharge or bleeding, hematuria.   LMP: No LMP recorded. Patient has had a hysterectomy.  ROS: As in HPI.  Past Medical History:  Diagnosis Date  . Adenocarcinoma of colon ([place]) 09/23/2016  . Allergy    uses Nasocort daily and takes Singulair nightly   . Arthritis   . Chronic back pain    buldging disc  . Diabetes mellitus without complication (HCC)    diet controlled  . DJD (degenerative joint disease)    whole spine  . GERD (gastroesophageal reflux disease)    takes Omeprazole daily   . History of bronchitis    many yrs ago  . Hyperlipidemia    takes Atorvastatin daily  . Hypertension   . 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 Weakness    left arm resolved now   Past Surgical History:  Procedure Laterality Date  . ABDOMINAL HYSTERECTOMY  2011   Supracervical Hysterectomy complete  . ANTERIOR CERVICAL DECOMP/DISCECTOMY FUSION N/A 12/02/2013   Procedure: ANTERIOR CERVICAL DECOMPRESSION/DISCECTOMY FUSION 3 LEVELS;  Surgeon: Erline Levine, MD;  Location: [facility];  Service: Neurosurgery;  Laterality: N/A;  C4-5 C5-6 C6-7 Anterior cervical decompression/diskectomy/fusion  . BOWEL RESECTION N/A 09/18/2016   Procedure: LOW ANTERIOR COLON RESECTION WITH COLOSTOMY;  Surgeon: Fanny Skates, MD;  Location: [facility];  Service: General;  Laterality: N/A;  . BOWEL RESECTION  08/08/2017   Procedure: SMALL  BOWEL RESECTION x 2;  Surgeon: Ileana Roup, MD;  Location: [facility];  Service: General;;  . CESAREAN SECTION  86/88  . COLOSTOMY TAKEDOWN N/A 08/08/2017   Procedure: HARTMANNS TAKEDOWN OF COLOSTOMY WITH COLORECTAL ANASTOMOIS ERAS PATHWAY;  Surgeon: Ileana Roup, MD;  Location: [facility];  Service: General;  Laterality: N/A;  . CYSTOSCOPY W/ URETERAL STENT PLACEMENT N/A 08/08/2017   Procedure: CYSTOSCOPY WITH URETERAL CATHETERS;  Surgeon: Ardis Hughs, MD;  Location: [facility];  Service: Urology;  Laterality: N/A;  . FLEXIBLE SIGMOIDOSCOPY N/A 09/17/2016   Procedure: FLEXIBLE SIGMOIDOSCOPY;  Surgeon: Mauri Pole, MD;  Location: [facility];  Service: Endoscopy;  Laterality: N/A;  . FLEXIBLE SIGMOIDOSCOPY N/A 08/08/2017   Procedure: FLEXIBLE SIGMOIDOSCOPY;  Surgeon: Ileana Roup, MD;  Location: [facility];  Service: General;  Laterality: N/A;  . FLEXIBLE SIGMOIDOSCOPY N/A 11/24/2017   Procedure: FLEXIBLE SIGMOIDOSCOPY;  Surgeon: Ileana Roup, MD;  Location: [facility];  Service: General;  Laterality: N/A;  . ILEOSTOMY N/A 08/08/2017   Procedure: DIVERTIN  ILEOSTOMY;  Surgeon: Ileana Roup, MD;  Location: [facility];  Service: General;  Laterality: N/A;  . LAPAROSCOPY N/A 03/27/2018   Procedure: CLOSURE OF ILEOSTOMY;  Surgeon: Ileana Roup, MD;  Location: [facility];  Service: General;  Laterality: N/A;  . LAPAROTOMY N/A 08/08/2017   Procedure: EXPLORATORY LAPAROTOMY;  Surgeon: Ileana Roup, MD;  Location: [facility];  Service: General;  Laterality: N/A;  . LYSIS OF ADHESION  08/08/2017   Procedure: LYSIS OF ADHESION;  Surgeon: Ileana Roup,  MD;  Location: [facility];  Service: General;;  . RECTAL EXAM UNDER ANESTHESIA  11/24/2017   Procedure: RECTAL EXAM UNDER ANESTHESIA;  Surgeon: Ileana Roup, MD;  Location: [facility];  Service: General;;  . TUBAL LIGATION     Allergies  Allergen Reactions  . Hydrocodone Other (See Comments)     Palpitations / dizziness  . Nsaids Other (See Comments)    GERD "tears my stomach up"  . Penicillins Swelling, Rash and Other (See Comments)    Has patient had a PCN reaction causing immediate rash, facial/tongue/throat swelling, SOB or lightheadedness with hypotension: Yes Has patient had a PCN reaction causing severe rash involving mucus membranes or skin necrosis: No Has patient had a PCN reaction that required hospitalization No Has patient had a PCN reaction occurring within the last 10 years: No If all of the above answers are "NO", then may proceed with Cephalosporin use.   . Prednisone Rash   No current facility-administered medications on file prior to encounter.    Current Outpatient Medications on File Prior to Encounter  Medication Sig Dispense Refill  . acetaminophen (TYLENOL 8 HOUR ARTHRITIS PAIN) 650 MG CR tablet Take 1,300 mg by mouth every 8 (eight) hours as needed for pain.    . cetirizine (ZYRTEC) 10 MG tablet Take 1 tablet (10 mg total) by mouth at bedtime. 30 tablet 4  . famotidine (PEPCID) 20 MG tablet TAKE 1 TABLET(20 MG) BY MOUTH TWICE DAILY (Patient taking differently: Take 20 mg by mouth 2 (two) times daily. ) 180 tablet 0  . ferrous sulfate 325 (65 FE) MG tablet Take 1 tablet (325 mg total) by mouth 2 (two) times daily with a meal. 180 tablet 0  . gabapentin (NEURONTIN) 400 MG capsule Take 1 capsule (400 mg total) by mouth 3 (three) times daily. 120 capsule 2  . loperamide (IMODIUM) 2 MG capsule Take 2 mg by mouth daily as needed for diarrhea or loose stools.    . metoprolol tartrate (LOPRESSOR) 50 MG tablet Take 2 tablets (100 mg total) by mouth daily. 60 tablet 3  . Multiple Vitamins-Minerals (ALIVE ONCE DAILY WOMENS 50+ PO) Take 1 tablet by mouth daily.     . psyllium (METAMUCIL) 58.6 % packet Take 1 packet by mouth daily.    [name] Kitchen saccharomyces boulardii (FLORASTOR) 250 MG capsule Take 1 capsule (250 mg total) by mouth 2 (two) times daily. 60 capsule 0   Social  History   Socioeconomic History  . Marital status: Divorced    Spouse name: Not on file  . Number of children: Not on file  . Years of education: Not on file  . Highest education level: Not on file  Occupational History  . Not on file  Social Needs  . Financial resource strain: Not on file  . Food insecurity:    Worry: Not on file    Inability: Not on file  . Transportation needs:    Medical: Not on file    Non-medical: Not on file  Tobacco Use  . Smoking status: Former Smoker    Packs/day: 0.50    Years: 15.00    Pack years: 7.50    Types: Cigarettes  . Smokeless tobacco: Never Used  . Tobacco comment: quit smoking 2011  Substance and Sexual Activity  . Alcohol use: No  . Drug use: No  . Sexual activity: Never    Birth control/protection: Surgical  Lifestyle  . Physical activity:    Days per week: Not on file  Minutes per session: Not on file  . Stress: Not on file  Relationships  . Social connections:    Talks on phone: Not on file    Gets together: Not on file    Attends religious service: Not on file    Active member of club or organization: Not on file    Attends meetings of clubs or organizations: Not on file    Relationship status: Not on file  . Intimate partner violence:    Fear of current or ex partner: Not on file    Emotionally abused: Not on file    Physically abused: Not on file    Forced sexual activity: Not on file  Other Topics Concern  . Not on file  Social History Narrative   lives with adult sons and mother and father   divorced   39 YO son has autism   Family History  Problem Relation Age of Onset  . Heart disease Maternal Grandmother   . Diabetes Maternal Grandmother   . Hypertension Maternal Grandmother   . Heart disease Maternal Grandfather   . Heart disease Paternal Grandmother   . Diabetes Paternal Grandmother   . Alzheimer's disease Paternal Grandmother     OBJECTIVE:  Vitals:   05/20/18 1302  BP: 135/87  Pulse: 91    Resp: 16  Temp: 98.2 F (36.8 C)  SpO2: 100%   General appearance: AOx3 in no acute distress HEENT: NCAT.  Oropharynx clear.  Lungs: clear to auscultation bilaterally without adventitious breath sounds Heart: regular rate and rhythm.  Radial pulses 2+ symmetrical bilaterally Abdomen: soft; non-distended; no tenderness; bowel sounds present; no guarding Back: no CVA tenderness Extremities: no edema; symmetrical with no gross deformities Skin: warm and dry Neurologic: Ambulates from chair to exam table without difficulty Psychological: alert and cooperative; normal mood and affect  Labs Reviewed  POCT URINALYSIS DIP (DEVICE) - Abnormal; Notable for the following components:      Result Value   Hgb urine dipstick SMALL (*)    Nitrite POSITIVE (*)    Leukocytes, UA SMALL (*)    All other components within normal limits    ASSESSMENT & PLAN:  1. Cystitis     Meds ordered this encounter  Medications  . nitrofurantoin, macrocrystal-monohydrate, (MACROBID) 100 MG capsule    Sig: Take 1 capsule (100 mg total) by mouth 2 (two) times daily for 5 days.    Dispense:  10 capsule    Refill:  0    Order Specific Question:   Supervising Provider    Answer:   Raylene Everts [[id number]]   Urine showed signs of UTI.   Urine culture sent.   Push fluids and get plenty of rest.   Prescribed macrobid, take as directed and to completion  Follow up with PCP in 1-2 weeks to ensure urine does not show signs of UTI Return here or go to ER if you have any new or worsening symptoms such as fever, worsening abdominal pain, nausea/vomiting, flank pain, etc...  Outlined signs and symptoms indicating need for more acute intervention. Patient verbalized understanding. After Visit Summary given.     Lestine Box, PA-C 05/20/18 1338

## 2018-05-20 NOTE — Telephone Encounter (Signed)
Patient states she spoke with Dr.White's office yesterday and they told her she could have the colon procedure on 12.19.19

## 2018-05-20 NOTE — ED Triage Notes (Signed)
Pt c/o urinary frequency and dysuria since yesterday.

## 2018-05-20 NOTE — Telephone Encounter (Signed)
Will proceed as scheduled. Marie PV  

## 2018-05-21 ENCOUNTER — Ambulatory Visit: Payer: Medicaid Other

## 2018-05-22 ENCOUNTER — Ambulatory Visit (AMBULATORY_SURGERY_CENTER): Payer: Self-pay | Admitting: *Deleted

## 2018-05-22 VITALS — Ht 67.0 in | Wt 149.0 lb

## 2018-05-22 DIAGNOSIS — Z86010 Personal history of colonic polyps: Secondary | ICD-10-CM

## 2018-05-22 DIAGNOSIS — Z85038 Personal history of other malignant neoplasm of large intestine: Secondary | ICD-10-CM

## 2018-05-22 MED ORDER — NA SULFATE-K SULFATE-MG SULF 17.5-3.13-1.6 GM/177ML PO SOLN: 1.0000 | Freq: Once | ORAL | 0 refills | Status: AC

## 2018-05-22 NOTE — Progress Notes (Signed)
No egg or soy allergy known to patient  No issues with past sedation with any surgeries  or procedures, no intubation problems  No diet pills per patient No home 02 use per patient  No blood thinners per patient  Pt denies issues with constipation  No A fib or A flutter  EMMI video sent to pt's e mail - pt declined  Pt states Dr Dema Severin stated okay for colon 06-04-18- see TE with KN and pt

## 2018-06-04 ENCOUNTER — Ambulatory Visit (AMBULATORY_SURGERY_CENTER): Payer: Medicaid Other | Admitting: Gastroenterology

## 2018-06-04 ENCOUNTER — Encounter: Payer: Self-pay | Admitting: Gastroenterology

## 2018-06-04 VITALS — BP 111/61 | HR 69 | Temp 98.0°F | Resp 11 | Ht 67.0 in | Wt 147.0 lb

## 2018-06-04 DIAGNOSIS — Z1211 Encounter for screening for malignant neoplasm of colon: Secondary | ICD-10-CM

## 2018-06-04 DIAGNOSIS — Z85038 Personal history of other malignant neoplasm of large intestine: Secondary | ICD-10-CM | POA: Diagnosis not present

## 2018-06-04 HISTORY — PX: COLONOSCOPY: SHX174

## 2018-06-04 MED ORDER — SODIUM CHLORIDE 0.9 % IV SOLN: 500.0000 mL | Freq: Once | INTRAVENOUS | Status: DC

## 2018-06-04 NOTE — Progress Notes (Signed)
Pt's states no medical or surgical changes since previsit or office visit. 

## 2018-06-04 NOTE — Progress Notes (Signed)
To PACU, VSS. Report to [email or web address] 

## 2018-06-04 NOTE — Patient Instructions (Signed)
YOU HAD AN ENDOSCOPIC PROCEDURE TODAY AT THE [facility]:   Refer to the procedure report that was given to you for any specific questions about what was found during the examination.  If the procedure report does not answer your questions, please call your gastroenterologist to clarify.  If you requested that your care partner not be given the details of your procedure findings, then the procedure report has been included in a sealed envelope for you to review at your convenience later.  YOU SHOULD EXPECT: Some feelings of bloating in the abdomen. Passage of more gas than usual.  Walking can help get rid of the air that was put into your GI tract during the procedure and reduce the bloating. If you had a lower endoscopy (such as a colonoscopy or flexible sigmoidoscopy) you may notice spotting of blood in your stool or on the toilet paper. If you underwent a bowel prep for your procedure, you may not have a normal bowel movement for a few days.  Please Note:  You might notice some irritation and congestion in your nose or some drainage.  This is from the oxygen used during your procedure.  There is no need for concern and it should clear up in a day or so.  SYMPTOMS TO REPORT IMMEDIATELY:   Following lower endoscopy (colonoscopy or flexible sigmoidoscopy):  Excessive amounts of blood in the stool  Significant tenderness or worsening of abdominal pains  Swelling of the abdomen that is new, acute  Fever of 100F or higher   Following upper endoscopy (EGD)  Vomiting of blood or coffee ground material  New chest pain or pain under the shoulder blades  Painful or persistently difficult swallowing  New shortness of breath  Fever of 100F or higher  Black, tarry-looking stools  For urgent or emergent issues, a gastroenterologist can be reached at any hour by calling [phone].   DIET:  We do recommend a small meal at first, but then you may proceed to your regular diet.  Drink  plenty of fluids but you should avoid alcoholic beverages for 24 hours.  ACTIVITY:  You should plan to take it easy for the rest of today and you should NOT DRIVE or use heavy machinery until tomorrow (because of the sedation medicines used during the test).    FOLLOW UP: Our staff will call the number listed on your records the next business day following your procedure to check on you and address any questions or concerns that you may have regarding the information given to you following your procedure. If we do not reach you, we will leave a message.  However, if you are feeling well and you are not experiencing any problems, there is no need to return our call.  We will assume that you have returned to your regular daily activities without incident.  If any biopsies were taken you will be contacted by phone or by letter within the next 1-3 weeks.  Please call us at [phone] if you have not heard about the biopsies in 3 weeks.    SIGNATURES/CONFIDENTIALITY: You and/or your care partner have signed paperwork which will be entered into your electronic medical record.  These signatures attest to the fact that that the information above on your After Visit Summary has been reviewed and is understood.  Full responsibility of the confidentiality of this discharge information lies with you and/or your care-partner. 

## 2018-06-04 NOTE — Op Note (Addendum)
[facility] Patient Name: Wanda Baker Procedure Date: 06/04/2018 9:40 AM MRN: [id number] Endoscopist: Mauri Pole , MD Age: 57 Referring MD:  Date of Birth: 18-Nov-1960 Gender: Female Account #: [id number] Procedure:                Colonoscopy Indications:              High risk colon cancer surveillance: Personal                            history of colon cancer Medicines:                Monitored Anesthesia Care Procedure:                Pre-Anesthesia Assessment:                           - Prior to the procedure, a History and Physical                            was performed, and patient medications and                            allergies were reviewed. The patient's tolerance of                            previous anesthesia was also reviewed. The risks                            and benefits of the procedure and the sedation                            options and risks were discussed with the patient.                            All questions were answered, and informed consent                            was obtained. Prior Anticoagulants: The patient has                            taken no previous anticoagulant or antiplatelet                            agents. ASA Grade Assessment: III - A patient with                            severe systemic disease. After reviewing the risks                            and benefits, the patient was deemed in                            satisfactory condition to undergo the procedure.  After obtaining informed consent, the colonoscope                            was passed under direct vision. Throughout the                            procedure, the patient's blood pressure, pulse, and                            oxygen saturations were monitored continuously. The                            Colonoscope was introduced through the anus and                            advanced to the the cecum,  identified by                            appendiceal orifice and ileocecal valve. The                            colonoscopy was performed without difficulty. The                            patient tolerated the procedure well. The quality                            of the bowel preparation was adequate. The                            ileocecal valve, appendiceal orifice, and rectum                            were photographed. Scope In: 9:45:39 AM Scope Out: 9:59:37 AM Scope Withdrawal Time: 0 hours 11 minutes 10 seconds  Total Procedure Duration: 0 hours 13 minutes 58 seconds  Findings:                 The perianal and digital rectal examinations were                            normal.                           There was evidence of a prior functional end-to-end                            colo-colonic anastomosis in the rectum at 8 cm from                            anal verge. This was patent and was characterized                            by an intact staple line. The anastomosis was  traversed.                           The exam was otherwise without abnormality.                            Retroflexion not performed in the rectum due to                            narrow small rectal vault. Complications:            No immediate complications. Estimated Blood Loss:     Estimated blood loss was minimal. Impression:               - Patent functional end-to-end colo-colonic                            anastomosis, characterized by an intact staple line.                           - The examination was otherwise normal.                           - No specimens collected. Recommendation:           - Patient has a contact number available for                            emergencies. The signs and symptoms of potential                            delayed complications were discussed with the                            patient. Return to normal activities tomorrow.                             Written discharge instructions were provided to the                            patient.                           - Resume previous diet.                           - Continue present medications.                           - Await pathology results.                           - Repeat colonoscopy in 5 years for surveillance.                           - Return to GI clinic PRN. Mauri Pole, MD 06/04/2018 10:08:16 AM This report has been signed electronically.

## 2018-06-05 ENCOUNTER — Ambulatory Visit: Payer: Medicaid Other | Admitting: Family Medicine

## 2018-06-05 ENCOUNTER — Telehealth: Payer: Self-pay | Admitting: *Deleted

## 2018-06-05 ENCOUNTER — Encounter: Payer: Self-pay | Admitting: Family Medicine

## 2018-06-05 VITALS — BP 118/75 | HR 74 | Temp 97.6°F | Wt 147.0 lb

## 2018-06-05 DIAGNOSIS — Z0000 Encounter for general adult medical examination without abnormal findings: Secondary | ICD-10-CM

## 2018-06-05 DIAGNOSIS — R000 Tachycardia, unspecified: Secondary | ICD-10-CM | POA: Diagnosis not present

## 2018-06-05 DIAGNOSIS — Z23 Encounter for immunization: Secondary | ICD-10-CM

## 2018-06-05 DIAGNOSIS — I4891 Unspecified atrial fibrillation: Secondary | ICD-10-CM | POA: Insufficient documentation

## 2018-06-05 DIAGNOSIS — K432 Incisional hernia without obstruction or gangrene: Secondary | ICD-10-CM

## 2018-06-05 DIAGNOSIS — Z09 Encounter for follow-up examination after completed treatment for conditions other than malignant neoplasm: Secondary | ICD-10-CM

## 2018-06-05 NOTE — Assessment & Plan Note (Signed)
Patient with multiple incisional hernias located left lower quadrant and substernal.  Not causing any pain or discomfort.  Discussed alarm signs for patient.  To follow-up with surgery PRN

## 2018-06-05 NOTE — Progress Notes (Signed)
prevnar 

## 2018-06-05 NOTE — Assessment & Plan Note (Signed)
Prevnar 13 given

## 2018-06-05 NOTE — Patient Instructions (Addendum)
It was great seeing you today! I am glad that everything has been going well. I am glad that you have gotten a clean bill of health from your oncologist, surgeon, and gi doctor. Please keep those follow up appointments as they come.   We will give a pneumonia vaccine today due to your "high-risk" status. You will get the second version when you are 64. Please follow up with me in April for another diabetes check.

## 2018-06-05 NOTE — Telephone Encounter (Signed)
  Follow up Call-  Call back number 06/04/2018  Post procedure Call Back phone  # [phone]  Permission to leave phone message Yes  Some recent data might be hidden     Patient questions:  Do you have a fever, pain , or abdominal swelling? No. Pain Score  0 *  Have you tolerated food without any problems? Yes.    Have you been able to return to your normal activities? Yes.    Do you have any questions about your discharge instructions: Diet   No. Medications  No. Follow up visit  No.  Do you have questions or concerns about your Care? No.  Actions: * If pain score is 4 or above: No action needed, pain <4.

## 2018-06-05 NOTE — Progress Notes (Signed)
   HPI 57 year old female who presents for follow-up.  Patient was admitted on 03/27/2018 for closure of ileostomy.  She had ileostomy secondary to GI malignancy.  Patient underwent colonoscopy on 06/04/2018 which showed no polyps or masses.  Patient to follow-up with GI in 5 years.  Patient is to follow-up with surgery in March and oncology in April.  Patient states that she had episode of tachycardia while in the hospital.  Looking to the hospital notes her tachycardia appears unexplained.  He was sinus tachycardia by EKG.  Patient request cardiology eval.  Patient also overdue for foot exam, optho exam, Prevnar.  A1c back in October 5.5.  Foot exam and optic exam not indicated this time.  Patient interested in receiving Prevnar.  CC: Ileostomy closure follow-up   ROS:   Review of Systems See HPI for ROS.   CC, SH/smoking status, and VS noted  Objective: BP 118/75   Pulse 74   Temp 97.6 F (36.4 C) (Oral)   Wt 147 lb (66.7 kg)   SpO2 97%   BMI 23.02 kg/m  Gen: 57 year old Caucasian female, resting comfortably in bed no acute distress. CV: RRR, no murmur Resp: CTAB, no wheezes, non-labored Abd: Soft, nontender, nondistended.  Patient with large incisional hernia extending essentially from pelvis to sternum.  Notable large incisional hernia located at the very top of incision.  Cannot palpate defect.  Also large incisional hernia in left lateral quadrant.  Defect feels to be about 4 cm x 2 cm. Neuro: Alert and oriented, Speech clear, No gross deficits  Assessment and plan:  Follow-up examination following surgery No pain, constipation, nausea, vomiting from ileostomy reversal site.  Patient does have follow-up with surgery in March.  Incisional hernia, without obstruction or gangrene Patient with multiple incisional hernias located left lower quadrant and substernal.  Not causing any pain or discomfort.  Discussed alarm signs for patient.  To follow-up with surgery  PRN  Healthcare maintenance Prevnar 13 given.   Orders Placed This Encounter  Procedures  . Pneumococcal conjugate vaccine 13-valent  . Ambulatory referral to Cardiology    Referral Priority:   Routine    Referral Type:   Consultation    Referral Reason:   Specialty Services Required    Requested Specialty:   Cardiology    Number of Visits Requested:   1    No orders of the defined types were placed in this encounter.    [name] MD PGY-2 Family Medicine Resident  06/05/2018 10:52 AM

## 2018-06-05 NOTE — Assessment & Plan Note (Signed)
No pain, constipation, nausea, vomiting from ileostomy reversal site.  Patient does have follow-up with surgery in March.

## 2018-07-06 IMAGING — RF DG BE W/ CM - WO/W KUB
5 series · 14 of 24 positions shown · non-contrast
Comparison: Post ileostomy CT Abdomen and Pelvis 08/11/2017.

CLINICAL DATA: 56-year-old female with history of low anterior
resection with colostomy and Hartman's pouch in September 2016,
subsequent large bowel reanastomosis and creation of ileostomy in
July 2017. Presenting for ileostomy take-down planning.

EXAM:
WATER SOLUBLE CONTRAST ENEMA
TECHNIQUE: Gravity installation of water-soluble contrast per rectum.
FLUOROSCOPY TIME:  Fluoroscopy Time:  1 minutes 30 seconds
Radiation Exposure Index (if provided by the fluoroscopic device):
118 mGy
Number of Acquired Spot Images: 1

[Series 1: one shot · 2 of 3 slices shown (1 of 3)]
[im 1/3]
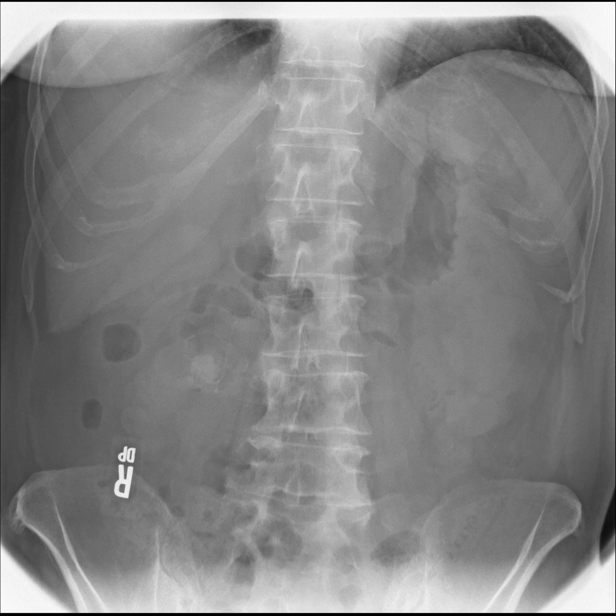
[im 3/3]
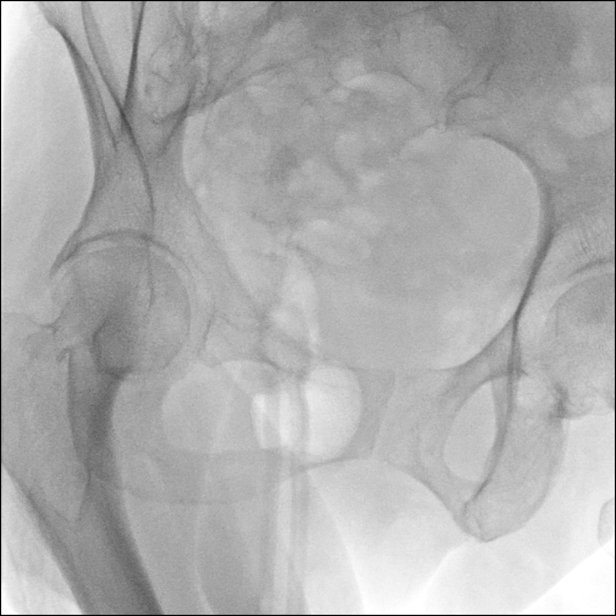

[Series 2: sequence · 1 of 17 frames shown (1 of 2)]
[frame 3/17]
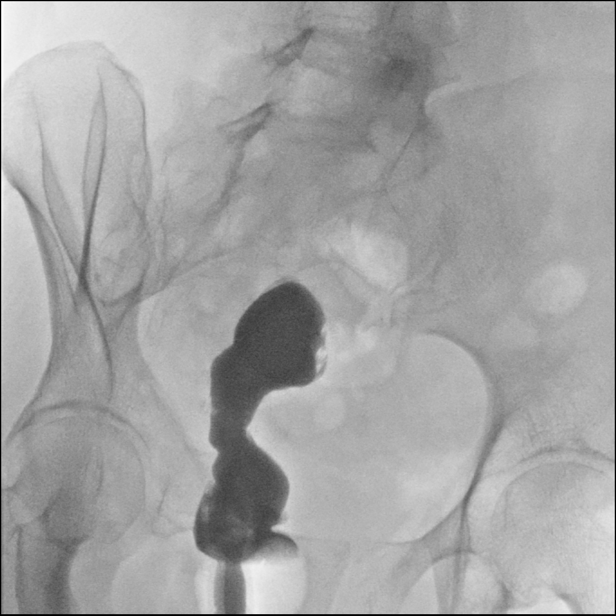

[Series 3: one shot · 8 of 14 slices shown (2 of 3)]
[im 1/14]
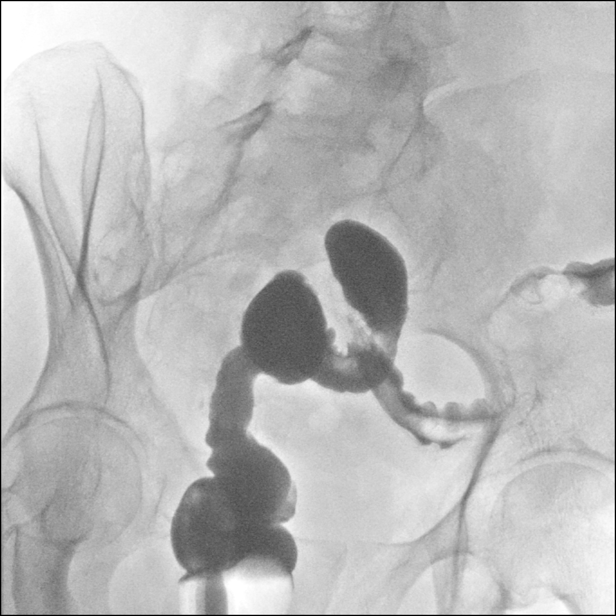
[im 2/14]
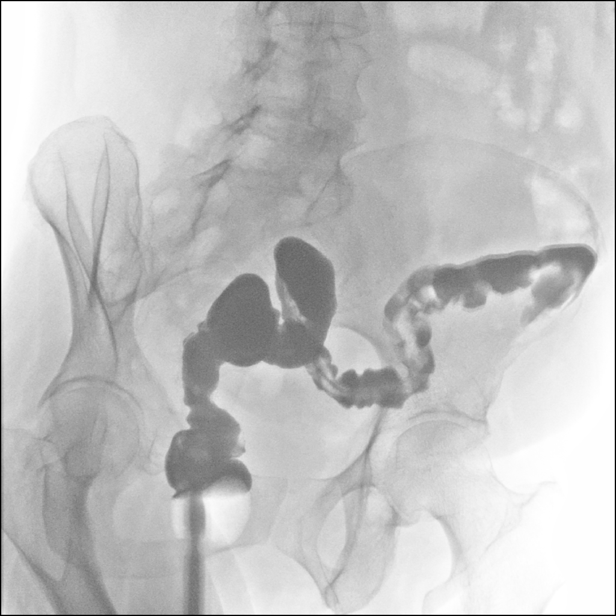
[im 4/14]
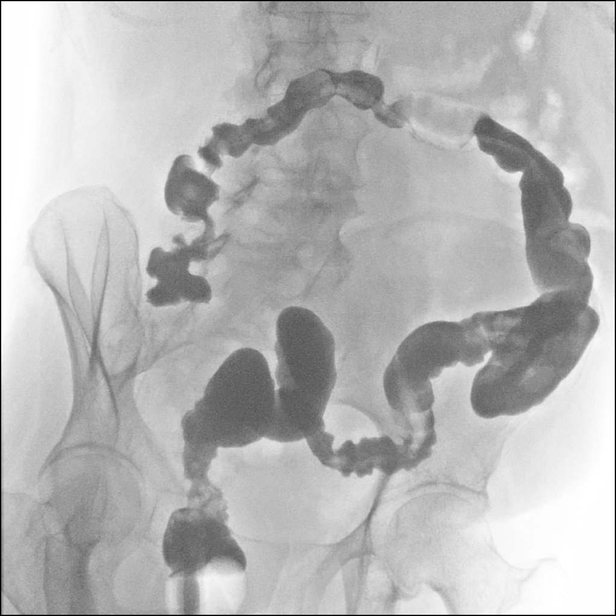
[im 6/14]
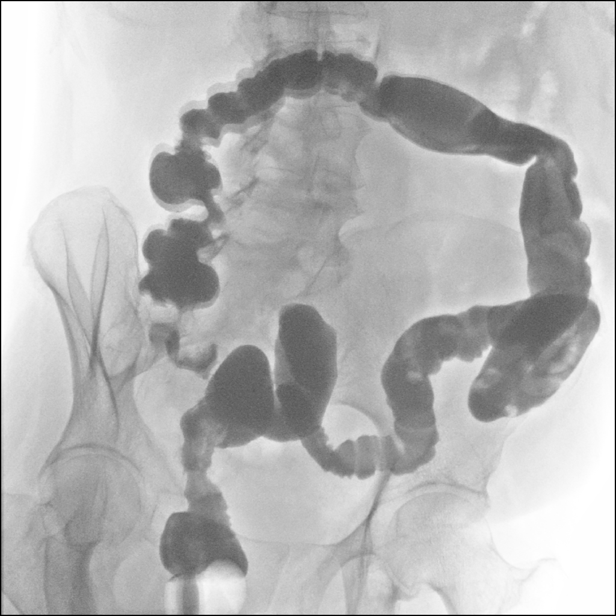
[im 7/14]
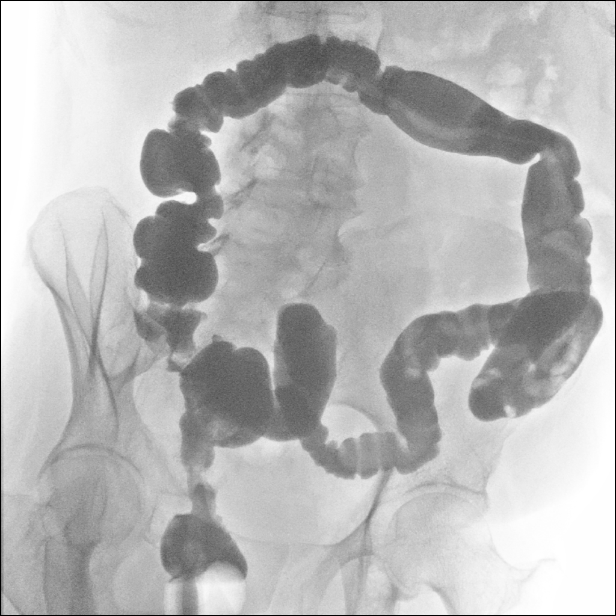
[im 9/14]
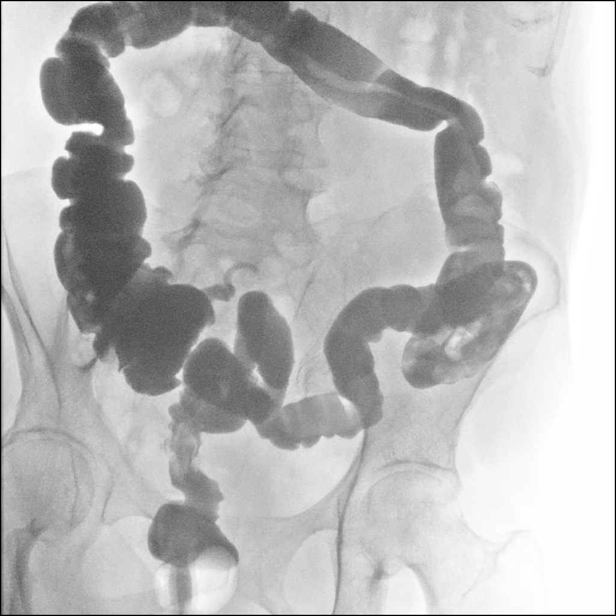
[im 11/14]
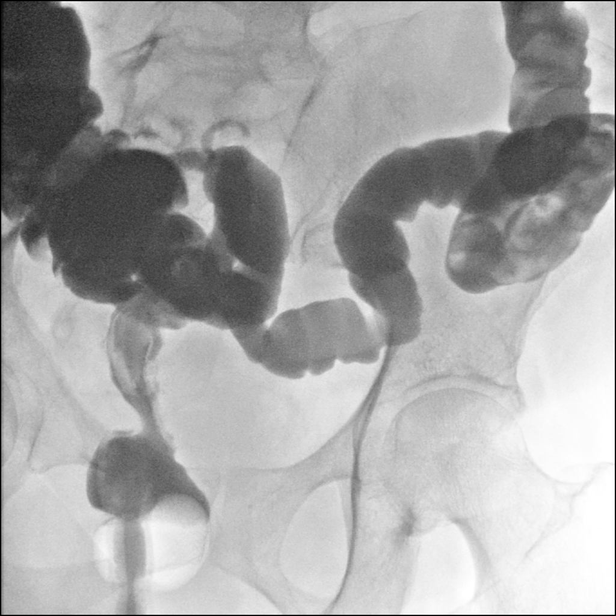
[im 14/14]
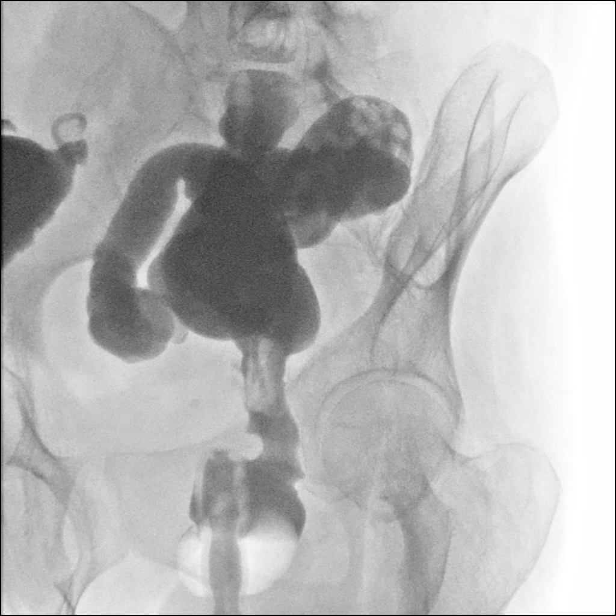

[Series 4: sequence · 2 of 17 frames shown (2 of 2)]
[frame 3/17]
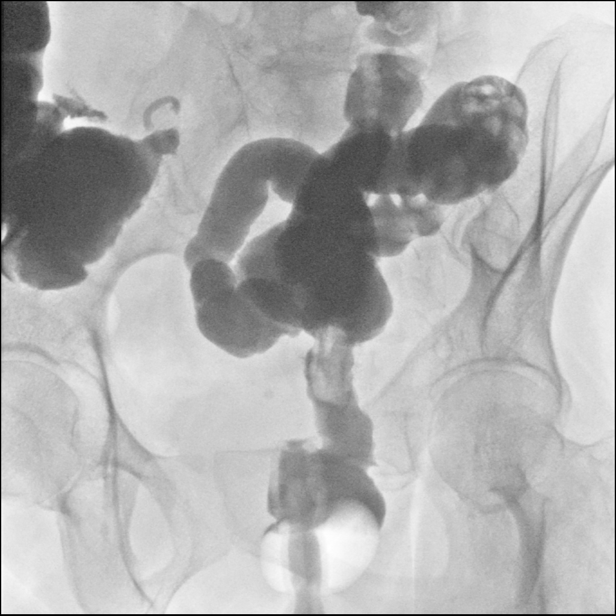
[frame 15/17]
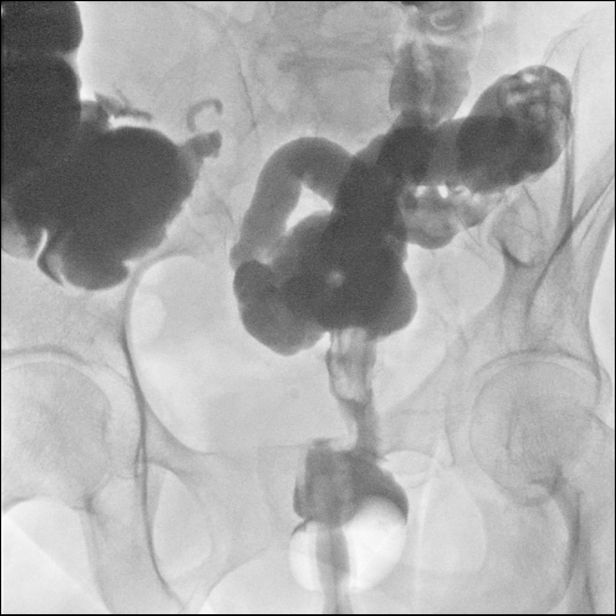

[Series 5: one shot · 1 of 2 slices shown (3 of 3)]
[im 2/2]
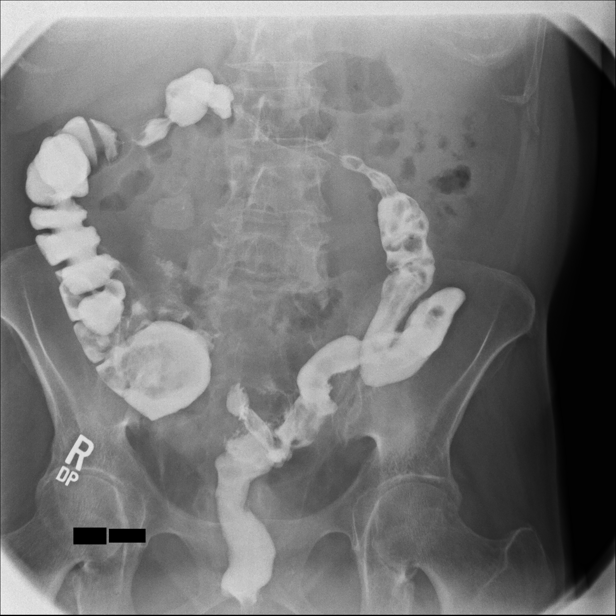

[14 of 24 positions shown; findings below may reference images not displayed]

Post
low anterior resection rectal/Hartmann's pouch enema 04/16/2017.
Preoperative CT Abdomen and Pelvis 09/16/2016.
FINDINGS: Two preprocedural scout views of the abdomen were obtained. A right
abdominal ileostomy is evident. A rectosigmoid anastomosis is
identified over the central pelvis. Nonobstructed bowel-gas pattern.
Negative visible lung bases. No acute osseous abnormality
identified.

The patient tolerated the exam with some discomfort.

No obstruction to the retrograde flow of contrast throughout the
large bowel, and contrast passed quickly from the rectum to the
cecum.

No adverse features at the rectosigmoid anastomosis (series 2). Mild
tortuosity at the junction of the sigmoid and descending colon.
Unremarkable transverse colon. Contrast fills the cecum and the
appendix.

There is early contrast reflux into the terminal ileum.

The large bowel was allowed to drain, and the patient use the
restroom. A post evacuation image demonstrates no contrast leak or
abnormal collection about the rectosigmoid anastomosis. There is
faint contrast reflux from the terminal ileum directed toward the
double barrel ileostomy.
IMPRESSION: Satisfactory appearance of the rectosigmoid anastomosis and large
bowel. No contrast leak or adverse features identified.

## 2018-07-22 ENCOUNTER — Encounter: Payer: Self-pay | Admitting: Cardiovascular Disease

## 2018-07-22 ENCOUNTER — Ambulatory Visit: Payer: Medicaid Other | Admitting: Cardiovascular Disease

## 2018-07-22 DIAGNOSIS — R000 Tachycardia, unspecified: Secondary | ICD-10-CM | POA: Diagnosis not present

## 2018-07-22 DIAGNOSIS — I10 Essential (primary) hypertension: Secondary | ICD-10-CM

## 2018-07-22 NOTE — Patient Instructions (Signed)
Medication Instructions:  NONE If you need a refill on your cardiac medications before your next appointment, please call your pharmacy.   Lab work: NONE If you have labs (blood work) drawn today and your tests are completely normal, you will receive your results only by: [name] Kitchen MyChart Message (if you have MyChart) OR . A paper copy in the mail If you have any lab test that is abnormal or we need to change your treatment, we will call you to review the results.  Testing/Procedures: NONE  Follow-Up: At [facility], you and your health needs are our priority.  As part of our continuing mission to provide you with exceptional heart care, we have created designated Provider Care Teams.  These Care Teams include your primary Cardiologist (physician) and Advanced Practice Providers (APPs -  Physician Assistants and Nurse Practitioners) who all work together to provide you with the care you need, when you need it. . You may schedule a follow up appointment AS NEEDED. You may see Dr. Gwenlyn Found or one of the following Advanced Practice Providers on your designated Care Team:   . Kerin Ransom, [place] . Almyra Deforest, PA-C . Fabian Sharp, PA-C . Jory Sims, DNP . Rosaria Ferries, PA-C . Roby Lofts, PA-C . Sande Rives, PA-C

## 2018-07-22 NOTE — Assessment & Plan Note (Signed)
History of essential hypertension her blood pressure measured today 136/80.  She is on Lopressor.

## 2018-07-22 NOTE — Assessment & Plan Note (Signed)
Wanda Baker was referred by Dr. Kris Mouton for evaluation of sinus tachycardia.  She has no cardiac risk factors other than treated hypertension.  She is on a low-dose beta-blocker.  She has had 3 abdominal surgeries for colon cancer and bowel obstruction as well as colostomy and ileostomy takedown.  In the preop.  She noted that she was somewhat tachycardic however this is not the case during her normal activity at home.  She denies chest pain or shortness of breath.

## 2018-07-22 NOTE — Progress Notes (Signed)
07/22/2018 [name]   August 06, 1960  [id number]  Primary Physician Wanda Dawn, MD Primary Cardiologist: Wanda Harp MD Wanda Baker, [place]  HPI:  Wanda Baker is a 58 y.o. moderately overweight divorced Caucasian female mother of 2 children, grandmother of 3 grandchildren is accompanied by her son Wanda Baker today.  Referred by Dr. Alvera Baker, her PCP, for evaluation of sinus tachycardia.  Her cardiac risk factor profile is notable for remote tobacco abuse having quit 10 years ago and smoked 10 pack years.  She has treated hypertension.  She is never had a heart attack or stroke.  She denies chest pain or shortness of breath.  There is no family history for heart disease.  She had colon cancer diagnosed 2 years ago and has had 3 abdominal surgeries for this.  She is noted to have sinus tachycardia while in the hospital during the preop.  But not while at home.   Current Meds  Medication Sig  . acetaminophen (TYLENOL 8 HOUR ARTHRITIS PAIN) 650 MG CR tablet Take 1,300 mg by mouth every 8 (eight) hours as needed for pain.  . cetirizine (ZYRTEC) 10 MG tablet Take 1 tablet (10 mg total) by mouth at bedtime.  . famotidine (PEPCID) 20 MG tablet TAKE 1 TABLET(20 MG) BY MOUTH TWICE DAILY (Patient taking differently: Take 20 mg by mouth 2 (two) times daily. )  . ferrous sulfate 325 (65 FE) MG tablet Take 1 tablet (325 mg total) by mouth 2 (two) times daily with a meal.  . gabapentin (NEURONTIN) 400 MG capsule Take 1 capsule (400 mg total) by mouth 3 (three) times daily.  [name] Kitchen loperamide (IMODIUM) 2 MG capsule Take 2 mg by mouth daily as needed for diarrhea or loose stools.  . metoprolol tartrate (LOPRESSOR) 50 MG tablet Take 2 tablets (100 mg total) by mouth daily.  . Multiple Vitamins-Minerals (ALIVE ONCE DAILY WOMENS 50+ PO) Take 1 tablet by mouth daily.   . psyllium (METAMUCIL) 58.6 % packet Take 1 packet by mouth daily.  [name] Kitchen saccharomyces boulardii (FLORASTOR) 250 MG capsule Take 1  capsule (250 mg total) by mouth 2 (two) times daily.     Allergies  Allergen Reactions  . Hydrocodone Other (See Comments)    Palpitations / dizziness  . Nsaids Other (See Comments)    GERD "tears my stomach up"  . Penicillins Swelling, Rash and Other (See Comments)    Has patient had a PCN reaction causing immediate rash, facial/tongue/throat swelling, SOB or lightheadedness with hypotension: Yes Has patient had a PCN reaction causing severe rash involving mucus membranes or skin necrosis: No Has patient had a PCN reaction that required hospitalization No Has patient had a PCN reaction occurring within the last 10 years: No If all of the above answers are "NO", then may proceed with Cephalosporin use.   . Prednisone Rash    Social History   Socioeconomic History  . Marital status: Divorced    Spouse name: Not on file  . Number of children: Not on file  . Years of education: Not on file  . Highest education level: Not on file  Occupational History  . Not on file  Social Needs  . Financial resource strain: Not on file  . Food insecurity:    Worry: Not on file    Inability: Not on file  . Transportation needs:    Medical: Not on file    Non-medical: Not on file  Tobacco Use  . Smoking status:  Former Smoker    Packs/day: 0.50    Years: 15.00    Pack years: 7.50    Types: Cigarettes  . Smokeless tobacco: Never Used  . Tobacco comment: quit smoking 2011  Substance and Sexual Activity  . Alcohol use: No  . Drug use: No  . Sexual activity: Never    Birth control/protection: Surgical  Lifestyle  . Physical activity:    Days per week: Not on file    Minutes per session: Not on file  . Stress: Not on file  Relationships  . Social connections:    Talks on phone: Not on file    Gets together: Not on file    Attends religious service: Not on file    Active member of club or organization: Not on file    Attends meetings of clubs or organizations: Not on file     Relationship status: Not on file  . Intimate partner violence:    Fear of current or ex partner: Not on file    Emotionally abused: Not on file    Physically abused: Not on file    Forced sexual activity: Not on file  Other Topics Concern  . Not on file  Social History Narrative   lives with adult sons and mother and father   divorced   46 YO son has autism     Review of Systems: General: negative for chills, fever, night sweats or weight changes.  Cardiovascular: negative for chest pain, dyspnea on exertion, edema, orthopnea, palpitations, paroxysmal nocturnal dyspnea or shortness of breath Dermatological: negative for rash Respiratory: negative for cough or wheezing Urologic: negative for hematuria Abdominal: negative for nausea, vomiting, diarrhea, bright red blood per rectum, melena, or hematemesis Neurologic: negative for visual changes, syncope, or dizziness All other systems reviewed and are otherwise negative except as noted above.    Blood pressure 136/80, pulse 81, height 5\' 7"  (1.702 m), weight 157 lb 3.2 oz (71.3 kg), SpO2 97 %.  General appearance: alert and no distress Neck: no adenopathy, no carotid bruit, no JVD, supple, symmetrical, trachea midline and thyroid not enlarged, symmetric, no tenderness/mass/nodules Lungs: clear to auscultation bilaterally Heart: regular rate and rhythm, S1, S2 normal, no murmur, click, rub or gallop Extremities: extremities normal, atraumatic, no cyanosis or edema Pulses: 2+ and symmetric Skin: Skin color, texture, turgor normal. No rashes or lesions Neurologic: Alert and oriented X 3, normal strength and tone. Normal symmetric reflexes. Normal coordination and gait  EKG not performed today  ASSESSMENT AND PLAN:   Heart rate fast Ms. Rosenburg was referred by Dr. Kris Baker for evaluation of sinus tachycardia.  She has no cardiac risk factors other than treated hypertension.  She is on a low-dose beta-blocker.  She has had 3 abdominal  surgeries for colon cancer and bowel obstruction as well as colostomy and ileostomy takedown.  In the preop.  She noted that she was somewhat tachycardic however this is not the case during her normal activity at home.  She denies chest pain or shortness of breath.  HYPERTENSION, BENIGN ESSENTIAL History of essential hypertension her blood pressure measured today 136/80.  She is on Lopressor.      Wanda Harp MD FACP,FACC,FAHA, [facility] 07/22/2018 11:08 AM

## 2018-08-03 ENCOUNTER — Ambulatory Visit (HOSPITAL_COMMUNITY)
Admission: EM | Admit: 2018-08-03 | Discharge: 2018-08-03 | Disposition: A | Discharge status: Home / Self Care | Payer: Medicaid Other | Attending: Family Medicine | Admitting: Family Medicine

## 2018-08-03 ENCOUNTER — Encounter (HOSPITAL_COMMUNITY): Payer: Self-pay | Admitting: Emergency Medicine

## 2018-08-03 DIAGNOSIS — N3001 Acute cystitis with hematuria: Secondary | ICD-10-CM | POA: Diagnosis not present

## 2018-08-03 LAB — POCT URINALYSIS DIP (DEVICE)
BILIRUBIN URINE: NEGATIVE
Glucose, UA: NEGATIVE mg/dL
Ketones, ur: NEGATIVE mg/dL
Nitrite: NEGATIVE
Protein, ur: NEGATIVE mg/dL
Specific Gravity, Urine: 1.025 (ref 1.005–1.030)
Urobilinogen, UA: 0.2 mg/dL (ref 0.0–1.0)
pH: 5.5 (ref 5.0–8.0)

## 2018-08-03 MED ORDER — SULFAMETHOXAZOLE-TRIMETHOPRIM 800-160 MG PO TABS: 1.0000 | ORAL_TABLET | Freq: Two times a day (BID) | ORAL | 0 refills | Status: DC

## 2018-08-03 NOTE — ED Provider Notes (Signed)
MRN: [id number] DOB: 02/19/1961  Subjective:   Wanda Baker is a 58 y.o. female presenting for 2 day history of dysuria and urinary frequency. Has tried draining aggressively with minimal relief.    No current facility-administered medications for this encounter.   Current Outpatient Medications:  .  acetaminophen (TYLENOL 8 HOUR ARTHRITIS PAIN) 650 MG CR tablet, Take 1,300 mg by mouth every 8 (eight) hours as needed for pain., Disp: , Rfl:  .  cetirizine (ZYRTEC) 10 MG tablet, Take 1 tablet (10 mg total) by mouth at bedtime., Disp: 30 tablet, Rfl: 4 .  famotidine (PEPCID) 20 MG tablet, TAKE 1 TABLET(20 MG) BY MOUTH TWICE DAILY (Patient taking differently: Take 20 mg by mouth 2 (two) times daily. ), Disp: 180 tablet, Rfl: 0 .  ferrous sulfate 325 (65 FE) MG tablet, Take 1 tablet (325 mg total) by mouth 2 (two) times daily with a meal., Disp: 180 tablet, Rfl: 0 .  gabapentin (NEURONTIN) 400 MG capsule, Take 1 capsule (400 mg total) by mouth 3 (three) times daily., Disp: 120 capsule, Rfl: 2 .  loperamide (IMODIUM) 2 MG capsule, Take 2 mg by mouth daily as needed for diarrhea or loose stools., Disp: , Rfl:  .  metoprolol tartrate (LOPRESSOR) 50 MG tablet, Take 2 tablets (100 mg total) by mouth daily., Disp: 60 tablet, Rfl: 3 .  Multiple Vitamins-Minerals (ALIVE ONCE DAILY WOMENS 50+ PO), Take 1 tablet by mouth daily. , Disp: , Rfl:  .  psyllium (METAMUCIL) 58.6 % packet, Take 1 packet by mouth daily., Disp: , Rfl:  .  saccharomyces boulardii (FLORASTOR) 250 MG capsule, Take 1 capsule (250 mg total) by mouth 2 (two) times daily., Disp: 60 capsule, Rfl: 0    Allergies  Allergen Reactions  . Hydrocodone Other (See Comments)    Palpitations / dizziness  . Nsaids Other (See Comments)    GERD "tears my stomach up"  . Penicillins Swelling, Rash and Other (See Comments)    Has patient had a PCN reaction causing immediate rash, facial/tongue/throat swelling, SOB or lightheadedness with  hypotension: Yes Has patient had a PCN reaction causing severe rash involving mucus membranes or skin necrosis: No Has patient had a PCN reaction that required hospitalization No Has patient had a PCN reaction occurring within the last 10 years: No If all of the above answers are "NO", then may proceed with Cephalosporin use.   . Prednisone Rash    Past Medical History:  Diagnosis Date  . Adenocarcinoma of colon ([place]) 09/23/2016  . Allergy    uses Nasocort daily and takes Singulair nightly   . Anemia   . Arthritis   . Chronic back pain    buldging disc  . Diabetes mellitus without complication (HCC)    diet controlled  . DJD (degenerative joint disease)    whole spine  . GERD (gastroesophageal reflux disease)    takes Omeprazole daily   . History of bronchitis    many yrs ago  . Hyperlipidemia    takes Atorvastatin daily  . Hypertension   . 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 Weakness    left arm resolved now     Past Surgical History:  Procedure Laterality Date  . ABDOMINAL HYSTERECTOMY  2011   Supracervical Hysterectomy complete  . ANTERIOR CERVICAL DECOMP/DISCECTOMY FUSION N/A 12/02/2013   Procedure: ANTERIOR CERVICAL DECOMPRESSION/DISCECTOMY FUSION 3 LEVELS;  Surgeon: Erline Levine, MD;  Location: [facility];  Service: Neurosurgery;  Laterality:  N/A;  C4-5 C5-6 C6-7 Anterior cervical decompression/diskectomy/fusion  . BOWEL RESECTION N/A 09/18/2016   Procedure: LOW ANTERIOR COLON RESECTION WITH COLOSTOMY;  Surgeon: Fanny Skates, MD;  Location: [place];  Service: General;  Laterality: N/A;  . BOWEL RESECTION  08/08/2017   Procedure: SMALL BOWEL RESECTION x 2;  Surgeon: Ileana Roup, MD;  Location: [facility];  Service: General;;  . CESAREAN SECTION  86/88  . COLONOSCOPY    . COLOSTOMY TAKEDOWN N/A 08/08/2017   Procedure: HARTMANNS TAKEDOWN OF COLOSTOMY WITH COLORECTAL ANASTOMOIS ERAS PATHWAY;  Surgeon: Ileana Roup, MD;  Location: [facility];  Service: General;  Laterality: N/A;  . CYSTOSCOPY W/ URETERAL STENT PLACEMENT N/A 08/08/2017   Procedure: CYSTOSCOPY WITH URETERAL CATHETERS;  Surgeon: Ardis Hughs, MD;  Location: [facility];  Service: Urology;  Laterality: N/A;  . FLEXIBLE SIGMOIDOSCOPY N/A 09/17/2016   Procedure: FLEXIBLE SIGMOIDOSCOPY;  Surgeon: Mauri Pole, MD;  Location: [facility];  Service: Endoscopy;  Laterality: N/A;  . FLEXIBLE SIGMOIDOSCOPY N/A 08/08/2017   Procedure: FLEXIBLE SIGMOIDOSCOPY;  Surgeon: Ileana Roup, MD;  Location: [facility];  Service: General;  Laterality: N/A;  . FLEXIBLE SIGMOIDOSCOPY N/A 11/24/2017   Procedure: FLEXIBLE SIGMOIDOSCOPY;  Surgeon: Ileana Roup, MD;  Location: [facility];  Service: General;  Laterality: N/A;  . ILEOSTOMY N/A 08/08/2017   Procedure: DIVERTIN  ILEOSTOMY;  Surgeon: Ileana Roup, MD;  Location: [facility];  Service: General;  Laterality: N/A;  . LAPAROSCOPY N/A 03/27/2018   Procedure: CLOSURE OF ILEOSTOMY;  Surgeon: Ileana Roup, MD;  Location: [facility];  Service: General;  Laterality: N/A;  . LAPAROTOMY N/A 08/08/2017   Procedure: EXPLORATORY LAPAROTOMY;  Surgeon: Ileana Roup, MD;  Location: [facility];  Service: General;  Laterality: N/A;  . LYSIS OF ADHESION  08/08/2017   Procedure: LYSIS OF ADHESION;  Surgeon: Ileana Roup, MD;  Location: [facility];  Service: General;;  . POLYPECTOMY    . RECTAL EXAM UNDER ANESTHESIA  11/24/2017   Procedure: RECTAL EXAM UNDER ANESTHESIA;  Surgeon: Ileana Roup, MD;  Location: [facility];  Service: General;;  . SIGMOIDOSCOPY    . TUBAL LIGATION      ROS Denies fever, hematuria, urinary urgency, flank pain, abdominal pain, pelvic pain, genital rash and vaginal discharge, nausea and vomiting.   Objective:   Vitals: BP 128/82 (BP Location: Right Arm)   Pulse 94   Temp 98.2 F (36.8 C) (Temporal)   Resp 18   SpO2 100%   Physical Exam Constitutional:       General: She is not in acute distress.    Appearance: Normal appearance. She is well-developed and normal weight. She is not ill-appearing, toxic-appearing or diaphoretic.  HENT:     Head: Normocephalic and atraumatic.     Right Ear: External ear normal.     Left Ear: External ear normal.     Nose: Nose normal.     Mouth/Throat:     Mouth: Mucous membranes are moist.     Pharynx: Oropharynx is clear.  Eyes:     General: No scleral icterus.    Extraocular Movements: Extraocular movements intact.     Pupils: Pupils are equal, round, and reactive to light.  Cardiovascular:     Rate and Rhythm: Normal rate and regular rhythm.     Heart sounds: Normal heart sounds. No murmur. No friction rub. No gallop.   Pulmonary:     Effort: Pulmonary effort is normal. No respiratory distress.  Breath sounds: Normal breath sounds. No stridor. No wheezing, rhonchi or rales.  Abdominal:     General: Bowel sounds are normal. There is no distension.     Palpations: Abdomen is soft. There is no mass.     Tenderness: There is no abdominal tenderness. There is no right CVA tenderness, left CVA tenderness, guarding or rebound.  Skin:    General: Skin is warm and dry.     Coloration: Skin is not pale.     Findings: No rash.  Neurological:     General: No focal deficit present.     Mental Status: She is alert and oriented to person, place, and time.  Psychiatric:        Mood and Affect: Mood normal.        Behavior: Behavior normal.        Thought Content: Thought content normal.        Judgment: Judgment normal.     Results for orders placed or performed during the hospital encounter of 08/03/18 (from the past 24 hour(s))  POCT urinalysis dip (device)     Status: Abnormal   Collection Time: 08/03/18 11:32 AM  Result Value Ref Range   Glucose, UA NEGATIVE NEGATIVE mg/dL   Bilirubin Urine NEGATIVE NEGATIVE   Ketones, ur NEGATIVE NEGATIVE mg/dL   Specific Gravity, Urine 1.025 1.005 - 1.030    Hgb urine dipstick MODERATE (A) NEGATIVE   pH 5.5 5.0 - 8.0   Protein, ur NEGATIVE NEGATIVE mg/dL   Urobilinogen, UA 0.2 0.0 - 1.0 mg/dL   Nitrite NEGATIVE NEGATIVE   Leukocytes,Ua SMALL (A) NEGATIVE    Assessment and Plan :   Acute cystitis with hematuria  We will manage for an UTI with Bactrim, aggressive hydration and supportive care.  Urine culture is pending. Counseled patient on potential for adverse effects with medications prescribed today, patient verbalized understanding. ER and return-to-clinic precautions discussed, patient verbalized understanding.    Jaynee Eagles, PA-C 08/03/18 1148

## 2018-08-03 NOTE — ED Triage Notes (Signed)
Pt here for UTI sx x 3 days.

## 2018-08-04 ENCOUNTER — Other Ambulatory Visit: Payer: Self-pay | Admitting: Family Medicine

## 2018-08-04 DIAGNOSIS — I10 Essential (primary) hypertension: Secondary | ICD-10-CM

## 2018-08-05 LAB — URINE CULTURE: Culture: >=100000 — AB

## 2018-08-06 ENCOUNTER — Telehealth (HOSPITAL_COMMUNITY): Payer: Self-pay | Admitting: Emergency Medicine

## 2018-08-06 NOTE — Telephone Encounter (Signed)
Urine culture was positive for Klebsiella pneumoniae and was given bactrim at urgent care visit. Pt contacted and made aware, educated on completing antibiotic and to follow up if symptoms are persistent. Verbalized understanding.

## 2018-08-12 ENCOUNTER — Other Ambulatory Visit: Payer: Self-pay | Admitting: Family Medicine

## 2018-11-02 ENCOUNTER — Telehealth: Payer: Self-pay | Admitting: Oncology

## 2018-11-02 NOTE — Telephone Encounter (Signed)
Per BS r/s list converted 5/26 f/u to telephone visit - patient will keep lab. Confirmed with patient.

## 2018-11-10 ENCOUNTER — Inpatient Hospital Stay: Payer: Medicaid Other | Attending: Oncology

## 2018-11-10 ENCOUNTER — Other Ambulatory Visit: Payer: Self-pay

## 2018-11-10 ENCOUNTER — Inpatient Hospital Stay (HOSPITAL_BASED_OUTPATIENT_CLINIC_OR_DEPARTMENT_OTHER): Payer: Medicaid Other | Admitting: Oncology

## 2018-11-10 DIAGNOSIS — D509 Iron deficiency anemia, unspecified: Secondary | ICD-10-CM | POA: Insufficient documentation

## 2018-11-10 DIAGNOSIS — Z79899 Other long term (current) drug therapy: Secondary | ICD-10-CM | POA: Insufficient documentation

## 2018-11-10 DIAGNOSIS — Z933 Colostomy status: Secondary | ICD-10-CM | POA: Diagnosis not present

## 2018-11-10 DIAGNOSIS — C20 Malignant neoplasm of rectum: Secondary | ICD-10-CM | POA: Insufficient documentation

## 2018-11-10 LAB — CEA (IN HOUSE-CHCC): CEA (CHCC-In House): 1.21 ng/mL (ref 0.00–5.00)

## 2018-11-10 NOTE — Progress Notes (Signed)
Error patient not seen

## 2018-11-11 ENCOUNTER — Telehealth: Payer: Self-pay | Admitting: Oncology

## 2018-11-11 ENCOUNTER — Telehealth: Payer: Self-pay | Admitting: *Deleted

## 2018-11-11 NOTE — Telephone Encounter (Signed)
Called for lab results: provided her CEA results. She says she is doing fine with no complaints. Last colonoscopy was 04/2018 with no polyps seen. She is glad to delay f/u with Dr. Benay Spice to later in the year.

## 2018-11-11 NOTE — Telephone Encounter (Signed)
No los per 5/26.

## 2018-11-13 ENCOUNTER — Inpatient Hospital Stay (HOSPITAL_BASED_OUTPATIENT_CLINIC_OR_DEPARTMENT_OTHER): Payer: Medicaid Other | Admitting: Oncology

## 2018-11-13 DIAGNOSIS — C20 Malignant neoplasm of rectum: Secondary | ICD-10-CM | POA: Diagnosis not present

## 2018-11-13 DIAGNOSIS — D508 Other iron deficiency anemias: Secondary | ICD-10-CM

## 2018-11-13 NOTE — Progress Notes (Addendum)
  [facility] OFFICE VISIT PROGRESS NOTE  I connected with Wanda Baker on 11/13/18 at 10:45 AM EDT by telephone and verified that I am speaking with the correct person using two identifiers.    Patient's location: Home Provider's location: Office   Diagnosis: Rectal cancer  INTERVAL HISTORY:   Wanda Baker is seen today for a telephone visit.  She consented to a telephone visit in lieu of an in person visit.  This is due to the [place] pandemic.  Wanda Baker reports feeling well.  No difficulty with bowel function.  No complaint. She has not undergone a surveillance colonoscopy since the low anterior resection.    Lab Results:  Lab Results  Component Value Date   WBC 9.4 03/29/2018   HGB 12.5 03/29/2018   HCT 38.3 03/29/2018   MCV 88.9 03/29/2018   PLT 221 03/29/2018   NEUTROABS 6.8 03/29/2018   CEA on 11/10/2018: 1.21  Medications: I have reviewed the patient's current medications.  Assessment/Plan: 1. 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 Tumor noted at 15 cm on sigmoidoscopy 09/17/2016 ? CT abdomen/pelvis 09/16/2016-colon obstruction, mass at the "distal sigmoid colon " ? Elevated preoperative CEA ? Low anterior resection 09/18/2016, pT3,pN0, 0/20 lymph nodes positive, extracellular mucin without definitive tumor on the inked serosal surface ? MSI-stable, no loss of mismatch repair protein expression ? Initiation of Xeloda/radiation 11/21/2016; completion of Xeloda/radiation 12/31/2016 ? Cycle 1 adjuvant Xeloda 01/27/2017 ? Cycle 2 adjuvant Xeloda 02/17/2017 ? Cycle 3 adjuvant Xeloda 03/10/2017 ? Cycle 4 adjuvant Xeloda 03/31/2017 (Xeloda dose escalated by one tablet daily) ? Cycle 5 adjuvant Xeloda 04/21/2017 ? Colonoscopy 04/18/2017-tubular adenoma removed from the ascending colon ? Colostomy  takedown and creation of an ileostomy 08/08/2017 ? Takedown of diverting ileostomy 03/27/2018 ? Negative colonoscopy 06/04/2018  2. History of a colonic obstruction secondary to #1  3. Iron deficiency anemia secondary to #1. Resolved    Disposition: Wanda Baker is in clinical remission from rectal cancer.  She will return for an office visit and CEA in 6 months.  I will contact Dr. Silverio Decamp to discuss the timing of a surveillance colonoscopy.  Wanda Baker will return for an office visit and CEA in 6 months.   I discussed the assessment and treatment plan with the patient. The patient was provided an opportunity to ask questions and all were answered. The patient agreed with the plan and demonstrated an understanding of the instructions.   The patient was advised to call back or seek an in-person evaluation if the symptoms worsen or if the condition fails to improve as anticipated.  I provided 15 minutes of telephone, chart review, and documentation time during this encounter, and > 50% was spent counseling as documented under my assessment & plan.  [name] ANP/GNP-BC   11/13/2018 10:55 AM

## 2018-11-16 ENCOUNTER — Telehealth: Payer: Self-pay | Admitting: Oncology

## 2018-11-16 NOTE — Telephone Encounter (Signed)
Scheduled appt per 5/29 los. A calendar will be mailed out.

## 2018-12-01 ENCOUNTER — Other Ambulatory Visit: Payer: Self-pay

## 2018-12-01 ENCOUNTER — Encounter: Payer: Self-pay | Admitting: Family Medicine

## 2018-12-01 ENCOUNTER — Ambulatory Visit: Payer: Medicaid Other | Admitting: Family Medicine

## 2018-12-01 VITALS — BP 120/80 | HR 86 | Wt 162.6 lb

## 2018-12-01 DIAGNOSIS — Z9889 Other specified postprocedural states: Secondary | ICD-10-CM

## 2018-12-01 DIAGNOSIS — I10 Essential (primary) hypertension: Secondary | ICD-10-CM

## 2018-12-01 DIAGNOSIS — E119 Type 2 diabetes mellitus without complications: Secondary | ICD-10-CM

## 2018-12-01 DIAGNOSIS — Z0000 Encounter for general adult medical examination without abnormal findings: Secondary | ICD-10-CM | POA: Diagnosis not present

## 2018-12-01 LAB — POCT GLYCOSYLATED HEMOGLOBIN (HGB A1C): HbA1c, POC (controlled diabetic range): 6.4 % (ref 0.0–7.0)

## 2018-12-01 MED ORDER — METOPROLOL TARTRATE 50 MG PO TABS: ORAL_TABLET | ORAL | 0 refills | Status: DC

## 2018-12-01 NOTE — Assessment & Plan Note (Signed)
A1c increased from 5.5-6.4.  Likely secondary to decreased exercise and perhaps worsening diet secondary to coronavirus pandemic.  Usually came incision with patient to try diet and exercise for the next 3 months.  Opted against starting metformin.  Explained patient that if her A1c is still this elevated at next appointment we would likely start metformin at that time.

## 2018-12-01 NOTE — Assessment & Plan Note (Signed)
Healing well.  Doing well from rectal cancer standpoint.

## 2018-12-01 NOTE — Assessment & Plan Note (Signed)
BP 120/80.  Continue metoprolol 100 mg daily.

## 2018-12-01 NOTE — Progress Notes (Signed)
   HPI 58 year old female presents for diabetes checkup.  Patient states otherwise her things been going well for her.  She is currently in remission from rectal cancer and everything is looking good per her oncologist.  A1c increased from 5.5-6.4 today.  Patient feels that it is due to getting mildly exercise and usual secondary to the COVID-19 pandemic.  Foot exam performed.  Patient overdue for optometry check.  CC: Diabetes follow-up   ROS:   Review of Systems See HPI for ROS.   CC, SH/smoking status, and VS noted  Objective: BP 120/80 (BP Location: Left Arm, Patient Position: Sitting, Cuff Size: Normal)   Pulse 86   Wt 162 lb 9.6 oz (73.8 kg)   SpO2 96%   BMI 25.47 kg/m  Gen: 58 year old Caucasian female, no acute distress, seen comfortably CV: RRR, no murmur Resp: CTAB, no wheezes, non-labored Abd: SNTND, BS present, no guarding or organomegaly.  Status post ileostomy reversal. Neuro: Alert and oriented, Speech clear, No gross deficits   Assessment and plan:  HYPERTENSION, BENIGN ESSENTIAL BP 120/80.  Continue metoprolol 100 mg daily.  Diabetes type 2, controlled ([place]) A1c increased from 5.5-6.4.  Likely secondary to decreased exercise and perhaps worsening diet secondary to coronavirus pandemic.  Usually came incision with patient to try diet and exercise for the next 3 months.  Opted against starting metformin.  Explained patient that if her A1c is still this elevated at next appointment we would likely start metformin at that time.  S/p colostomy takedown with protective loop ileostomy 08/08/2017 Healing well.  Doing well from rectal cancer standpoint.  Healthcare maintenance Perform diabetic foot exam today.  Explained to patient that she needs establish care with optometry for vision check.  Needs Pap smear next appointment.   Orders Placed This Encounter  Procedures  . HgB A1c    Meds ordered this encounter  Medications  . metoprolol tartrate (LOPRESSOR)  50 MG tablet    Sig: TAKE 2 TABLETS(100 MG) BY MOUTH DAILY    Dispense:  180 tablet    Refill:  0     [name] MD PGY-2 Family Medicine Resident  12/01/2018 11:16 AM

## 2018-12-01 NOTE — Assessment & Plan Note (Signed)
Perform diabetic foot exam today.  Explained to patient that she needs establish care with optometry for vision check.  Needs Pap smear next appointment.

## 2018-12-01 NOTE — Patient Instructions (Signed)
It was great seeing you again today!  I am whether things been going well.  Fortunately her A1c increased a little bit today from 5.5-6.4.  Can initial decision to let you try diet exercise for the next 3 months for recheck.  I refilled your blood pressure medication.  You are overdue for a Pap smear, we will do that in 3 months when you follow-up.

## 2018-12-02 DIAGNOSIS — C20 Malignant neoplasm of rectum: Secondary | ICD-10-CM | POA: Diagnosis not present

## 2018-12-08 ENCOUNTER — Other Ambulatory Visit: Payer: Self-pay | Admitting: Surgery

## 2018-12-08 DIAGNOSIS — C20 Malignant neoplasm of rectum: Secondary | ICD-10-CM

## 2018-12-15 DIAGNOSIS — C20 Malignant neoplasm of rectum: Secondary | ICD-10-CM | POA: Diagnosis not present

## 2018-12-28 ENCOUNTER — Ambulatory Visit
Admission: RE | Admit: 2018-12-28 | Discharge: 2018-12-28 | Disposition: A | Discharge status: Home / Self Care | Payer: Medicaid Other | Source: Ambulatory Visit | Attending: Surgery | Admitting: Surgery

## 2018-12-28 DIAGNOSIS — C20 Malignant neoplasm of rectum: Secondary | ICD-10-CM

## 2018-12-28 DIAGNOSIS — I2510 Atherosclerotic heart disease of native coronary artery without angina pectoris: Secondary | ICD-10-CM | POA: Diagnosis not present

## 2018-12-28 DIAGNOSIS — K4090 Unilateral inguinal hernia, without obstruction or gangrene, not specified as recurrent: Secondary | ICD-10-CM | POA: Diagnosis not present

## 2018-12-28 DIAGNOSIS — K439 Ventral hernia without obstruction or gangrene: Secondary | ICD-10-CM | POA: Diagnosis not present

## 2018-12-28 DIAGNOSIS — Z85038 Personal history of other malignant neoplasm of large intestine: Secondary | ICD-10-CM | POA: Diagnosis not present

## 2018-12-28 DIAGNOSIS — K760 Fatty (change of) liver, not elsewhere classified: Secondary | ICD-10-CM | POA: Diagnosis not present

## 2018-12-28 MED ORDER — IOPAMIDOL (ISOVUE-300) INJECTION 61%
100.0000 mL | Freq: Once | INTRAVENOUS | Status: AC | PRN
  Administered 2018-12-28: 100 mL via INTRAVENOUS

## 2019-02-08 ENCOUNTER — Other Ambulatory Visit: Payer: Self-pay | Admitting: Family Medicine

## 2019-02-08 DIAGNOSIS — K219 Gastro-esophageal reflux disease without esophagitis: Secondary | ICD-10-CM

## 2019-02-17 ENCOUNTER — Ambulatory Visit: Payer: Medicaid Other | Admitting: Family Medicine

## 2019-02-17 ENCOUNTER — Other Ambulatory Visit: Payer: Self-pay

## 2019-02-17 ENCOUNTER — Encounter: Payer: Self-pay | Admitting: Family Medicine

## 2019-02-17 VITALS — BP 136/72 | HR 84

## 2019-02-17 DIAGNOSIS — E119 Type 2 diabetes mellitus without complications: Secondary | ICD-10-CM

## 2019-02-17 DIAGNOSIS — I4891 Unspecified atrial fibrillation: Secondary | ICD-10-CM

## 2019-02-17 DIAGNOSIS — Z23 Encounter for immunization: Secondary | ICD-10-CM

## 2019-02-17 DIAGNOSIS — E785 Hyperlipidemia, unspecified: Secondary | ICD-10-CM | POA: Diagnosis not present

## 2019-02-17 DIAGNOSIS — I10 Essential (primary) hypertension: Secondary | ICD-10-CM

## 2019-02-17 DIAGNOSIS — C20 Malignant neoplasm of rectum: Secondary | ICD-10-CM | POA: Diagnosis not present

## 2019-02-17 LAB — POCT GLYCOSYLATED HEMOGLOBIN (HGB A1C): HbA1c, POC (controlled diabetic range): 6.5 % (ref 0.0–7.0)

## 2019-02-17 MED ORDER — GABAPENTIN 400 MG PO CAPS: ORAL_CAPSULE | ORAL | 1 refills | Status: DC

## 2019-02-17 NOTE — Progress Notes (Signed)
   HPI 58 year old female who presents for diabetes management.  She states everything is been going well.  She has been eating a little better and getting a good amount of daily exercise via walks with her dog.  A1c today is 6.5 from 6.4.  Patient is interested in getting a flu vaccine today.  Discussed getting Pap smear with patient for which she has had received but we opted to do that at next visit.  She is also overdue for ophthalmology exam.  Patient states that she will schedule to get the records.  In regards to rectal cancer everything appears to be going well and is in remission.  She has a follow-up appointment with oncology in November for likely a surveillance CEA.  She had CT chest abdomen pelvis back in July which was negative for malignancy.  CC: Diabetes management   ROS:   Review of Systems See HPI for ROS.   CC, SH/smoking status, and VS noted  Objective: BP 136/72   Pulse 84   SpO2 43%  Gen: 58 year old Caucasian female, no acute distress, resting comfortably.  Very pleasant HEENT: Moist because membranes, tongue midline, symmetric palate elevation.  EOMI, PERRLA. CV: Regular rate rhythm, no M/R/G.  Skin warm and dry. Resp: Lungs clear to auscultation bilaterally, no accessory muscle use, no respiratory distress Neuro: Alert and oriented, Speech clear, No gross deficits   Assessment and plan:  HYPERTENSION, BENIGN ESSENTIAL BP 136/72.  Patient currently taking metoprolol 100 mg daily.  This is likely for atrial fibrillation.  Heart rate 84 in office.  Could consider starting lisinopril for renal protection secondary to diabetes and future appointments.  Atrial fibrillation (HCC) Heart rate 84 in office.  Takes metoprolol 100 mg daily.  Could consider decreasing dose starting lisinopril for renal protection for diabetes at future appointments.  She was evaluated by cardiology and diagnosed with sinus tachycardia back in February 2020.  Rectal cancer ([place]) In  remission.  CT scan back in July of chest abdomen pelvis did not show any concerning findings for malignancy.  Has follow-up with oncology in November.  Hyperlipidemia Has not had lipid panel checked in 3 years.  Will get lipid panel.  Discussed starting either Lipitor or Crestor with patient pending those results.  Would likely be beneficial even without hypercholesterolemia secondary to diabetes.  Diabetes type 2, controlled (HCC) A1c 6.5 from 6.4.  Will control with diet exercise this point.  Patient to follow-up in 3 months.  Patient has been doing well with her diet and exercise, gave brief counseling.   Orders Placed This Encounter  Procedures  . Lipid panel    Order Specific Question:   Has the patient fasted?    Answer:   No  . HgB A1c    Meds ordered this encounter  Medications  . gabapentin (NEURONTIN) 400 MG capsule    Sig: TAKE 1 CAPSULE(400 MG) BY MOUTH THREE TIMES DAILY    Dispense:  180 capsule    Refill:  1     [name] MD PGY-3 Family Medicine Resident  02/17/2019 10:05 AM

## 2019-02-17 NOTE — Patient Instructions (Signed)
It was great seeing you today!  Your hemoglobin A1c is stable at 6.5 from 6.4.  This is well within the margin of error for that test so it is essentially the same.  Since it is the same we will continue to manage it with diet and exercise.  Make sure you make good healthy eating choices and continue walking the dog.  He got a flu vaccine today.  You are overdue for a Pap smear but we decided we will do that next time.  At your earliest convenience please call and schedule an optometry appointment for an eye exam.  We will need to get your eye exam please have them send [place] the records so I can update your chart.  It is been about 3 years since she got a cholesterol panel.  We drew that today.  With your diabetes it is likely a good idea to start you on a medication called Lipitor or Crestor.  We wait for the diabetes panel to come back and discuss results.  I am glad that everything is looking good from a rectal cancer perspective.  Please make sure you keep your next follow-up appointment, which appears to be sometime in November.

## 2019-02-17 NOTE — Assessment & Plan Note (Signed)
Has not had lipid panel checked in 3 years.  Will get lipid panel.  Discussed starting either Lipitor or Crestor with patient pending those results.  Would likely be beneficial even without hypercholesterolemia secondary to diabetes.

## 2019-02-17 NOTE — Assessment & Plan Note (Addendum)
BP 136/72.  Patient currently taking metoprolol 100 mg daily.  This is likely for atrial fibrillation.  Heart rate 84 in office.  Could consider starting lisinopril for renal protection secondary to diabetes and future appointments.

## 2019-02-17 NOTE — Assessment & Plan Note (Addendum)
Heart rate 84 in office.  Takes metoprolol 100 mg daily.  Could consider decreasing dose starting lisinopril for renal protection for diabetes at future appointments.  She was evaluated by cardiology and diagnosed with sinus tachycardia back in February 2020.

## 2019-02-17 NOTE — Assessment & Plan Note (Signed)
A1c 6.5 from 6.4.  Will control with diet exercise this point.  Patient to follow-up in 3 months.  Patient has been doing well with her diet and exercise, gave brief counseling.

## 2019-02-17 NOTE — Assessment & Plan Note (Signed)
In remission.  CT scan back in July of chest abdomen pelvis did not show any concerning findings for malignancy.  Has follow-up with oncology in November.

## 2019-02-18 LAB — LIPID PANEL
Chol/HDL Ratio: 4.5 ratio — ABNORMAL HIGH (ref 0.0–4.4)
Cholesterol, Total: 171 mg/dL (ref 100–199)
HDL: 38 mg/dL — ABNORMAL LOW (ref >39)
LDL Chol Calc (NIH): 83 mg/dL (ref 0–99)
Triglycerides: 308 mg/dL — ABNORMAL HIGH (ref 0–149)
VLDL Cholesterol Cal: 50 mg/dL — ABNORMAL HIGH (ref 5–40)

## 2019-03-01 ENCOUNTER — Other Ambulatory Visit: Payer: Self-pay | Admitting: Family Medicine

## 2019-03-01 DIAGNOSIS — I10 Essential (primary) hypertension: Secondary | ICD-10-CM

## 2019-03-30 ENCOUNTER — Other Ambulatory Visit: Payer: Self-pay | Admitting: Family Medicine

## 2019-03-30 DIAGNOSIS — Z1231 Encounter for screening mammogram for malignant neoplasm of breast: Secondary | ICD-10-CM

## 2019-05-10 ENCOUNTER — Other Ambulatory Visit: Payer: Self-pay | Admitting: Family Medicine

## 2019-05-10 DIAGNOSIS — K219 Gastro-esophageal reflux disease without esophagitis: Secondary | ICD-10-CM

## 2019-05-11 ENCOUNTER — Telehealth: Payer: Self-pay | Admitting: Oncology

## 2019-05-11 NOTE — Telephone Encounter (Signed)
Called patient per providers request, patient agreed to do phone visit on 11/30.

## 2019-05-17 ENCOUNTER — Inpatient Hospital Stay: Payer: Medicaid Other | Attending: Oncology

## 2019-05-17 ENCOUNTER — Other Ambulatory Visit: Payer: Self-pay

## 2019-05-17 ENCOUNTER — Inpatient Hospital Stay (HOSPITAL_BASED_OUTPATIENT_CLINIC_OR_DEPARTMENT_OTHER): Payer: Medicaid Other | Admitting: Oncology

## 2019-05-17 DIAGNOSIS — Z923 Personal history of irradiation: Secondary | ICD-10-CM | POA: Insufficient documentation

## 2019-05-17 DIAGNOSIS — D509 Iron deficiency anemia, unspecified: Secondary | ICD-10-CM | POA: Diagnosis not present

## 2019-05-17 DIAGNOSIS — Z933 Colostomy status: Secondary | ICD-10-CM | POA: Diagnosis not present

## 2019-05-17 DIAGNOSIS — C20 Malignant neoplasm of rectum: Secondary | ICD-10-CM | POA: Diagnosis not present

## 2019-05-17 DIAGNOSIS — Z9221 Personal history of antineoplastic chemotherapy: Secondary | ICD-10-CM | POA: Diagnosis not present

## 2019-05-17 LAB — CEA (IN HOUSE-CHCC): CEA (CHCC-In House): 1.11 ng/mL (ref 0.00–5.00)

## 2019-05-17 NOTE — Progress Notes (Signed)
  Will OFFICE VISIT PROGRESS NOTE  I connected with Wanda Baker  on 05/17/19 at  3:30 PM EST by telephone and verified that I am speaking with the correct person using two identifiers.   I discussed the limitations, risks, security and privacy concerns of performing an evaluation and management service by telemedicine and the availability of in-person appointments. I also discussed with the patient that there may be a patient responsible charge related to this service. The patient expressed understanding and agreed to proceed.    Patient's location: Home Provider's location: Office   Diagnosis: Rectal cancer  INTERVAL HISTORY:   Wanda Baker is seen today for a telehealth visit.  Telehealth visit is secondary to distancing with the Covid pandemic.  She reports feeling well.  Good appetite and energy level.  She is walking for exercise.  No complaint.    Lab Results:  Lab Results  Component Value Date   WBC 9.4 03/29/2018   HGB 12.5 03/29/2018   HCT 38.3 03/29/2018   MCV 88.9 03/29/2018   PLT 221 03/29/2018   NEUTROABS 6.8 03/29/2018    Medications: I have reviewed the patient's current medications.  Assessment/Plan: 1. 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 Tumor noted at 15 cm on sigmoidoscopy 09/17/2016 ? CT abdomen/pelvis 09/16/2016-colon obstruction, mass at the "distal sigmoid colon " ? Elevated preoperative CEA ? Low anterior resection 09/18/2016, pT3,pN0, 0/20 lymph nodes positive, extracellular mucin without definitive tumor on the inked serosal surface ? MSI-stable, no loss of mismatch repair protein expression ? Initiation of Xeloda/radiation 11/21/2016; completion of Xeloda/radiation 12/31/2016 ? Cycle 1 adjuvant Xeloda 01/27/2017 ? Cycle 2 adjuvant Xeloda 02/17/2017 ? Cycle 3  adjuvant Xeloda 03/10/2017 ? Cycle 4 adjuvant Xeloda 03/31/2017 (Xeloda dose escalated by one tablet daily) ? Cycle 5 adjuvant Xeloda 04/21/2017 ? Colonoscopy 04/18/2017-tubular adenoma removed from the ascending colon ? Colostomy takedown and creation of an ileostomy 08/08/2017 ? Takedown of diverting ileostomy 03/27/2018 ? Negative colonoscopy 06/04/2018  2. History of a colonic obstruction secondary to #1  3. Iron deficiency anemia secondary to #1. Resolved    Disposition: Wanda Baker is in clinical remission from rectal cancer.  We will follow-up on the CEA from today.  She will return for an office visit and CEA in 6 months.  She will continue colonoscopy surveillance with Dr. Silverio Decamp.   I discussed the assessment and treatment plan with the patient. The patient was provided an opportunity to ask questions and all were answered. The patient agreed with the plan and demonstrated an understanding of the instructions.   The patient was advised to call back or seek an in-person evaluation if the symptoms worsen or if the condition fails to improve as anticipated.  I provided 15 minutes of telephone, chart review, and documentation time during this encounter, and > 50% was spent counseling as documented under my assessment & plan.  [name] ANP/GNP-BC   05/17/2019 3:36 PM

## 2019-05-18 ENCOUNTER — Telehealth: Payer: Self-pay | Admitting: Oncology

## 2019-05-18 NOTE — Telephone Encounter (Signed)
Scheduled per los. Called and left msg. mailed printout  

## 2019-05-19 ENCOUNTER — Other Ambulatory Visit: Payer: Self-pay

## 2019-05-19 ENCOUNTER — Ambulatory Visit
Admission: RE | Admit: 2019-05-19 | Discharge: 2019-05-19 | Disposition: A | Discharge status: Home / Self Care | Payer: Medicaid Other | Source: Ambulatory Visit | Attending: *Deleted | Admitting: *Deleted

## 2019-05-19 DIAGNOSIS — Z1231 Encounter for screening mammogram for malignant neoplasm of breast: Secondary | ICD-10-CM | POA: Diagnosis not present

## 2019-05-20 ENCOUNTER — Telehealth: Payer: Self-pay | Admitting: *Deleted

## 2019-05-20 NOTE — Telephone Encounter (Signed)
Called for CEA results. This was provided. She will f/u in 6 months.

## 2019-05-26 ENCOUNTER — Other Ambulatory Visit: Payer: Self-pay | Admitting: Family Medicine

## 2019-05-26 DIAGNOSIS — I10 Essential (primary) hypertension: Secondary | ICD-10-CM

## 2019-06-08 ENCOUNTER — Ambulatory Visit: Payer: Medicaid Other | Admitting: Family Medicine

## 2019-06-21 ENCOUNTER — Ambulatory Visit (INDEPENDENT_AMBULATORY_CARE_PROVIDER_SITE_OTHER): Payer: Medicaid Other | Admitting: Family Medicine

## 2019-06-21 ENCOUNTER — Other Ambulatory Visit: Payer: Self-pay

## 2019-06-21 VITALS — BP 140/75 | HR 84 | Wt 161.8 lb

## 2019-06-21 DIAGNOSIS — R3915 Urgency of urination: Secondary | ICD-10-CM | POA: Diagnosis not present

## 2019-06-21 DIAGNOSIS — N390 Urinary tract infection, site not specified: Secondary | ICD-10-CM | POA: Diagnosis not present

## 2019-06-21 DIAGNOSIS — E785 Hyperlipidemia, unspecified: Secondary | ICD-10-CM | POA: Diagnosis not present

## 2019-06-21 DIAGNOSIS — C20 Malignant neoplasm of rectum: Secondary | ICD-10-CM

## 2019-06-21 DIAGNOSIS — E119 Type 2 diabetes mellitus without complications: Secondary | ICD-10-CM

## 2019-06-21 LAB — POCT UA - MICROSCOPIC ONLY

## 2019-06-21 LAB — POCT URINALYSIS DIP (MANUAL ENTRY)
Bilirubin, UA: NEGATIVE
Glucose, UA: NEGATIVE mg/dL
Ketones, POC UA: NEGATIVE mg/dL
Nitrite, UA: NEGATIVE
Protein Ur, POC: NEGATIVE mg/dL
Spec Grav, UA: <=1.005 — AB (ref 1.010–1.025)
Urobilinogen, UA: 0.2 E.U./dL
pH, UA: 5.5 (ref 5.0–8.0)

## 2019-06-21 LAB — POCT GLYCOSYLATED HEMOGLOBIN (HGB A1C): HbA1c, POC (controlled diabetic range): 6.4 % (ref 0.0–7.0)

## 2019-06-21 MED ORDER — ATORVASTATIN CALCIUM 20 MG PO TABS: 20.0000 mg | ORAL_TABLET | Freq: Every day | ORAL | 2 refills | Status: DC

## 2019-06-21 MED ORDER — SULFAMETHOXAZOLE-TRIMETHOPRIM 800-160 MG PO TABS: 1.0000 | ORAL_TABLET | Freq: Two times a day (BID) | ORAL | 0 refills | Status: DC

## 2019-06-21 NOTE — Patient Instructions (Signed)
Great seeing you again today!  I think that your urinalysis is consistent with a UTI.  We will treat you with Bactrim, 2 times a day for 5 days.  Your A1c is about the same as 6.4 from 6.5.  Congratulations on all the good work with this.  I apologize as I did not let you know that cholesterol is little high.  I think due to your diabetes starting atorvastatin is a great idea if this has been shown to have a lot of benefit in regards to health problems and mortality in your specific population.  Glad you got good news regarding your rectal cancer and that this is doing much better.  Magda Paganini make sure to try get a eye appointment here in the near future.  Otherwise have a happy new year and let me know if you need something.

## 2019-06-22 NOTE — Progress Notes (Signed)
   HPI 59 year old female who presents for hemoglobin A1c check as well as UTI evaluation.  Patient has had frequent urination and burning of urination for the last 3 or 4 days.  No fevers as far she knows.  Patient feels that her symptoms are just like other UTIs she has had.  Hemoglobin A1c stable at 6.4 from 6.5.  Congratulated patient on keeping her A1c stable during the covid-19 pandemic.  Lipid panel drawn back in September showing elevated triglycerides, VLDL, low HDL.  Patient seen by her oncologist, Dr. Learta Codding, back in late November.  Everything looks good from a rectal cancer standpoint, she is to follow-up with him in 6 months.  CC: a1c check   ROS:   Review of Systems See HPI for ROS.   CC, SH/smoking status, and VS noted  Objective: BP 140/75   Pulse 84   Wt 161 lb 12.8 oz (73.4 kg)   SpO2 99%   BMI 25.34 kg/m  Gen: Very pleasant 59 year old Caucasian female, no acute distress CV: Regular rate rhythm, no M/R/G, skin warm and dry Resp: Lungs clear to auscultation bilaterally, no accessory muscle use Neuro: Alert and oriented, Speech clear, No gross deficits   Assessment and plan:  Hyperlipidemia High triglyceride, low HDL, high VLDL.  Given these findings and her type 2 diabetes we will start back on atorvastatin 20 mg.  Has been on in the past and tolerated well.  Diabetes type 2, controlled ([place]) Well-controlled with hemoglobin A1c of 6.4 from 6.5.  Currently diet controlled.  Given cholesterol panel will start on atorvastatin 20 mg.  Urinary tract infection 3 to 4-day history of frequent urination, mild burning, incomplete bladder emptying.  UA showing leukocytes and blood.  Will treat as UTI.  Has a history of swelling and rash with penicillins.  Will not do Keflex due to theoretical cross reaction.  The Bactrim DS twice daily for 5 days.  Rectal cancer [facility]) Doing really well from the standpoint.  Recently saw oncologist back in late November and  everything is looking good.  Will follow-up with them in 6 months.   Orders Placed This Encounter  Procedures  . HgB A1c  . POCT urinalysis dipstick  . POCT UA - Microscopic Only    Meds ordered this encounter  Medications  . sulfamethoxazole-trimethoprim (BACTRIM DS) 800-160 MG tablet    Sig: Take 1 tablet by mouth 2 (two) times daily.    Dispense:  10 tablet    Refill:  0  . atorvastatin (LIPITOR) 20 MG tablet    Sig: Take 1 tablet (20 mg total) by mouth daily.    Dispense:  90 tablet    Refill:  2   [name] MD PGY-3 Family Medicine Resident  06/25/2019 2:59 PM

## 2019-06-25 ENCOUNTER — Encounter: Payer: Self-pay | Admitting: Family Medicine

## 2019-06-25 DIAGNOSIS — N390 Urinary tract infection, site not specified: Secondary | ICD-10-CM | POA: Insufficient documentation

## 2019-06-25 NOTE — Assessment & Plan Note (Signed)
3 to 4-day history of frequent urination, mild burning, incomplete bladder emptying.  UA showing leukocytes and blood.  Will treat as UTI.  Has a history of swelling and rash with penicillins.  Will not do Keflex due to theoretical cross reaction.  The Bactrim DS twice daily for 5 days.

## 2019-06-25 NOTE — Assessment & Plan Note (Signed)
High triglyceride, low HDL, high VLDL.  Given these findings and her type 2 diabetes we will start back on atorvastatin 20 mg.  Has been on in the past and tolerated well.

## 2019-06-25 NOTE — Assessment & Plan Note (Signed)
Well-controlled with hemoglobin A1c of 6.4 from 6.5.  Currently diet controlled.  Given cholesterol panel will start on atorvastatin 20 mg.

## 2019-06-25 NOTE — Assessment & Plan Note (Signed)
Doing really well from the standpoint.  Recently saw oncologist back in late November and everything is looking good.  Will follow-up with them in 6 months.

## 2019-08-02 ENCOUNTER — Other Ambulatory Visit: Payer: Self-pay | Admitting: *Deleted

## 2019-08-02 MED ORDER — GABAPENTIN 400 MG PO CAPS: ORAL_CAPSULE | ORAL | 1 refills | Status: DC

## 2019-08-10 ENCOUNTER — Other Ambulatory Visit: Payer: Self-pay | Admitting: *Deleted

## 2019-08-10 DIAGNOSIS — K219 Gastro-esophageal reflux disease without esophagitis: Secondary | ICD-10-CM

## 2019-08-10 MED ORDER — FAMOTIDINE 20 MG PO TABS: 20.0000 mg | ORAL_TABLET | Freq: Two times a day (BID) | ORAL | 0 refills | Status: DC

## 2019-08-30 ENCOUNTER — Other Ambulatory Visit: Payer: Self-pay | Admitting: Family Medicine

## 2019-08-30 DIAGNOSIS — I10 Essential (primary) hypertension: Secondary | ICD-10-CM

## 2019-08-30 MED ORDER — METOPROLOL TARTRATE 50 MG PO TABS: ORAL_TABLET | ORAL | 0 refills | Status: DC

## 2019-08-30 NOTE — Telephone Encounter (Signed)
Pt is calling and would like to have her metoprolol refilled. She said that the pharmacy has faxed [place] a request twice but it does not look like we have received it.   She took her last two pills this morning and asks to have this refilled asap.

## 2019-09-03 ENCOUNTER — Ambulatory Visit: Payer: Medicaid Other | Admitting: Family Medicine

## 2019-10-01 ENCOUNTER — Ambulatory Visit (INDEPENDENT_AMBULATORY_CARE_PROVIDER_SITE_OTHER): Payer: Medicaid Other | Admitting: Family Medicine

## 2019-10-01 ENCOUNTER — Encounter: Payer: Self-pay | Admitting: Family Medicine

## 2019-10-01 ENCOUNTER — Other Ambulatory Visit: Payer: Self-pay

## 2019-10-01 VITALS — BP 144/80 | HR 83 | Ht 67.0 in | Wt 168.4 lb

## 2019-10-01 DIAGNOSIS — E119 Type 2 diabetes mellitus without complications: Secondary | ICD-10-CM | POA: Diagnosis not present

## 2019-10-01 LAB — POCT GLYCOSYLATED HEMOGLOBIN (HGB A1C): HbA1c, POC (controlled diabetic range): 7.7 % — AB (ref 0.0–7.0)

## 2019-10-01 MED ORDER — METFORMIN HCL 500 MG PO TABS: 500.0000 mg | ORAL_TABLET | Freq: Two times a day (BID) | ORAL | 1 refills | Status: DC

## 2019-10-01 NOTE — Patient Instructions (Addendum)
It was great seeing you today!  Your A1c has increased some to 7.7 from 6.5.  I think starting the Metformin again is a good idea at this point given the sharp increase.  It will be 500 mg twice a day.  I sent this to your pharmacy.  Come back and see me when you are ready your Pap smear done.  Please get your eye exam done and have them send that to me at your convenience.  I see back in around 3 months, you can come a couple weeks earlier if you would like to see me for this issue before I graduate.

## 2019-10-01 NOTE — Progress Notes (Signed)
   CHIEF COMPLAINT / HPI: 59 year old female who presents for diabetes follow-up.  A1c is up to 7.7 from 6.5.  Patient feels this is an unexpected result as she has been eating normally and getting some exercise.  Cites having a dog which "keeps her really active" and has been watching her carbohydrate intake.  PERTINENT  PMH / PSH: Type 2 diabetes   OBJECTIVE: BP (!) 144/80   Pulse 83   Ht 5\' 7"  (1.702 m)   Wt 168 lb 6.4 oz (76.4 kg)   SpO2 96%   BMI 26.38 kg/m   Gen: Very pleasant 59 year old Caucasian female, no acute distress Resp: Nonlabored respirations, no distress Neuro: Alert and oriented, Speech clear, No gross deficits   ASSESSMENT / PLAN:  Diabetes type 2, controlled (HCC) Given the increase of over one-point in 3 months we will start back on Metformin.  We will do 500 mg twice daily for now.  Patient has been on it before and tolerated it very well.  Follow-up in 3 months for A1c check.  I did discuss being very careful as the patient may be eating carbohydrate heavy foods that may not be as apparent. -Begin Metformin 500 mg twice daily -Follow-up A1c check in 3 months     [name] MD PGY-3 Family Medicine Resident [place]

## 2019-10-01 NOTE — Assessment & Plan Note (Signed)
Given the increase of over one-point in 3 months we will start back on Metformin.  We will do 500 mg twice daily for now.  Patient has been on it before and tolerated it very well.  Follow-up in 3 months for A1c check.  I did discuss being very careful as the patient may be eating carbohydrate heavy foods that may not be as apparent. -Begin Metformin 500 mg twice daily -Follow-up A1c check in 3 months

## 2019-10-19 DIAGNOSIS — C20 Malignant neoplasm of rectum: Secondary | ICD-10-CM | POA: Diagnosis not present

## 2019-11-09 ENCOUNTER — Other Ambulatory Visit: Payer: Self-pay

## 2019-11-09 DIAGNOSIS — K219 Gastro-esophageal reflux disease without esophagitis: Secondary | ICD-10-CM

## 2019-11-09 MED ORDER — FAMOTIDINE 20 MG PO TABS: 20.0000 mg | ORAL_TABLET | Freq: Two times a day (BID) | ORAL | 0 refills | Status: DC

## 2019-11-10 ENCOUNTER — Ambulatory Visit: Payer: Medicaid Other | Admitting: Family Medicine

## 2019-11-16 ENCOUNTER — Other Ambulatory Visit: Payer: Self-pay

## 2019-11-16 ENCOUNTER — Inpatient Hospital Stay: Payer: Medicaid Other | Attending: Nurse Practitioner

## 2019-11-16 ENCOUNTER — Inpatient Hospital Stay (HOSPITAL_BASED_OUTPATIENT_CLINIC_OR_DEPARTMENT_OTHER): Payer: Medicaid Other | Admitting: Nurse Practitioner

## 2019-11-16 ENCOUNTER — Telehealth: Payer: Self-pay | Admitting: Oncology

## 2019-11-16 DIAGNOSIS — C20 Malignant neoplasm of rectum: Secondary | ICD-10-CM

## 2019-11-16 DIAGNOSIS — Z9221 Personal history of antineoplastic chemotherapy: Secondary | ICD-10-CM | POA: Diagnosis not present

## 2019-11-16 DIAGNOSIS — Z923 Personal history of irradiation: Secondary | ICD-10-CM | POA: Diagnosis not present

## 2019-11-16 DIAGNOSIS — Z85038 Personal history of other malignant neoplasm of large intestine: Secondary | ICD-10-CM | POA: Insufficient documentation

## 2019-11-16 DIAGNOSIS — Z933 Colostomy status: Secondary | ICD-10-CM | POA: Diagnosis not present

## 2019-11-16 NOTE — Progress Notes (Signed)
  [place] OFFICE PROGRESS NOTE    Wanda Baker completed labs today but did not stay for the office visit.  She will be rescheduled.

## 2019-11-16 NOTE — Telephone Encounter (Signed)
Scheduled appt per 6/1 sch message - pt will call back to get date and time

## 2019-11-17 LAB — CEA (IN HOUSE-CHCC): CEA (CHCC-In House): 1.37 ng/mL (ref 0.00–5.00)

## 2019-11-18 ENCOUNTER — Telehealth: Payer: Self-pay | Admitting: *Deleted

## 2019-11-18 NOTE — Telephone Encounter (Signed)
Notified of normal CEA. F/U as scheduled. 

## 2019-11-25 ENCOUNTER — Encounter: Payer: Self-pay | Admitting: Family Medicine

## 2019-11-25 ENCOUNTER — Other Ambulatory Visit (HOSPITAL_COMMUNITY)
Admission: RE | Admit: 2019-11-25 | Discharge: 2019-11-25 | Disposition: A | Discharge status: Home / Self Care | Payer: Medicaid Other | Source: Ambulatory Visit | Attending: Family Medicine | Admitting: Family Medicine

## 2019-11-25 ENCOUNTER — Other Ambulatory Visit: Payer: Self-pay

## 2019-11-25 ENCOUNTER — Ambulatory Visit (INDEPENDENT_AMBULATORY_CARE_PROVIDER_SITE_OTHER): Payer: Medicaid Other | Admitting: Family Medicine

## 2019-11-25 VITALS — BP 138/78 | HR 83 | Ht 67.0 in | Wt 166.4 lb

## 2019-11-25 DIAGNOSIS — Z124 Encounter for screening for malignant neoplasm of cervix: Secondary | ICD-10-CM | POA: Diagnosis not present

## 2019-11-25 DIAGNOSIS — I4891 Unspecified atrial fibrillation: Secondary | ICD-10-CM

## 2019-11-25 DIAGNOSIS — I10 Essential (primary) hypertension: Secondary | ICD-10-CM

## 2019-11-25 MED ORDER — METOPROLOL TARTRATE 50 MG PO TABS: ORAL_TABLET | ORAL | 0 refills | Status: DC

## 2019-11-25 NOTE — Patient Instructions (Signed)
It was great seeing you today!  Unfortunately you are a little too early to get your hemoglobin A1c, but we were able to get your Pap smear done today.  This result should come back in the next couple of days I will give you call.  I refilled your metoprolol for 90-day supply.  Please let me know if there is some issue at the pharmacy.

## 2019-11-26 ENCOUNTER — Encounter: Payer: Self-pay | Admitting: Family Medicine

## 2019-11-26 DIAGNOSIS — Z124 Encounter for screening for malignant neoplasm of cervix: Secondary | ICD-10-CM | POA: Insufficient documentation

## 2019-11-26 LAB — CYTOLOGY - PAP
Comment: NEGATIVE
Diagnosis: NEGATIVE
High risk HPV: NEGATIVE

## 2019-11-26 NOTE — Progress Notes (Signed)
   CHIEF COMPLAINT / HPI: 59 year old female who presents for Pap smear.  Patient is up-to-date on all other health maintenance items aside from her ophthalmology exam.  States that she understands that she needs to get that done and is working on getting it scheduled.  Patient also requests refill on her metoprolol, which was refilled at 90-day supply.  PERTINENT  PMH / PSH: n/a   OBJECTIVE: BP 138/78   Pulse 83   Ht 5\' 7"  (1.702 m)   Wt 166 lb 6.4 oz (75.5 kg)   SpO2 96%   BMI 26.06 kg/m   Gen: Very pleasant 59 year old Caucasian female, no acute distress CV: Skin warm and dry Resp: Nonlabored, nondistress respiration Neuro: Alert and oriented, Speech clear, No gross deficits  GU: Cervix well visualized, no vaginal atrophy, no cervical motion tenderness  ASSESSMENT / PLAN:  Atrial fibrillation (HCC) Refilled metoprolol, heart rate 83.  Screening for cervical cancer Pap smear performed.  Await results, will update if abnormalities seen.   [name], MD [place]

## 2019-11-26 NOTE — Assessment & Plan Note (Signed)
Refilled metoprolol, heart rate 83.

## 2019-11-26 NOTE — Assessment & Plan Note (Signed)
Pap smear performed.  Await results, will update if abnormalities seen.

## 2019-11-27 DIAGNOSIS — Z23 Encounter for immunization: Secondary | ICD-10-CM | POA: Diagnosis not present

## 2019-12-14 ENCOUNTER — Other Ambulatory Visit: Payer: Self-pay | Admitting: *Deleted

## 2019-12-14 ENCOUNTER — Other Ambulatory Visit: Payer: Self-pay

## 2019-12-14 ENCOUNTER — Telehealth: Payer: Self-pay | Admitting: Oncology

## 2019-12-14 ENCOUNTER — Inpatient Hospital Stay (HOSPITAL_BASED_OUTPATIENT_CLINIC_OR_DEPARTMENT_OTHER): Payer: Medicaid Other | Admitting: Oncology

## 2019-12-14 VITALS — BP 163/84 | HR 87 | Temp 97.7°F | Resp 17 | Ht 67.0 in | Wt 166.7 lb

## 2019-12-14 DIAGNOSIS — Z923 Personal history of irradiation: Secondary | ICD-10-CM | POA: Diagnosis not present

## 2019-12-14 DIAGNOSIS — C20 Malignant neoplasm of rectum: Secondary | ICD-10-CM

## 2019-12-14 DIAGNOSIS — Z9221 Personal history of antineoplastic chemotherapy: Secondary | ICD-10-CM | POA: Diagnosis not present

## 2019-12-14 DIAGNOSIS — Z85038 Personal history of other malignant neoplasm of large intestine: Secondary | ICD-10-CM | POA: Diagnosis not present

## 2019-12-14 DIAGNOSIS — Z933 Colostomy status: Secondary | ICD-10-CM | POA: Diagnosis not present

## 2019-12-14 MED ORDER — GABAPENTIN 400 MG PO CAPS: ORAL_CAPSULE | ORAL | 1 refills | Status: DC

## 2019-12-14 NOTE — Progress Notes (Signed)
  [place] OFFICE PROGRESS NOTE   Diagnosis: Colon cancer  INTERVAL HISTORY:   Wanda Baker returns as scheduled.  She feels well.  Good appetite.  No difficulty with bowel function.  She saw Dr. Dema Severin in May.  She has abdominal hernias.  These are not painful.  Wanda Baker has received the first dose of the COVID-19 vaccine.  Objective:  Vital signs in last 24 hours:  Blood pressure (!) 163/84, pulse 87, temperature 97.7 F (36.5 C), temperature source Temporal, resp. rate 17, height '5\' 7"'$  (1.702 m), weight 166 lb 11.2 oz (75.6 kg), SpO2 99 %.    Lymphatics: No cervical, supraclavicular, axillary, or inguinal nodes Resp: Lungs clear bilaterally Cardio: Regular rate and rhythm GI: No hepatosplenomegaly, no mass, nontender, reducible ventral and left stomal hernias Vascular: No leg edema   Lab Results:  Lab Results  Component Value Date   WBC 9.4 03/29/2018   HGB 12.5 03/29/2018   HCT 38.3 03/29/2018   MCV 88.9 03/29/2018   PLT 221 03/29/2018   NEUTROABS 6.8 03/29/2018    CMP  Lab Results  Component Value Date   NA 140 03/29/2018   K 3.9 03/29/2018   CL 101 03/29/2018   CO2 30 03/29/2018   GLUCOSE 92 03/29/2018   BUN 10 03/29/2018   CREATININE 0.58 03/29/2018   CALCIUM 9.2 03/29/2018   PROT 7.3 03/17/2018   ALBUMIN 4.1 03/17/2018   AST 32 03/17/2018   ALT 27 03/17/2018   ALKPHOS 73 03/17/2018   BILITOT 0.9 03/17/2018   GFRNONAA >60 03/29/2018   GFRAA >60 03/29/2018    Lab Results  Component Value Date   CEA1 1.37 11/16/2019     Medications: I have reviewed the patient's current medications.   Assessment/Plan: 1. 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 Tumor noted at 15 cm on sigmoidoscopy 09/17/2016 ? CT abdomen/pelvis 09/16/2016-colon obstruction, mass at the "distal sigmoid colon " ? Elevated preoperative  CEA ? Low anterior resection 09/18/2016, pT3,pN0, 0/20 lymph nodes positive, extracellular mucin without definitive tumor on the inked serosal surface ? MSI-stable, no loss of mismatch repair protein expression ? Initiation of Xeloda/radiation 11/21/2016; completion of Xeloda/radiation 12/31/2016 ? Cycle 1 adjuvant Xeloda 01/27/2017 ? Cycle 2 adjuvant Xeloda 02/17/2017 ? Cycle 3 adjuvant Xeloda 03/10/2017 ? Cycle 4 adjuvant Xeloda 03/31/2017 (Xeloda dose escalated by one tablet daily) ? Cycle 5 adjuvant Xeloda 04/21/2017 ? Colonoscopy 04/18/2017-tubular adenoma removed from the ascending colon ? Colostomy takedown and creation of an ileostomy 08/08/2017 ? Takedown of diverting ileostomy 03/27/2018 ? Negative colonoscopy 06/04/2018  2. History of a colonic obstruction secondary to #1  3. Iron deficiency anemia secondary to #1. Resolved   Disposition: Wanda Baker is in clinical remission from rectal cancer.  She will return for an office visit and CEA in 8 months.  She will continue colonoscopy follow-up with Dr. Silverio Decamp.  [name], MD  12/14/2019  1:03 PM

## 2019-12-14 NOTE — Telephone Encounter (Signed)
Scheduled appt per 6/29 los - gave patient AVS and calender per los.

## 2019-12-29 ENCOUNTER — Other Ambulatory Visit: Payer: Self-pay | Admitting: *Deleted

## 2019-12-29 MED ORDER — METFORMIN HCL 500 MG PO TABS: 500.0000 mg | ORAL_TABLET | Freq: Two times a day (BID) | ORAL | 1 refills | Status: DC

## 2020-01-21 ENCOUNTER — Ambulatory Visit (INDEPENDENT_AMBULATORY_CARE_PROVIDER_SITE_OTHER): Payer: Medicaid Other | Admitting: Family Medicine

## 2020-01-21 ENCOUNTER — Other Ambulatory Visit: Payer: Self-pay

## 2020-01-21 ENCOUNTER — Encounter: Payer: Self-pay | Admitting: Family Medicine

## 2020-01-21 VITALS — BP 140/64 | HR 90 | Ht 67.0 in | Wt 164.5 lb

## 2020-01-21 DIAGNOSIS — E119 Type 2 diabetes mellitus without complications: Secondary | ICD-10-CM

## 2020-01-21 DIAGNOSIS — E785 Hyperlipidemia, unspecified: Secondary | ICD-10-CM

## 2020-01-21 DIAGNOSIS — S0100XA Unspecified open wound of scalp, initial encounter: Secondary | ICD-10-CM

## 2020-01-21 DIAGNOSIS — I10 Essential (primary) hypertension: Secondary | ICD-10-CM | POA: Diagnosis not present

## 2020-01-21 LAB — POCT GLYCOSYLATED HEMOGLOBIN (HGB A1C): HbA1c, POC (controlled diabetic range): 7.4 % — AB (ref 0.0–7.0)

## 2020-01-21 MED ORDER — METFORMIN HCL 1000 MG PO TABS: 1000.0000 mg | ORAL_TABLET | Freq: Two times a day (BID) | ORAL | 5 refills | Status: DC

## 2020-01-21 MED ORDER — LOSARTAN POTASSIUM 25 MG PO TABS
25.0000 mg | ORAL_TABLET | Freq: Every day | ORAL | 3 refills | Status: DC
Start: 2020-01-21 — End: 2020-10-11

## 2020-01-21 MED ORDER — DOXYCYCLINE HYCLATE 100 MG PO TABS: 100.0000 mg | ORAL_TABLET | Freq: Two times a day (BID) | ORAL | 0 refills | Status: DC

## 2020-01-21 NOTE — Progress Notes (Signed)
21 

## 2020-01-21 NOTE — Assessment & Plan Note (Signed)
This appears to be secondary to a burn from a hair straightener.  There was certainly some pus collection which was expressed today in clinic thoroughly.  She was encouraged to continue using topical antibiotics and was prescribed p.o. doxycycline for 10 days.  She was encouraged to return to clinic in 1 week to ensure that it was well-healing. -Doxycycline 100 mg twice daily -Continue topical antibiotics -Return to clinic in 1 week

## 2020-01-21 NOTE — Progress Notes (Signed)
    SUBJECTIVE:   CHIEF COMPLAINT / HPI:   Scalp wound Wanda Baker reports that she has had a significant right head wound for the past 3-7 days.  She thinks that she was burned with a hair straightener recently and it has evolved into an infection.  She has been putting topical antibiotics over it for the past several days but it seems to be getting worse and she would like a doctor to take a look at it.  Overall, she feels well but has significant tenderness over her right ear.  She said that her niece recently without it looked "gross."  Diabetes Her current diabetes regimen includes Metformin 500 mg twice daily.  Her other diabetes related medications include atorvastatin.  Her A1c today is slightly improved at 7.4 down from 7.7.   Hypertension She is previously been prescribed metoprolol for heart palpitations and mildly elevated blood pressure.  Blood pressure today is elevated to 505 systolic and was decreased to 140 on recheck.  Chart review shows that she is frequently above 140.  PERTINENT  PMH / PSH: Diabetes, hypertension  OBJECTIVE:   BP 140/64 Comment: 2nd attempt  Pulse 90   Ht 5\' 7"  (1.702 m)   Wt 164 lb 8 oz (74.6 kg)   SpO2 97%   BMI 25.76 kg/m    General: Well-appearing woman seated comfortably in her chair in the exam room. HEENT: 1.5X 1.0 cm raised, crusted lesion over her right parietal scalp.  Purulent drainage was easily expressed in the crusted component was unroofed.  This revealed an erythematous lump that could not be further expressed. Respiratory: Breathing comfortably on room air.  No respiratory distress Skin: Warm, dry  Diabetic Foot Exam - Simple   Simple Foot Form Diabetic Foot exam was performed with the following findings: Yes 01/21/2020  3:28 PM  Visual Inspection No deformities, no ulcerations, no other skin breakdown bilaterally: Yes Sensation Testing Intact to touch and monofilament testing bilaterally: Yes Pulse Check See comments:  Yes Comments PT palpated bilaterally.  DP only palpated on left foot.       ASSESSMENT/PLAN:   Scalp wound This appears to be secondary to a burn from a hair straightener.  There was certainly some pus collection which was expressed today in clinic thoroughly.  She was encouraged to continue using topical antibiotics and was prescribed p.o. doxycycline for 10 days.  She was encouraged to return to clinic in 1 week to ensure that it was well-healing. -Doxycycline 100 mg twice daily -Continue topical antibiotics -Return to clinic in 1 week  Diabetes type 2, controlled ([place]) Some improvement since last A1c check.  She was encouraged to increase her Metformin to 1000 mg twice daily -Increase Metformin to 1000 mg twice daily -Return to clinic in 3 months. -Losartan started for renal protection and hypertension  HYPERTENSION, BENIGN ESSENTIAL Chart review reveals frequent elevated blood pressures over 397 systolic.  Discussed starting new medication today.  We will start losartan for hypertension and renal protection. -Start losartan 25 mg daily -Check BMP in 1 week when she returns for wound check     Wanda Haymaker, MD [place]

## 2020-01-21 NOTE — Assessment & Plan Note (Signed)
Some improvement since last A1c check.  She was encouraged to increase her Metformin to 1000 mg twice daily -Increase Metformin to 1000 mg twice daily -Return to clinic in 3 months. -Losartan started for renal protection and hypertension

## 2020-01-21 NOTE — Assessment & Plan Note (Signed)
Chart review reveals frequent elevated blood pressures over 331 systolic.  Discussed starting new medication today.  We will start losartan for hypertension and renal protection. -Start losartan 25 mg daily -Check BMP in 1 week when she returns for wound check

## 2020-01-21 NOTE — Patient Instructions (Addendum)
Scalp abscess: I am sorry you had this injury to your head.  Does look uncomfortable.  I think we drained a lot of the infection today.  Lets do antibiotics for the next 10 days.  Come back in 1 week to make sure it is getting better and we do not need to drain anymore.  Hypertension: I think it would be helpful to start another medication for your high blood pressure which will also help protect your kidneys.  We can recheck some blood work when you return to clinic in 1 week.  Diabetes: Lets increase your Metformin to 1000 mg in the morning and 1000 mg in the evening.  For now, you can take 2 pills of 500 mg for each dose.  Your next refill, I will give you pills of 1000 mg apiece.

## 2020-01-22 ENCOUNTER — Encounter: Payer: Self-pay | Admitting: Family Medicine

## 2020-01-22 LAB — LIPID PANEL
Chol/HDL Ratio: 3.1 ratio (ref 0.0–4.4)
Cholesterol, Total: 94 mg/dL — ABNORMAL LOW (ref 100–199)
HDL: 30 mg/dL — ABNORMAL LOW (ref >39)
LDL Chol Calc (NIH): 11 mg/dL (ref 0–99)
Triglycerides: 388 mg/dL — ABNORMAL HIGH (ref 0–149)
VLDL Cholesterol Cal: 53 mg/dL — ABNORMAL HIGH (ref 5–40)

## 2020-01-31 ENCOUNTER — Ambulatory Visit (INDEPENDENT_AMBULATORY_CARE_PROVIDER_SITE_OTHER): Payer: Medicaid Other | Admitting: Family Medicine

## 2020-01-31 ENCOUNTER — Other Ambulatory Visit: Payer: Self-pay

## 2020-01-31 ENCOUNTER — Encounter: Payer: Self-pay | Admitting: Family Medicine

## 2020-01-31 VITALS — BP 134/64 | HR 85 | Ht 67.0 in | Wt 165.5 lb

## 2020-01-31 DIAGNOSIS — I10 Essential (primary) hypertension: Secondary | ICD-10-CM

## 2020-01-31 DIAGNOSIS — S0100XD Unspecified open wound of scalp, subsequent encounter: Secondary | ICD-10-CM | POA: Diagnosis not present

## 2020-01-31 NOTE — Assessment & Plan Note (Signed)
Well-healing.  No need for further treatment or medication at this time.

## 2020-01-31 NOTE — Progress Notes (Signed)
° ° °  SUBJECTIVE:   CHIEF COMPLAINT / HPI:   Infected head injury Wanda Baker is seen in clinic last week after a burn from a hair straightener had become infected.  Her infection was incised and drained at the previous appointment she was given a course of antibiotics.  She returns to clinic today and feels significantly improved.  She has much less pain and believes that her infection is well controlled and healing without issue.  She denies any recent fevers or chills.  Hypertension Losartan was started at her last visit.  She denies any lightheadedness or dizziness with standing.  PERTINENT  PMH / PSH: Hypertension  OBJECTIVE:   BP 134/64    Pulse 85    Ht 5\' 7"  (1.702 m)    Wt 165 lb 8 oz (75.1 kg)    SpO2 98%    BMI 25.92 kg/m    General: Seated comfortably in the exam room.  No acute distress. HEENT: Well-healing wound of the right parietal area.  No evidence of purulence or drainage.  The erythema appears entirely resolved.  Media Information   Document Information  Photos    01/31/2020 10:48  Attached To:  Office Visit on 01/31/20 with Matilde Haymaker, MD  Source Information  Matilde Haymaker, MD   [facility]     ASSESSMENT/PLAN:   HYPERTENSION, BENIGN ESSENTIAL Well-controlled today. -Continue losartan -Follow-up BMP  Scalp wound Well-healing.  No need for further treatment or medication at this time.     Matilde Haymaker, MD [place]

## 2020-01-31 NOTE — Patient Instructions (Signed)
Scalp wound: Your scalp wound is looking much better than last week.  I do think will take a little bit of time for the hair to grow in.  You do not need to be seen again for this unless you notice that it comes back or gets worse.  Hypertension: Blood pressure is looking improved today.  We will get some lab work to make sure your kidneys are handling your new medication okay.  Come back in 3 months for diabetes and blood pressure checkup.

## 2020-01-31 NOTE — Assessment & Plan Note (Signed)
Well-controlled today. -Continue losartan -Follow-up BMP

## 2020-02-01 ENCOUNTER — Encounter: Payer: Self-pay | Admitting: Family Medicine

## 2020-02-01 LAB — BASIC METABOLIC PANEL
BUN/Creatinine Ratio: 13 (ref 9–23)
BUN: 7 mg/dL (ref 6–24)
CO2: 25 mmol/L (ref 20–29)
Calcium: 10 mg/dL (ref 8.7–10.2)
Chloride: 102 mmol/L (ref 96–106)
Creatinine, Ser: 0.56 mg/dL — ABNORMAL LOW (ref 0.57–1.00)
GFR calc Af Amer: 118 mL/min/{1.73_m2} (ref >59)
GFR calc non Af Amer: 102 mL/min/{1.73_m2} (ref >59)
Glucose: 150 mg/dL — ABNORMAL HIGH (ref 65–99)
Potassium: 3.9 mmol/L (ref 3.5–5.2)
Sodium: 141 mmol/L (ref 134–144)

## 2020-02-08 ENCOUNTER — Other Ambulatory Visit: Payer: Self-pay | Admitting: Family Medicine

## 2020-02-08 DIAGNOSIS — K219 Gastro-esophageal reflux disease without esophagitis: Secondary | ICD-10-CM

## 2020-02-22 ENCOUNTER — Other Ambulatory Visit: Payer: Self-pay | Admitting: Family Medicine

## 2020-02-22 DIAGNOSIS — I10 Essential (primary) hypertension: Secondary | ICD-10-CM

## 2020-04-12 ENCOUNTER — Other Ambulatory Visit: Payer: Self-pay | Admitting: Family Medicine

## 2020-04-17 ENCOUNTER — Other Ambulatory Visit: Payer: Self-pay

## 2020-04-17 ENCOUNTER — Ambulatory Visit (INDEPENDENT_AMBULATORY_CARE_PROVIDER_SITE_OTHER): Payer: Medicaid Other | Admitting: Family Medicine

## 2020-04-17 ENCOUNTER — Encounter: Payer: Self-pay | Admitting: Family Medicine

## 2020-04-17 VITALS — BP 130/70 | HR 64 | Ht 67.0 in | Wt 163.0 lb

## 2020-04-17 DIAGNOSIS — Z23 Encounter for immunization: Secondary | ICD-10-CM | POA: Diagnosis not present

## 2020-04-17 DIAGNOSIS — I10 Essential (primary) hypertension: Secondary | ICD-10-CM | POA: Diagnosis not present

## 2020-04-17 DIAGNOSIS — E119 Type 2 diabetes mellitus without complications: Secondary | ICD-10-CM | POA: Diagnosis not present

## 2020-04-17 LAB — POCT GLYCOSYLATED HEMOGLOBIN (HGB A1C): Hemoglobin A1C: 7.1 % — AB (ref 4.0–5.6)

## 2020-04-17 MED ORDER — ZOSTER VAC RECOMB ADJUVANTED 50 MCG/0.5ML IM SUSR: 0.5000 mL | Freq: Once | INTRAMUSCULAR | 0 refills | Status: AC

## 2020-04-17 MED ORDER — ATORVASTATIN CALCIUM 40 MG PO TABS
40.0000 mg | ORAL_TABLET | Freq: Every day | ORAL | 0 refills | Status: DC
Start: 2020-04-17 — End: 2020-11-02

## 2020-04-17 NOTE — Assessment & Plan Note (Signed)
A1c improved today at 7.1 decreased from 7.4. -Continue Metformin 1000 mg twice daily -Continue atorvastatin 20 mg -Continue losartan 25 mg -Continue to focus on nutrition and exercise.

## 2020-04-17 NOTE — Progress Notes (Signed)
    SUBJECTIVE:   CHIEF COMPLAINT / HPI:   Diabetes Her current diabetes medicine includes: -metformin 1000 mg BID -Atorvastatin 20 mg -losartan 25 mg She does not check her blood sugars. She reports that she has been making efforts to walk her dog regularly. She has been compliant with her recent medication change and increasing her Metformin.  Hypertension She currently takes losartan 25 mg for hypertension. She denies any lightheadedness or dizziness when moving from a seated to standing position.  PERTINENT  PMH / PSH: Hypertension, diabetes, hyperlipidemia  OBJECTIVE:   BP 130/70   Pulse 64   Ht 5\' 7"  (1.702 m)   Wt 163 lb (73.9 kg)   BMI 25.53 kg/m   General: Alert and cooperative and appears to be in no acute distress HEENT: Neck non-tender without lymphadenopathy, masses or thyromegaly Cardio: Normal S1 and S2, no S3 or S4. Rhythm is regular. No murmurs or rubs.   Pulm: Clear to auscultation bilaterally, no crackles, wheezing, or diminished breath sounds. Normal respiratory effort Abdomen: Bowel sounds normal. Abdomen soft and non-tender.  Extremities: No peripheral edema. Warm/ well perfused.  Strong radial pulse. Neuro: Cranial nerves grossly intact  ASSESSMENT/PLAN:   Diabetes type 2, controlled ([facility]) A1c improved today at 7.1 decreased from 7.4. -Continue Metformin 1000 mg twice daily -Continue atorvastatin 20 mg -Continue losartan 25 mg -Continue to focus on nutrition and exercise.  HYPERTENSION, BENIGN ESSENTIAL Well-controlled today. -Continue losartan 25 mg   Health maintenance -Flu vaccine administered today -Prescribed Shingrix to be administered at her pharmacy  During our visit, we discussed increasing her atorvastatin to 40 mg daily. Following her visit, she was called and told not to increase her atorvastatin and to continue taking 20 mg daily.  Matilde Haymaker, MD [place]

## 2020-04-17 NOTE — Patient Instructions (Addendum)
Shingles vaccine: I do recommend that you get the shingles vaccine, called Shingrix.  We do not have this vaccine in our clinic.  I have sent in an order to your pharmacy.  This vaccine consist of 2 doses separated by at least 4 weeks.  It will likely make you feel very crummy the day following the vaccine.  Diabetes: Your A1c today was 7.1.  Slightly improved from 7.4.  Continue taking your current medicine and focus on nutrition and exercise.  I think we can follow-up in 6 months.  Blood pressure: Your blood pressure is well controlled today.  No changes at this time.  Cholesterol: Lets increase your cholesterol medicine to atorvastatin 40 mg daily.  The increase is recommended for all patients with diabetes.

## 2020-04-17 NOTE — Assessment & Plan Note (Signed)
Well-controlled today. -Continue losartan 25 mg

## 2020-05-17 ENCOUNTER — Other Ambulatory Visit: Payer: Self-pay | Admitting: Family Medicine

## 2020-05-17 DIAGNOSIS — I10 Essential (primary) hypertension: Secondary | ICD-10-CM

## 2020-05-23 DIAGNOSIS — C20 Malignant neoplasm of rectum: Secondary | ICD-10-CM | POA: Diagnosis not present

## 2020-05-26 ENCOUNTER — Other Ambulatory Visit: Payer: Self-pay | Admitting: Surgery

## 2020-05-26 DIAGNOSIS — C20 Malignant neoplasm of rectum: Secondary | ICD-10-CM

## 2020-06-15 ENCOUNTER — Ambulatory Visit
Admission: RE | Admit: 2020-06-15 | Discharge: 2020-06-15 | Disposition: A | Discharge status: Home / Self Care | Payer: Medicaid Other | Source: Ambulatory Visit | Attending: Surgery | Admitting: Surgery

## 2020-06-15 DIAGNOSIS — R918 Other nonspecific abnormal finding of lung field: Secondary | ICD-10-CM | POA: Diagnosis not present

## 2020-06-15 DIAGNOSIS — C20 Malignant neoplasm of rectum: Secondary | ICD-10-CM

## 2020-06-15 DIAGNOSIS — K439 Ventral hernia without obstruction or gangrene: Secondary | ICD-10-CM | POA: Diagnosis not present

## 2020-06-15 MED ORDER — IOPAMIDOL (ISOVUE-300) INJECTION 61%
100.0000 mL | Freq: Once | INTRAVENOUS | Status: AC | PRN
  Administered 2020-06-15: 100 mL via INTRAVENOUS

## 2020-07-18 ENCOUNTER — Other Ambulatory Visit: Payer: Self-pay | Admitting: Family Medicine

## 2020-07-18 DIAGNOSIS — I10 Essential (primary) hypertension: Secondary | ICD-10-CM

## 2020-07-19 ENCOUNTER — Other Ambulatory Visit: Payer: Self-pay | Admitting: Anesthesiology

## 2020-07-19 ENCOUNTER — Other Ambulatory Visit: Payer: Self-pay | Admitting: Family Medicine

## 2020-07-19 DIAGNOSIS — Z1231 Encounter for screening mammogram for malignant neoplasm of breast: Secondary | ICD-10-CM

## 2020-08-08 ENCOUNTER — Other Ambulatory Visit: Payer: Self-pay | Admitting: Family Medicine

## 2020-08-08 DIAGNOSIS — K219 Gastro-esophageal reflux disease without esophagitis: Secondary | ICD-10-CM

## 2020-08-14 ENCOUNTER — Inpatient Hospital Stay: Payer: Medicaid Other | Attending: Oncology | Admitting: Oncology

## 2020-08-14 ENCOUNTER — Telehealth: Payer: Self-pay

## 2020-08-14 ENCOUNTER — Telehealth: Payer: Self-pay | Admitting: Oncology

## 2020-08-14 ENCOUNTER — Inpatient Hospital Stay: Payer: Medicaid Other

## 2020-08-14 ENCOUNTER — Other Ambulatory Visit: Payer: Self-pay

## 2020-08-14 VITALS — BP 147/80 | HR 87 | Temp 97.8°F | Resp 18 | Ht 67.0 in | Wt 167.4 lb

## 2020-08-14 DIAGNOSIS — C20 Malignant neoplasm of rectum: Secondary | ICD-10-CM | POA: Insufficient documentation

## 2020-08-14 LAB — CEA (IN HOUSE-CHCC): CEA (CHCC-In House): 1.52 ng/mL (ref 0.00–5.00)

## 2020-08-14 NOTE — Telephone Encounter (Signed)
Spoke with patient concerning most recent CEA results provider recommends pt f/u as scheduled

## 2020-08-14 NOTE — Telephone Encounter (Signed)
-----   Message from Ladell Pier, MD sent at 08/14/2020  2:15 PM EST ----- Please call patient, CEA is normal, follow-up scheduled

## 2020-08-14 NOTE — Progress Notes (Signed)
  [facility] OFFICE PROGRESS NOTE   Diagnosis: Rectal cancer  INTERVAL HISTORY:   Wanda Baker returns as scheduled. She feels well. Good appetite and energy level. No difficulty with bowel function. No bleeding. No complaint.  Objective:  Vital signs in last 24 hours:  Blood pressure (!) 147/80, pulse 87, temperature 97.8 F (36.6 C), temperature source Tympanic, resp. rate 18, height _0  (1.702 m), weight 167 lb 6.4 oz (75.9 kg), SpO2 100 %.    HEENT: Neck without mass Lymphatics: No cervical, supraclavicular, axillary, or inguinal nodes Resp: Lungs clear bilaterally Cardio: Regular rate and rhythm GI: No mass, no hepatosplenomegaly, reducible ventral hernia, nontender Vascular: No leg edema   Lab Results:  Lab Results  Component Value Date   WBC 9.4 03/29/2018   HGB 12.5 03/29/2018   HCT 38.3 03/29/2018   MCV 88.9 03/29/2018   PLT 221 03/29/2018   NEUTROABS 6.8 03/29/2018    CMP  Lab Results  Component Value Date   NA 141 01/31/2020   K 3.9 01/31/2020   CL 102 01/31/2020   CO2 25 01/31/2020   GLUCOSE 150 (H) 01/31/2020   BUN 7 01/31/2020   CREATININE 0.56 (L) 01/31/2020   CALCIUM 10.0 01/31/2020   PROT 7.3 03/17/2018   ALBUMIN 4.1 03/17/2018   AST 32 03/17/2018   ALT 27 03/17/2018   ALKPHOS 73 03/17/2018   BILITOT 0.9 03/17/2018   GFRNONAA 102 01/31/2020   GFRAA 118 01/31/2020    Lab Results  Component Value Date   CEA1 1.37 11/16/2019     Medications: I have reviewed the patient's current medications.   Assessment/Plan: 1. 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 Tumor noted at 15 cm on sigmoidoscopy 09/17/2016 ? CT abdomen/pelvis 09/16/2016-colon obstruction, mass at the "distal sigmoid colon " ? Elevated preoperative CEA ? Low anterior resection 09/18/2016, pT3,pN0, 0/20 lymph nodes positive,  extracellular mucin without definitive tumor on the inked serosal surface ? MSI-stable, no loss of mismatch repair protein expression ? Initiation of Xeloda/radiation 11/21/2016; completion of Xeloda/radiation 12/31/2016 ? Cycle 1 adjuvant Xeloda 01/27/2017 ? Cycle 2 adjuvant Xeloda 02/17/2017 ? Cycle 3 adjuvant Xeloda 03/10/2017 ? Cycle 4 adjuvant Xeloda 03/31/2017 (Xeloda dose escalated by one tablet daily) ? Cycle 5 adjuvant Xeloda 04/21/2017 ? Colonoscopy 04/18/2017-tubular adenoma removed from the ascending colon ? Colostomy takedown and creation of an ileostomy 08/08/2017 ? Takedown of diverting ileostomy 03/27/2018 ? Negative colonoscopy 06/04/2018 ? CTs 06/15/2020-no evidence of recurrent disease  2. History of a colonic obstruction secondary to #1  3. Iron deficiency anemia secondary to #1. Resolved     Disposition: Wanda Baker is in remission from rectal cancer. We will follow up on the CEA from today. She will return for an office visit and CEA in 8 months.  She is due for colonoscopy in December of this year. I made a referral to Dr. Silverio Decamp.  She reports that she will see Dr. Dema Severin with restaging CTs in December.  [name], MD  08/14/2020  9:41 AM

## 2020-08-14 NOTE — Telephone Encounter (Signed)
Scheduled per los. Gave avs and calendar  

## 2020-08-16 ENCOUNTER — Other Ambulatory Visit: Payer: Self-pay | Admitting: Family Medicine

## 2020-09-05 ENCOUNTER — Other Ambulatory Visit: Payer: Self-pay

## 2020-09-05 ENCOUNTER — Ambulatory Visit
Admission: RE | Admit: 2020-09-05 | Discharge: 2020-09-05 | Disposition: A | Discharge status: Home / Self Care | Payer: Medicaid Other | Source: Ambulatory Visit | Attending: Psychiatry | Admitting: Psychiatry

## 2020-09-05 DIAGNOSIS — Z1231 Encounter for screening mammogram for malignant neoplasm of breast: Secondary | ICD-10-CM

## 2020-09-07 ENCOUNTER — Other Ambulatory Visit: Payer: Self-pay

## 2020-09-07 ENCOUNTER — Ambulatory Visit (INDEPENDENT_AMBULATORY_CARE_PROVIDER_SITE_OTHER): Payer: Medicaid Other | Admitting: Family Medicine

## 2020-09-07 ENCOUNTER — Encounter: Payer: Self-pay | Admitting: Family Medicine

## 2020-09-07 VITALS — BP 130/70 | HR 87 | Ht 67.0 in | Wt 168.0 lb

## 2020-09-07 DIAGNOSIS — M25562 Pain in left knee: Secondary | ICD-10-CM | POA: Diagnosis not present

## 2020-09-07 MED ORDER — DICLOFENAC SODIUM 1 % EX GEL: 2.0000 g | Freq: Four times a day (QID) | CUTANEOUS | 1 refills | Status: AC

## 2020-09-07 NOTE — Patient Instructions (Signed)
Knee pain: I am suspicious that this is likely arthritis of the knee.  For now, lets start with knee x-rays and topical Voltaren gel.  I will put in a prescription for you but you can also buy it over-the-counter without a prescription.  It is okay to use Voltaren gel from the family member.  As long as the x-rays do not show any bony problems apart from arthritis, I think we should plan on using Voltaren gel at this time and we can talk about other pain control possibilities if your pain is not well controlled with Voltaren gel.

## 2020-09-07 NOTE — Assessment & Plan Note (Addendum)
She does not recall any evidence of trauma.  I have a low suspicion for stress fracture or acute fracture.  Low suspicion for soft tissue injury based on physical exam.  Did not palpate any obvious Baker's cyst.  I have a high suspicion for osteoarthritis.  We will start off with formal x-rays and topical Voltaren gel.  We discussed the typical treatment for osteoarthritis including: Topical Voltaren gel, oral NSAIDs (she is not particularly interested because of GI upset with Motrin), steroid injections and, potentially, knee replacement.  For now, topical Voltaren gel is likely all that is needed. -Follow-up x-rays -Treat as needed with Voltaren gel

## 2020-09-07 NOTE — Progress Notes (Signed)
    SUBJECTIVE:   CHIEF COMPLAINT / HPI:   Left knee pain Ms. Broadfoot reports that she has been having an achy left knee pain for about the past month.  She denies any history of trauma.  She describes his pain is a strong, aching sensation starting on several inches below the knee and extending to several inches above the knee.  She notices this most with walking around.  She also notices occasional crackles of this knee.  She has not noted any clicks.  Her knee has not been locking up or giving out.  She recalls that she is previously had some presignificant trauma to this knee though has never had surgery.  She is not currently taking any medication to help with this discomfort.  PERTINENT  PMH / PSH: Hypertension, diabetes  OBJECTIVE:   BP 130/70   Pulse 87   Ht 5\' 7"  (1.702 m)   Wt 168 lb (76.2 kg)   SpO2 97%   BMI 26.31 kg/m    General: Seated comfortably in her chair in the exam room.  Good spirits, pleasant, interactive.  Able to walk around the exam room without significant discomfort.  No obvious antalgic gait. Respiratory: Breathing comfortably on room air.  No respiratory distress.  Left knee Inspection: Grossly normal, no evidence of skin changes or obvious deformity. Palpation: No significant tenderness with palpation of the anterior or posterior knee.  No tenderness with palpation of the anterior tibial plateau or tibial tuberosity.  No discomfort with manipulation of the patella. ROM: Full passive and active range of motion although some discomfort with extension of the knee. Special testing: Negative Thessaly test   ASSESSMENT/PLAN:   Left knee pain She does not recall any evidence of trauma.  I have a low suspicion for stress fracture or acute fracture.  Low suspicion for soft tissue injury based on physical exam.  Did not palpate any obvious Baker's cyst.  I have a high suspicion for osteoarthritis.  We will start off with formal x-rays and topical Voltaren gel.   We discussed the typical treatment for osteoarthritis including: Topical Voltaren gel, oral NSAIDs (she is not particularly interested because of GI upset with Motrin), steroid injections and, potentially, knee replacement.  For now, topical Voltaren gel is likely all that is needed. -Follow-up x-rays -Treat as needed with Voltaren gel     Matilde Haymaker, MD [place]

## 2020-09-11 ENCOUNTER — Ambulatory Visit
Admission: RE | Admit: 2020-09-11 | Discharge: 2020-09-11 | Disposition: A | Discharge status: Home / Self Care | Payer: Medicaid Other | Source: Ambulatory Visit | Attending: Family Medicine | Admitting: Family Medicine

## 2020-09-11 DIAGNOSIS — M25562 Pain in left knee: Secondary | ICD-10-CM | POA: Diagnosis not present

## 2020-09-11 DIAGNOSIS — M7989 Other specified soft tissue disorders: Secondary | ICD-10-CM | POA: Diagnosis not present

## 2020-10-04 ENCOUNTER — Telehealth: Payer: Self-pay

## 2020-10-04 NOTE — Telephone Encounter (Signed)
Received phone call from patient regarding issues with prescriptions being filled under medicaid. Per pharmacist, PCP is no longer listed as Medicaid provider. Gave verbal authorization for attending physician, Dr. Nori Riis. Called patient and informed of update.   Talbot Grumbling, RN

## 2020-10-11 ENCOUNTER — Other Ambulatory Visit: Payer: Self-pay

## 2020-10-11 MED ORDER — LOSARTAN POTASSIUM 25 MG PO TABS: 25.0000 mg | ORAL_TABLET | Freq: Every day | ORAL | 3 refills | Status: DC

## 2020-10-18 ENCOUNTER — Other Ambulatory Visit: Payer: Self-pay | Admitting: Family Medicine

## 2020-10-18 DIAGNOSIS — I10 Essential (primary) hypertension: Secondary | ICD-10-CM

## 2020-10-19 ENCOUNTER — Other Ambulatory Visit: Payer: Self-pay

## 2020-10-23 MED ORDER — GABAPENTIN 400 MG PO CAPS: ORAL_CAPSULE | ORAL | 1 refills | Status: DC

## 2020-10-24 MED ORDER — GABAPENTIN 400 MG PO CAPS: ORAL_CAPSULE | ORAL | 1 refills | Status: DC

## 2020-10-24 NOTE — Addendum Note (Signed)
Addended by: Talbot Grumbling on: 10/24/2020 11:19 AM   Modules accepted: Orders

## 2020-10-24 NOTE — Telephone Encounter (Signed)
Patient calls nurse line regarding gabapentin rx. Patient needs provider that is registered with [facility] to send in rx.   Spoke with Dr. Nori Riis. Will send per verbal order.   Talbot Grumbling, RN

## 2020-10-25 ENCOUNTER — Other Ambulatory Visit: Payer: Self-pay

## 2020-10-25 ENCOUNTER — Encounter: Payer: Self-pay | Admitting: Family Medicine

## 2020-10-25 ENCOUNTER — Ambulatory Visit (INDEPENDENT_AMBULATORY_CARE_PROVIDER_SITE_OTHER): Payer: Medicaid Other | Admitting: Family Medicine

## 2020-10-25 VITALS — BP 130/60 | HR 90 | Ht 67.0 in | Wt 166.2 lb

## 2020-10-25 DIAGNOSIS — I10 Essential (primary) hypertension: Secondary | ICD-10-CM

## 2020-10-25 DIAGNOSIS — E119 Type 2 diabetes mellitus without complications: Secondary | ICD-10-CM

## 2020-10-25 LAB — POCT GLYCOSYLATED HEMOGLOBIN (HGB A1C): HbA1c, POC (controlled diabetic range): 8.2 % — AB (ref 0.0–7.0)

## 2020-10-25 MED ORDER — METFORMIN HCL 1000 MG PO TABS: 1000.0000 mg | ORAL_TABLET | Freq: Two times a day (BID) | ORAL | 5 refills | Status: DC

## 2020-10-25 MED ORDER — LOSARTAN POTASSIUM 25 MG PO TABS: 25.0000 mg | ORAL_TABLET | Freq: Every day | ORAL | 3 refills | Status: DC

## 2020-10-25 MED ORDER — EMPAGLIFLOZIN 10 MG PO TABS: 10.0000 mg | ORAL_TABLET | Freq: Every day | ORAL | 3 refills | Status: DC

## 2020-10-25 NOTE — Patient Instructions (Signed)
It was great to see you today.  Here is a quick review of the things we talked about:   Diabetes: Your A1c is slightly worsened today at 8.2.  I think it is time for [place] to start an additional medication.  I would like to start a medication called Jardiance, also called empagliflozin.  You can take this every day.  Lets have you come back to clinic in 3 months to reassess how you are doing.  Remember, it is important to have a diabetic eye exam performed once a year to make sure you not having any trouble with eye complications of diabetes.  Hypertension: Your blood pressure looks appropriate today.

## 2020-10-25 NOTE — Progress Notes (Signed)
    SUBJECTIVE:   CHIEF COMPLAINT / HPI:   Diabetes -metformin 1000 mg BID -Losartan 25 mg daily  -atorvastatin 40 mg daily She reports that she has been taking her metformin daily in addition to pay attention to her nutrition and exercising daily by walking her dog.  She has not had a diabetic eye exam since 2015.  Hypertension -losartan 25 mg QD -metoprolol 100 mg QD She reports good adherence with her medication regimen.   PERTINENT  PMH / PSH: Diabetes, hypertension hyperlipidemia  OBJECTIVE:   BP 130/60   Pulse 90   Ht 5\' 7"  (1.702 m)   Wt 166 lb 4 oz (75.4 kg)   SpO2 97%   BMI 26.04 kg/m    General: Alert and cooperative and appears to be in no acute distress Cardio: Normal S1 and S2, no S3 or S4. Rhythm is regular. No murmurs or rubs.   Pulm: Clear to auscultation bilaterally, no crackles, wheezing, or diminished breath sounds. Normal respiratory effort Abdomen: Bowel sounds normal. Abdomen soft and non-tender.  Extremities: No peripheral edema. Warm/ well perfused.  Strong radial pulses. Neuro: Cranial nerves grossly intact  ASSESSMENT/PLAN:   HYPERTENSION, BENIGN ESSENTIAL Well-controlled.  No changes at this time.  Normal kidney function 6 months ago.  No labs needed at this time.  Diabetes type 2, controlled ([place]) Worsened A1c of 8.2 today.  She reports adherence with her metformin 1000 mg twice daily.  We discussed starting a new medication today she preferred pills over injections.  We discussed the common complications of SGLT2 inhibitors. -Start Jardiance 10 mg daily -Continue metformin 1000 mg twice daily -Follow-up with ophthalmologist for diabetic eye exam -Return to clinic in 3 months for follow-up visit   Health maintenance She reports that she has received 3 vaccines for COVID her most recent being in January of this year.  She is not due for a fourth vaccine.  Matilde Haymaker, MD [place]

## 2020-10-25 NOTE — Assessment & Plan Note (Signed)
Well-controlled.  No changes at this time.  Normal kidney function 6 months ago.  No labs needed at this time.

## 2020-10-25 NOTE — Assessment & Plan Note (Signed)
Worsened A1c of 8.2 today.  She reports adherence with her metformin 1000 mg twice daily.  We discussed starting a new medication today she preferred pills over injections.  We discussed the common complications of SGLT2 inhibitors. -Start Jardiance 10 mg daily -Continue metformin 1000 mg twice daily -Follow-up with ophthalmologist for diabetic eye exam -Return to clinic in 3 months for follow-up visit

## 2020-10-25 NOTE — Progress Notes (Signed)
Ms. Fogelman called and asked to return to clinic in 2 weeks for lab check to ensure her kidney function has not changed. -Placed future order for BMP

## 2020-10-25 NOTE — Addendum Note (Signed)
Addended by: Grant Ruts on: 10/25/2020 01:47 PM   Modules accepted: Orders

## 2020-11-02 ENCOUNTER — Other Ambulatory Visit: Payer: Self-pay

## 2020-11-06 ENCOUNTER — Other Ambulatory Visit: Payer: Medicaid Other

## 2020-11-06 ENCOUNTER — Other Ambulatory Visit: Payer: Self-pay

## 2020-11-06 DIAGNOSIS — E119 Type 2 diabetes mellitus without complications: Secondary | ICD-10-CM

## 2020-11-06 MED ORDER — ATORVASTATIN CALCIUM 40 MG PO TABS: 40.0000 mg | ORAL_TABLET | Freq: Every day | ORAL | 0 refills | Status: DC

## 2020-11-07 LAB — BASIC METABOLIC PANEL
BUN/Creatinine Ratio: 13 (ref 12–28)
BUN: 8 mg/dL (ref 8–27)
CO2: 24 mmol/L (ref 20–29)
Calcium: 9.9 mg/dL (ref 8.7–10.3)
Chloride: 102 mmol/L (ref 96–106)
Creatinine, Ser: 0.63 mg/dL (ref 0.57–1.00)
Glucose: 188 mg/dL — ABNORMAL HIGH (ref 65–99)
Potassium: 4.3 mmol/L (ref 3.5–5.2)
Sodium: 141 mmol/L (ref 134–144)
eGFR: 101 mL/min/{1.73_m2} (ref >59)

## 2020-11-09 ENCOUNTER — Encounter: Payer: Self-pay | Admitting: Family Medicine

## 2020-11-14 ENCOUNTER — Telehealth: Payer: Self-pay

## 2020-11-14 NOTE — Telephone Encounter (Signed)
Patient calls nurse line stating she has been on Jaridance for a few weeks and has developed a yeast infection. Patient would like diflucan sent to her pharmacy. Patient is also asking to have atorvastatin refilled. Both meds need to be sent under an attending. Please let me know if diflucan is ok and I will send both at once under preceptor.

## 2020-11-15 MED ORDER — ATORVASTATIN CALCIUM 40 MG PO TABS: 40.0000 mg | ORAL_TABLET | Freq: Every day | ORAL | 0 refills | Status: DC

## 2020-11-15 MED ORDER — FLUCONAZOLE 150 MG PO TABS: 150.0000 mg | ORAL_TABLET | Freq: Once | ORAL | 0 refills | Status: AC

## 2020-11-15 NOTE — Telephone Encounter (Signed)
Atorvastatin and Diflucan sent in under attending physician. The patient has been updated.

## 2020-11-15 NOTE — Telephone Encounter (Signed)
Yes both meds are appropriate.  Please send diflucan 150 mg for one dose. Matilde Haymaker, MD

## 2020-11-29 ENCOUNTER — Other Ambulatory Visit: Payer: Self-pay | Admitting: Family Medicine

## 2020-11-29 ENCOUNTER — Telehealth: Payer: Self-pay

## 2020-11-29 MED ORDER — FLUCONAZOLE 150 MG PO TABS: 150.0000 mg | ORAL_TABLET | Freq: Once | ORAL | 0 refills | Status: AC

## 2020-11-29 MED ORDER — FLUCONAZOLE 150 MG PO TABS: 150.0000 mg | ORAL_TABLET | Freq: Once | ORAL | 0 refills | Status: DC

## 2020-11-29 NOTE — Telephone Encounter (Signed)
Patient calls nurse line requesting diflucan for yeast infection. Patient reports that she has been taking jardiance since May and this is the second yeast infection she has developed. Patient reports vaginal itching and thick white discharge.   Patient requesting medication to be sent to [facility] on [address] and to discuss continuance of jardiance further with provider.   If appropriate, medication will also need to be sent under attending physician, as PCP is no longer registered with Medicaid.   Talbot Grumbling, RN

## 2020-12-26 ENCOUNTER — Other Ambulatory Visit: Payer: Self-pay | Admitting: Family Medicine

## 2020-12-26 ENCOUNTER — Telehealth: Payer: Self-pay

## 2020-12-26 MED ORDER — FLUCONAZOLE 150 MG PO TABS: 150.0000 mg | ORAL_TABLET | Freq: Once | ORAL | 0 refills | Status: AC

## 2020-12-26 NOTE — Progress Notes (Signed)
Called patient after receiving telephone message from nurse call that after starting the Jardiance she was having concern of yeast infection.  Patient states that she would like to try a Diflucan pill to see if this resolves her symptoms.  I discussed with her that I think this is reasonable and if she still has symptoms after 2 to 3 days of taking the pill she should come in for a wet prep as she could have BV or another cause of her vaginal irritation.  Patient is in agreement with this plan.  We will send in Diflucan x1.

## 2020-12-26 NOTE — Telephone Encounter (Signed)
Patient Wanda Baker on nurse line requesting #1 diflucan pill. Patient reports she was started on Jardiance by previous PCP and was told to call if she started having yeast symptoms. Patient endorses symptoms of thick discharge and itching on the outside. Patent does not check her sugars regularly. Will forward to new PCP.

## 2021-01-12 DIAGNOSIS — Z85048 Personal history of other malignant neoplasm of rectum, rectosigmoid junction, and anus: Secondary | ICD-10-CM | POA: Diagnosis not present

## 2021-01-18 ENCOUNTER — Ambulatory Visit: Payer: Medicaid Other | Admitting: Family Medicine

## 2021-01-23 NOTE — Progress Notes (Signed)
    SUBJECTIVE:   CHIEF COMPLAINT / HPI:   Diabetic Follow Up: Patient is a 60 y.o. female who present today for diabetic follow up.   Patient endorses no problems  Home medications include: Metformin 1000 mg twice daily, Jardiance 10 mg daily - stopped due to recurrent yeast infections about a week ago. Patient endorses taking these medications as prescribed.  Most recent A1Cs:  Lab Results  Component Value Date   HGBA1C 7.0 01/24/2021   HGBA1C 8.2 (A) 10/25/2020   HGBA1C 7.1 (A) 04/17/2020   Last Microalbumin, LDL, Creatinine: Lab Results  Component Value Date   LDLCALC 11 01/21/2020   CREATININE 0.63 11/06/2020   Patient does not check blood glucose on a regular basis.  Patient is not up to date on diabetic eye. Patient is not up to date on diabetic foot exam.  Questionable history atrial fibrillation: Documented in her chart but no EKG in her chart showing atrial fibrillation.  Not on anticoagulation. No concerns or complaints. She states it occurred after a surgery and has never had an issue with it since.  She states she has followed up with cardiology and has been worked up for this and no longer has to follow with them and was never indicated to start a blood thinner or other treatments.  PERTINENT  PMH / PSH: Type 2 diabetes  OBJECTIVE:   BP 132/65   Pulse 94   Ht '5\' 7"'$  (1.702 m)   Wt 156 lb 4 oz (70.9 kg)   SpO2 95%   BMI 24.47 kg/m    Diabetic foot exam was performed.  No deformities or other abnormal visual findings.  Posterior tibialis and dorsalis pulse intact bilaterally.  Intact to touch and monofilament testing bilaterally.    General: NAD, pleasant, able to participate in exam Cardiac: RRR, no murmurs. Respiratory: CTAB, normal effort, No wheezes, rales or rhonchi Abdomen: Bowel sounds present, nontender, nondistended, no hepatosplenomegaly. Extremities: no edema or cyanosis. Skin: warm and dry, no rashes noted Neuro: alert, no obvious focal  deficits Psych: Normal affect and mood  ASSESSMENT/PLAN:   Diabetes type 2, controlled (HCC) A1c today of 7.0.  Continues on metformin 1000 mg twice daily. She has since stopped jardiance due to yeast infections so it may go up at next check. Recheck in 3 months and consider additional medications if needed. Foot exam performed. Recommend diabetic eye exam.   We will check lipid panel today.   Lurline Del, [place]

## 2021-01-24 ENCOUNTER — Ambulatory Visit (INDEPENDENT_AMBULATORY_CARE_PROVIDER_SITE_OTHER): Payer: Medicaid Other | Admitting: Family Medicine

## 2021-01-24 ENCOUNTER — Other Ambulatory Visit: Payer: Self-pay

## 2021-01-24 ENCOUNTER — Encounter: Payer: Self-pay | Admitting: Family Medicine

## 2021-01-24 VITALS — BP 132/65 | HR 94 | Ht 67.0 in | Wt 156.2 lb

## 2021-01-24 DIAGNOSIS — E119 Type 2 diabetes mellitus without complications: Secondary | ICD-10-CM

## 2021-01-24 LAB — POCT GLYCOSYLATED HEMOGLOBIN (HGB A1C): HbA1c, POC (controlled diabetic range): 7 % (ref 0.0–7.0)

## 2021-01-24 NOTE — Assessment & Plan Note (Signed)
A1c today of 7.0.  Continues on metformin 1000 mg twice daily. She has since stopped jardiance due to yeast infections so it may go up at next check. Recheck in 3 months and consider additional medications if needed. Foot exam performed. Recommend diabetic eye exam.

## 2021-01-24 NOTE — Patient Instructions (Signed)
Our plan for today: -Your A1c is 7.0, this is at goal!  Congratulations on your hard work! -I recommend that you get your diabetic eye exam each year -We performed your foot exam today -We are going to check a lipid panel today and I will call you with the results  I would like to see you back in 3 months to check your A1c.  Continue to work on diet and exercise.  It may go up since you have stopped the Jardiance.

## 2021-01-25 ENCOUNTER — Telehealth: Payer: Self-pay | Admitting: Family Medicine

## 2021-01-25 LAB — LIPID PANEL
Chol/HDL Ratio: 2.6 ratio (ref 0.0–4.4)
Cholesterol, Total: 79 mg/dL — ABNORMAL LOW (ref 100–199)
HDL: 30 mg/dL — ABNORMAL LOW (ref >39)
LDL Chol Calc (NIH): 21 mg/dL (ref 0–99)
Triglycerides: 174 mg/dL — ABNORMAL HIGH (ref 0–149)
VLDL Cholesterol Cal: 28 mg/dL (ref 5–40)

## 2021-01-25 NOTE — Telephone Encounter (Signed)
Called patient to discuss her lipid panel.  See result note

## 2021-02-16 ENCOUNTER — Other Ambulatory Visit: Payer: Self-pay

## 2021-02-16 MED ORDER — ATORVASTATIN CALCIUM 40 MG PO TABS: 40.0000 mg | ORAL_TABLET | Freq: Every day | ORAL | 2 refills | Status: DC

## 2021-02-20 ENCOUNTER — Other Ambulatory Visit: Payer: Self-pay

## 2021-02-20 DIAGNOSIS — K219 Gastro-esophageal reflux disease without esophagitis: Secondary | ICD-10-CM

## 2021-02-20 MED ORDER — FAMOTIDINE 20 MG PO TABS: ORAL_TABLET | ORAL | 3 refills | Status: DC

## 2021-02-22 ENCOUNTER — Other Ambulatory Visit: Payer: Self-pay

## 2021-02-22 MED ORDER — GABAPENTIN 400 MG PO CAPS: ORAL_CAPSULE | ORAL | 1 refills | Status: DC

## 2021-03-13 ENCOUNTER — Ambulatory Visit: Payer: Medicaid Other

## 2021-03-19 ENCOUNTER — Ambulatory Visit: Payer: Medicaid Other

## 2021-04-13 ENCOUNTER — Ambulatory Visit: Payer: Medicaid Other | Admitting: Oncology

## 2021-04-13 ENCOUNTER — Inpatient Hospital Stay: Payer: Medicaid Other | Attending: Oncology | Admitting: Oncology

## 2021-04-13 ENCOUNTER — Other Ambulatory Visit: Payer: Self-pay

## 2021-04-13 ENCOUNTER — Inpatient Hospital Stay: Payer: Medicaid Other

## 2021-04-13 ENCOUNTER — Other Ambulatory Visit: Payer: Medicaid Other

## 2021-04-13 VITALS — BP 126/73 | HR 83 | Temp 98.7°F | Resp 18 | Ht 67.0 in | Wt 155.0 lb

## 2021-04-13 DIAGNOSIS — C20 Malignant neoplasm of rectum: Secondary | ICD-10-CM | POA: Diagnosis not present

## 2021-04-13 DIAGNOSIS — Z85048 Personal history of other malignant neoplasm of rectum, rectosigmoid junction, and anus: Secondary | ICD-10-CM | POA: Diagnosis present

## 2021-04-13 LAB — CEA (ACCESS): CEA (CHCC): 1.44 ng/mL (ref 0.00–5.00)

## 2021-04-13 NOTE — Progress Notes (Signed)
  [facility] OFFICE PROGRESS NOTE   Diagnosis: Rectal cancer  INTERVAL HISTORY:   Ms. Gear returns as scheduled.  She feels well.  Good appetite.  No complaint.  She saw Dr. Dema Severin in July.  Objective:  Vital signs in last 24 hours:  Blood pressure 126/73, pulse 83, temperature 98.7 F (37.1 C), temperature source Oral, resp. rate 18, height $RemoveBe'5\' 7"'zsqqCPyyW$  (1.702 m), weight 155 lb (70.3 kg), SpO2 100 %.    Lymphatics: No cervical, supraclavicular, axillary, or inguinal nodes Resp: Lungs clear bilaterally Cardio: Regular rate and rhythm GI: No mass, nontender, no hepatosplenomegaly, mild ventral hernia Vascular: No leg edema   Lab Results:  Lab Results  Component Value Date   WBC 9.4 03/29/2018   HGB 12.5 03/29/2018   HCT 38.3 03/29/2018   MCV 88.9 03/29/2018   PLT 221 03/29/2018   NEUTROABS 6.8 03/29/2018    CMP  Lab Results  Component Value Date   NA 141 11/06/2020   K 4.3 11/06/2020   CL 102 11/06/2020   CO2 24 11/06/2020   GLUCOSE 188 (H) 11/06/2020   BUN 8 11/06/2020   CREATININE 0.63 11/06/2020   CALCIUM 9.9 11/06/2020   PROT 7.3 03/17/2018   ALBUMIN 4.1 03/17/2018   AST 32 03/17/2018   ALT 27 03/17/2018   ALKPHOS 73 03/17/2018   BILITOT 0.9 03/17/2018   GFRNONAA 102 01/31/2020   GFRAA 118 01/31/2020    Lab Results  Component Value Date   CEA1 1.52 08/14/2020   CEA 8.5 (H) 09/17/2016     Medications: I have reviewed the patient's current medications.   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), status post a low anterior resection and diverting colostomy 09/18/2016-mass noted just below the peritoneal reflection Tumor noted at 15 cm on sigmoidoscopy 09/17/2016 CT abdomen/pelvis 09/16/2016-colon obstruction, mass at the "distal sigmoid colon " Elevated preoperative CEA Low anterior resection 09/18/2016, pT3,pN0, 0/20 lymph nodes positive, extracellular mucin without definitive tumor on the inked serosal  surface MSI-stable, no loss of mismatch repair protein expression Initiation of Xeloda/radiation 11/21/2016; completion of Xeloda/radiation 12/31/2016 Cycle 1 adjuvant Xeloda 01/27/2017 Cycle 2 adjuvant Xeloda 02/17/2017 Cycle 3 adjuvant Xeloda 03/10/2017 Cycle 4 adjuvant Xeloda 03/31/2017 (Xeloda dose escalated by one tablet daily)  Cycle 5 adjuvant Xeloda 04/21/2017 Colonoscopy 04/18/2017-tubular adenoma removed from the ascending colon Colostomy takedown and creation of an ileostomy 08/08/2017 Takedown of diverting ileostomy 03/27/2018 Negative colonoscopy 06/04/2018 CTs 06/15/2020-no evidence of recurrent disease   2.   History of a colonic obstruction secondary to #1   3.   Iron deficiency anemia secondary to #1.  Resolved       Disposition: [name] is in clinical remission from rectal cancer.  We will follow-up on the CEA from today.  She will return for an office visit and CEA in 8 months.  She is scheduled to see Dr. Dema Severin in January.  We discussed the indication for surveillance CT images.  She is greater than 4 years out from the cancer diagnosis.  I do not recommend additional surveillance imaging.  She will discuss this further with Dr. Dema Severin when she sees him in January.  She will continue colonoscopy surveillance with Dr. Hulen Luster, MD  04/13/2021  9:49 AM

## 2021-05-31 ENCOUNTER — Ambulatory Visit (INDEPENDENT_AMBULATORY_CARE_PROVIDER_SITE_OTHER): Payer: Medicaid Other | Admitting: Family Medicine

## 2021-05-31 ENCOUNTER — Other Ambulatory Visit: Payer: Self-pay

## 2021-05-31 ENCOUNTER — Encounter: Payer: Self-pay | Admitting: Family Medicine

## 2021-05-31 ENCOUNTER — Ambulatory Visit (INDEPENDENT_AMBULATORY_CARE_PROVIDER_SITE_OTHER): Payer: Medicaid Other

## 2021-05-31 VITALS — BP 146/68 | HR 85 | Ht 67.0 in | Wt 151.4 lb

## 2021-05-31 DIAGNOSIS — E119 Type 2 diabetes mellitus without complications: Secondary | ICD-10-CM

## 2021-05-31 DIAGNOSIS — C20 Malignant neoplasm of rectum: Secondary | ICD-10-CM | POA: Diagnosis not present

## 2021-05-31 DIAGNOSIS — Z23 Encounter for immunization: Secondary | ICD-10-CM | POA: Diagnosis present

## 2021-05-31 DIAGNOSIS — N3090 Cystitis, unspecified without hematuria: Secondary | ICD-10-CM

## 2021-05-31 LAB — POCT URINALYSIS DIP (MANUAL ENTRY)
Bilirubin, UA: NEGATIVE
Glucose, UA: 250 mg/dL — AB
Ketones, POC UA: NEGATIVE mg/dL
Nitrite, UA: POSITIVE — AB
Spec Grav, UA: >=1.03 — AB (ref 1.010–1.025)
Urobilinogen, UA: 0.2 E.U./dL
pH, UA: 5 (ref 5.0–8.0)

## 2021-05-31 LAB — POCT GLYCOSYLATED HEMOGLOBIN (HGB A1C): HbA1c, POC (controlled diabetic range): 6.2 % (ref 0.0–7.0)

## 2021-05-31 MED ORDER — NITROFURANTOIN MONOHYD MACRO 100 MG PO CAPS: 100.0000 mg | ORAL_CAPSULE | Freq: Two times a day (BID) | ORAL | 0 refills | Status: AC

## 2021-05-31 NOTE — Patient Instructions (Signed)
Your A1c is excellent today.  It is 6.2.  I would like for you to continue your metformin 1000 mg twice daily.  I like to see back in about 6 months for an A1c recheck.  If you need anything in the meantime do not hesitate to let me know.  For your urinary tract infection we are sending in an antibiotic for the next 5 days.  Please take the entire course of the antibiotic and if it does not fully resolve your symptoms please let me know.  I have called in a urine culture and will let you know if it grows anything that is resistant to the antibiotic.  Blood pressure is a little bit elevated today at 138/64.  I would like for you to check it at home over the next couple of days while you are relaxed and call and let me know what the numbers are.  If they remain high we may need to make some changes for blood pressure medication but if not we can follow-up as above.

## 2021-05-31 NOTE — Assessment & Plan Note (Signed)
A1c today of 6.2.  Continues on metformin 1000 mg twice daily.  Recommended yearly diabetic eye exam.  We will consider decreasing metformin at next visit if A1c remains controlled

## 2021-05-31 NOTE — Progress Notes (Signed)
° ° °  SUBJECTIVE:   CHIEF COMPLAINT / HPI:   Diabetic Follow Up: Patient is a 60 y.o. female who present today for diabetic follow up.   Patient endorses no problems  Home medications include: Metformin 1000 twice daily Patient endorses taking these medications as prescribed.  Most recent A1Cs:  Lab Results  Component Value Date   HGBA1C 6.2 05/31/2021   HGBA1C 7.0 01/24/2021   HGBA1C 8.2 (A) 10/25/2020   Last Microalbumin, LDL, Creatinine: Lab Results  Component Value Date   LDLCALC 21 01/24/2021   CREATININE 0.63 11/06/2020   Patient does not check blood glucose on a regular basis.  Patient is not up to date on diabetic eye. Patient is up to date on diabetic foot exam.  History of rectal cancer: Seems to be doing well from the standpoint.  Most recently saw her surgeon in July for follow-up.  She had an obstructing rectal cancer with surgery and adjunct chemo in 2018.  She is due for CT chest abdomen and pelvis later this month and a repeat scope later this month per previous documentation.  She had follow-up with oncology in October where she said had a CEA drawn which was within normal limits.  Dysuria: Burning with urination since this morning. Increased frequency. No abdominal discomfort.    PERTINENT  PMH / PSH: T2DM  OBJECTIVE:   BP (!) 146/68    Pulse 85    Ht 5\' 7"  (1.702 m)    Wt 151 lb 6.4 oz (68.7 kg)    SpO2 100%    BMI 23.71 kg/m    BP recheck: 138/64  General: NAD, pleasant, able to participate in exam Cardiac: RRR, no murmurs. Respiratory: CTAB, normal effort, No wheezes, rales or rhonchi Abdomen: Bowel sounds present, nontender, no suprapubic discomfort Skin: warm and dry, no rashes noted Neuro: alert, no obvious focal deficits Psych: Normal affect and mood  ASSESSMENT/PLAN:   Diabetes type 2, controlled (HCC) A1c today of 6.2.  Continues on metformin 1000 mg twice daily.  Recommended yearly diabetic eye exam.  We will consider decreasing  metformin at next visit if A1c remains controlled  Rectal cancer ([place]) Continues to follow with oncology.  Had recent appointment with them in October and a CEA which was within normal limits.  Has no complaints or concerns today.  UTI: Uncomplicated. Hx of pcn allergy.  UA with positive nitrite, positive leuk esterase, cloudy.  No suprapubic discomfort.  Will do macrobid.  Will order urine culture and will change based off sensitivities if needed.  Hypertension: Blood pressure of 146/68, 138/64 on manual recheck.  She plans to check her blood pressures at home over the next few days and will call another number she is saying.  Will not make changes today based off of single blood pressure reading and will await seeing further blood pressure measurements.    Lurline Del, [place]

## 2021-05-31 NOTE — Assessment & Plan Note (Signed)
Continues to follow with oncology.  Had recent appointment with them in October and a CEA which was within normal limits.  Has no complaints or concerns today.

## 2021-06-21 ENCOUNTER — Other Ambulatory Visit: Payer: Self-pay | Admitting: Family Medicine

## 2021-06-21 ENCOUNTER — Telehealth: Payer: Self-pay

## 2021-06-21 DIAGNOSIS — N3090 Cystitis, unspecified without hematuria: Secondary | ICD-10-CM

## 2021-06-21 NOTE — Telephone Encounter (Signed)
Patient calls nurse line reporting a recurrent UTI. Patient reports dysuria, urinary frequency and abnormal odor. Patient denies fever or back/flank pain. Patient reports she took all of the antibiotic given to her on 12/15 and felt better. However, symptoms returned within the last few days. Patient reports she can not come in at this time due to her mother passing away over the weekend. Patient reports the funeral is today and she has "a lot" going on.   Patient is requesting another round of Macrobid. Patient encouraged to push fluids. Red flags discussed with patient.   Will forward to PCP for advisement.

## 2021-06-21 NOTE — Progress Notes (Signed)
Called patient in regards to her phone call in today.  I had previously seen her for urinary tract infection and prescribed antibiotics.  She states that she got little bit better while completing the course of antibiotics which she took entirely.  She states that her symptoms have since worsened.  She also states she is been under a great deal of stress from the sudden loss of her mother.  I explained to her that we need a urine culture to decide if we need a different antibiotic choice versus the possibility this could be interstitial cystitis.  I discussed the importance of getting a lab to determine our next steps.  She was in agreement with this.    I have ordered a future urinalysis and a future urine culture which she plans to come by tomorrow morning and do a lab only visit.  I will follow-up with her once we get the results and discuss the next steps.

## 2021-06-22 ENCOUNTER — Other Ambulatory Visit: Payer: Medicaid Other

## 2021-06-22 ENCOUNTER — Other Ambulatory Visit: Payer: Self-pay

## 2021-06-22 DIAGNOSIS — N3090 Cystitis, unspecified without hematuria: Secondary | ICD-10-CM | POA: Diagnosis not present

## 2021-06-23 LAB — MICROSCOPIC EXAMINATION
Bacteria, UA: NONE SEEN
Casts: NONE SEEN /lpf
WBC, UA: >30 /hpf — AB (ref 0–5)

## 2021-06-23 LAB — URINALYSIS, ROUTINE W REFLEX MICROSCOPIC
Bilirubin, UA: NEGATIVE
Glucose, UA: NEGATIVE
Ketones, UA: NEGATIVE
Nitrite, UA: NEGATIVE
Specific Gravity, UA: 1.015 (ref 1.005–1.030)
Urobilinogen, Ur: 0.2 mg/dL (ref 0.2–1.0)
pH, UA: 6 (ref 5.0–7.5)

## 2021-06-27 ENCOUNTER — Other Ambulatory Visit: Payer: Self-pay | Admitting: Family Medicine

## 2021-06-27 LAB — URINE CULTURE

## 2021-06-27 MED ORDER — CIPROFLOXACIN HCL 500 MG PO TABS: 500.0000 mg | ORAL_TABLET | Freq: Two times a day (BID) | ORAL | 0 refills | Status: AC

## 2021-07-04 ENCOUNTER — Other Ambulatory Visit: Payer: Self-pay

## 2021-07-04 MED ORDER — GABAPENTIN 400 MG PO CAPS: ORAL_CAPSULE | ORAL | 1 refills | Status: DC

## 2021-08-14 ENCOUNTER — Encounter: Payer: Self-pay | Admitting: Gastroenterology

## 2021-09-10 ENCOUNTER — Other Ambulatory Visit: Payer: Self-pay | Admitting: Family Medicine

## 2021-09-10 ENCOUNTER — Other Ambulatory Visit: Payer: Self-pay | Admitting: Psychiatry

## 2021-09-10 DIAGNOSIS — K219 Gastro-esophageal reflux disease without esophagitis: Secondary | ICD-10-CM

## 2021-09-10 DIAGNOSIS — Z1231 Encounter for screening mammogram for malignant neoplasm of breast: Secondary | ICD-10-CM

## 2021-09-25 ENCOUNTER — Ambulatory Visit: Payer: Medicaid Other

## 2021-09-28 ENCOUNTER — Encounter: Payer: Self-pay | Admitting: Gastroenterology

## 2021-10-05 ENCOUNTER — Telehealth: Payer: Self-pay | Admitting: *Deleted

## 2021-10-05 NOTE — Telephone Encounter (Signed)
Noted! Thank you

## 2021-10-05 NOTE — Telephone Encounter (Signed)
Robbin,  This pt is cleared for anesthetic care at [facility].   Thanks,   Clancy Mullarkey 

## 2021-10-05 NOTE — Telephone Encounter (Signed)
John, ? ?Please review. Okay for [place]? Thanks, Crickett Abbett pv ?

## 2021-10-08 ENCOUNTER — Other Ambulatory Visit: Payer: Self-pay

## 2021-10-08 DIAGNOSIS — I10 Essential (primary) hypertension: Secondary | ICD-10-CM

## 2021-10-08 MED ORDER — METOPROLOL TARTRATE 50 MG PO TABS: ORAL_TABLET | ORAL | 0 refills | Status: DC

## 2021-10-11 ENCOUNTER — Ambulatory Visit
Admission: RE | Admit: 2021-10-11 | Discharge: 2021-10-11 | Disposition: A | Discharge status: Home / Self Care | Payer: Medicaid Other | Source: Ambulatory Visit | Attending: Psychiatry | Admitting: Psychiatry

## 2021-10-11 DIAGNOSIS — Z1231 Encounter for screening mammogram for malignant neoplasm of breast: Secondary | ICD-10-CM

## 2021-10-22 DIAGNOSIS — Z85048 Personal history of other malignant neoplasm of rectum, rectosigmoid junction, and anus: Secondary | ICD-10-CM | POA: Diagnosis not present

## 2021-10-23 ENCOUNTER — Ambulatory Visit (AMBULATORY_SURGERY_CENTER): Payer: Medicaid Other | Admitting: *Deleted

## 2021-10-23 VITALS — Ht 67.0 in | Wt 159.0 lb

## 2021-10-23 DIAGNOSIS — Z85038 Personal history of other malignant neoplasm of large intestine: Secondary | ICD-10-CM

## 2021-10-23 MED ORDER — ONDANSETRON HCL 4 MG PO TABS: 4.0000 mg | ORAL_TABLET | ORAL | 0 refills | Status: DC

## 2021-10-23 MED ORDER — PLENVU 140 G PO SOLR: 1.0000 | Freq: Once | ORAL | 0 refills | Status: AC

## 2021-10-23 NOTE — Progress Notes (Signed)
Patient's pre-visit was done today over the phone with the patient. Name,DOB and address verified. Patient denies any allergies to Eggs and Soy. Patient denies any problems with anesthesia/sedation. Patient is not taking any diet pills or blood thinners. No home Oxygen. Insurance confirmed with patient. ? ?Prep instructions mailed to pt-pt is aware. Patient understands to call us back with any questions or concerns. Patient is aware of our care-partner policy. Pt denies constipation. Zofran given with prep to prevent nausea-as pt had this last prep. ? ?The patient is COVID-19 vaccinated.   ?

## 2021-11-08 ENCOUNTER — Other Ambulatory Visit: Payer: Self-pay | Admitting: Family Medicine

## 2021-11-09 ENCOUNTER — Other Ambulatory Visit: Payer: Self-pay

## 2021-11-09 DIAGNOSIS — E119 Type 2 diabetes mellitus without complications: Secondary | ICD-10-CM

## 2021-11-09 MED ORDER — LOSARTAN POTASSIUM 25 MG PO TABS: 25.0000 mg | ORAL_TABLET | Freq: Every day | ORAL | 0 refills | Status: DC

## 2021-11-22 ENCOUNTER — Other Ambulatory Visit: Payer: Self-pay | Admitting: Family Medicine

## 2021-11-22 DIAGNOSIS — E119 Type 2 diabetes mellitus without complications: Secondary | ICD-10-CM

## 2021-11-27 ENCOUNTER — Ambulatory Visit (AMBULATORY_SURGERY_CENTER): Payer: Medicare Other | Admitting: Gastroenterology

## 2021-11-27 ENCOUNTER — Encounter: Payer: Self-pay | Admitting: Gastroenterology

## 2021-11-27 VITALS — BP 131/68 | HR 70 | Temp 97.5°F | Resp 16 | Ht 67.0 in | Wt 159.0 lb

## 2021-11-27 DIAGNOSIS — Z86010 Personal history of colonic polyps: Secondary | ICD-10-CM

## 2021-11-27 DIAGNOSIS — Z08 Encounter for follow-up examination after completed treatment for malignant neoplasm: Secondary | ICD-10-CM

## 2021-11-27 DIAGNOSIS — D120 Benign neoplasm of cecum: Secondary | ICD-10-CM

## 2021-11-27 DIAGNOSIS — Z09 Encounter for follow-up examination after completed treatment for conditions other than malignant neoplasm: Secondary | ICD-10-CM

## 2021-11-27 DIAGNOSIS — Z85038 Personal history of other malignant neoplasm of large intestine: Secondary | ICD-10-CM | POA: Diagnosis not present

## 2021-11-27 MED ORDER — SODIUM CHLORIDE 0.9 % IV SOLN: 500.0000 mL | Freq: Once | INTRAVENOUS | Status: DC

## 2021-11-27 NOTE — Progress Notes (Signed)
Called to room to assist during endoscopic procedure.  Patient ID and intended procedure confirmed with present staff. Received instructions for my participation in the procedure from the performing physician.  

## 2021-11-27 NOTE — Op Note (Signed)
[facility] Patient Name: Temima Kutsch Procedure Date: 11/27/2021 8:28 AM MRN: [id number] Endoscopist: Mauri Pole , MD Age: 61 Referring MD:  Date of Birth: 27-Mar-1961 Gender: Female Account #: [id number] Procedure:                Colonoscopy Indications:              High risk colon cancer surveillance: Personal                            history of colonic polyps, High risk colon cancer                            surveillance: Personal history of colon cancer Medicines:                Monitored Anesthesia Care Procedure:                Pre-Anesthesia Assessment:                           - Prior to the procedure, a History and Physical                            was performed, and patient medications and                            allergies were reviewed. The patient's tolerance of                            previous anesthesia was also reviewed. The risks                            and benefits of the procedure and the sedation                            options and risks were discussed with the patient.                            All questions were answered, and informed consent                            was obtained. Prior Anticoagulants: The patient has                            taken no previous anticoagulant or antiplatelet                            agents. ASA Grade Assessment: II - A patient with                            mild systemic disease. After reviewing the risks                            and benefits, the patient was deemed in  satisfactory condition to undergo the procedure.                           After obtaining informed consent, the colonoscope                            was passed under direct vision. Throughout the                            procedure, the patient's blood pressure, pulse, and                            oxygen saturations were monitored continuously. The                            PCF-HQ190L  Colonoscope was introduced through the                            anus and advanced to the the cecum, identified by                            appendiceal orifice and ileocecal valve. The                            colonoscopy was performed without difficulty. The                            patient tolerated the procedure well. The quality                            of the bowel preparation was good. The ileocecal                            valve, appendiceal orifice, and rectum were                            photographed. Scope In: 8:42:43 AM Scope Out: 8:52:58 AM Scope Withdrawal Time: 0 hours 6 minutes 23 seconds  Total Procedure Duration: 0 hours 10 minutes 15 seconds  Findings:                 The perianal and digital rectal examinations were                            normal.                           A 14 mm polyp was found in the cecum. The polyp was                            sessile. The polyp was removed with a cold snare.                            Resection and retrieval were complete.  There was evidence of a prior end-to-end                            colo-colonic anastomosis in the recto-sigmoid                            colon. This was patent and was characterized by                            erythema.                           Non-bleeding external and internal hemorrhoids were                            found during retroflexion. The hemorrhoids were                            medium-sized. Complications:            No immediate complications. Estimated Blood Loss:     Estimated blood loss was minimal. Impression:               - One 14 mm polyp in the cecum, removed with a cold                            snare. Resected and retrieved.                           - Patent end-to-end colo-colonic anastomosis,                            characterized by erythema.                           - Non-bleeding external and internal  hemorrhoids. Recommendation:           - Patient has a contact number available for                            emergencies. The signs and symptoms of potential                            delayed complications were discussed with the                            patient. Return to normal activities tomorrow.                            Written discharge instructions were provided to the                            patient.                           - Resume previous diet.                           -  Continue present medications.                           - Await pathology results.                           - Repeat colonoscopy in 3 years for surveillance                            based on pathology results. Mauri Pole, MD 11/27/2021 9:00:52 AM This report has been signed electronically.

## 2021-11-27 NOTE — Progress Notes (Signed)
Pt's states no medical or surgical changes since previsit or office visit. 

## 2021-11-27 NOTE — Progress Notes (Signed)
To pacu, VSS. Report to [email or web address] 

## 2021-11-27 NOTE — Progress Notes (Signed)
[facility] History and Physical   Primary Care Physician:  Lurline Del, DO   Reason for Procedure:  History of adenomatous colon polyps and colon cancer  Plan:    Surveillance colonoscopy with possible interventions as needed     HPI: Wanda Baker is a very pleasant 61 y.o. female here for surveillance colonoscopy. Denies any nausea, vomiting, abdominal pain, melena or bright red blood per rectum  The risks and benefits as well as alternatives of endoscopic procedure(s) have been discussed and reviewed. All questions answered. The patient agrees to proceed.    Past Medical History:  Diagnosis Date   Adenocarcinoma of colon ([place]) 09/23/2016   Allergy    uses Nasocort daily and takes Singulair nightly    Anemia    Arthritis    Chronic back pain    buldging disc   Diabetes mellitus without complication (HCC)    diet controlled   DJD (degenerative joint disease)    whole spine   GERD (gastroesophageal reflux disease)    takes Omeprazole daily    History of bronchitis    many yrs ago   Hyperlipidemia    takes Atorvastatin daily   Hypertension    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 09/18/2016   Weakness    left arm resolved now    Past Surgical History:  Procedure Laterality Date   ABDOMINAL HYSTERECTOMY  2011   Supracervical Hysterectomy complete   ANTERIOR CERVICAL DECOMP/DISCECTOMY FUSION N/A 12/02/2013   Procedure: ANTERIOR CERVICAL DECOMPRESSION/DISCECTOMY FUSION 3 LEVELS;  Surgeon: Erline Levine, MD;  Location: [facility];  Service: Neurosurgery;  Laterality: N/A;  C4-5 C5-6 C6-7 Anterior cervical decompression/diskectomy/fusion   BOWEL RESECTION N/A 09/18/2016   Procedure: LOW ANTERIOR COLON RESECTION WITH COLOSTOMY;  Surgeon: Fanny Skates, MD;  Location: [facility];  Service: General;  Laterality: N/A;   BOWEL RESECTION  08/08/2017   Procedure: SMALL BOWEL RESECTION x 2;  Surgeon: Ileana Roup, MD;   Location: [facility];  Service: General;;   CESAREAN SECTION  86/88   COLONOSCOPY  06/04/2018   Dr.Chamara Dyck   COLOSTOMY TAKEDOWN N/A 08/08/2017   Procedure: Venida Jarvis TAKEDOWN OF COLOSTOMY WITH COLORECTAL ANASTOMOIS ERAS PATHWAY;  Surgeon: Ileana Roup, MD;  Location: [facility];  Service: General;  Laterality: N/A;   CYSTOSCOPY W/ URETERAL STENT PLACEMENT N/A 08/08/2017   Procedure: CYSTOSCOPY WITH URETERAL CATHETERS;  Surgeon: Ardis Hughs, MD;  Location: [facility];  Service: Urology;  Laterality: N/A;   FLEXIBLE SIGMOIDOSCOPY N/A 09/17/2016   Procedure: FLEXIBLE SIGMOIDOSCOPY;  Surgeon: Mauri Pole, MD;  Location: [facility];  Service: Endoscopy;  Laterality: N/A;   FLEXIBLE SIGMOIDOSCOPY N/A 08/08/2017   Procedure: FLEXIBLE SIGMOIDOSCOPY;  Surgeon: Ileana Roup, MD;  Location: [facility];  Service: General;  Laterality: N/A;   FLEXIBLE SIGMOIDOSCOPY N/A 11/24/2017   Procedure: FLEXIBLE SIGMOIDOSCOPY;  Surgeon: Ileana Roup, MD;  Location: [facility];  Service: General;  Laterality: N/A;   ILEOSTOMY N/A 08/08/2017   Procedure: Ladon Applebaum  ILEOSTOMY;  Surgeon: Ileana Roup, MD;  Location: [facility];  Service: General;  Laterality: N/A;   LAPAROSCOPY N/A 03/27/2018   Procedure: CLOSURE OF ILEOSTOMY;  Surgeon: Ileana Roup, MD;  Location: [facility];  Service: General;  Laterality: N/A;   LAPAROTOMY N/A 08/08/2017   Procedure: EXPLORATORY LAPAROTOMY;  Surgeon: Ileana Roup, MD;  Location: [facility];  Service: General;  Laterality: N/A;   LYSIS OF ADHESION  08/08/2017   Procedure: LYSIS OF ADHESION;  Surgeon: Ileana Roup, MD;  Location: [facility];  Service: General;;   POLYPECTOMY     RECTAL EXAM UNDER ANESTHESIA  11/24/2017   Procedure: RECTAL EXAM UNDER ANESTHESIA;  Surgeon: Ileana Roup, MD;  Location: [facility];  Service: General;;   SIGMOIDOSCOPY     TUBAL LIGATION      Prior to Admission medications   Medication Sig Start Date  End Date Taking? Authorizing Provider  acetaminophen (TYLENOL) 650 MG CR tablet Take 1,300 mg by mouth every 8 (eight) hours as needed for pain.   Yes [provider]  atorvastatin (LIPITOR) 40 MG tablet Take 1 tablet (40 mg total) by mouth daily. 02/16/21  Yes Welborn, Ryan, DO  cetirizine (ZYRTEC) 10 MG tablet Take 1 tablet (10 mg total) by mouth at bedtime. 11/17/17  Yes Mikell, Jeani Sow, MD  famotidine (PEPCID) 20 MG tablet TAKE 1 TABLET(20 MG) BY MOUTH TWICE DAILY 09/10/21  Yes Welborn, Ryan, DO  gabapentin (NEURONTIN) 400 MG capsule TAKE 1 CAPSULE BY MOUTH THREE TIMES DAILY AS NEEDED 11/08/21  Yes Brimage, Vondra, DO  losartan (COZAAR) 25 MG tablet Take 1 tablet (25 mg total) by mouth at bedtime. 11/09/21  Yes Brimage, Vondra, DO  metFORMIN (GLUCOPHAGE) 1000 MG tablet TAKE 1 TABLET(1000 MG) BY MOUTH TWICE DAILY WITH A MEAL AS DIRECTED 11/22/21  Yes Welborn, Ryan, DO  metoprolol tartrate (LOPRESSOR) 50 MG tablet TAKE 2 TABLETS(100 MG) BY MOUTH DAILY 10/08/21  Yes Brimage, Vondra, DO  Multiple Vitamins-Minerals (ALIVE ONCE DAILY WOMENS 50+ PO) Take 1 tablet by mouth daily.    Yes [provider]  ondansetron (ZOFRAN) 4 MG tablet Take 1 tablet (4 mg total) by mouth as directed. Take one Zofran pill 30-60 minutes before each colonoscopy prep dose 10/23/21  Yes Eryka Dolinger V, MD  psyllium (METAMUCIL) 58.6 % packet Take 1 packet by mouth daily.   Yes [provider]  saccharomyces boulardii (FLORASTOR) 250 MG capsule Take 1 capsule (250 mg total) by mouth 2 (two) times daily. 08/14/17  Yes Lavina Hamman, MD  diclofenac Sodium (VOLTAREN) 1 % GEL Apply 2 g topically 4 (four) times daily. Patient not taking: Reported on 11/27/2021 09/07/20   Matilde Haymaker, MD    Current Outpatient Medications  Medication Sig Dispense Refill   acetaminophen (TYLENOL) 650 MG CR tablet Take 1,300 mg by mouth every 8 (eight) hours as needed for pain.     atorvastatin (LIPITOR) 40 MG tablet Take 1  tablet (40 mg total) by mouth daily. 90 tablet 2   cetirizine (ZYRTEC) 10 MG tablet Take 1 tablet (10 mg total) by mouth at bedtime. 30 tablet 4   famotidine (PEPCID) 20 MG tablet TAKE 1 TABLET(20 MG) BY MOUTH TWICE DAILY 90 tablet 3   gabapentin (NEURONTIN) 400 MG capsule TAKE 1 CAPSULE BY MOUTH THREE TIMES DAILY AS NEEDED 180 capsule 0   losartan (COZAAR) 25 MG tablet Take 1 tablet (25 mg total) by mouth at bedtime. 90 tablet 0   metFORMIN (GLUCOPHAGE) 1000 MG tablet TAKE 1 TABLET(1000 MG) BY MOUTH TWICE DAILY WITH A MEAL AS DIRECTED 120 tablet 0   metoprolol tartrate (LOPRESSOR) 50 MG tablet TAKE 2 TABLETS(100 MG) BY MOUTH DAILY 180 tablet 0   Multiple Vitamins-Minerals (ALIVE ONCE DAILY WOMENS 50+ PO) Take 1 tablet by mouth daily.      ondansetron (ZOFRAN) 4 MG tablet Take 1 tablet (4 mg total) by mouth as directed. Take one Zofran pill 30-60 minutes before each colonoscopy prep  dose 2 tablet 0   psyllium (METAMUCIL) 58.6 % packet Take 1 packet by mouth daily.     saccharomyces boulardii (FLORASTOR) 250 MG capsule Take 1 capsule (250 mg total) by mouth 2 (two) times daily. 60 capsule 0   diclofenac Sodium (VOLTAREN) 1 % GEL Apply 2 g topically 4 (four) times daily. (Patient not taking: Reported on 11/27/2021) 50 g 1   No current facility-administered medications for this visit.    Allergies as of 11/27/2021 - Review Complete 11/27/2021  Allergen Reaction Noted   Hydrocodone Other (See Comments) 08/12/2017   Nsaids Other (See Comments) 08/13/2017   Penicillins Swelling, Rash, and Other (See Comments) 01/05/2009   Prednisone Rash     Family History  Problem Relation Age of Onset   Heart disease Maternal Grandmother    Diabetes Maternal Grandmother    Hypertension Maternal Grandmother    Heart disease Maternal Grandfather    Heart disease Paternal Grandmother    Diabetes Paternal Grandmother    Alzheimer's disease Paternal Grandmother    Esophageal cancer Paternal Uncle    Colon  cancer Neg Hx    Colon polyps Neg Hx    Rectal cancer Neg Hx    Stomach cancer Neg Hx     Social History   Socioeconomic History   Marital status: Divorced    Spouse name: Not on file   Number of children: Not on file   Years of education: Not on file   Highest education level: Not on file  Occupational History   Not on file  Tobacco Use   Smoking status: Former    Packs/day: 0.50    Years: 15.00    Total pack years: 7.50    Types: Cigarettes   Smokeless tobacco: Never   Tobacco comments:    quit smoking 2011  Vaping Use   Vaping Use: Never used  Substance and Sexual Activity   Alcohol use: No   Drug use: No   Sexual activity: Never    Birth control/protection: Surgical  Other Topics Concern   Not on file  Social History Narrative   lives with adult sons and mother and father   divorced   24 YO son has autism   Social Determinants of Radio broadcast assistant Strain: Not on file  Food Insecurity: Not on file  Transportation Needs: Not on file  Physical Activity: Not on file  Stress: Not on file  Social Connections: Not on file  Intimate Partner Violence: Not on file    Review of Systems:  All other review of systems negative except as mentioned in the HPI.  Physical Exam: Vital signs in last 24 hours: BP (!) 103/59   Pulse 73   Temp (!) 97.5 F (36.4 C) (Temporal)   Ht '5\' 7"'$  (1.702 m)   Wt 159 lb (72.1 kg)   SpO2 97%   BMI 24.90 kg/m  General:   Alert, NAD Lungs:  Clear .   Heart:  Regular rate and rhythm Abdomen:  Soft, nontender and nondistended. Neuro/Psych:  Alert and cooperative. Normal mood and affect. A and O x 3  Reviewed labs, radiology imaging, old records and pertinent past GI work up  Patient is appropriate for planned procedure(s) and anesthesia in an ambulatory setting   K. Denzil Magnuson , MD [phone]

## 2021-11-27 NOTE — Patient Instructions (Signed)
Handout given for polyps and hemorrhoids. Resume previous diet.  Continue present medications. Await pathology results.  YOU HAD AN ENDOSCOPIC PROCEDURE TODAY AT [facility]:   Refer to the procedure report that was given to you for any specific questions about what was found during the examination.  If the procedure report does not answer your questions, please call your gastroenterologist to clarify.  If you requested that your care partner not be given the details of your procedure findings, then the procedure report has been included in a sealed envelope for you to review at your convenience later.  YOU SHOULD EXPECT: Some feelings of bloating in the abdomen. Passage of more gas than usual.  Walking can help get rid of the air that was put into your GI tract during the procedure and reduce the bloating. If you had a lower endoscopy (such as a colonoscopy or flexible sigmoidoscopy) you may notice spotting of blood in your stool or on the toilet paper. If you underwent a bowel prep for your procedure, you may not have a normal bowel movement for a few days.  Please Note:  You might notice some irritation and congestion in your nose or some drainage.  This is from the oxygen used during your procedure.  There is no need for concern and it should clear up in a day or so.  SYMPTOMS TO REPORT IMMEDIATELY:  Following lower endoscopy (colonoscopy or flexible sigmoidoscopy):  Excessive amounts of blood in the stool  Significant tenderness or worsening of abdominal pains  Swelling of the abdomen that is new, acute  Fever of 100F or higher   For urgent or emergent issues, a gastroenterologist can be reached at any hour by calling [phone]. Do not use MyChart messaging for urgent concerns.    DIET:  We do recommend a small meal at first, but then you may proceed to your regular diet.  Drink plenty of fluids but you should avoid alcoholic beverages for 24 hours.  ACTIVITY:  You  should plan to take it easy for the rest of today and you should NOT DRIVE or use heavy machinery until tomorrow (because of the sedation medicines used during the test).    FOLLOW UP: Our staff will call the number listed on your records 24-72 hours following your procedure to check on you and address any questions or concerns that you may have regarding the information given to you following your procedure. If we do not reach you, we will leave a message.  We will attempt to reach you two times.  During this call, we will ask if you have developed any symptoms of COVID 19. If you develop any symptoms (ie: fever, flu-like symptoms, shortness of breath, cough etc.) before then, please call [phone].  If you test positive for Covid 19 in the 2 weeks post procedure, please call and report this information to [place].    If any biopsies were taken you will be contacted by phone or by letter within the next 1-3 weeks.  Please call us at [phone] if you have not heard about the biopsies in 3 weeks.    SIGNATURES/CONFIDENTIALITY: You and/or your care partner have signed paperwork which will be entered into your electronic medical record.  These signatures attest to the fact that that the information above on your After Visit Summary has been reviewed and is understood.  Full responsibility of the confidentiality of this discharge information lies with you and/or your care-partner.

## 2021-11-28 ENCOUNTER — Telehealth: Payer: Self-pay

## 2021-11-28 NOTE — Telephone Encounter (Signed)
  Follow up Call-     11/27/2021    8:06 AM  Call back number  Post procedure Call Back phone  # [phone]  Permission to leave phone message Yes     Patient questions:  Do you have a fever, pain , or abdominal swelling? No. Pain Score  0 *  Have you tolerated food without any problems? Yes.    Have you been able to return to your normal activities? Yes.    Do you have any questions about your discharge instructions: Diet   No. Medications  No. Follow up visit  No.  Do you have questions or concerns about your Care? No.  Actions: * If pain score is 4 or above: No action needed, pain <4.

## 2021-12-04 ENCOUNTER — Ambulatory Visit: Payer: Medicare Other | Admitting: Family Medicine

## 2021-12-04 ENCOUNTER — Encounter: Payer: Self-pay | Admitting: Family Medicine

## 2021-12-04 VITALS — BP 142/76 | HR 86 | Ht 67.0 in | Wt 149.2 lb

## 2021-12-04 DIAGNOSIS — E119 Type 2 diabetes mellitus without complications: Secondary | ICD-10-CM

## 2021-12-04 DIAGNOSIS — I10 Essential (primary) hypertension: Secondary | ICD-10-CM

## 2021-12-04 DIAGNOSIS — E785 Hyperlipidemia, unspecified: Secondary | ICD-10-CM | POA: Diagnosis not present

## 2021-12-04 LAB — POCT GLYCOSYLATED HEMOGLOBIN (HGB A1C): HbA1c, POC (controlled diabetic range): 6 % (ref 0.0–7.0)

## 2021-12-04 MED ORDER — METFORMIN HCL 500 MG PO TABS: 500.0000 mg | ORAL_TABLET | Freq: Two times a day (BID) | ORAL | 3 refills | Status: DC

## 2021-12-04 MED ORDER — ATORVASTATIN CALCIUM 40 MG PO TABS
40.0000 mg | ORAL_TABLET | Freq: Every day | ORAL | 2 refills | Status: DC
Start: 2021-12-04 — End: 2022-05-27

## 2021-12-04 NOTE — Assessment & Plan Note (Signed)
A1c today of 6.0, previously 6.2. Continues on metformin 1000 mg twice daily.  We will reduce this to 500 mg twice daily.  We will check a BMP to monitor renal function.  Recommend yearly diabetic eye exam.  Follow-up in 6 months.

## 2021-12-04 NOTE — Progress Notes (Signed)
    SUBJECTIVE:   CHIEF COMPLAINT / HPI:   Diabetic Follow Up: Patient is a 61 y.o. female who present today for diabetic follow up.   Patient endorses no problems  Home medications include: Metformin 1000 mg twice daily Patient endorses taking these medications as prescribed.  Most recent A1Cs:  Lab Results  Component Value Date   HGBA1C 6.0 12/04/2021   HGBA1C 6.2 05/31/2021   HGBA1C 7.0 01/24/2021   Last Microalbumin, LDL, Creatinine: Lab Results  Component Value Date   LDLCALC 21 01/24/2021   CREATININE 0.63 11/06/2020    Patient is up to date on diabetic foot exam.  PERTINENT  PMH / PSH: None relevant  OBJECTIVE:   BP (!) 142/76   Pulse 86   Ht '5\' 7"'$  (1.702 m)   Wt 149 lb 4 oz (67.7 kg)   SpO2 100%   BMI 23.38 kg/m    142/76 on recheck   General: NAD, pleasant, able to participate in exam Respiratory: No respiratory distress Skin: warm and dry, no rashes noted Psych: Normal affect and mood   ASSESSMENT/PLAN:   Diabetes type 2, controlled (HCC) A1c today of 6.0, previously 6.2. Continues on metformin 1000 mg twice daily.  We will reduce this to 500 mg twice daily.  We will check a BMP to monitor renal function.  Recommend yearly diabetic eye exam.  Follow-up in 6 months.  Hyperlipidemia:  Previous LDL of 21 back in August 2022.  Continues on atorvastatin 40 mg daily.  We will check direct LDL today.  Hypertension: Blood pressure elevated 142/76.  Home meds include losartan 25 mg daily, Lopressor 100 mg twice daily.  She does endorse taking her blood pressure medicines correctly.  She states her blood pressure was great when she was going for colonoscopy earlier this week.  Recommended she check her blood pressures at home for the next 1 week and calling with these numbers.  Otherwise recommended a 1 week follow-up for blood pressure check if she is unable to do so.  We will check a BMP as noted above to check her creatinine given history of hypertension  and diabetes.  Lurline Del, [place]

## 2021-12-05 LAB — BASIC METABOLIC PANEL
BUN/Creatinine Ratio: 22 (ref 12–28)
BUN: 12 mg/dL (ref 8–27)
CO2: 23 mmol/L (ref 20–29)
Calcium: 9.8 mg/dL (ref 8.7–10.3)
Chloride: 103 mmol/L (ref 96–106)
Creatinine, Ser: 0.55 mg/dL — ABNORMAL LOW (ref 0.57–1.00)
Glucose: 86 mg/dL (ref 70–99)
Potassium: 4.4 mmol/L (ref 3.5–5.2)
Sodium: 143 mmol/L (ref 134–144)
eGFR: 104 mL/min/{1.73_m2} (ref >59)

## 2021-12-05 LAB — LDL CHOLESTEROL, DIRECT: LDL Direct: 49 mg/dL (ref 0–99)

## 2021-12-06 ENCOUNTER — Encounter: Payer: Self-pay | Admitting: Family Medicine

## 2021-12-06 ENCOUNTER — Encounter: Payer: Self-pay | Admitting: Gastroenterology

## 2021-12-12 ENCOUNTER — Inpatient Hospital Stay: Payer: Medicaid Other

## 2021-12-12 ENCOUNTER — Ambulatory Visit: Payer: Medicaid Other | Admitting: Oncology

## 2022-01-11 ENCOUNTER — Inpatient Hospital Stay: Payer: Medicare Other | Admitting: Oncology

## 2022-01-11 ENCOUNTER — Inpatient Hospital Stay: Payer: Medicare Other

## 2022-01-17 ENCOUNTER — Other Ambulatory Visit: Payer: Self-pay

## 2022-01-17 DIAGNOSIS — I10 Essential (primary) hypertension: Secondary | ICD-10-CM

## 2022-01-17 MED ORDER — GABAPENTIN 400 MG PO CAPS
ORAL_CAPSULE | ORAL | 0 refills | Status: DC
Start: 2022-01-17 — End: 2022-03-18

## 2022-01-17 MED ORDER — METOPROLOL TARTRATE 50 MG PO TABS: ORAL_TABLET | ORAL | 0 refills | Status: DC

## 2022-01-25 ENCOUNTER — Inpatient Hospital Stay: Payer: Medicare Other | Attending: Oncology

## 2022-01-25 ENCOUNTER — Inpatient Hospital Stay (HOSPITAL_BASED_OUTPATIENT_CLINIC_OR_DEPARTMENT_OTHER): Payer: Medicare Other | Admitting: Oncology

## 2022-01-25 VITALS — BP 136/79 | HR 80 | Temp 98.1°F | Resp 20 | Ht 67.0 in | Wt 151.8 lb

## 2022-01-25 DIAGNOSIS — Z933 Colostomy status: Secondary | ICD-10-CM | POA: Diagnosis not present

## 2022-01-25 DIAGNOSIS — Z923 Personal history of irradiation: Secondary | ICD-10-CM | POA: Diagnosis not present

## 2022-01-25 DIAGNOSIS — Z79899 Other long term (current) drug therapy: Secondary | ICD-10-CM | POA: Insufficient documentation

## 2022-01-25 DIAGNOSIS — C20 Malignant neoplasm of rectum: Secondary | ICD-10-CM | POA: Insufficient documentation

## 2022-01-25 DIAGNOSIS — D509 Iron deficiency anemia, unspecified: Secondary | ICD-10-CM | POA: Insufficient documentation

## 2022-01-25 LAB — CEA (ACCESS): CEA (CHCC): 1.26 ng/mL (ref 0.00–5.00)

## 2022-01-25 NOTE — Progress Notes (Signed)
  [place] OFFICE PROGRESS NOTE   Diagnosis: Rectal cancer  INTERVAL HISTORY:   Wanda Baker returns as scheduled.  She feels well.  No difficulty with bowel function.  No complaint.  She underwent a colonoscopy 11/27/2021.  A polyp was removed from the cecum.  The pathology revealed a tubular adenoma.  Objective:  Vital signs in last 24 hours:  Blood pressure 136/79, pulse 80, temperature 98.1 F (36.7 C), temperature source Oral, resp. rate 20, height $RemoveBe'5\' 7"'[id number]$  (1.702 m), weight 151 lb 12.8 oz (68.9 kg), SpO2 98 %.    HEENT: Neck without mass Lymphatics: No cervical, supraclavicular, axillary, or inguinal nodes Resp: Lungs clear bilaterally Cardio: Regular rate and rhythm GI: No mass, nontender, no hepatosplenomegaly Vascular: No leg edema   Lab Results:  Lab Results  Component Value Date   WBC 9.4 03/29/2018   HGB 12.5 03/29/2018   HCT 38.3 03/29/2018   MCV 88.9 03/29/2018   PLT 221 03/29/2018   NEUTROABS 6.8 03/29/2018    CMP  Lab Results  Component Value Date   NA 143 12/04/2021   K 4.4 12/04/2021   CL 103 12/04/2021   CO2 23 12/04/2021   GLUCOSE 86 12/04/2021   BUN 12 12/04/2021   CREATININE 0.55 (L) 12/04/2021   CALCIUM 9.8 12/04/2021   PROT 7.3 03/17/2018   ALBUMIN 4.1 03/17/2018   AST 32 03/17/2018   ALT 27 03/17/2018   ALKPHOS 73 03/17/2018   BILITOT 0.9 03/17/2018   GFRNONAA 102 01/31/2020   GFRAA 118 01/31/2020    Lab Results  Component Value Date   CEA1 1.52 08/14/2020   CEA 1.26 01/25/2022     Medications: I have reviewed the patient's current medications.   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), status post a low anterior resection and diverting colostomy 09/18/2016-mass noted just below the peritoneal reflection Tumor noted at 15 cm on sigmoidoscopy 09/17/2016 CT abdomen/pelvis 09/16/2016-colon obstruction, mass at the "distal sigmoid colon " Elevated preoperative CEA Low anterior resection  09/18/2016, pT3,pN0, 0/20 lymph nodes positive, extracellular mucin without definitive tumor on the inked serosal surface MSI-stable, no loss of mismatch repair protein expression Initiation of Xeloda/radiation 11/21/2016; completion of Xeloda/radiation 12/31/2016 Cycle 1 adjuvant Xeloda 01/27/2017 Cycle 2 adjuvant Xeloda 02/17/2017 Cycle 3 adjuvant Xeloda 03/10/2017 Cycle 4 adjuvant Xeloda 03/31/2017 (Xeloda dose escalated by one tablet daily)  Cycle 5 adjuvant Xeloda 04/21/2017 Colonoscopy 04/18/2017-tubular adenoma removed from the ascending colon Colostomy takedown and creation of an ileostomy 08/08/2017 Takedown of diverting ileostomy 03/27/2018 Negative colonoscopy 06/04/2018 CTs 06/15/2020-no evidence of recurrent disease Colonoscopy 11/27/2021-cecal polyp, tubular adenoma   2.   History of a colonic obstruction secondary to #1   3.   Iron deficiency anemia secondary to #1.  Resolved         Disposition: Wanda Baker is in clinical remission from rectal cancer.  She is now greater than 5 years out from diagnosis.  She has a good prognosis for a long-term disease-free survival.  She plans to continue clinical follow-up with Dr. Silverio Decamp and her primary provider.  She is scheduled to see Dr. Dema Severin in January.  Ms. Wescott was discharged from the medical oncology clinic today.  I am available to see her in the future as needed.  [name], MD  01/25/2022  12:45 PM

## 2022-02-13 ENCOUNTER — Other Ambulatory Visit: Payer: Self-pay | Admitting: Family Medicine

## 2022-02-13 DIAGNOSIS — E119 Type 2 diabetes mellitus without complications: Secondary | ICD-10-CM

## 2022-03-18 ENCOUNTER — Ambulatory Visit (INDEPENDENT_AMBULATORY_CARE_PROVIDER_SITE_OTHER): Payer: 59

## 2022-03-18 ENCOUNTER — Other Ambulatory Visit: Payer: Self-pay

## 2022-03-18 DIAGNOSIS — Z23 Encounter for immunization: Secondary | ICD-10-CM | POA: Diagnosis not present

## 2022-03-18 DIAGNOSIS — K219 Gastro-esophageal reflux disease without esophagitis: Secondary | ICD-10-CM

## 2022-03-18 MED ORDER — FAMOTIDINE 20 MG PO TABS: ORAL_TABLET | ORAL | 2 refills | Status: DC

## 2022-03-18 MED ORDER — GABAPENTIN 400 MG PO CAPS
ORAL_CAPSULE | ORAL | 0 refills | Status: DC
Start: 2022-03-18 — End: 2022-05-15

## 2022-03-18 NOTE — Progress Notes (Signed)
Patient presents to nurse clinic for flu vaccination. Administered in LD, site unremarkable, tolerated injection well.   Makenly Larabee C Moneisha Vosler, RN   

## 2022-04-16 ENCOUNTER — Other Ambulatory Visit: Payer: Self-pay | Admitting: Family Medicine

## 2022-04-16 DIAGNOSIS — I10 Essential (primary) hypertension: Secondary | ICD-10-CM

## 2022-04-24 ENCOUNTER — Ambulatory Visit (INDEPENDENT_AMBULATORY_CARE_PROVIDER_SITE_OTHER): Payer: Medicare Other | Admitting: Family Medicine

## 2022-04-24 VITALS — BP 124/70 | HR 87 | Ht 67.0 in | Wt 161.0 lb

## 2022-04-24 DIAGNOSIS — N3000 Acute cystitis without hematuria: Secondary | ICD-10-CM

## 2022-04-24 DIAGNOSIS — Z23 Encounter for immunization: Secondary | ICD-10-CM | POA: Diagnosis not present

## 2022-04-24 DIAGNOSIS — R300 Dysuria: Secondary | ICD-10-CM | POA: Diagnosis not present

## 2022-04-24 LAB — POCT URINALYSIS DIP (MANUAL ENTRY)
Bilirubin, UA: NEGATIVE
Glucose, UA: NEGATIVE mg/dL
Ketones, POC UA: NEGATIVE mg/dL
Nitrite, UA: POSITIVE — AB
Protein Ur, POC: 30 mg/dL — AB
Spec Grav, UA: 1.015 (ref 1.010–1.025)
Urobilinogen, UA: 0.2 E.U./dL
pH, UA: 5.5 (ref 5.0–8.0)

## 2022-04-24 MED ORDER — SULFAMETHOXAZOLE-TRIMETHOPRIM 800-160 MG PO TABS: 1.0000 | ORAL_TABLET | Freq: Two times a day (BID) | ORAL | 0 refills | Status: AC

## 2022-04-24 NOTE — Patient Instructions (Addendum)
It was wonderful to see you today.  Please bring ALL of your medications with you to every visit.   Today we talked about:  It looks like you have a urinary tract infection. I have sent in antibiotics for this. I have also sent a urine culture which will take some time to result. If the results show resistance to these antibiotics I will call you to let you know.  Thank you for coming to your visit as scheduled. We have had a large "no-show" problem lately, and this significantly limits our ability to see and care for patients. As a friendly reminder- if you cannot make your appointment please call to cancel. We do have a no show policy for those who do not cancel within 24 hours. Our policy is that if you miss or fail to cancel an appointment within 24 hours, 3 times in a 74-monthperiod, you may be dismissed from our clinic.   Thank you for choosing [facility]   Please call [phone] any questions about today's appointment.  Please be sure to schedule follow up at the front  desk before you leave today.   ASharion Settler DO PGY-3 Family Medicine

## 2022-04-24 NOTE — Assessment & Plan Note (Addendum)
History and UA consistent with UTI (+nitrates, large lueks, small blood). No fevers, N/V or CVA tenderness to suggest more severe infection. Has PCN allergy, she has been treated with Bactrim in the past with good relief. Last Ucx shows intermediate sensitivity to Nitrofurantoin but sensitivity to Bactrim. Will send in 3 days of Bactrim. Have sent Ucx, will adjust abx if needed.

## 2022-04-24 NOTE — Progress Notes (Signed)
    SUBJECTIVE:   CHIEF COMPLAINT / HPI:   Wanda Baker is a 61 y.o. female who presents to the [facility] clinic today to discuss the following concerns:   Concern for Bladder Infection She has been having urinary frequency, urgency, dysuria and some bilateral lower back pain since Monday evening. Has not noticed any blood. No abdominal pain. She has been taking Tylenol which helps some. No fevers, nausea or vomiting. She has had UTI in the past, she believes last infection was in January. No hx of pyelonephritis, renal stones or hospitalizations for urinary symptoms.   Per chart review last Ucx grew 50-100k of klebsiella aerogenes.  She has allergies to PCN: has nausea, vomiting with this.   PERTINENT  PMH / PSH: Hypertension, rectal cancer, type 2 diabetes, hyperlipidemia  OBJECTIVE:   BP 124/70   Pulse 87   Ht '5\' 7"'$  (1.702 m)   Wt 161 lb (73 kg)   SpO2 99%   BMI 25.22 kg/m    General: NAD, pleasant, able to participate in exam Cardiac: RRR, no murmurs. Respiratory: CTAB, normal effort, No wheezes, rales or rhonchi Abdomen: Bowel sounds present, nontender to all quadrants, nondistended Back: No CVAT  Psych: Normal affect and mood  ASSESSMENT/PLAN:   Urinary tract infection History and UA consistent with UTI (+nitrates, large lueks, small blood). No fevers, N/V or CVA tenderness to suggest more severe infection. Has PCN allergy, she has been treated with Bactrim in the past with good relief. Last Ucx shows intermediate sensitivity to Nitrofurantoin but sensitivity to Bactrim. Will send in 3 days of Bactrim. Have sent Ucx, will adjust abx if needed.     Sharion Settler, [place]

## 2022-04-26 ENCOUNTER — Other Ambulatory Visit: Payer: Self-pay | Admitting: Family Medicine

## 2022-04-26 DIAGNOSIS — I10 Essential (primary) hypertension: Secondary | ICD-10-CM

## 2022-04-27 LAB — URINE CULTURE

## 2022-05-02 ENCOUNTER — Other Ambulatory Visit: Payer: Self-pay | Admitting: Family Medicine

## 2022-05-02 DIAGNOSIS — E119 Type 2 diabetes mellitus without complications: Secondary | ICD-10-CM

## 2022-05-15 ENCOUNTER — Other Ambulatory Visit: Payer: Self-pay | Admitting: Family Medicine

## 2022-05-27 ENCOUNTER — Ambulatory Visit (INDEPENDENT_AMBULATORY_CARE_PROVIDER_SITE_OTHER): Payer: Medicare Other | Admitting: Family Medicine

## 2022-05-27 ENCOUNTER — Encounter: Payer: Self-pay | Admitting: Family Medicine

## 2022-05-27 VITALS — BP 123/65 | HR 78 | Temp 98.6°F | Ht 67.0 in | Wt 162.6 lb

## 2022-05-27 DIAGNOSIS — E785 Hyperlipidemia, unspecified: Secondary | ICD-10-CM

## 2022-05-27 DIAGNOSIS — E119 Type 2 diabetes mellitus without complications: Secondary | ICD-10-CM | POA: Diagnosis not present

## 2022-05-27 LAB — POCT GLYCOSYLATED HEMOGLOBIN (HGB A1C): HbA1c, POC (controlled diabetic range): 7.1 % — AB (ref 0.0–7.0)

## 2022-05-27 MED ORDER — ATORVASTATIN CALCIUM 40 MG PO TABS: 40.0000 mg | ORAL_TABLET | Freq: Every day | ORAL | 2 refills | Status: AC

## 2022-05-27 MED ORDER — METFORMIN HCL 1000 MG PO TABS: 1000.0000 mg | ORAL_TABLET | Freq: Two times a day (BID) | ORAL | 5 refills | Status: DC

## 2022-05-27 NOTE — Progress Notes (Signed)
  SUBJECTIVE:   CHIEF COMPLAINT / HPI:   DM -On metformin 500 BID -Had tried Jardiance in the past which did not work well for her. Got yeast infections and rash -Denies recent vision changes, lightheadedness/syncope, neuropathic pain, polyuria  Reports getting shingles vaccine at pharmacy  Requests refill on lipitor   OBJECTIVE:  BP 123/65   Pulse 78   Temp 98.6 F (37 C)   Ht '5\' 7"'$  (1.702 m)   Wt 162 lb 9.6 oz (73.8 kg)   SpO2 98%   BMI 25.47 kg/m   General: NAD, pleasant, able to participate in exam Cardiac: RRR, no murmurs auscultated Respiratory: CTAB, normal WOB Abdomen: soft, non-tender, non-distended, normoactive bowel sounds Neuro: alert, no obvious focal deficits, speech normal Psych: Normal affect and mood  ASSESSMENT/PLAN:   Controlled type 2 diabetes mellitus without complication, without long-term current use of insulin (HCC) Assessment & Plan: Previously on metformin 1000 twice daily, but this was decreased to 500 twice daily at last visit due to well-controlled A1c (6.0).  A1c today is 7.1, so will increase her metformin back to 1000 twice daily as this kept her under good control.  Follow-up in 6 months.  Orders: -     POCT glycosylated hemoglobin (Hb A1C) -     metFORMIN HCl; Take 1 tablet (1,000 mg total) by mouth 2 (two) times daily with a meal.  Dispense: 120 tablet; Refill: 5  Hyperlipidemia, unspecified hyperlipidemia type -     Atorvastatin Calcium; Take 1 tablet (40 mg total) by mouth daily.  Dispense: 90 tablet; Refill: 2   Meds ordered this encounter  Medications   metFORMIN (GLUCOPHAGE) 1000 MG tablet    Sig: Take 1 tablet (1,000 mg total) by mouth 2 (two) times daily with a meal.    Dispense:  120 tablet    Refill:  5   atorvastatin (LIPITOR) 40 MG tablet    Sig: Take 1 tablet (40 mg total) by mouth daily.    Dispense:  90 tablet    Refill:  2   Return in about 6 months (around 11/26/2022) for A1c.  August Albino, MD [facility]

## 2022-05-27 NOTE — Patient Instructions (Signed)
It was wonderful to see you today.  Please bring ALL of your medications with you to every visit.   Updates from today's visit:  Your A1c is 7.1 today.  I have increased your dose of metformin to 1000 mg twice daily.  Hopefully this works better for you.  Please follow up in 6 months or sooner as needed  Thank you for choosing [facility].   Please call [phone] with any questions about today's appointment.  Please be sure to schedule follow up at the front  desk before you leave today.   August Albino, MD  Family Medicine

## 2022-05-27 NOTE — Assessment & Plan Note (Signed)
Previously on metformin 1000 twice daily, but this was decreased to 500 twice daily at last visit due to well-controlled A1c (6.0).  A1c today is 7.1, so will increase her metformin back to 1000 twice daily as this kept her under good control.  Follow-up in 6 months.

## 2022-06-19 ENCOUNTER — Encounter: Payer: Self-pay | Admitting: Family Medicine

## 2022-06-19 ENCOUNTER — Ambulatory Visit (INDEPENDENT_AMBULATORY_CARE_PROVIDER_SITE_OTHER): Payer: Medicare Other | Admitting: Family Medicine

## 2022-06-19 VITALS — BP 143/73 | HR 93 | Ht 67.0 in | Wt 159.0 lb

## 2022-06-19 DIAGNOSIS — H65191 Other acute nonsuppurative otitis media, right ear: Secondary | ICD-10-CM

## 2022-06-19 DIAGNOSIS — J3489 Other specified disorders of nose and nasal sinuses: Secondary | ICD-10-CM | POA: Diagnosis not present

## 2022-06-19 MED ORDER — DOXYCYCLINE HYCLATE 100 MG PO TABS: 100.0000 mg | ORAL_TABLET | Freq: Two times a day (BID) | ORAL | 0 refills | Status: AC

## 2022-06-19 NOTE — Patient Instructions (Signed)
Your ear does look like it has an infection, to be on the safe side I am going to go ahead and prescribe an antibiotic called doxycyline which you will take twice daily for the next 5 days. If at that time you have not noticed any improvement, please call and we can extend the course of antibiotics if we think it is necessary.  You can also use flonase to help with some of the sinus symptoms you are having.

## 2022-06-19 NOTE — Progress Notes (Signed)
    SUBJECTIVE:   CHIEF COMPLAINT / HPI:   Sick symptoms - HA and cough started Sunday night, mucinex didn't help - Pressure and drainage started Monday night from the left ear - Congestion present in the sinuses with pain - Denies any fevers   PERTINENT  PMH / PSH: Reviewed  OBJECTIVE:   BP (!) 143/73   Pulse 93   Ht '5\' 7"'$  (1.702 m)   Wt 159 lb (72.1 kg)   SpO2 98%   BMI 24.90 kg/m   Gen: well-appearing, NAD CV: RRR, no m/r/g appreciated, no peripheral edema Pulm: CTAB, no wheezes/crackles GI: soft, non-tender, non-distended HEENT: Left TM erythematous with discharge present, right TM with mild effusion, TTP in the sinus regions (b/l maxillary and frontal), oropharynx clear  ASSESSMENT/PLAN:   Acute otitis media Physical exam consistent with an otitis media of the left ear. Will treat with doxycyline as the patient has an allergy to PCN.  - Doxycycline '100mg'$  BID x5 days - Follow-up if not improving    Wanda Baker, [place]

## 2022-07-16 ENCOUNTER — Other Ambulatory Visit: Payer: Self-pay | Admitting: Family Medicine

## 2022-07-16 DIAGNOSIS — I10 Essential (primary) hypertension: Secondary | ICD-10-CM

## 2022-07-27 ENCOUNTER — Other Ambulatory Visit: Payer: Self-pay | Admitting: Family Medicine

## 2022-07-27 DIAGNOSIS — E119 Type 2 diabetes mellitus without complications: Secondary | ICD-10-CM

## 2022-09-14 ENCOUNTER — Other Ambulatory Visit: Payer: Self-pay | Admitting: Family Medicine

## 2022-10-01 ENCOUNTER — Other Ambulatory Visit: Payer: Self-pay | Admitting: Family Medicine

## 2022-10-01 DIAGNOSIS — Z1231 Encounter for screening mammogram for malignant neoplasm of breast: Secondary | ICD-10-CM

## 2022-10-01 DIAGNOSIS — I10 Essential (primary) hypertension: Secondary | ICD-10-CM

## 2022-10-17 ENCOUNTER — Telehealth: Payer: Self-pay | Admitting: Family Medicine

## 2022-10-17 NOTE — Telephone Encounter (Signed)
Contacted Wanda Baker to schedule their annual wellness visit. Appointment made for 10/21/2022.  Thank you,  [facility] Support [facility] Direct dial  [phone]

## 2022-10-21 ENCOUNTER — Ambulatory Visit (INDEPENDENT_AMBULATORY_CARE_PROVIDER_SITE_OTHER): Payer: 59

## 2022-10-21 VITALS — Ht 67.0 in | Wt 159.0 lb

## 2022-10-21 DIAGNOSIS — Z0000 Encounter for general adult medical examination without abnormal findings: Secondary | ICD-10-CM

## 2022-10-21 NOTE — Progress Notes (Signed)
Subjective:   Wanda Baker is a 62 y.o. female who presents for an Initial Medicare Annual Wellness Visit.  I connected with  Wanda Baker on 10/21/22 by a audio enabled telemedicine application and verified that I am speaking with the correct person using two identifiers.  Patient Location: Home  Provider Location: Home Office  I discussed the limitations of evaluation and management by telemedicine. The patient expressed understanding and agreed to proceed.  Review of Systems     Cardiac Risk Factors include: diabetes mellitus;dyslipidemia;hypertension;sedentary lifestyle     Objective:    Today's Vitals   10/21/22 0912  Weight: 159 lb (72.1 kg)  Height: 5\' 7"  (1.702 m)   Body mass index is 24.9 kg/m.     10/21/2022    9:15 AM 06/19/2022    2:05 PM 04/24/2022   10:13 AM 12/04/2021    4:04 PM 05/31/2021    4:14 PM 01/24/2021   11:04 AM 10/25/2020   10:16 AM  Advanced Directives  Does Patient Have a Medical Advance Directive? No No No No No No No  Would patient like information on creating a medical advance directive? No - Patient declined No - Patient declined No - Patient declined No - Patient declined No - Patient declined No - Patient declined No - Patient declined    Current Medications (verified) Outpatient Encounter Medications as of 10/21/2022  Medication Sig   acetaminophen (TYLENOL) 650 MG CR tablet Take 1,300 mg by mouth every 8 (eight) hours as needed for pain.   atorvastatin (LIPITOR) 40 MG tablet Take 1 tablet (40 mg total) by mouth daily.   cetirizine (ZYRTEC) 10 MG tablet Take 1 tablet (10 mg total) by mouth at bedtime.   diclofenac Sodium (VOLTAREN) 1 % GEL Apply 2 g topically 4 (four) times daily.   famotidine (PEPCID) 20 MG tablet TAKE 1 TABLET(20 MG) BY MOUTH TWICE DAILY   gabapentin (NEURONTIN) 400 MG capsule TAKE 1 CAPSULE BY MOUTH THREE TIMES DAILY AS NEEDED   losartan (COZAAR) 25 MG tablet TAKE 1 TABLET(25 MG) BY MOUTH AT BEDTIME   metFORMIN  (GLUCOPHAGE) 1000 MG tablet Take 1 tablet (1,000 mg total) by mouth 2 (two) times daily with a meal.   metoprolol tartrate (LOPRESSOR) 50 MG tablet TAKE 2 TABLETS(100 MG) BY MOUTH DAILY   Multiple Vitamins-Minerals (ALIVE ONCE DAILY WOMENS 50+ PO) Take 1 tablet by mouth daily.    psyllium (METAMUCIL) 58.6 % packet Take 1 packet by mouth daily.   saccharomyces boulardii (FLORASTOR) 250 MG capsule Take 1 capsule (250 mg total) by mouth 2 (two) times daily.   No facility-administered encounter medications on file as of 10/21/2022.    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 09/18/2016   Weakness    left arm resolved now  Past Surgical History:  Procedure Laterality Date   ABDOMINAL HYSTERECTOMY  2011   Supracervical Hysterectomy complete   ANTERIOR CERVICAL DECOMP/DISCECTOMY FUSION N/A 12/02/2013   Procedure: ANTERIOR CERVICAL DECOMPRESSION/DISCECTOMY FUSION 3 LEVELS;  Surgeon: Maeola Harman, MD;  Location: [facility];  Service: Neurosurgery;  Laterality: N/A;  C4-5 C5-6 C6-7 Anterior cervical decompression/diskectomy/fusion   BOWEL RESECTION N/A 09/18/2016   Procedure: LOW ANTERIOR COLON RESECTION WITH COLOSTOMY;  Surgeon: Claud Kelp, MD;  Location: [facility] OR;  Service: General;  Laterality: N/A;   BOWEL RESECTION  08/08/2017   Procedure: SMALL BOWEL RESECTION x 2;  Surgeon: Andria Meuse, MD;  Location: [facility];  Service: General;;   CESAREAN SECTION  86/88   COLONOSCOPY  06/04/2018   Dr.Nandigam   COLOSTOMY TAKEDOWN N/A 08/08/2017   Procedure: Donell Sievert TAKEDOWN OF COLOSTOMY WITH COLORECTAL ANASTOMOIS ERAS PATHWAY;  Surgeon: Andria Meuse, MD;  Location: [facility];  Service: General;  Laterality: N/A;   CYSTOSCOPY W/ URETERAL STENT PLACEMENT N/A 08/08/2017   Procedure: CYSTOSCOPY WITH URETERAL CATHETERS;  Surgeon: Crist Fat, MD;  Location: [facility];  Service: Urology;  Laterality: N/A;   FLEXIBLE SIGMOIDOSCOPY N/A 09/17/2016   Procedure: FLEXIBLE SIGMOIDOSCOPY;  Surgeon: Napoleon Form, MD;  Location: [facility];  Service: Endoscopy;  Laterality: N/A;   FLEXIBLE SIGMOIDOSCOPY N/A 08/08/2017   Procedure: FLEXIBLE SIGMOIDOSCOPY;  Surgeon: Andria Meuse, MD;  Location: [facility];  Service: General;  Laterality: N/A;   FLEXIBLE SIGMOIDOSCOPY N/A 11/24/2017   Procedure: FLEXIBLE SIGMOIDOSCOPY;  Surgeon: Andria Meuse, MD;  Location: [facility];  Service: General;  Laterality: N/A;   ILEOSTOMY N/A 08/08/2017   Procedure: DIVERTIN  ILEOSTOMY;  Surgeon: Andria Meuse, MD;  Location: [facility];  Service: General;  Laterality: N/A;   LAPAROSCOPY N/A 03/27/2018   Procedure: CLOSURE OF ILEOSTOMY;  Surgeon: Andria Meuse, MD;  Location: [facility];  Service: General;  Laterality: N/A;   LAPAROTOMY N/A 08/08/2017   Procedure: EXPLORATORY LAPAROTOMY;  Surgeon: Andria Meuse, MD;  Location: [facility];  Service: General;  Laterality: N/A;   LYSIS OF ADHESION  08/08/2017   Procedure: LYSIS OF ADHESION;  Surgeon: Andria Meuse, MD;  Location: [facility];  Service: General;;   POLYPECTOMY     RECTAL EXAM UNDER ANESTHESIA  11/24/2017   Procedure: RECTAL EXAM UNDER ANESTHESIA;  Surgeon: Andria Meuse, MD;  Location: [facility];  Service: General;;   SIGMOIDOSCOPY     TUBAL LIGATION     Family History  Problem Relation Age of  Onset   Heart disease Maternal Grandmother    Diabetes Maternal Grandmother    Hypertension Maternal Grandmother    Heart disease Maternal Grandfather    Heart disease Paternal Grandmother    Diabetes Paternal Grandmother    Alzheimer's disease Paternal Grandmother    Esophageal cancer Paternal Uncle    Colon cancer Neg Hx    Colon polyps Neg Hx    Rectal cancer Neg Hx    Stomach cancer Neg Hx    Social History   Socioeconomic History   Marital status: Divorced    Spouse name: Not on file   Number of children: Not on file   Years of education: Not on file   Highest education level: Not on file  Occupational History   Not on file  Tobacco Use   Smoking status: Former    Packs/day: 0.50    Years: 15.00    Additional pack years: 0.00  Total pack years: 7.50    Types: Cigarettes   Smokeless tobacco: Never   Tobacco comments:    quit smoking 2011  Vaping Use   Vaping Use: Never used  Substance and Sexual Activity   Alcohol use: No   Drug use: No   Sexual activity: Never    Birth control/protection: Surgical  Other Topics Concern   Not on file  Social History Narrative   lives with adult sons and mother and father   divorced   47 YO son has autism   Social Determinants of Health   Financial Resource Strain: Low Risk  (10/21/2022)   Overall Financial Resource Strain (CARDIA)    Difficulty of Paying Living Expenses: Not hard at all  Food Insecurity: No Food Insecurity (10/21/2022)   Hunger Vital Sign    Worried About Running Out of Food in the Last Year: Never true    Ran Out of Food in the Last Year: Never true  Transportation Needs: No Transportation Needs (10/21/2022)   PRAPARE - Administrator, Civil Service (Medical): No    Lack of Transportation (Non-Medical): No  Physical Activity: Insufficiently Active (10/21/2022)   Exercise Vital Sign    Days of Exercise per Week: 3 days    Minutes of Exercise per Session: 30 min  Stress: No Stress Concern  Present (10/21/2022)   [employer] of Occupational Health - Occupational Stress Questionnaire    Feeling of Stress : Not at all  Social Connections: Socially Isolated (10/21/2022)   Social Connection and Isolation Panel [NHANES]    Frequency of Communication with Friends and Family: More than three times a week    Frequency of Social Gatherings with Friends and Family: Three times a week    Attends Religious Services: Never    Active Member of Clubs or Organizations: No    Attends Banker Meetings: Never    Marital Status: Divorced    Tobacco Counseling Counseling given: Not Answered Tobacco comments: quit smoking 2011   Clinical Intake:  Pre-visit preparation completed: Yes  Pain : No/denies pain  Diabetes: Yes CBG done?: No Did pt. bring in CBG monitor from home?: No  How often do you need to have someone help you when you read instructions, pamphlets, or other written materials from your doctor or pharmacy?: 2 - Rarely  Diabetic?Yes   Nutrition Risk Assessment:  Has the patient had any N/V/D within the last 2 months?  No  Does the patient have any non-healing wounds?  No  Has the patient had any unintentional weight loss or weight gain?  No   Diabetes:  Is the patient diabetic?  Yes  If diabetic, was a CBG obtained today?  No  Did the patient bring in their glucometer from home?  No  How often do you monitor your CBG's? daily.   Financial Strains and Diabetes Management:  Are you having any financial strains with the device, your supplies or your medication? No .  Does the patient want to be seen by Chronic Care Management for management of their diabetes?  No  Would the patient like to be referred to a Nutritionist or for Diabetic Management?  No   Diabetic Exams:  Diabetic Eye Exam: Overdue for diabetic eye exam. Pt has been advised about the importance in completing this exam. Patient advised to call and schedule an eye exam. Diabetic Foot  Exam: Overdue, Pt has been advised about the importance in completing this exam. Pt is scheduled  for diabetic foot exam on at next office visit .   Interpreter Needed?: No  Information entered by :: Kandis Fantasia LPN   Activities of Daily Living    10/21/2022    9:15 AM  In your present state of health, do you have any difficulty performing the following activities:  Hearing? 0  Vision? 0  Difficulty concentrating or making decisions? 0  Walking or climbing stairs? 0  Dressing or bathing? 0  Doing errands, shopping? 0  Preparing Food and eating ? N  Using the Toilet? N  In the past six months, have you accidently leaked urine? N  Do you have problems with loss of bowel control? N  Managing your Medications? N  Managing your Finances? N  Housekeeping or managing your Housekeeping? N    Patient Care Team: Vonna Drafts, MD as PCP - General (Family Medicine) Andria Meuse, MD as Consulting Physician (General Surgery) Claud Kelp, MD as Consulting Physician (General Surgery) Napoleon Form, MD as Consulting Physician (Gastroenterology)  Indicate any recent Medical Services you may have received from other than [facility] providers in the past year (date may be approximate).     Assessment:   This is a routine wellness examination for [place].  Hearing/Vision screen Hearing Screening - Comments:: Denies hearing difficulties   Vision Screening - Comments:: No vision problems; will schedule routine eye exam soon    Dietary issues and exercise activities discussed: Current Exercise Habits: Home exercise routine, Type of exercise: walking, Time (Minutes): 30, Frequency (Times/Week): 3, Weekly Exercise (Minutes/Week): 90, Intensity: Mild   Goals Addressed             This Visit's Progress    Increase physical activity        Depression Screen    10/21/2022    9:13 AM 06/19/2022    2:05 PM 04/24/2022   10:13 AM 12/04/2021    4:04 PM 05/31/2021    4:13 PM  01/24/2021   10:59 AM 10/25/2020   10:17 AM  PHQ 2/9 Scores  PHQ - 2 Score 0 0 0 0 0 0 0  PHQ- 9 Score  0 0 0 0 0 0    Fall Risk    10/21/2022    9:15 AM 11/25/2019    9:24 AM 10/01/2019   10:43 AM 02/17/2019    9:24 AM 01/15/2018    9:30 AM  Fall Risk   Falls in the past year? 0 0 0 0 No  Number falls in past yr: 0 0 0 0   Injury with Fall? 0  0    Risk for fall due to : No Fall Risks      Follow up Falls prevention discussed;Education provided;Falls evaluation completed Falls evaluation completed       FALL RISK PREVENTION PERTAINING TO THE HOME:  Any stairs in or around the home? No  If so, are there any without handrails? No  Home free of loose throw rugs in walkways, pet beds, electrical cords, etc? Yes  Adequate lighting in your home to reduce risk of falls? Yes   ASSISTIVE DEVICES UTILIZED TO PREVENT FALLS:  Life alert? No  Use of a cane, walker or w/c? No  Grab bars in the bathroom? Yes  Shower chair or bench in shower? No  Elevated toilet seat or a handicapped toilet? Yes   TIMED UP AND GO:  Was the test performed? No .  Telephonic visit   Cognitive Function:  10/21/2022    9:15 AM  6CIT Screen  What Year? 0 points  What month? 0 points  What time? 0 points  Count back from 20 0 points  Months in reverse 0 points  Repeat phrase 0 points  Total Score 0 points    Immunizations Immunization History  Administered Date(s) Administered   COVID-19, mRNA, vaccine(Comirnaty)12 years and older 04/24/2022   Influenza Split 03/04/2011, 03/24/2012   Influenza Whole 04/27/2009   Influenza,inj,Quad PF,6+ Mos 02/19/2013, 02/22/2013, 06/03/2014, 03/11/2017, 02/17/2018, 02/17/2019, 04/17/2020, 03/18/2022   PFIZER Comirnaty(Gray Top)Covid-19 Tri-Sucrose Vaccine 07/09/2020   PFIZER(Purple Top)SARS-COV-2 Vaccination 12/07/2019, 12/18/2019   Pfizer Covid-19 Vaccine Bivalent Booster 39yrs & up 05/31/2021   Pneumococcal Conjugate-13 06/05/2018   Td 09/19/2002   Tdap  07/16/2012    TDAP status: Due, Education has been provided regarding the importance of this vaccine. Advised may receive this vaccine at local pharmacy or Health Dept. Aware to provide a copy of the vaccination record if obtained from local pharmacy or Health Dept. Verbalized acceptance and understanding.  Flu Vaccine status: Up to date  Pneumococcal vaccine status: Up to date  Covid-19 vaccine status: Information provided on how to obtain vaccines.   Qualifies for Shingles Vaccine? Yes   Zostavax completed No   Shingrix Completed?: No.    Education has been provided regarding the importance of this vaccine. Patient has been advised to call insurance company to determine out of pocket expense if they have not yet received this vaccine. Advised may also receive vaccine at local pharmacy or Health Dept. Verbalized acceptance and understanding.  Screening Tests Health Maintenance  Topic Date Due   Diabetic kidney evaluation - Urine ACR  Never done   Zoster Vaccines- Shingrix (1 of 2) Never done   OPHTHALMOLOGY EXAM  09/11/2014   FOOT EXAM  01/24/2022   COVID-19 Vaccine (6 - 2023-24 season) 06/19/2022   DTaP/Tdap/Td (3 - Td or Tdap) 07/16/2022   PAP SMEAR-Modifier  11/25/2022   HEMOGLOBIN A1C  11/26/2022   Diabetic kidney evaluation - eGFR measurement  12/05/2022   INFLUENZA VACCINE  01/16/2023   MAMMOGRAM  10/12/2023   Medicare Annual Wellness (AWV)  10/21/2023   COLONOSCOPY (Pts 45-14yrs Insurance coverage will need to be confirmed)  11/27/2024   Hepatitis C Screening  Completed   HIV Screening  Completed   HPV VACCINES  Aged Out    Health Maintenance  Health Maintenance Due  Topic Date Due   Diabetic kidney evaluation - Urine ACR  Never done   Zoster Vaccines- Shingrix (1 of 2) Never done   OPHTHALMOLOGY EXAM  09/11/2014   FOOT EXAM  01/24/2022   COVID-19 Vaccine (6 - 2023-24 season) 06/19/2022   DTaP/Tdap/Td (3 - Td or Tdap) 07/16/2022   PAP SMEAR-Modifier  11/25/2022     Colorectal cancer screening: Type of screening: Colonoscopy. Completed 11/27/21. Repeat every 3 years  Mammogram status: Completed 10/11/21. Repeat every year (scheduled for 11/08/22)  Lung Cancer Screening: (Low Dose CT Chest recommended if Age 45-80 years, 30 pack-year currently smoking OR have quit w/in 15years.) does not qualify.   Lung Cancer Screening Referral: n/a  Additional Screening:  Hepatitis C Screening: does qualify; Completed 09/21/15  Vision Screening: Recommended annual ophthalmology exams for early detection of glaucoma and other disorders of the eye. Is the patient up to date with their annual eye exam?  No  Who is the provider or what is the name of the office in which the patient attends annual eye exams? None  If pt is not established with a provider, would they like to be referred to a provider to establish care? No .   Dental Screening: Recommended annual dental exams for proper oral hygiene  Community Resource Referral / Chronic Care Management: CRR required this visit?  No   CCM required this visit?  No      Plan:     I have personally reviewed and noted the following in the patient's chart:   Medical and social history Use of alcohol, tobacco or illicit drugs  Current medications and supplements including opioid prescriptions. Patient is not currently taking opioid prescriptions. Functional ability and status Nutritional status Physical activity Advanced directives List of other physicians Hospitalizations, surgeries, and ER visits in previous 12 months Vitals Screenings to include cognitive, depression, and falls Referrals and appointments  In addition, I have reviewed and discussed with patient certain preventive protocols, quality metrics, and best practice recommendations. A written personalized care plan for preventive services as well as general preventive health recommendations were provided to patient.     Durwin Nora,  [place]   06/22/1094   Due to this being a virtual visit, the after visit summary with patients personalized plan was offered to patient via mail or my-chart. per request, patient was mailed a copy of AVS.  Nurse Notes: No concerns

## 2022-10-21 NOTE — Patient Instructions (Signed)
Ms. Confair , Thank you for taking time to come for your Medicare Wellness Visit. I appreciate your ongoing commitment to your health goals. Please review the following plan we discussed and let me know if I can assist you in the future.   These are the goals we discussed:  Goals      Increase physical activity        This is a list of the screening recommended for you and due dates:  Health Maintenance  Topic Date Due   Yearly kidney health urinalysis for diabetes  Never done   Zoster (Shingles) Vaccine (1 of 2) Never done   Eye exam for diabetics  09/11/2014   Complete foot exam   01/24/2022   COVID-19 Vaccine (6 - 2023-24 season) 06/19/2022   DTaP/Tdap/Td vaccine (3 - Td or Tdap) 07/16/2022   Pap Smear  11/25/2022   Hemoglobin A1C  11/26/2022   Yearly kidney function blood test for diabetes  12/05/2022   Flu Shot  01/16/2023   Mammogram  10/12/2023   Medicare Annual Wellness Visit  10/21/2023   Colon Cancer Screening  11/27/2024   Hepatitis C Screening: USPSTF Recommendation to screen - Ages 18-79 yo.  Completed   HIV Screening  Completed   HPV Vaccine  Aged Out    Advanced directives: Forms are available if you choose in the future to pursue completion.  This is recommended in order to make sure that your health wishes are honored in the event that you are unable to verbalize them to the provider.    Conditions/risks identified: Aim for 30 minutes of exercise or brisk walking, 6-8 glasses of water, and 5 servings of fruits and vegetables each day.  Next appointment: Follow up in one year for your annual wellness visit.   Preventive Care 40-64 Years, Female Preventive care refers to lifestyle choices and visits with your health care provider that can promote health and wellness. What does preventive care include? A yearly physical exam. This is also called an annual well check. Dental exams once or twice a year. Routine eye exams. Ask your health care provider how often  you should have your eyes checked. Personal lifestyle choices, including: Daily care of your teeth and gums. Regular physical activity. Eating a healthy diet. Avoiding tobacco and drug use. Limiting alcohol use. Practicing safe sex. Taking low-dose aspirin daily starting at age 10. Taking vitamin and mineral supplements as recommended by your health care provider. What happens during an annual well check? The services and screenings done by your health care provider during your annual well check will depend on your age, overall health, lifestyle risk factors, and family history of disease. Counseling  Your health care provider may ask you questions about your: Alcohol use. Tobacco use. Drug use. Emotional well-being. Home and relationship well-being. Sexual activity. Eating habits. Work and work Astronomer. Method of birth control. Menstrual cycle. Pregnancy history. Screening  You may have the following tests or measurements: Height, weight, and BMI. Blood pressure. Lipid and cholesterol levels. These may be checked every 5 years, or more frequently if you are over 58 years old. Skin check. Lung cancer screening. You may have this screening every year starting at age 67 if you have a 30-pack-year history of smoking and currently smoke or have quit within the past 15 years. Fecal occult blood test (FOBT) of the stool. You may have this test every year starting at age 45. Flexible sigmoidoscopy or colonoscopy. You may have a sigmoidoscopy every  5 years or a colonoscopy every 10 years starting at age 25. Hepatitis C blood test. Hepatitis B blood test. Sexually transmitted disease (STD) testing. Diabetes screening. This is done by checking your blood sugar (glucose) after you have not eaten for a while (fasting). You may have this done every 1-3 years. Mammogram. This may be done every 1-2 years. Talk to your health care provider about when you should start having regular  mammograms. This may depend on whether you have a family history of breast cancer. BRCA-related cancer screening. This may be done if you have a family history of breast, ovarian, tubal, or peritoneal cancers. Pelvic exam and Pap test. This may be done every 3 years starting at age 23. Starting at age 56, this may be done every 5 years if you have a Pap test in combination with an HPV test. Bone density scan. This is done to screen for osteoporosis. You may have this scan if you are at high risk for osteoporosis. Discuss your test results, treatment options, and if necessary, the need for more tests with your health care provider. Vaccines  Your health care provider may recommend certain vaccines, such as: Influenza vaccine. This is recommended every year. Tetanus, diphtheria, and acellular pertussis (Tdap, Td) vaccine. You may need a Td booster every 10 years. Zoster vaccine. You may need this after age 46. Pneumococcal 13-valent conjugate (PCV13) vaccine. You may need this if you have certain conditions and were not previously vaccinated. Pneumococcal polysaccharide (PPSV23) vaccine. You may need one or two doses if you smoke cigarettes or if you have certain conditions. Talk to your health care provider about which screenings and vaccines you need and how often you need them. This information is not intended to replace advice given to you by your health care provider. Make sure you discuss any questions you have with your health care provider. Document Released: 06/30/2015 Document Revised: 02/21/2016 Document Reviewed: 04/04/2015 Elsevier Interactive Patient Education  2017 [employer].    Fall Prevention in the Home Falls can cause injuries. They can happen to people of all ages. There are many things you can do to make your home safe and to help prevent falls. What can I do on the outside of my home? Regularly fix the edges of walkways and driveways and fix any cracks. Remove anything  that might make you trip as you walk through a door, such as a raised step or threshold. Trim any bushes or trees on the path to your home. Use bright outdoor lighting. Clear any walking paths of anything that might make someone trip, such as rocks or tools. Regularly check to see if handrails are loose or broken. Make sure that both sides of any steps have handrails. Any raised decks and porches should have guardrails on the edges. Have any leaves, snow, or ice cleared regularly. Use sand or salt on walking paths during winter. Clean up any spills in your garage right away. This includes oil or grease spills. What can I do in the bathroom? Use night lights. Install grab bars by the toilet and in the tub and shower. Do not use towel bars as grab bars. Use non-skid mats or decals in the tub or shower. If you need to sit down in the shower, use a plastic, non-slip stool. Keep the floor dry. Clean up any water that spills on the floor as soon as it happens. Remove soap buildup in the tub or shower regularly. Attach bath mats securely with  double-sided non-slip rug tape. Do not have throw rugs and other things on the floor that can make you trip. What can I do in the bedroom? Use night lights. Make sure that you have a light by your bed that is easy to reach. Do not use any sheets or blankets that are too big for your bed. They should not hang down onto the floor. Have a firm chair that has side arms. You can use this for support while you get dressed. Do not have throw rugs and other things on the floor that can make you trip. What can I do in the kitchen? Clean up any spills right away. Avoid walking on wet floors. Keep items that you use a lot in easy-to-reach places. If you need to reach something above you, use a strong step stool that has a grab bar. Keep electrical cords out of the way. Do not use floor polish or wax that makes floors slippery. If you must use wax, use non-skid floor  wax. Do not have throw rugs and other things on the floor that can make you trip. What can I do with my stairs? Do not leave any items on the stairs. Make sure that there are handrails on both sides of the stairs and use them. Fix handrails that are broken or loose. Make sure that handrails are as long as the stairways. Check any carpeting to make sure that it is firmly attached to the stairs. Fix any carpet that is loose or worn. Avoid having throw rugs at the top or bottom of the stairs. If you do have throw rugs, attach them to the floor with carpet tape. Make sure that you have a light switch at the top of the stairs and the bottom of the stairs. If you do not have them, ask someone to add them for you. What else can I do to help prevent falls? Wear shoes that: Do not have high heels. Have rubber bottoms. Are comfortable and fit you well. Are closed at the toe. Do not wear sandals. If you use a stepladder: Make sure that it is fully opened. Do not climb a closed stepladder. Make sure that both sides of the stepladder are locked into place. Ask someone to hold it for you, if possible. Clearly mark and make sure that you can see: Any grab bars or handrails. First and last steps. Where the edge of each step is. Use tools that help you move around (mobility aids) if they are needed. These include: Canes. Walkers. Scooters. Crutches. Turn on the lights when you go into a dark area. Replace any light bulbs as soon as they burn out. Set up your furniture so you have a clear path. Avoid moving your furniture around. If any of your floors are uneven, fix them. If there are any pets around you, be aware of where they are. Review your medicines with your doctor. Some medicines can make you feel dizzy. This can increase your chance of falling. Ask your doctor what other things that you can do to help prevent falls. This information is not intended to replace advice given to you by your  health care provider. Make sure you discuss any questions you have with your health care provider. Document Released: 03/30/2009 Document Revised: 11/09/2015 Document Reviewed: 07/08/2014 Elsevier Interactive Patient Education  2017 [employer].

## 2022-10-22 ENCOUNTER — Other Ambulatory Visit (HOSPITAL_COMMUNITY): Payer: Self-pay | Admitting: Surgery

## 2022-10-22 DIAGNOSIS — Z85048 Personal history of other malignant neoplasm of rectum, rectosigmoid junction, and anus: Secondary | ICD-10-CM

## 2022-10-25 ENCOUNTER — Other Ambulatory Visit: Payer: Self-pay | Admitting: Family Medicine

## 2022-10-25 DIAGNOSIS — E119 Type 2 diabetes mellitus without complications: Secondary | ICD-10-CM

## 2022-11-08 ENCOUNTER — Ambulatory Visit
Admission: RE | Admit: 2022-11-08 | Discharge: 2022-11-08 | Disposition: A | Discharge status: Home / Self Care | Payer: 59 | Source: Ambulatory Visit | Attending: Family Medicine | Admitting: Family Medicine

## 2022-11-08 DIAGNOSIS — Z1231 Encounter for screening mammogram for malignant neoplasm of breast: Secondary | ICD-10-CM | POA: Diagnosis not present

## 2022-11-15 ENCOUNTER — Ambulatory Visit (HOSPITAL_COMMUNITY)
Admission: RE | Admit: 2022-11-15 | Discharge: 2022-11-15 | Disposition: A | Discharge status: Home / Self Care | Payer: 59 | Source: Ambulatory Visit | Attending: Surgery | Admitting: Surgery

## 2022-11-15 DIAGNOSIS — Z85048 Personal history of other malignant neoplasm of rectum, rectosigmoid junction, and anus: Secondary | ICD-10-CM | POA: Diagnosis not present

## 2022-11-15 DIAGNOSIS — K760 Fatty (change of) liver, not elsewhere classified: Secondary | ICD-10-CM | POA: Diagnosis not present

## 2022-11-15 DIAGNOSIS — E042 Nontoxic multinodular goiter: Secondary | ICD-10-CM | POA: Diagnosis not present

## 2022-11-15 DIAGNOSIS — I700 Atherosclerosis of aorta: Secondary | ICD-10-CM | POA: Insufficient documentation

## 2022-11-15 DIAGNOSIS — R911 Solitary pulmonary nodule: Secondary | ICD-10-CM | POA: Insufficient documentation

## 2022-11-15 DIAGNOSIS — K3189 Other diseases of stomach and duodenum: Secondary | ICD-10-CM | POA: Diagnosis not present

## 2022-11-15 MED ORDER — IOHEXOL 350 MG/ML SOLN
75.0000 mL | Freq: Once | INTRAVENOUS | Status: AC | PRN
  Administered 2022-11-15: 75 mL via INTRAVENOUS

## 2022-11-20 ENCOUNTER — Other Ambulatory Visit: Payer: Self-pay | Admitting: Family Medicine

## 2022-12-23 ENCOUNTER — Other Ambulatory Visit: Payer: Self-pay | Admitting: Family Medicine

## 2022-12-23 DIAGNOSIS — K219 Gastro-esophageal reflux disease without esophagitis: Secondary | ICD-10-CM

## 2022-12-23 DIAGNOSIS — I10 Essential (primary) hypertension: Secondary | ICD-10-CM

## 2022-12-31 DIAGNOSIS — Z85048 Personal history of other malignant neoplasm of rectum, rectosigmoid junction, and anus: Secondary | ICD-10-CM | POA: Diagnosis not present

## 2023-01-02 NOTE — Progress Notes (Signed)
  SUBJECTIVE:   CHIEF COMPLAINT / HPI:   T2DM -Current medication regimen: Metformin 1000 mg twice daily -Last A1c: 7.1 in 05/2022. 6.2 today in clinic -Denies polyuria, polydipsia, abdominal pain, chest pain, shortness of breath -Planning to see eye dr soon for exam  Agrees to shingles vaccine   PERTINENT  PMH / PSH:   Past Medical History:  Diagnosis Date   Adenocarcinoma of colon (HCC) 09/23/2016   Allergy    uses Nasocort daily and takes Singulair nightly    Anemia    Arthritis    Chronic back pain    buldging disc   Diabetes mellitus without complication (HCC)    diet controlled   DJD (degenerative joint disease)    whole spine   GERD (gastroesophageal reflux disease)    takes Omeprazole daily    History of bronchitis    many yrs ago   Hyperlipidemia    takes Atorvastatin daily   Hypertension    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 09/18/2016   Weakness    left arm resolved now    OBJECTIVE:  BP 120/60   Pulse 74   Ht 5\' 7"  (1.702 m)   Wt 154 lb (69.9 kg)   SpO2 95%   BMI 24.12 kg/m   General: NAD, pleasant, able to participate in exam Cardiac: RRR, no murmurs auscultated Respiratory: CTAB, normal WOB Abdomen: soft, non-tender, non-distended, normoactive bowel sounds Extremities: warm and well perfused, no edema or cyanosis Skin: warm and dry, no rashes noted Neuro: alert, no obvious focal deficits, speech normal Psych: Normal affect and mood  Foot exam completed today: no wounds or skin breakdown, normal sensation to sharp and soft, normal proprioception  ASSESSMENT/PLAN:   Controlled type 2 diabetes mellitus without complication, without long-term current use of insulin (HCC) Assessment & Plan: A1c 6.2 today from 7.1 at last visit. Well controlled on current regimen. Continue metformin 1000mg  BID. Check UACR and BMP today. F/u 32mo  Orders: -     POCT glycosylated hemoglobin (Hb A1C) -     Basic metabolic panel -      Microalbumin / creatinine urine ratio  Need for shingles vaccine -     Shingrix; Administer Shingrix vaccination now and repeat in two months  Dispense: 1 each; Refill: 1   Meds ordered this encounter  Medications   Zoster Vaccine Adjuvanted [facility]) injection    Sig: Administer Shingrix vaccination now and repeat in two months    Dispense:  1 each    Refill:  1   Return in about 6 months (around 07/06/2023).  Vonna Drafts, MD [facility]

## 2023-01-03 ENCOUNTER — Ambulatory Visit (INDEPENDENT_AMBULATORY_CARE_PROVIDER_SITE_OTHER): Payer: 59 | Admitting: Family Medicine

## 2023-01-03 ENCOUNTER — Encounter: Payer: Self-pay | Admitting: Family Medicine

## 2023-01-03 VITALS — BP 120/60 | HR 74 | Ht 67.0 in | Wt 154.0 lb

## 2023-01-03 DIAGNOSIS — Z23 Encounter for immunization: Secondary | ICD-10-CM | POA: Diagnosis not present

## 2023-01-03 DIAGNOSIS — E119 Type 2 diabetes mellitus without complications: Secondary | ICD-10-CM | POA: Diagnosis not present

## 2023-01-03 LAB — POCT GLYCOSYLATED HEMOGLOBIN (HGB A1C): HbA1c, POC (controlled diabetic range): 6.2 % (ref 0.0–7.0)

## 2023-01-03 MED ORDER — SHINGRIX 50 MCG/0.5ML IM SUSR
INTRAMUSCULAR | 1 refills | Status: DC
Start: 2023-01-03 — End: 2023-07-17

## 2023-01-03 NOTE — Assessment & Plan Note (Addendum)
A1c 6.2 today from 7.1 at last visit. Well controlled on current regimen. Continue metformin 1000mg  BID. Well controlled on ARB and gabapentin. Foot exam normal today. Check UACR and BMP today. F/u 7mo

## 2023-01-03 NOTE — Patient Instructions (Signed)
Your A1c is 6.2 - keep up the great work! I will let you know if any of your labs are abnormal from today.  Please let us know if you have any questions.  Vonna Drafts, MD

## 2023-01-04 LAB — BASIC METABOLIC PANEL
BUN/Creatinine Ratio: 17 (ref 12–28)
BUN: 10 mg/dL (ref 8–27)
CO2: 22 mmol/L (ref 20–29)
Calcium: 9.7 mg/dL (ref 8.7–10.3)
Chloride: 102 mmol/L (ref 96–106)
Creatinine, Ser: 0.6 mg/dL (ref 0.57–1.00)
Glucose: 152 mg/dL — ABNORMAL HIGH (ref 70–99)
Potassium: 4.1 mmol/L (ref 3.5–5.2)
Sodium: 141 mmol/L (ref 134–144)
eGFR: 101 mL/min/{1.73_m2} (ref >59)

## 2023-01-04 LAB — MICROALBUMIN / CREATININE URINE RATIO
Creatinine, Urine: 59.6 mg/dL
Microalb/Creat Ratio: 34 mg/g creat — ABNORMAL HIGH (ref 0–29)
Microalbumin, Urine: 20.4 ug/mL

## 2023-01-07 ENCOUNTER — Encounter: Payer: Self-pay | Admitting: Family Medicine

## 2023-01-23 ENCOUNTER — Other Ambulatory Visit: Payer: Self-pay | Admitting: Family Medicine

## 2023-01-23 DIAGNOSIS — E119 Type 2 diabetes mellitus without complications: Secondary | ICD-10-CM

## 2023-03-10 ENCOUNTER — Other Ambulatory Visit: Payer: Self-pay | Admitting: Family Medicine

## 2023-03-26 ENCOUNTER — Other Ambulatory Visit: Payer: Self-pay | Admitting: Family Medicine

## 2023-03-26 DIAGNOSIS — I10 Essential (primary) hypertension: Secondary | ICD-10-CM

## 2023-04-23 ENCOUNTER — Other Ambulatory Visit: Payer: Self-pay | Admitting: Family Medicine

## 2023-04-23 DIAGNOSIS — E119 Type 2 diabetes mellitus without complications: Secondary | ICD-10-CM

## 2023-04-24 ENCOUNTER — Telehealth: Payer: Self-pay | Admitting: Pharmacist

## 2023-04-24 NOTE — Telephone Encounter (Signed)
Openned in error

## 2023-04-25 ENCOUNTER — Ambulatory Visit (INDEPENDENT_AMBULATORY_CARE_PROVIDER_SITE_OTHER): Payer: 59

## 2023-04-25 DIAGNOSIS — Z23 Encounter for immunization: Secondary | ICD-10-CM | POA: Diagnosis not present

## 2023-04-25 NOTE — Progress Notes (Signed)
Patient present to nurse clinic for Flu and Covid vaccines.  Vaccines administered with out complication.  See admin for details.

## 2023-05-02 ENCOUNTER — Other Ambulatory Visit: Payer: Self-pay | Admitting: Family Medicine

## 2023-05-02 DIAGNOSIS — E119 Type 2 diabetes mellitus without complications: Secondary | ICD-10-CM

## 2023-05-12 ENCOUNTER — Telehealth: Payer: Self-pay

## 2023-05-12 NOTE — Patient Outreach (Signed)
Attempted to contact patient regarding care gaps. Left voicemail for patient to return my call at [phone].  Nicholes Rough, CMA [facility] [employer]

## 2023-06-07 ENCOUNTER — Other Ambulatory Visit: Payer: Self-pay | Admitting: Family Medicine

## 2023-07-09 ENCOUNTER — Other Ambulatory Visit: Payer: Self-pay | Admitting: Family Medicine

## 2023-07-09 DIAGNOSIS — E119 Type 2 diabetes mellitus without complications: Secondary | ICD-10-CM

## 2023-07-09 DIAGNOSIS — I10 Essential (primary) hypertension: Secondary | ICD-10-CM

## 2023-07-11 ENCOUNTER — Other Ambulatory Visit: Payer: Self-pay | Admitting: Family Medicine

## 2023-07-11 DIAGNOSIS — I10 Essential (primary) hypertension: Secondary | ICD-10-CM

## 2023-07-17 ENCOUNTER — Ambulatory Visit: Payer: 59 | Admitting: Family Medicine

## 2023-07-17 VITALS — BP 134/68 | HR 67 | Wt 143.0 lb

## 2023-07-17 DIAGNOSIS — E119 Type 2 diabetes mellitus without complications: Secondary | ICD-10-CM | POA: Diagnosis not present

## 2023-07-17 DIAGNOSIS — I10 Essential (primary) hypertension: Secondary | ICD-10-CM | POA: Diagnosis not present

## 2023-07-17 DIAGNOSIS — Z23 Encounter for immunization: Secondary | ICD-10-CM

## 2023-07-17 LAB — POCT GLYCOSYLATED HEMOGLOBIN (HGB A1C): HbA1c, POC (controlled diabetic range): 5.8 % (ref 0.0–7.0)

## 2023-07-17 MED ORDER — SHINGRIX 50 MCG/0.5ML IM SUSR: INTRAMUSCULAR | 1 refills | Status: DC

## 2023-07-17 NOTE — Progress Notes (Signed)
    SUBJECTIVE:   CHIEF COMPLAINT / HPI:   T2DM -Current medication regimen: Metformin 1000 mg twice daily -Denies polyuria, polydipsia, abdominal pain, chest pain, shortness of breath -Foot exam: done 12/2022, normal -Eye exam: planning to schedule  Lab Results  Component Value Date   HGBA1C 5.8 07/17/2023   HGBA1C 6.2 01/03/2023   HGBA1C 7.1 (A) 05/27/2022   Hypertension on losartan 25 mg daily, Lopressor 50 mg daily Denies concerns on this regimen Denies any severe headaches, vision changes, chest pain, shortness of breath  PERTINENT  PMH / PSH: DM, hypertension  OBJECTIVE:   BP 134/68   Pulse 67   Wt 143 lb (64.9 kg)   SpO2 98%   BMI 22.40 kg/m   General: NAD, pleasant, able to participate in exam Cardiac: RRR, no murmurs auscultated Respiratory: CTAB, normal WOB Abdomen: soft, non-tender, non-distended, normoactive bowel sounds Extremities: warm and well perfused, no edema or cyanosis Skin: warm and dry, no rashes noted Neuro: alert, no obvious focal deficits, speech normal Psych: Normal affect and mood  ASSESSMENT/PLAN:   Assessment & Plan Controlled type 2 diabetes mellitus without complication, without long-term current use of insulin (HCC) Stable, A1c 5.8 today from 6.2 6  months ago.  Continue current regimen of metformin 1000 mg twice daily, follow-up in 6 months HYPERTENSION, BENIGN ESSENTIAL Stable, well-controlled on current regimen of losartan 25 mg daily, Lopressor 50 mg daily.  Continue this regimen  Provided shingles vaccine prescription Pneumonia and Tdap done today  Vonna Drafts, MD [facility]

## 2023-07-17 NOTE — Addendum Note (Signed)
Addended by: Georges Lynch T on: 07/17/2023 11:37 AM   Modules accepted: Orders

## 2023-07-17 NOTE — Assessment & Plan Note (Signed)
Stable, well-controlled on current regimen of losartan 25 mg daily, Lopressor 50 mg daily.  Continue this regimen

## 2023-07-17 NOTE — Assessment & Plan Note (Signed)
Stable, A1c 5.8 today from 6.2 6  months ago.  Continue current regimen of metformin 1000 mg twice daily, follow-up in 6 months

## 2023-07-17 NOTE — Patient Instructions (Signed)
Please continue all of your current medications and follow-up in about 6 months

## 2023-07-29 ENCOUNTER — Other Ambulatory Visit: Payer: Self-pay

## 2023-07-29 DIAGNOSIS — E119 Type 2 diabetes mellitus without complications: Secondary | ICD-10-CM

## 2023-07-30 MED ORDER — LOSARTAN POTASSIUM 25 MG PO TABS: 25.0000 mg | ORAL_TABLET | Freq: Every day | ORAL | 0 refills | Status: DC

## 2023-08-08 ENCOUNTER — Other Ambulatory Visit: Payer: Self-pay | Admitting: Family Medicine

## 2023-09-03 ENCOUNTER — Other Ambulatory Visit: Payer: Self-pay | Admitting: Family Medicine

## 2023-09-03 DIAGNOSIS — K219 Gastro-esophageal reflux disease without esophagitis: Secondary | ICD-10-CM

## 2023-10-02 ENCOUNTER — Ambulatory Visit: Admitting: Student

## 2023-10-02 ENCOUNTER — Encounter (HOSPITAL_COMMUNITY): Payer: Self-pay | Admitting: *Deleted

## 2023-10-02 ENCOUNTER — Encounter: Payer: Self-pay | Admitting: Student

## 2023-10-02 ENCOUNTER — Emergency Department (HOSPITAL_COMMUNITY)

## 2023-10-02 ENCOUNTER — Other Ambulatory Visit: Payer: Self-pay

## 2023-10-02 ENCOUNTER — Emergency Department (HOSPITAL_COMMUNITY)
Admission: EM | Admit: 2023-10-02 | Discharge: 2023-10-02 | Disposition: A | Discharge status: Home / Self Care | Attending: Emergency Medicine | Admitting: Emergency Medicine

## 2023-10-02 VITALS — BP 124/68 | HR 95 | Ht 67.0 in | Wt 144.5 lb

## 2023-10-02 DIAGNOSIS — M25552 Pain in left hip: Secondary | ICD-10-CM | POA: Insufficient documentation

## 2023-10-02 DIAGNOSIS — I10 Essential (primary) hypertension: Secondary | ICD-10-CM | POA: Insufficient documentation

## 2023-10-02 DIAGNOSIS — Z79899 Other long term (current) drug therapy: Secondary | ICD-10-CM | POA: Diagnosis not present

## 2023-10-02 DIAGNOSIS — M1612 Unilateral primary osteoarthritis, left hip: Secondary | ICD-10-CM | POA: Diagnosis not present

## 2023-10-02 DIAGNOSIS — W19XXXA Unspecified fall, initial encounter: Secondary | ICD-10-CM | POA: Insufficient documentation

## 2023-10-02 DIAGNOSIS — Z85038 Personal history of other malignant neoplasm of large intestine: Secondary | ICD-10-CM | POA: Insufficient documentation

## 2023-10-02 DIAGNOSIS — C20 Malignant neoplasm of rectum: Secondary | ICD-10-CM | POA: Diagnosis not present

## 2023-10-02 DIAGNOSIS — S79912A Unspecified injury of left hip, initial encounter: Secondary | ICD-10-CM | POA: Diagnosis not present

## 2023-10-02 DIAGNOSIS — M79605 Pain in left leg: Secondary | ICD-10-CM | POA: Diagnosis not present

## 2023-10-02 DIAGNOSIS — M160 Bilateral primary osteoarthritis of hip: Secondary | ICD-10-CM | POA: Diagnosis not present

## 2023-10-02 DIAGNOSIS — E119 Type 2 diabetes mellitus without complications: Secondary | ICD-10-CM | POA: Insufficient documentation

## 2023-10-02 DIAGNOSIS — Z7984 Long term (current) use of oral hypoglycemic drugs: Secondary | ICD-10-CM | POA: Insufficient documentation

## 2023-10-02 LAB — CBC WITH DIFFERENTIAL/PLATELET
Abs Immature Granulocytes: 0.03 10*3/uL (ref 0.00–0.07)
Basophils Absolute: 0 10*3/uL (ref 0.0–0.1)
Basophils Relative: 0 %
Eosinophils Absolute: 0.1 10*3/uL (ref 0.0–0.5)
Eosinophils Relative: 1 %
HCT: 40.7 % (ref 36.0–46.0)
Hemoglobin: 13.3 g/dL (ref 12.0–15.0)
Immature Granulocytes: 0 %
Lymphocytes Relative: 29 %
Lymphs Abs: 2.8 10*3/uL (ref 0.7–4.0)
MCH: 27.9 pg (ref 26.0–34.0)
MCHC: 32.7 g/dL (ref 30.0–36.0)
MCV: 85.3 fL (ref 80.0–100.0)
Monocytes Absolute: 0.8 10*3/uL (ref 0.1–1.0)
Monocytes Relative: 9 %
Neutro Abs: 5.9 10*3/uL (ref 1.7–7.7)
Neutrophils Relative %: 61 %
Platelets: 247 10*3/uL (ref 150–400)
RBC: 4.77 MIL/uL (ref 3.87–5.11)
RDW: 13.9 % (ref 11.5–15.5)
WBC: 9.6 10*3/uL (ref 4.0–10.5)
nRBC: 0 % (ref 0.0–0.2)

## 2023-10-02 LAB — COMPREHENSIVE METABOLIC PANEL WITH GFR
ALT: 20 U/L (ref 0–44)
AST: 17 U/L (ref 15–41)
Albumin: 3.8 g/dL (ref 3.5–5.0)
Alkaline Phosphatase: 62 U/L (ref 38–126)
Anion gap: 13 (ref 5–15)
BUN: 12 mg/dL (ref 8–23)
CO2: 24 mmol/L (ref 22–32)
Calcium: 9.7 mg/dL (ref 8.9–10.3)
Chloride: 103 mmol/L (ref 98–111)
Creatinine, Ser: 0.63 mg/dL (ref 0.44–1.00)
GFR, Estimated: >60 mL/min (ref >60)
Glucose, Bld: 110 mg/dL — ABNORMAL HIGH (ref 70–99)
Potassium: 3.8 mmol/L (ref 3.5–5.1)
Sodium: 140 mmol/L (ref 135–145)
Total Bilirubin: 1.1 mg/dL (ref 0.0–1.2)
Total Protein: 7.7 g/dL (ref 6.5–8.1)

## 2023-10-02 MED ORDER — OXYCODONE HCL 5 MG PO TABS
5.0000 mg | ORAL_TABLET | Freq: Once | ORAL | Status: AC
  Administered 2023-10-02: 5 mg via ORAL
  Filled 2023-10-02: qty 1

## 2023-10-02 NOTE — ED Provider Notes (Signed)
 [facility] Provider Note   CSN: [id number] Arrival date & time: 10/02/23  1211     History  Chief Complaint  Patient presents with   Hip Pain    Wanda Baker is a 63 y.o. female with history of diabetes, hypertension, hyperlipidemia presents following a fall on her left hip.  Patient states she fell last night after she lost balance.  She did not hit her head or lose consciousness.  She has had difficulty ambulating since the fall, describes pain even at rest.  No numbness or tingling in her lower extremity.  Was referred here by her PCP.  States that she is typically able to walk around without any difficulty.   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 Authorizing Provider  acetaminophen  (TYLENOL ) 650 MG CR tablet Take 1,300 mg by mouth every 8 (eight) hours as needed for pain.    [provider]  atorvastatin  (LIPITOR) 40 MG tablet Take 1 tablet (40 mg total) by mouth daily. 05/27/22   Edison Gore, MD  cetirizine  (ZYRTEC ) 10 MG tablet Take 1 tablet (10 mg total) by mouth at bedtime. 11/17/17   Mikell, Gillermo Lack, MD  diclofenac  Sodium (VOLTAREN ) 1 % GEL Apply 2 g topically 4 (four) times daily. 09/07/20   Jovita Nipper, MD  famotidine   (PEPCID ) 20 MG tablet TAKE 1 TABLET(20 MG) BY MOUTH TWICE DAILY 09/03/23   Edison Gore, MD  gabapentin  (NEURONTIN ) 400 MG capsule TAKE 1 CAPSULE BY MOUTH THREE TIMES DAILY AS NEEDED 08/11/23   Edison Gore, MD  losartan  (COZAAR ) 25 MG tablet Take 1 tablet (25 mg total) by mouth daily. 07/30/23   Edison Gore, MD  metFORMIN  (GLUCOPHAGE ) 1000 MG tablet TAKE 1 TABLET(1000 MG) BY MOUTH TWICE DAILY WITH A MEAL 07/09/23   Edison Gore, MD  metoprolol  tartrate (LOPRESSOR ) 50 MG tablet TAKE 2 TABLETS(100 MG) BY MOUTH DAILY 07/09/23   Edison Gore, MD  Multiple Vitamins-Minerals (ALIVE ONCE DAILY WOMENS 50+ PO) Take 1 tablet by mouth daily.     [provider]  psyllium (METAMUCIL) 58.6 % packet Take 1 packet by mouth daily.    [provider]  saccharomyces boulardii (FLORASTOR) 250 MG capsule Take 1 capsule (250 mg total) by mouth 2 (two) times daily. 08/14/17   Kraig Peru, MD  Zoster Vaccine Adjuvanted (SHINGRIX ) injection Administer Shingrix  vaccination now and repeat in two months 07/17/23   Edison Gore, MD      Allergies    Hydrocodone , Nsaids, Penicillins, and Prednisone    Review of Systems   Review of Systems  Musculoskeletal:  Positive for arthralgias.  Physical Exam Updated Vital Signs BP 135/73 (BP Location: Right Arm)   Pulse 99   Temp 98.5 F (36.9 C) (Oral)   Resp 16   SpO2 98%  Physical Exam Vitals and nursing note reviewed.  Constitutional:      General: She is not in acute distress.    Appearance: She is well-developed.  HENT:     Head: Normocephalic and atraumatic.  Eyes:     Conjunctiva/sclera: Conjunctivae normal.  Cardiovascular:     Rate and Rhythm: Normal rate and regular rhythm.     Heart sounds: No murmur heard. Pulmonary:     Effort: Pulmonary effort is normal. No respiratory distress.     Breath sounds: Normal breath sounds.  Abdominal:     Palpations: Abdomen is soft.     Tenderness: There is no abdominal tenderness.   Musculoskeletal:        General: No swelling.     Cervical back: Neck supple.     Comments: Generalized tenderness to left anterior hip, no significant swelling or ecchymosis appreciated, lacks full hip extension due to pain, does not tolerate range of motion of left lower extremity, no knee tenderness, compartments are soft, DP pulses symmetric  Skin:    General: Skin is warm and dry.     Capillary Refill: Capillary refill takes less than 2 seconds.  Neurological:     Mental Status: She is alert.  Psychiatric:        Mood and Affect: Mood normal.     ED Results / Procedures / Treatments   Labs (all labs ordered are listed, but only abnormal results are displayed) Labs Reviewed  COMPREHENSIVE METABOLIC PANEL WITH GFR - Abnormal; Notable for the following components:      Result Value   Glucose, Bld 110 (*)    All other components within normal limits  CBC WITH DIFFERENTIAL/PLATELET    EKG None  Radiology CT Hip Left Wo Contrast Result Date: 10/02/2023 CLINICAL DATA:  Left hip trauma. Fracture is suspected. No fracture was seen on today's x-rays. She has a prior history of rectal cancer with XRT. EXAM: CT OF THE LEFT HIP WITHOUT CONTRAST TECHNIQUE: Multidetector CT imaging of the left hip was performed according to the standard protocol. Multiplanar CT image reconstructions were also generated. RADIATION DOSE REDUCTION: This exam was performed according to the departmental dose-optimization program which includes automated exposure control, adjustment of the mA and/or kV according to patient size and/or use of iterative reconstruction technique. COMPARISON:  Chest, abdomen and pelvis CT with contrast and reconstructions 11/15/2022. AP pelvis and left hip dedicated views are reviewed from today. FINDINGS: Bones/Joint/Cartilage There is no evidence of left hip fracture or dislocation. The visualized left hemisacrum and left hemipelvis are intact. There is mild narrowing at the left hip  joint with acetabular spurring, trace spurring of the symphysis pubis, anterior spurring left SI joint with vacuum phenomenon. There are postradiation changes with fatty marrow replacement in the visualized sacrum. There is no joint effusion.  No focal pathologic osseous process. Ligaments Suboptimally assessed by CT. Muscles and Tendons No acute noncontrast CT findings. Soft tissues Evidence of prior low anterior resection, partially visible rectal colocolic surgical anastomosis. There are chronic post treatment changes in the pelvic fatty tissues above the level of the bladder. There is mild bladder thickening versus nondistention. Correlate clinically for cystitis. There is trace air in the anterior bladder which could be due to catheterization or fistula. There is no left pelvic fluid, free hemorrhage  or hematoma, mass or adenopathy. IMPRESSION: 1. No evidence of left hip fracture or dislocation. 2. Degenerative changes. 3. Postradiation changes in the sacrum. 4. Mild bladder thickening versus nondistention. Correlate clinically for cystitis. 5. Trace air in the anterior bladder which could be due to catheterization or fistula. 6. Evidence of prior low anterior resection, partially visible rectal colocolic surgical anastomosis. 7. Chronic post treatment changes in the pelvic fat above the bladder. Electronically Signed   By: Denman Fischer M.D.   On: 10/02/2023 22:43   DG Hip Unilat With Pelvis 2-3 Views Left Result Date: 10/02/2023 CLINICAL DATA:  Left hip pain after fall. EXAM: DG HIP (WITH OR WITHOUT PELVIS) 2-3V LEFT COMPARISON:  None Available. FINDINGS: There is no evidence of acute fracture or dislocation. Femoral heads are seated within the acetabula. Mild degenerative changes of the bilateral hips. Sacroiliac joints and pubic symphysis are anatomically aligned. IMPRESSION: No acute osseous abnormality. Electronically Signed   By: Mannie Seek M.D.   On: 10/02/2023 15:25     Procedures Procedures    Medications Ordered in ED Medications  oxyCODONE  (Oxy IR/ROXICODONE ) immediate release tablet 5 mg (5 mg Oral Given 10/02/23 2012)    ED Course/ Medical Decision Making/ A&P                                 Medical Decision Making Amount and/or Complexity of Data Reviewed Labs: ordered. Radiology: ordered.  Risk Prescription drug management.   This patient presents to the ED with chief complaint(s) of hip pain.  The complaint involves an extensive differential diagnosis and also carries with it a high risk of complications and morbidity.   pertinent past medical history as listed in HPI  The differential diagnosis includes  Fracture, dislocation, sprain, septic joint  Additional history obtained:  Records reviewed Care Everywhere/External Records  Initial Assessment:   Patient presents with left hip pain following a fall yesterday.  Reports difficulty ambulating.  On exam she has some anterior hip tenderness.  Pulses are symmetric, lacks full hip extension due to pain.  There is no overlying ecchymosis or swelling.  No overlying erythema or warmth.  No suspicion for septic joint.  Independent ECG interpretation:  none  Independent labs interpretation:  The following labs were independently interpreted:  CBC and CMP without significant abnormality  Independent visualization and interpretation of imaging: I independently visualized the following imaging with scope of interpretation limited to determining acute life threatening conditions related to emergency care: X-ray and CT hip, which revealed no acute abnormality  Treatment and Reassessment: Patient with oxycodone  following for assessment  Consultations obtained:   none  Disposition:   Patient be discharged home. The patient has been appropriately medically screened and/or stabilized in the ED. I have low suspicion for any other emergent medical condition which would require further  screening, evaluation or treatment in the ED or require inpatient management. At time of discharge the patient is hemodynamically stable and in no acute distress. I have discussed work-up results and diagnosis with patient and answered all questions. Patient is agreeable with discharge plan. We discussed strict return precautions for returning to the emergency department and they verbalized understanding.     Social Determinants of Health:   none  This note was dictated with voice recognition software.  Despite best efforts at proofreading, errors may have occurred which can change the documentation meaning.  Final Clinical Impression(s) / ED Diagnoses Final diagnoses:  Left hip pain    Rx / DC Orders ED Discharge Orders     None         Felicie Horning, PA-C 10/07/23 1412    Sallyanne Creamer, DO 10/10/23 2122

## 2023-10-02 NOTE — Patient Instructions (Addendum)
 It was great to see you! Thank you for allowing me to participate in your care!   Our plans for today:  - ED today for potential hip fracture and to get imaging quicker  Take care and seek immediate care sooner if you develop any concerns.  Genora Kidd, MD

## 2023-10-02 NOTE — Progress Notes (Addendum)
    SUBJECTIVE:   CHIEF COMPLAINT / HPI: Fall  Discussed the use of AI scribe software for clinical note transcription with the patient, who gave verbal consent to proceed.  History of Present Illness The patient presents after a fall yesterday in a parking lot. She tripped over an unknown object and fell onto her left side. She did not hit her head and was able to walk immediately after the fall. No LOC. Not on thinners. However, she developed pain in the left hip and groin area later that night. The pain is described as sore and is exacerbated by walking. She has not noticed any bruising. The patient uses a cane as needed but does not require a wheelchair. However here is having to use wheelchair. She has no history of fractures.  PERTINENT  PMH / PSH: HTN, GERD, rectal cancer, T2DM  OBJECTIVE:   BP 124/68   Pulse 95   Ht 5\' 7"  (1.702 m)   Wt 144 lb 8 oz (65.5 kg)   SpO2 100%   BMI 22.63 kg/m   General: Sitting in wheelchair, NAD, awake, alert, responsive to questions Head: Normocephalic atraumatic Respiratory:  chest rises symmetrically,  no increased work of breathing Extremities: LLE with tenderness to palpation of femoral head, no overlying bruising, unable to lift left leg up against gravity without shrap excrutiating pain, pain elicited with attempt of strength testing, ROM limited d/t pain, 2+ distal pulses, sensation intact  Chaperone Elonda Hale CMA for hip/groin exam   ASSESSMENT/PLAN:   Assessment & Plan Left leg pain Acute hip pain post-fall, tenderness over femoral head. Differential includes hip fracture v contusion. Poor ROM, high suspicion of fracture - Instruct to go to ER for XR and possible ortho evaluation (daughter in law will drive her) - Informed ED triage and FMTS  Genora Kidd, MD [facility]

## 2023-10-02 NOTE — ED Triage Notes (Signed)
 Pt is here for left hip pain.  She had a fall yesterday and has continued to have severe left groin pain, pt ambulatory to triage.  She states that family practice wanted her to have xray to r/o fx

## 2023-10-02 NOTE — ED Provider Triage Note (Signed)
 Emergency Medicine Provider Triage Evaluation Note  SAMARIE PINDER , a 63 y.o. female  was evaluated in triage.  Pt complains of fall, L hip pain. Fall yesterday, can ambulate, but worsening symptoms. Did not hit head, no LOC, no BT  Review of Systems  Positive: Left hip pain Negative: HA, neck pain  Physical Exam  BP 135/78   Pulse (!) 110   Temp 98.6 F (37 C)   Resp 16   SpO2 97%  Gen:   Awake, no distress   Resp:  Normal effort  MSK:   Moves extremities without difficulty  Other:    Medical Decision Making  Medically screening exam initiated at 1:10 PM.  Appropriate orders placed.  SANDIE SWAYZE was informed that the remainder of the evaluation will be completed by another provider, this initial triage assessment does not replace that evaluation, and the importance of remaining in the ED until their evaluation is complete.     Eudora Heron, PA-C 10/02/23 1311

## 2023-10-02 NOTE — Discharge Instructions (Signed)
 You were evaluated in the emergency room for left hip pain.  Your imaging did not show any acute abnormality.  If your symptoms persist please follow-up with orthopedics as provided.

## 2023-10-13 ENCOUNTER — Other Ambulatory Visit: Payer: Self-pay | Admitting: Family Medicine

## 2023-10-13 DIAGNOSIS — I10 Essential (primary) hypertension: Secondary | ICD-10-CM

## 2023-10-13 DIAGNOSIS — E119 Type 2 diabetes mellitus without complications: Secondary | ICD-10-CM

## 2023-10-31 ENCOUNTER — Other Ambulatory Visit: Payer: Self-pay | Admitting: Family Medicine

## 2023-12-15 ENCOUNTER — Other Ambulatory Visit: Payer: Self-pay | Admitting: Family Medicine

## 2023-12-15 DIAGNOSIS — Z1231 Encounter for screening mammogram for malignant neoplasm of breast: Secondary | ICD-10-CM

## 2023-12-29 ENCOUNTER — Other Ambulatory Visit: Payer: Self-pay | Admitting: Family Medicine

## 2023-12-29 DIAGNOSIS — I10 Essential (primary) hypertension: Secondary | ICD-10-CM

## 2023-12-29 DIAGNOSIS — E119 Type 2 diabetes mellitus without complications: Secondary | ICD-10-CM

## 2024-01-06 ENCOUNTER — Inpatient Hospital Stay
Admission: RE | Admit: 2024-01-06 | Discharge: 2024-01-06 | Discharge status: Home / Self Care | Source: Ambulatory Visit | Attending: Child & Adolescent Psychiatry | Admitting: Child & Adolescent Psychiatry

## 2024-01-06 DIAGNOSIS — Z1231 Encounter for screening mammogram for malignant neoplasm of breast: Secondary | ICD-10-CM | POA: Diagnosis not present

## 2024-01-30 ENCOUNTER — Ambulatory Visit (INDEPENDENT_AMBULATORY_CARE_PROVIDER_SITE_OTHER): Admitting: Family Medicine

## 2024-01-30 ENCOUNTER — Encounter: Payer: Self-pay | Admitting: Family Medicine

## 2024-01-30 VITALS — BP 127/70 | HR 73 | Ht 67.0 in | Wt 145.4 lb

## 2024-01-30 DIAGNOSIS — E119 Type 2 diabetes mellitus without complications: Secondary | ICD-10-CM

## 2024-01-30 DIAGNOSIS — I10 Essential (primary) hypertension: Secondary | ICD-10-CM | POA: Diagnosis not present

## 2024-01-30 LAB — POCT GLYCOSYLATED HEMOGLOBIN (HGB A1C): HbA1c, POC (controlled diabetic range): 5.8 % (ref 0.0–7.0)

## 2024-01-30 MED ORDER — METFORMIN HCL ER 500 MG PO TB24: 1000.0000 mg | ORAL_TABLET | Freq: Every day | ORAL | 3 refills | Status: AC

## 2024-01-30 NOTE — Progress Notes (Signed)
    SUBJECTIVE:   CHIEF COMPLAINT / HPI:   T2DM -Current medication regimen: Metformin  1000 mg twice daily.  She is on Lipitor 40 mg daily and losartan  25 mg daily as well.  Takes gabapentin  400 mg 3 times daily as needed. -Denies polyuria, polydipsia, abdominal pain, chest pain, shortness of breath -Foot exam: today -Eye exam: UTD  Lab Results  Component Value Date   HGBA1C 5.8 01/30/2024   HGBA1C 5.8 07/17/2023   HGBA1C 6.2 01/03/2023    HTN: On losartan  25mg  daily, lopressor  100mg  BID  PERTINENT  PMH / PSH: T2DM, HTN  OBJECTIVE:   BP 127/70   Pulse 73   Ht 5' 7 (1.702 m)   Wt 145 lb 6.4 oz (66 kg)   SpO2 99%   BMI 22.77 kg/m   General: NAD, pleasant, able to participate in exam Cardiac: RRR, no murmurs auscultated Respiratory: CTAB, normal WOB Abdomen: soft, non-tender, non-distended, normoactive bowel sounds Extremities: warm and well perfused, no edema or cyanosis Skin: warm and dry, no rashes noted Neuro: alert, no obvious focal deficits, speech normal Psych: Normal affect and mood  Feet: No notable skin breakdown or wounds.  Sensation intact to sharp and dull sensation in multiple locations on the toes, dorsum of foot, sole, and above the ankle.  Proprioception intact.  Able to bear weight.  ASSESSMENT/PLAN:   Assessment & Plan Controlled type 2 diabetes mellitus without complication, without long-term current use of insulin  (HCC) Well controlled A1c normal today, has been normal on past few checks Lost weight from last year, doing well from a lifestyle stand point Decrease metformin  to 1000mg  daily, f/u 25mo. Switched to XR formulation HYPERTENSION, BENIGN ESSENTIAL Stable, well controlled, continue current regimen   Payton Coward, MD [facility]

## 2024-01-30 NOTE — Patient Instructions (Addendum)
 Lets decrease your metformin dose: Take 1000 mg once a day instead of twice a day (this will be 2 500mg  tablets)  Follow-up in 3 months  I will let you know if any results from today require any treatment.  Otherwise you will get a letter in the mail  Be sure to see your eye doctor

## 2024-01-30 NOTE — Assessment & Plan Note (Addendum)
 Well controlled A1c normal today, has been normal on past few checks Lost weight from last year, doing well from a lifestyle stand point Decrease metformin  to 1000mg  daily, f/u 6mo. Switched to XR formulation

## 2024-01-30 NOTE — Assessment & Plan Note (Signed)
Stable, well-controlled, continue current regimen.

## 2024-01-31 LAB — MICROALBUMIN / CREATININE URINE RATIO
Creatinine, Urine: 101.7 mg/dL
Microalb/Creat Ratio: 37 mg/g{creat} — ABNORMAL HIGH (ref 0–29)
Microalbumin, Urine: 37.4 ug/mL

## 2024-02-02 ENCOUNTER — Other Ambulatory Visit: Payer: Self-pay | Admitting: Family Medicine

## 2024-02-04 ENCOUNTER — Ambulatory Visit: Payer: Self-pay | Admitting: Family Medicine

## 2024-03-29 ENCOUNTER — Other Ambulatory Visit: Payer: Self-pay | Admitting: Family Medicine

## 2024-03-29 ENCOUNTER — Ambulatory Visit

## 2024-03-29 VITALS — Ht 67.0 in | Wt 145.0 lb

## 2024-03-29 DIAGNOSIS — Z0000 Encounter for general adult medical examination without abnormal findings: Secondary | ICD-10-CM | POA: Diagnosis not present

## 2024-03-29 DIAGNOSIS — E119 Type 2 diabetes mellitus without complications: Secondary | ICD-10-CM

## 2024-03-29 DIAGNOSIS — I10 Essential (primary) hypertension: Secondary | ICD-10-CM

## 2024-03-29 NOTE — Progress Notes (Signed)
 Because this visit was a virtual/telehealth visit,  certain criteria was not obtained, such a blood pressure, CBG if applicable, and timed get up and go. Any medications not marked as taking were not mentioned during the medication reconciliation part of the visit. Any vitals not documented were not able to be obtained due to this being a telehealth visit or patient was unable to self-report a recent blood pressure reading due to a lack of equipment at home via telehealth. Vitals that have been documented are verbally provided by the patient.   Subjective:   Wanda Baker is a 63 y.o. who presents for a Medicare Wellness preventive visit.  As a reminder, Annual Wellness Visits don't include a physical exam, and some assessments may be limited, especially if this visit is performed virtually. We may recommend an in-person follow-up visit with your provider if needed.  Visit Complete: Virtual I connected with  Rock MARLA Brasil on 03/29/24 by a audio enabled telemedicine application and verified that I am speaking with the correct person using two identifiers.  Patient Location: Home  Provider Location: Home Office  I discussed the limitations of evaluation and management by telemedicine. The patient expressed understanding and agreed to proceed.  Vital Signs: Because this visit was a virtual/telehealth visit, some criteria may be missing or patient reported. Any vitals not documented were not able to be obtained and vitals that have been documented are patient reported.  VideoDeclined- This patient declined Librarian, academic. Therefore the visit was completed with audio only.  Persons Participating in Visit: Patient.  AWV Questionnaire: No: Patient Medicare AWV questionnaire was not completed prior to this visit.  Cardiac Risk Factors include: advanced age (>18men, >44 women);diabetes mellitus;hypertension;dyslipidemia;family history of premature cardiovascular  disease     Objective:    Today's Vitals   03/29/24 0957  Weight: 145 lb (65.8 kg)  Height: 5' 7 (1.702 m)  PainSc: 0-No pain   Body mass index is 22.71 kg/m.     03/29/2024   10:00 AM 10/02/2023   12:53 PM 10/02/2023   11:07 AM 01/03/2023   11:14 AM 10/21/2022    9:15 AM 06/19/2022    2:05 PM 04/24/2022   10:13 AM  Advanced Directives  Does Patient Have a Medical Advance Directive? No No No No No No No  Would patient like information on creating a medical advance directive? No - Patient declined  No - Patient declined No - Patient declined No - Patient declined No - Patient declined No - Patient declined    Current Medications (verified) Outpatient Encounter Medications as of 03/29/2024  Medication Sig   acetaminophen  (TYLENOL ) 650 MG CR tablet Take 1,300 mg by mouth every 8 (eight) hours as needed for pain.   atorvastatin  (LIPITOR) 40 MG tablet Take 1 tablet (40 mg total) by mouth daily.   cetirizine  (ZYRTEC ) 10 MG tablet Take 1 tablet (10 mg total) by mouth at bedtime.   diclofenac  Sodium (VOLTAREN ) 1 % GEL Apply 2 g topically 4 (four) times daily.   famotidine  (PEPCID ) 20 MG tablet TAKE 1 TABLET(20 MG) BY MOUTH TWICE DAILY   gabapentin  (NEURONTIN ) 400 MG capsule TAKE 1 CAPSULE BY MOUTH THREE TIMES DAILY AS NEEDED   losartan  (COZAAR ) 25 MG tablet TAKE 1 TABLET(25 MG) BY MOUTH DAILY   metFORMIN  (GLUCOPHAGE -XR) 500 MG 24 hr tablet Take 2 tablets (1,000 mg total) by mouth daily with breakfast.   metoprolol  tartrate (LOPRESSOR ) 50 MG tablet TAKE 2 TABLETS(100 MG) BY  MOUTH DAILY   Multiple Vitamins-Minerals (ALIVE ONCE DAILY WOMENS 50+ PO) Take 1 tablet by mouth daily.    psyllium (METAMUCIL) 58.6 % packet Take 1 packet by mouth daily.   saccharomyces boulardii (FLORASTOR) 250 MG capsule Take 1 capsule (250 mg total) by mouth 2 (two) times daily.   Zoster Vaccine Adjuvanted (SHINGRIX ) injection Administer Shingrix  vaccination now and repeat in two months   No facility-administered  encounter medications on file as of 03/29/2024.    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 09/18/2016   Weakness    left arm resolved now   Past Surgical History:  Procedure Laterality Date   ABDOMINAL HYSTERECTOMY  2011   Supracervical Hysterectomy complete   ANTERIOR CERVICAL DECOMP/DISCECTOMY FUSION N/A 12/02/2013   Procedure: ANTERIOR CERVICAL DECOMPRESSION/DISCECTOMY FUSION 3 LEVELS;  Surgeon: Fairy Levels, MD;  Location: [facility];  Service: Neurosurgery;  Laterality: N/A;  C4-5 C5-6 C6-7 Anterior cervical decompression/diskectomy/fusion   BOWEL RESECTION N/A 09/18/2016   Procedure: LOW ANTERIOR COLON RESECTION WITH COLOSTOMY;  Surgeon: Elon Pacini, MD;  Location: [facility];  Service: General;  Laterality: N/A;   BOWEL RESECTION  08/08/2017   Procedure: SMALL BOWEL RESECTION x 2;  Surgeon: Teresa Lonni HERO, MD;  Location: [facility];  Service: General;;   CESAREAN SECTION  86/88   COLONOSCOPY  06/04/2018   Dr.Nandigam   COLOSTOMY TAKEDOWN N/A 08/08/2017   Procedure: BENSON TAKEDOWN OF COLOSTOMY WITH COLORECTAL ANASTOMOIS ERAS PATHWAY;  Surgeon: Teresa Lonni HERO, MD;  Location: [facility];  Service: General;  Laterality: N/A;   CYSTOSCOPY W/ URETERAL STENT PLACEMENT N/A 08/08/2017   Procedure: CYSTOSCOPY WITH URETERAL CATHETERS;  Surgeon: Cam Morene ORN, MD;  Location:  [facility];  Service: Urology;  Laterality: N/A;   FLEXIBLE SIGMOIDOSCOPY N/A 09/17/2016   Procedure: FLEXIBLE SIGMOIDOSCOPY;  Surgeon: Gustav Shila GAILS, MD;  Location: [facility];  Service: Endoscopy;  Laterality: N/A;   FLEXIBLE SIGMOIDOSCOPY N/A 08/08/2017   Procedure: FLEXIBLE SIGMOIDOSCOPY;  Surgeon: Teresa Lonni HERO, MD;  Location: [facility];  Service: General;  Laterality: N/A;   FLEXIBLE SIGMOIDOSCOPY N/A 11/24/2017   Procedure: FLEXIBLE SIGMOIDOSCOPY;  Surgeon: Teresa Lonni HERO, MD;  Location: [facility];  Service: General;  Laterality: N/A;   ILEOSTOMY N/A 08/08/2017   Procedure: MARTON  ILEOSTOMY;  Surgeon: Teresa Lonni HERO, MD;  Location: [facility];  Service: General;  Laterality: N/A;   LAPAROSCOPY N/A 03/27/2018   Procedure: CLOSURE OF ILEOSTOMY;  Surgeon: Teresa Lonni HERO, MD;  Location: [facility];  Service: General;  Laterality: N/A;   LAPAROTOMY N/A 08/08/2017   Procedure: EXPLORATORY LAPAROTOMY;  Surgeon: Teresa Lonni HERO, MD;  Location: [facility];  Service: General;  Laterality: N/A;   LYSIS OF ADHESION  08/08/2017   Procedure: LYSIS OF ADHESION;  Surgeon: Teresa Lonni HERO, MD;  Location:  [facility];  Service: General;;   POLYPECTOMY     RECTAL EXAM UNDER ANESTHESIA  11/24/2017   Procedure: RECTAL EXAM UNDER ANESTHESIA;  Surgeon: Teresa Lonni HERO, MD;  Location: [facility];  Service: General;;   SIGMOIDOSCOPY     TUBAL LIGATION     Family History  Problem Relation Age of Onset   Heart disease Maternal Grandmother    Diabetes Maternal Grandmother    Hypertension Maternal Grandmother    Heart disease Maternal Grandfather    Heart disease Paternal Grandmother    Diabetes Paternal Grandmother    Alzheimer's disease Paternal Grandmother    Esophageal cancer Paternal Uncle    Colon cancer Neg Hx    Colon polyps Neg Hx    Rectal cancer Neg Hx    Stomach cancer Neg Hx    Social History   Socioeconomic History   Marital status: Divorced    Spouse  name: Not on file   Number of children: Not on file   Years of education: Not on file   Highest education level: Not on file  Occupational History   Not on file  Tobacco Use   Smoking status: Former    Current packs/day: 0.50    Average packs/day: 0.5 packs/day for 15.0 years (7.5 ttl pk-yrs)    Types: Cigarettes   Smokeless tobacco: Never   Tobacco comments:    quit smoking 2011  Vaping Use   Vaping status: Never Used  Substance and Sexual Activity   Alcohol use: No   Drug use: No   Sexual activity: Never    Birth control/protection: Surgical  Other Topics Concern   Not on file  Social History Narrative   lives with adult sons and mother and father   divorced   58 YO son has autism   Social Drivers of Corporate investment banker Strain: Low Risk  (03/29/2024)   Overall Financial Resource Strain (CARDIA)    Difficulty of Paying Living Expenses: Not hard at all  Food Insecurity: No Food Insecurity (03/29/2024)   Hunger Vital Sign    Worried About Running Out of Food in the Last Year: Never true    Ran Out of Food in the Last Year: Never true  Transportation Needs: No Transportation Needs (03/29/2024)   PRAPARE - Administrator, Civil Service (Medical): No    Lack of Transportation (Non-Medical): No  Physical Activity: Sufficiently Active (03/29/2024)   Exercise Vital Sign    Days of Exercise per Week: 5 days    Minutes of Exercise per Session: 30 min  Stress: No Stress Concern Present (03/29/2024)   [employer] of Occupational Health - Occupational Stress Questionnaire    Feeling of Stress: Not at all  Social Connections: Socially Isolated (03/29/2024)   Social Connection and Isolation Panel    Frequency of Communication with Friends and Family: More than three times a week    Frequency of Social Gatherings with Friends and Family: Three times a week    Attends Religious Services: Never    Active Member of Clubs or Organizations: No    Attends  Banker Meetings: Never    Marital Status: Divorced    Tobacco Counseling Counseling given: Not Answered Tobacco comments: quit smoking 2011    Clinical Intake:  Pre-visit preparation completed: Yes  Pain : No/denies pain Pain Score: 0-No pain     BMI - recorded: 22.71 Nutritional Status: BMI of 19-24  Normal Nutritional Risks: None Diabetes: No CBG  done?: No Did pt. bring in CBG monitor from home?: No  Lab Results  Component Value Date   HGBA1C 5.8 01/30/2024   HGBA1C 5.8 07/17/2023   HGBA1C 6.2 01/03/2023     How often do you need to have someone help you when you read instructions, pamphlets, or other written materials from your doctor or pharmacy?: 1 - Never  Interpreter Needed?: No  Information entered by :: Keshun Berrett N. Elyanah Farino, LPN.   Activities of Daily Living     03/29/2024   10:01 AM  In your present state of health, do you have any difficulty performing the following activities:  Hearing? 0  Vision? 0  Difficulty concentrating or making decisions? 0  Walking or climbing stairs? 0  Dressing or bathing? 0  Doing errands, shopping? 0  Preparing Food and eating ? N  Using the Toilet? N  In the past six months, have you accidently leaked urine? N  Do you have problems with loss of bowel control? N  Managing your Medications? N  Managing your Finances? N  Housekeeping or managing your Housekeeping? N    Patient Care Team: Romelle Booty, MD as PCP - General (Family Medicine) Teresa Lonni HERO, MD as Consulting Physician (General Surgery) Gail Favorite, MD as Consulting Physician (General Surgery) Nandigam, Kavitha V, MD as Consulting Physician (Gastroenterology)  I have updated your Care Teams any recent Medical Services you may have received from other providers in the past year.     Assessment:   This is a routine wellness examination for Dennis.  Hearing/Vision screen Hearing Screening - Comments:: Denies hearing  difficulties.  Vision Screening - Comments:: No rx glasses - up to date with [facility]    Goals Addressed             This Visit's Progress    Patient Stated       To stay healthy.       Depression Screen     03/29/2024   10:02 AM 01/30/2024   10:18 AM 10/02/2023   11:09 AM 07/17/2023    9:17 AM 01/03/2023   11:14 AM 10/21/2022    9:13 AM 06/19/2022    2:05 PM  PHQ 2/9 Scores  PHQ - 2 Score 0 0 0 0 0 0 0  PHQ- 9 Score 0 0 0 0 0  0    Fall Risk     03/29/2024   10:00 AM 10/02/2023   11:09 AM 10/21/2022    9:15 AM 11/25/2019    9:24 AM 10/01/2019   10:43 AM  Fall Risk   Falls in the past year? 0 1 0 0 0   Number falls in past yr: 0 0 0 0 0  Injury with Fall? 0 1 0  0  Risk for fall due to : No Fall Risks  No Fall Risks    Follow up Falls evaluation completed  Falls prevention discussed;Education provided;Falls evaluation completed Falls evaluation completed       Data saved with a previous flowsheet row definition    MEDICARE RISK AT HOME:  Medicare Risk at Home Any stairs in or around the home?: No If so, are there any without handrails?: No Home free of loose throw rugs in walkways, pet beds, electrical cords, etc?: Yes Adequate lighting in your home to reduce risk of falls?: Yes Life alert?: No Use of a cane, walker or w/c?: No Grab bars in the bathroom?: No Shower chair or bench in shower?:  No Elevated toilet seat or a handicapped toilet?: No  TIMED UP AND GO:  Was the test performed?  No  Cognitive Function: Declined/Normal: No cognitive concerns noted by patient or family. Patient alert, oriented, able to answer questions appropriately and recall recent events. No signs of memory loss or confusion.    03/29/2024   10:02 AM  MMSE - Mini Mental State Exam  Not completed: Unable to complete        03/29/2024   10:00 AM 10/21/2022    9:15 AM  6CIT Screen  What Year? 0 points 0 points  What month? 0 points 0 points  What time? 0 points  0 points  Count back from 20 0 points 0 points  Months in reverse 0 points 0 points  Repeat phrase 0 points 0 points  Total Score 0 points 0 points    Immunizations Immunization History  Administered Date(s) Administered   Influenza Split 03/04/2011, 03/24/2012   Influenza Whole 04/27/2009   Influenza, Seasonal, Injecte, Preservative Fre 04/25/2023   Influenza,inj,Quad PF,6+ Mos 02/19/2013, 02/22/2013, 06/03/2014, 03/11/2017, 02/17/2018, 02/17/2019, 04/17/2020, 03/18/2022   PFIZER Comirnaty(Gray Top)Covid-19 Tri-Sucrose Vaccine 07/09/2020   PFIZER(Purple Top)SARS-COV-2 Vaccination 12/07/2019, 12/18/2019   PNEUMOCOCCAL CONJUGATE-20 07/17/2023   Pfizer Covid-19 Vaccine Bivalent Booster 82yrs & up 05/31/2021   Pfizer(Comirnaty)Fall Seasonal Vaccine 12 years and older 04/24/2022, 04/25/2023   Pneumococcal Conjugate-13 06/05/2018   Td 09/19/2002   Tdap 07/16/2012, 07/17/2023    Screening Tests Health Maintenance  Topic Date Due   Zoster Vaccines- Shingrix  (1 of 2) Never done   OPHTHALMOLOGY EXAM  09/11/2014   Influenza Vaccine  01/16/2024   COVID-19 Vaccine (7 - 2025-26 season) 02/16/2024   HEMOGLOBIN A1C  08/01/2024   Diabetic kidney evaluation - eGFR measurement  10/01/2024   Cervical Cancer Screening (HPV/Pap Cotest)  11/24/2024   Colonoscopy  11/27/2024   Diabetic kidney evaluation - Urine ACR  01/29/2025   FOOT EXAM  01/29/2025   Medicare Annual Wellness (AWV)  03/29/2025   Mammogram  01/05/2026   DTaP/Tdap/Td (4 - Td or Tdap) 07/16/2033   Pneumococcal Vaccine: 50+ Years  Completed   Hepatitis C Screening  Completed   HIV Screening  Completed   Hepatitis B Vaccines 19-59 Average Risk  Aged Out   HPV VACCINES  Aged Out   Meningococcal B Vaccine  Aged Out    Health Maintenance Items Addressed: Yes Vaccines Due: Flu, Covid-19, Shingrix .  Additional Screening:  Vision Screening: Recommended annual ophthalmology exams for early detection of glaucoma and other disorders  of the eye. Is the patient up to date with their annual eye exam?  No  Who is the provider or what is the name of the office in which the patient attends annual eye exams? Defer to PCP  Dental Screening: Recommended annual dental exams for proper oral hygiene  Community Resource Referral / Chronic Care Management: CRR required this visit?  No   CCM required this visit?  No   Plan:    I have personally reviewed and noted the following in the patient's chart:   Medical and social history Use of alcohol, tobacco or illicit drugs  Current medications and supplements including opioid prescriptions. Patient is not currently taking opioid prescriptions. Functional ability and status Nutritional status Physical activity Advanced directives List of other physicians Hospitalizations, surgeries, and ER visits in previous 12 months Vitals Screenings to include cognitive, depression, and falls Referrals and appointments  In addition, I have reviewed and discussed with patient certain preventive protocols,  quality metrics, and best practice recommendations. A written personalized care plan for preventive services as well as general preventive health recommendations were provided to patient.   Roz LOISE Fuller, LPN   [id number]   After Visit Summary: (Declined) Due to this being a telephonic visit, with patients personalized plan was offered to patient but patient Declined AVS at this time   Notes: Patient stated that a nurse from [facility] did her eye exam during a home visit.

## 2024-03-29 NOTE — Patient Instructions (Signed)
 Ms. Bibby,  Thank you for taking the time for your Medicare Wellness Visit. I appreciate your continued commitment to your health goals. Please review the care plan we discussed, and feel free to reach out if I can assist you further.  Medicare recommends these wellness visits once per year to help you and your care team stay ahead of potential health issues. These visits are designed to focus on prevention, allowing your provider to concentrate on managing your acute and chronic conditions during your regular appointments.  Please note that Annual Wellness Visits do not include a physical exam. Some assessments may be limited, especially if the visit was conducted virtually. If needed, we may recommend a separate in-person follow-up with your provider.  Ongoing Care Seeing your primary care provider every 3 to 6 months helps us  monitor your health and provide consistent, personalized care.   Referrals If a referral was made during today's visit and you haven't received any updates within two weeks, please contact the referred provider directly to check on the status.  Recommended Screenings:  Health Maintenance  Topic Date Due   Zoster (Shingles) Vaccine (1 of 2) Never done   Eye exam for diabetics  09/11/2014   Flu Shot  01/16/2024   COVID-19 Vaccine (7 - 2025-26 season) 02/16/2024   Hemoglobin A1C  08/01/2024   Yearly kidney function blood test for diabetes  10/01/2024   Pap with HPV screening  11/24/2024   Colon Cancer Screening  11/27/2024   Yearly kidney health urinalysis for diabetes  01/29/2025   Complete foot exam   01/29/2025   Medicare Annual Wellness Visit  03/29/2025   Breast Cancer Screening  01/05/2026   DTaP/Tdap/Td vaccine (4 - Td or Tdap) 07/16/2033   Pneumococcal Vaccine for age over 80  Completed   Hepatitis C Screening  Completed   HIV Screening  Completed   Hepatitis B Vaccine  Aged Out   HPV Vaccine  Aged Out   Meningitis B Vaccine  Aged Out        03/29/2024   10:00 AM  Advanced Directives  Does Patient Have a Medical Advance Directive? No  Would patient like information on creating a medical advance directive? No - Patient declined   Advance Care Planning is important because it: Ensures you receive medical care that aligns with your values, goals, and preferences. Provides guidance to your family and loved ones, reducing the emotional burden of decision-making during critical moments.  Vision: Annual vision screenings are recommended for early detection of glaucoma, cataracts, and diabetic retinopathy. These exams can also reveal signs of chronic conditions such as diabetes and high blood pressure.  Dental: Annual dental screenings help detect early signs of oral cancer, gum disease, and other conditions linked to overall health, including heart disease and diabetes.  Please see the attached documents for additional preventive care recommendations.

## 2024-04-13 NOTE — Progress Notes (Signed)
 Wanda Baker                                          MRN: [id number]   04/13/2024   The VBCI Quality Team Specialist reviewed this patient medical record for the purposes of chart review for care gap closure. The following were reviewed: chart review for care gap closure-colorectal cancer screening.    VBCI Quality Team

## 2024-04-21 ENCOUNTER — Ambulatory Visit: Admitting: Family Medicine

## 2024-04-21 ENCOUNTER — Encounter: Payer: Self-pay | Admitting: Family Medicine

## 2024-04-21 VITALS — BP 136/78 | HR 71 | Ht 67.0 in | Wt 152.0 lb

## 2024-04-21 DIAGNOSIS — Z23 Encounter for immunization: Secondary | ICD-10-CM | POA: Diagnosis not present

## 2024-04-21 DIAGNOSIS — E119 Type 2 diabetes mellitus without complications: Secondary | ICD-10-CM | POA: Diagnosis not present

## 2024-04-21 DIAGNOSIS — I10 Essential (primary) hypertension: Secondary | ICD-10-CM

## 2024-04-21 LAB — POCT GLYCOSYLATED HEMOGLOBIN (HGB A1C): HbA1c, POC (controlled diabetic range): 6.2 % (ref 0.0–7.0)

## 2024-04-21 MED ORDER — SHINGRIX 50 MCG/0.5ML IM SUSR: INTRAMUSCULAR | 1 refills | Status: AC

## 2024-04-21 NOTE — Patient Instructions (Addendum)
 All good today  You received flu and covid shots  Continue current meds  Please see your eye doctor soon  Visit your pharmacy to get the shingles shot

## 2024-04-21 NOTE — Progress Notes (Signed)
    SUBJECTIVE:   CHIEF COMPLAINT / HPI:   T2DM -Current medication regimen: metformin  1000mg  daily, also on lipitor 40 and losartan  25 and gabapentin  400mg  tid prn -Home CBGs: n/a -Denies polyuria, polydipsia, abdominal pain, chest pain, shortness of breath -Foot exam: UTD -Eye exam: due, discussed  Lab Results  Component Value Date   HGBA1C 6.2 04/21/2024   HGBA1C 5.8 01/30/2024   HGBA1C 5.8 07/17/2023    PERTINENT  PMH / PSH: HTN, HLD  OBJECTIVE:   BP 136/78   Pulse 71   Ht 5' 7 (1.702 m)   Wt 152 lb (68.9 kg)   SpO2 100%   BMI 23.81 kg/m     General: NAD, pleasant, able to participate in exam Cardiac: RRR, no murmurs auscultated Respiratory: CTAB, normal WOB Abdomen: soft, non-tender, non-distended, normoactive bowel sounds Extremities: warm and well perfused, no edema or cyanosis Skin: warm and dry, no rashes noted Neuro: alert, no obvious focal deficits, speech normal Psych: Normal affect and mood  ASSESSMENT/PLAN:   Assessment & Plan Diabetes type 2, controlled (HCC) Hypertension, unspecified type Controlled on current regimen A1c 6.2 today F/u 40mo Continue metformin  and losartan   Flu and covid shots today Provided shingrix  rx  Payton Coward, MD [facility]

## 2024-04-21 NOTE — Assessment & Plan Note (Signed)
 Controlled on current regimen A1c 6.2 today F/u 59mo Continue metformin  and losartan 

## 2024-05-07 ENCOUNTER — Encounter: Payer: Self-pay | Admitting: Pharmacist

## 2024-05-07 NOTE — Progress Notes (Signed)
 This patient is appearing on a report for being at risk of failing the adherence measure for diabetes medications this calendar year.   Medication: metformin  500 mg  Last fill date: 04/22/24 for 90 day supply  Completed chart review.

## 2024-05-24 ENCOUNTER — Other Ambulatory Visit: Payer: Self-pay | Admitting: Family Medicine

## 2024-06-22 ENCOUNTER — Other Ambulatory Visit: Payer: Self-pay | Admitting: Family Medicine

## 2024-06-22 DIAGNOSIS — I10 Essential (primary) hypertension: Secondary | ICD-10-CM

## 2024-06-22 DIAGNOSIS — K219 Gastro-esophageal reflux disease without esophagitis: Secondary | ICD-10-CM

## 2024-06-28 NOTE — Progress Notes (Signed)
 Wanda Baker                                          MRN: [id number]   06/28/2024   The VBCI Quality Team Specialist reviewed this patient medical record for the purposes of chart review for care gap closure. The following were reviewed: abstraction for care gap closure-glycemic status assessment.    VBCI Quality Team

## 2024-07-15 ENCOUNTER — Telehealth: Payer: Self-pay | Admitting: Pharmacist

## 2024-07-15 NOTE — Telephone Encounter (Signed)
 Attempted to contact patient for follow-up of statin adherence quality measure.  Suggest lipid panel check up at next PCP visit  Unable to leave voice mail  Total time with patient call and documentation of interaction: 1 minutes.  Follow-up phone call planned: Not at this time

## 2024-07-20 ENCOUNTER — Other Ambulatory Visit: Payer: Self-pay | Admitting: Family Medicine

## 2024-07-20 DIAGNOSIS — E119 Type 2 diabetes mellitus without complications: Secondary | ICD-10-CM
